# Patient Record
Sex: Female | Born: 1945
Health system: Southern US, Community
[De-identification: ages and names within clinical notes are randomized; demographics above are authoritative.]

## PROBLEM LIST (undated history)

## (undated) DIAGNOSIS — F32A Depression, unspecified: Secondary | ICD-10-CM

## (undated) DIAGNOSIS — R29898 Other symptoms and signs involving the musculoskeletal system: Secondary | ICD-10-CM

## (undated) DIAGNOSIS — J45909 Unspecified asthma, uncomplicated: Secondary | ICD-10-CM

## (undated) DIAGNOSIS — M1990 Unspecified osteoarthritis, unspecified site: Secondary | ICD-10-CM

## (undated) DIAGNOSIS — I82409 Acute embolism and thrombosis of unspecified deep veins of unspecified lower extremity: Secondary | ICD-10-CM

## (undated) DIAGNOSIS — K219 Gastro-esophageal reflux disease without esophagitis: Secondary | ICD-10-CM

## (undated) DIAGNOSIS — C801 Malignant (primary) neoplasm, unspecified: Secondary | ICD-10-CM

## (undated) DIAGNOSIS — I10 Essential (primary) hypertension: Secondary | ICD-10-CM

## (undated) DIAGNOSIS — F329 Major depressive disorder, single episode, unspecified: Secondary | ICD-10-CM

## (undated) HISTORY — PX: FOOT SURGERY: SHX648

## (undated) HISTORY — DX: Essential (primary) hypertension: I10

## (undated) HISTORY — PX: PELVIC FRACTURE SURGERY: SHX119

## (undated) HISTORY — PX: CHOLECYSTECTOMY: SHX55

## (undated) HISTORY — DX: Major depressive disorder, single episode, unspecified: F32.9

## (undated) HISTORY — DX: Depression, unspecified: F32.A

## (undated) HISTORY — DX: Unspecified asthma, uncomplicated: J45.909

## (undated) HISTORY — DX: Malignant (primary) neoplasm, unspecified: C80.1

---

## 2008-08-13 ENCOUNTER — Ambulatory Visit: Payer: Self-pay | Admitting: Internal Medicine

## 2010-09-13 ENCOUNTER — Other Ambulatory Visit: Payer: Self-pay | Admitting: *Deleted

## 2013-01-02 DIAGNOSIS — M189 Osteoarthritis of first carpometacarpal joint, unspecified: Secondary | ICD-10-CM | POA: Insufficient documentation

## 2013-03-30 DIAGNOSIS — R0602 Shortness of breath: Secondary | ICD-10-CM | POA: Insufficient documentation

## 2013-03-30 DIAGNOSIS — C541 Malignant neoplasm of endometrium: Secondary | ICD-10-CM | POA: Insufficient documentation

## 2013-04-13 DIAGNOSIS — I10 Essential (primary) hypertension: Secondary | ICD-10-CM | POA: Insufficient documentation

## 2013-04-30 DIAGNOSIS — I82409 Acute embolism and thrombosis of unspecified deep veins of unspecified lower extremity: Secondary | ICD-10-CM

## 2013-04-30 HISTORY — DX: Acute embolism and thrombosis of unspecified deep veins of unspecified lower extremity: I82.409

## 2013-06-01 DIAGNOSIS — Z0001 Encounter for general adult medical examination with abnormal findings: Secondary | ICD-10-CM | POA: Insufficient documentation

## 2013-11-02 DIAGNOSIS — T451X5A Adverse effect of antineoplastic and immunosuppressive drugs, initial encounter: Secondary | ICD-10-CM

## 2013-11-02 DIAGNOSIS — G620 Drug-induced polyneuropathy: Secondary | ICD-10-CM | POA: Insufficient documentation

## 2013-12-28 DIAGNOSIS — G8929 Other chronic pain: Secondary | ICD-10-CM | POA: Insufficient documentation

## 2013-12-28 DIAGNOSIS — M5127 Other intervertebral disc displacement, lumbosacral region: Secondary | ICD-10-CM | POA: Insufficient documentation

## 2013-12-28 DIAGNOSIS — M5441 Lumbago with sciatica, right side: Secondary | ICD-10-CM

## 2013-12-28 DIAGNOSIS — M5442 Lumbago with sciatica, left side: Secondary | ICD-10-CM

## 2014-03-29 DIAGNOSIS — I82412 Acute embolism and thrombosis of left femoral vein: Secondary | ICD-10-CM | POA: Insufficient documentation

## 2014-04-22 ENCOUNTER — Ambulatory Visit: Payer: Self-pay | Admitting: Internal Medicine

## 2014-04-30 HISTORY — PX: ABCESS DRAINAGE: SHX399

## 2014-05-06 DIAGNOSIS — Z79899 Other long term (current) drug therapy: Secondary | ICD-10-CM | POA: Diagnosis not present

## 2014-05-06 DIAGNOSIS — I82402 Acute embolism and thrombosis of unspecified deep veins of left lower extremity: Secondary | ICD-10-CM | POA: Diagnosis not present

## 2014-05-06 DIAGNOSIS — Z8542 Personal history of malignant neoplasm of other parts of uterus: Secondary | ICD-10-CM | POA: Diagnosis not present

## 2014-05-06 DIAGNOSIS — R509 Fever, unspecified: Secondary | ICD-10-CM | POA: Diagnosis not present

## 2014-05-06 DIAGNOSIS — Z6841 Body Mass Index (BMI) 40.0 and over, adult: Secondary | ICD-10-CM | POA: Diagnosis not present

## 2014-05-06 DIAGNOSIS — F329 Major depressive disorder, single episode, unspecified: Secondary | ICD-10-CM | POA: Diagnosis not present

## 2014-05-06 DIAGNOSIS — R112 Nausea with vomiting, unspecified: Secondary | ICD-10-CM | POA: Diagnosis not present

## 2014-05-06 DIAGNOSIS — R109 Unspecified abdominal pain: Secondary | ICD-10-CM | POA: Diagnosis not present

## 2014-05-06 DIAGNOSIS — I10 Essential (primary) hypertension: Secondary | ICD-10-CM | POA: Diagnosis not present

## 2014-05-06 DIAGNOSIS — K5720 Diverticulitis of large intestine with perforation and abscess without bleeding: Secondary | ICD-10-CM | POA: Diagnosis not present

## 2014-05-06 DIAGNOSIS — Z4682 Encounter for fitting and adjustment of non-vascular catheter: Secondary | ICD-10-CM | POA: Diagnosis not present

## 2014-05-06 DIAGNOSIS — N739 Female pelvic inflammatory disease, unspecified: Secondary | ICD-10-CM | POA: Diagnosis not present

## 2014-05-06 DIAGNOSIS — K5780 Diverticulitis of intestine, part unspecified, with perforation and abscess without bleeding: Secondary | ICD-10-CM | POA: Diagnosis not present

## 2014-05-07 DIAGNOSIS — N739 Female pelvic inflammatory disease, unspecified: Secondary | ICD-10-CM | POA: Insufficient documentation

## 2014-05-13 DIAGNOSIS — I82402 Acute embolism and thrombosis of unspecified deep veins of left lower extremity: Secondary | ICD-10-CM | POA: Diagnosis not present

## 2014-05-13 DIAGNOSIS — N739 Female pelvic inflammatory disease, unspecified: Secondary | ICD-10-CM | POA: Diagnosis not present

## 2014-05-13 DIAGNOSIS — I10 Essential (primary) hypertension: Secondary | ICD-10-CM | POA: Diagnosis not present

## 2014-05-13 DIAGNOSIS — Z8542 Personal history of malignant neoplasm of other parts of uterus: Secondary | ICD-10-CM | POA: Diagnosis not present

## 2014-05-27 DIAGNOSIS — Z6841 Body Mass Index (BMI) 40.0 and over, adult: Secondary | ICD-10-CM | POA: Diagnosis not present

## 2014-05-27 DIAGNOSIS — K5720 Diverticulitis of large intestine with perforation and abscess without bleeding: Secondary | ICD-10-CM | POA: Diagnosis not present

## 2014-05-27 DIAGNOSIS — I10 Essential (primary) hypertension: Secondary | ICD-10-CM | POA: Diagnosis not present

## 2014-05-27 DIAGNOSIS — F329 Major depressive disorder, single episode, unspecified: Secondary | ICD-10-CM | POA: Diagnosis not present

## 2014-05-27 DIAGNOSIS — R9431 Abnormal electrocardiogram [ECG] [EKG]: Secondary | ICD-10-CM | POA: Diagnosis not present

## 2014-05-27 DIAGNOSIS — K5780 Diverticulitis of intestine, part unspecified, with perforation and abscess without bleeding: Secondary | ICD-10-CM | POA: Diagnosis not present

## 2014-05-27 DIAGNOSIS — Z4682 Encounter for fitting and adjustment of non-vascular catheter: Secondary | ICD-10-CM | POA: Diagnosis not present

## 2014-06-02 ENCOUNTER — Ambulatory Visit: Payer: Self-pay

## 2014-06-02 DIAGNOSIS — I10 Essential (primary) hypertension: Secondary | ICD-10-CM | POA: Diagnosis not present

## 2014-06-02 DIAGNOSIS — M79605 Pain in left leg: Secondary | ICD-10-CM | POA: Diagnosis not present

## 2014-06-02 DIAGNOSIS — I82402 Acute embolism and thrombosis of unspecified deep veins of left lower extremity: Secondary | ICD-10-CM | POA: Diagnosis not present

## 2014-06-02 DIAGNOSIS — I739 Peripheral vascular disease, unspecified: Secondary | ICD-10-CM | POA: Diagnosis not present

## 2014-06-02 DIAGNOSIS — M7989 Other specified soft tissue disorders: Secondary | ICD-10-CM | POA: Diagnosis not present

## 2014-06-02 DIAGNOSIS — M79662 Pain in left lower leg: Secondary | ICD-10-CM | POA: Diagnosis not present

## 2014-06-02 DIAGNOSIS — I80292 Phlebitis and thrombophlebitis of other deep vessels of left lower extremity: Secondary | ICD-10-CM | POA: Diagnosis not present

## 2014-06-02 DIAGNOSIS — C55 Malignant neoplasm of uterus, part unspecified: Secondary | ICD-10-CM | POA: Diagnosis not present

## 2014-06-10 DIAGNOSIS — R600 Localized edema: Secondary | ICD-10-CM | POA: Diagnosis not present

## 2014-06-10 DIAGNOSIS — M79605 Pain in left leg: Secondary | ICD-10-CM | POA: Diagnosis not present

## 2014-06-24 DIAGNOSIS — C55 Malignant neoplasm of uterus, part unspecified: Secondary | ICD-10-CM | POA: Diagnosis not present

## 2014-06-24 DIAGNOSIS — R600 Localized edema: Secondary | ICD-10-CM | POA: Diagnosis not present

## 2014-06-24 DIAGNOSIS — M79605 Pain in left leg: Secondary | ICD-10-CM | POA: Diagnosis not present

## 2014-06-24 DIAGNOSIS — M792 Neuralgia and neuritis, unspecified: Secondary | ICD-10-CM | POA: Diagnosis not present

## 2014-06-24 DIAGNOSIS — I10 Essential (primary) hypertension: Secondary | ICD-10-CM | POA: Diagnosis not present

## 2014-06-24 DIAGNOSIS — I82402 Acute embolism and thrombosis of unspecified deep veins of left lower extremity: Secondary | ICD-10-CM | POA: Diagnosis not present

## 2014-07-09 DIAGNOSIS — Z8542 Personal history of malignant neoplasm of other parts of uterus: Secondary | ICD-10-CM | POA: Diagnosis not present

## 2014-07-09 DIAGNOSIS — C541 Malignant neoplasm of endometrium: Secondary | ICD-10-CM | POA: Diagnosis not present

## 2014-07-09 DIAGNOSIS — Z483 Aftercare following surgery for neoplasm: Secondary | ICD-10-CM | POA: Diagnosis not present

## 2014-08-23 DIAGNOSIS — I80292 Phlebitis and thrombophlebitis of other deep vessels of left lower extremity: Secondary | ICD-10-CM | POA: Diagnosis not present

## 2014-08-23 DIAGNOSIS — R109 Unspecified abdominal pain: Secondary | ICD-10-CM | POA: Diagnosis not present

## 2014-08-23 DIAGNOSIS — K5732 Diverticulitis of large intestine without perforation or abscess without bleeding: Secondary | ICD-10-CM | POA: Diagnosis not present

## 2014-08-23 DIAGNOSIS — I10 Essential (primary) hypertension: Secondary | ICD-10-CM | POA: Diagnosis not present

## 2014-08-23 DIAGNOSIS — C55 Malignant neoplasm of uterus, part unspecified: Secondary | ICD-10-CM | POA: Diagnosis not present

## 2014-08-31 DIAGNOSIS — D125 Benign neoplasm of sigmoid colon: Secondary | ICD-10-CM | POA: Diagnosis not present

## 2014-08-31 DIAGNOSIS — F329 Major depressive disorder, single episode, unspecified: Secondary | ICD-10-CM | POA: Diagnosis not present

## 2014-08-31 DIAGNOSIS — Z1211 Encounter for screening for malignant neoplasm of colon: Secondary | ICD-10-CM | POA: Diagnosis not present

## 2014-08-31 DIAGNOSIS — Z6841 Body Mass Index (BMI) 40.0 and over, adult: Secondary | ICD-10-CM | POA: Diagnosis not present

## 2014-08-31 DIAGNOSIS — K219 Gastro-esophageal reflux disease without esophagitis: Secondary | ICD-10-CM | POA: Diagnosis not present

## 2014-08-31 DIAGNOSIS — K5730 Diverticulosis of large intestine without perforation or abscess without bleeding: Secondary | ICD-10-CM | POA: Diagnosis not present

## 2014-08-31 DIAGNOSIS — I10 Essential (primary) hypertension: Secondary | ICD-10-CM | POA: Diagnosis not present

## 2014-08-31 DIAGNOSIS — R0602 Shortness of breath: Secondary | ICD-10-CM | POA: Diagnosis not present

## 2014-08-31 DIAGNOSIS — D369 Benign neoplasm, unspecified site: Secondary | ICD-10-CM | POA: Diagnosis not present

## 2014-09-01 DIAGNOSIS — M40205 Unspecified kyphosis, thoracolumbar region: Secondary | ICD-10-CM | POA: Diagnosis not present

## 2014-09-01 DIAGNOSIS — M8580 Other specified disorders of bone density and structure, unspecified site: Secondary | ICD-10-CM | POA: Diagnosis not present

## 2014-09-01 DIAGNOSIS — R05 Cough: Secondary | ICD-10-CM | POA: Diagnosis not present

## 2014-09-03 DIAGNOSIS — K5732 Diverticulitis of large intestine without perforation or abscess without bleeding: Secondary | ICD-10-CM | POA: Diagnosis not present

## 2014-09-03 DIAGNOSIS — I10 Essential (primary) hypertension: Secondary | ICD-10-CM | POA: Diagnosis not present

## 2014-09-03 DIAGNOSIS — R0602 Shortness of breath: Secondary | ICD-10-CM | POA: Diagnosis not present

## 2014-09-03 DIAGNOSIS — J189 Pneumonia, unspecified organism: Secondary | ICD-10-CM | POA: Diagnosis not present

## 2014-09-03 DIAGNOSIS — R05 Cough: Secondary | ICD-10-CM | POA: Diagnosis not present

## 2014-10-08 DIAGNOSIS — Z452 Encounter for adjustment and management of vascular access device: Secondary | ICD-10-CM | POA: Diagnosis not present

## 2014-10-08 DIAGNOSIS — C541 Malignant neoplasm of endometrium: Secondary | ICD-10-CM | POA: Diagnosis not present

## 2014-10-11 DIAGNOSIS — R10814 Left lower quadrant abdominal tenderness: Secondary | ICD-10-CM | POA: Diagnosis not present

## 2014-10-11 DIAGNOSIS — K5732 Diverticulitis of large intestine without perforation or abscess without bleeding: Secondary | ICD-10-CM | POA: Diagnosis not present

## 2014-10-11 DIAGNOSIS — I82402 Acute embolism and thrombosis of unspecified deep veins of left lower extremity: Secondary | ICD-10-CM | POA: Diagnosis not present

## 2014-10-11 DIAGNOSIS — R7301 Impaired fasting glucose: Secondary | ICD-10-CM | POA: Diagnosis not present

## 2014-10-11 DIAGNOSIS — F411 Generalized anxiety disorder: Secondary | ICD-10-CM | POA: Diagnosis not present

## 2014-10-11 DIAGNOSIS — I10 Essential (primary) hypertension: Secondary | ICD-10-CM | POA: Diagnosis not present

## 2014-11-15 DIAGNOSIS — C541 Malignant neoplasm of endometrium: Secondary | ICD-10-CM | POA: Diagnosis not present

## 2014-11-15 DIAGNOSIS — T451X5A Adverse effect of antineoplastic and immunosuppressive drugs, initial encounter: Secondary | ICD-10-CM | POA: Diagnosis not present

## 2014-11-15 DIAGNOSIS — G620 Drug-induced polyneuropathy: Secondary | ICD-10-CM | POA: Diagnosis not present

## 2014-11-15 DIAGNOSIS — N739 Female pelvic inflammatory disease, unspecified: Secondary | ICD-10-CM | POA: Diagnosis not present

## 2014-11-15 DIAGNOSIS — Z79899 Other long term (current) drug therapy: Secondary | ICD-10-CM | POA: Diagnosis not present

## 2014-11-19 DIAGNOSIS — Z9049 Acquired absence of other specified parts of digestive tract: Secondary | ICD-10-CM | POA: Diagnosis not present

## 2014-11-19 DIAGNOSIS — R1032 Left lower quadrant pain: Secondary | ICD-10-CM | POA: Diagnosis not present

## 2014-11-19 DIAGNOSIS — Z90710 Acquired absence of both cervix and uterus: Secondary | ICD-10-CM | POA: Diagnosis not present

## 2014-11-19 DIAGNOSIS — M479 Spondylosis, unspecified: Secondary | ICD-10-CM | POA: Diagnosis not present

## 2014-11-19 DIAGNOSIS — K429 Umbilical hernia without obstruction or gangrene: Secondary | ICD-10-CM | POA: Diagnosis not present

## 2014-11-19 DIAGNOSIS — C541 Malignant neoplasm of endometrium: Secondary | ICD-10-CM | POA: Diagnosis not present

## 2014-11-19 DIAGNOSIS — N200 Calculus of kidney: Secondary | ICD-10-CM | POA: Diagnosis not present

## 2014-11-19 DIAGNOSIS — N281 Cyst of kidney, acquired: Secondary | ICD-10-CM | POA: Diagnosis not present

## 2015-01-13 ENCOUNTER — Other Ambulatory Visit: Payer: Self-pay | Admitting: Nurse Practitioner

## 2015-01-13 ENCOUNTER — Ambulatory Visit
Admission: RE | Admit: 2015-01-13 | Discharge: 2015-01-13 | Disposition: A | Discharge status: Home / Self Care | Payer: Commercial Managed Care - HMO | Source: Ambulatory Visit | Attending: Nurse Practitioner | Admitting: Nurse Practitioner

## 2015-01-13 DIAGNOSIS — C55 Malignant neoplasm of uterus, part unspecified: Secondary | ICD-10-CM | POA: Diagnosis not present

## 2015-01-13 DIAGNOSIS — R0602 Shortness of breath: Secondary | ICD-10-CM | POA: Diagnosis not present

## 2015-01-13 DIAGNOSIS — R0781 Pleurodynia: Secondary | ICD-10-CM

## 2015-01-13 DIAGNOSIS — M25511 Pain in right shoulder: Secondary | ICD-10-CM

## 2015-01-13 DIAGNOSIS — S2341XA Sprain of ribs, initial encounter: Secondary | ICD-10-CM | POA: Diagnosis not present

## 2015-01-13 DIAGNOSIS — M791 Myalgia: Secondary | ICD-10-CM | POA: Diagnosis not present

## 2015-01-13 DIAGNOSIS — Z043 Encounter for examination and observation following other accident: Secondary | ICD-10-CM | POA: Diagnosis not present

## 2015-01-13 DIAGNOSIS — I10 Essential (primary) hypertension: Secondary | ICD-10-CM | POA: Diagnosis not present

## 2015-01-13 DIAGNOSIS — R079 Chest pain, unspecified: Secondary | ICD-10-CM | POA: Diagnosis not present

## 2015-01-13 DIAGNOSIS — R0789 Other chest pain: Secondary | ICD-10-CM | POA: Insufficient documentation

## 2015-01-21 DIAGNOSIS — C541 Malignant neoplasm of endometrium: Secondary | ICD-10-CM | POA: Diagnosis not present

## 2015-01-21 DIAGNOSIS — Z8542 Personal history of malignant neoplasm of other parts of uterus: Secondary | ICD-10-CM | POA: Diagnosis not present

## 2015-01-21 DIAGNOSIS — Z08 Encounter for follow-up examination after completed treatment for malignant neoplasm: Secondary | ICD-10-CM | POA: Diagnosis not present

## 2015-01-21 DIAGNOSIS — Z9221 Personal history of antineoplastic chemotherapy: Secondary | ICD-10-CM | POA: Diagnosis not present

## 2015-01-21 DIAGNOSIS — Z923 Personal history of irradiation: Secondary | ICD-10-CM | POA: Diagnosis not present

## 2015-01-21 DIAGNOSIS — K5792 Diverticulitis of intestine, part unspecified, without perforation or abscess without bleeding: Secondary | ICD-10-CM | POA: Diagnosis not present

## 2015-03-07 ENCOUNTER — Other Ambulatory Visit: Payer: Self-pay | Admitting: Nurse Practitioner

## 2015-03-07 DIAGNOSIS — I80292 Phlebitis and thrombophlebitis of other deep vessels of left lower extremity: Secondary | ICD-10-CM | POA: Diagnosis not present

## 2015-03-07 DIAGNOSIS — F411 Generalized anxiety disorder: Secondary | ICD-10-CM | POA: Diagnosis not present

## 2015-03-07 DIAGNOSIS — Z0001 Encounter for general adult medical examination with abnormal findings: Secondary | ICD-10-CM | POA: Diagnosis not present

## 2015-03-07 DIAGNOSIS — F329 Major depressive disorder, single episode, unspecified: Secondary | ICD-10-CM | POA: Diagnosis not present

## 2015-03-07 DIAGNOSIS — M545 Low back pain: Secondary | ICD-10-CM | POA: Diagnosis not present

## 2015-03-07 DIAGNOSIS — M7989 Other specified soft tissue disorders: Secondary | ICD-10-CM

## 2015-03-07 DIAGNOSIS — M79605 Pain in left leg: Secondary | ICD-10-CM

## 2015-03-07 DIAGNOSIS — Z0000 Encounter for general adult medical examination without abnormal findings: Secondary | ICD-10-CM | POA: Diagnosis not present

## 2015-03-09 ENCOUNTER — Other Ambulatory Visit: Payer: Self-pay | Admitting: Nurse Practitioner

## 2015-03-09 ENCOUNTER — Ambulatory Visit
Admission: RE | Admit: 2015-03-09 | Discharge: 2015-03-09 | Disposition: A | Discharge status: Home / Self Care | Payer: Commercial Managed Care - HMO | Source: Ambulatory Visit | Attending: Nurse Practitioner | Admitting: Nurse Practitioner

## 2015-03-09 ENCOUNTER — Ambulatory Visit
Admission: RE | Admit: 2015-03-09 | Payer: Commercial Managed Care - HMO | Source: Ambulatory Visit | Admitting: Nurse Practitioner

## 2015-03-09 DIAGNOSIS — M25552 Pain in left hip: Secondary | ICD-10-CM | POA: Diagnosis not present

## 2015-03-09 DIAGNOSIS — M8580 Other specified disorders of bone density and structure, unspecified site: Secondary | ICD-10-CM | POA: Diagnosis not present

## 2015-03-09 DIAGNOSIS — M545 Low back pain: Secondary | ICD-10-CM

## 2015-03-09 DIAGNOSIS — M47816 Spondylosis without myelopathy or radiculopathy, lumbar region: Secondary | ICD-10-CM | POA: Insufficient documentation

## 2015-03-09 DIAGNOSIS — M5136 Other intervertebral disc degeneration, lumbar region: Secondary | ICD-10-CM | POA: Diagnosis not present

## 2015-03-10 ENCOUNTER — Ambulatory Visit
Admission: RE | Admit: 2015-03-10 | Discharge: 2015-03-10 | Disposition: A | Discharge status: Home / Self Care | Payer: Commercial Managed Care - HMO | Source: Ambulatory Visit | Attending: Nurse Practitioner | Admitting: Nurse Practitioner

## 2015-03-10 DIAGNOSIS — M7989 Other specified soft tissue disorders: Secondary | ICD-10-CM | POA: Diagnosis not present

## 2015-03-10 DIAGNOSIS — M79605 Pain in left leg: Secondary | ICD-10-CM | POA: Insufficient documentation

## 2015-03-16 ENCOUNTER — Other Ambulatory Visit: Payer: Self-pay | Admitting: Internal Medicine

## 2015-03-16 DIAGNOSIS — M25552 Pain in left hip: Secondary | ICD-10-CM

## 2015-04-06 ENCOUNTER — Ambulatory Visit
Admission: RE | Admit: 2015-04-06 | Discharge: 2015-04-06 | Disposition: A | Discharge status: Home / Self Care | Payer: Commercial Managed Care - HMO | Source: Ambulatory Visit | Attending: Internal Medicine | Admitting: Internal Medicine

## 2015-04-06 DIAGNOSIS — M25552 Pain in left hip: Secondary | ICD-10-CM | POA: Diagnosis not present

## 2015-04-06 DIAGNOSIS — M899 Disorder of bone, unspecified: Secondary | ICD-10-CM | POA: Diagnosis not present

## 2015-04-06 DIAGNOSIS — M461 Sacroiliitis, not elsewhere classified: Secondary | ICD-10-CM | POA: Insufficient documentation

## 2015-04-21 DIAGNOSIS — M25552 Pain in left hip: Secondary | ICD-10-CM | POA: Diagnosis not present

## 2015-04-21 DIAGNOSIS — M461 Sacroiliitis, not elsewhere classified: Secondary | ICD-10-CM | POA: Diagnosis not present

## 2015-04-21 DIAGNOSIS — M7062 Trochanteric bursitis, left hip: Secondary | ICD-10-CM | POA: Diagnosis not present

## 2015-04-29 DIAGNOSIS — M899 Disorder of bone, unspecified: Secondary | ICD-10-CM | POA: Diagnosis not present

## 2015-04-29 DIAGNOSIS — M461 Sacroiliitis, not elsewhere classified: Secondary | ICD-10-CM | POA: Diagnosis not present

## 2015-05-12 DIAGNOSIS — N390 Urinary tract infection, site not specified: Secondary | ICD-10-CM | POA: Diagnosis not present

## 2015-05-12 DIAGNOSIS — I10 Essential (primary) hypertension: Secondary | ICD-10-CM | POA: Diagnosis not present

## 2015-05-12 DIAGNOSIS — F329 Major depressive disorder, single episode, unspecified: Secondary | ICD-10-CM | POA: Diagnosis not present

## 2015-05-12 DIAGNOSIS — M545 Low back pain: Secondary | ICD-10-CM | POA: Diagnosis not present

## 2015-05-13 ENCOUNTER — Other Ambulatory Visit: Payer: Self-pay | Admitting: *Deleted
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
## 2015-05-17 DIAGNOSIS — M545 Low back pain: Secondary | ICD-10-CM | POA: Diagnosis not present

## 2015-05-17 DIAGNOSIS — M79605 Pain in left leg: Secondary | ICD-10-CM | POA: Diagnosis not present

## 2015-05-17 DIAGNOSIS — N393 Stress incontinence (female) (male): Secondary | ICD-10-CM | POA: Diagnosis not present

## 2015-05-17 DIAGNOSIS — M461 Sacroiliitis, not elsewhere classified: Secondary | ICD-10-CM | POA: Diagnosis not present

## 2015-05-17 DIAGNOSIS — G8929 Other chronic pain: Secondary | ICD-10-CM | POA: Diagnosis not present

## 2015-05-18 ENCOUNTER — Other Ambulatory Visit: Payer: Self-pay | Admitting: *Deleted

## 2015-05-18 NOTE — Patient Outreach (Signed)
[place] [facility]) Care Management  05/18/2015  Teree Frankenberg 08-22-1945 [id number]  Subjective: Telephone to patient's home number, spoke with patient, and HIPAA verified.   States she is currently at the pharmacy picking up some medication and requested call back tomorrow.  Objective: Per Epic case review: Patient has not had any inpatient admits or ED visits in 2016.   Assessment: Referral received on 05/12/15. Referral source: [name] at [facility]. States patient has chronic pain and depression. Patient with recent grief and loss due to husband's death in March 10, 2015. States the patient reports that she is confused about her options for care, risks and benefits. Patient wants to manage pain and participate in PT. Patient has barriers and does not know what to do. Services requested: [place]. Patient in agreement to continue to receive [place] Management services.   Plan: RNCM will call patient back within 3 business days to initiate case management assessment (review pain management, follow up on pain management MD appointment and review medications).    Arline Ketter H. Annia Friendly, BSN, [place] Management [facility] Telephonic CM Phone: [phone] Fax: [phone]

## 2015-05-19 ENCOUNTER — Ambulatory Visit: Payer: Self-pay | Admitting: *Deleted

## 2015-05-20 ENCOUNTER — Other Ambulatory Visit: Payer: Self-pay | Admitting: *Deleted

## 2015-05-20 NOTE — Patient Outreach (Signed)
[place] [facility]) Care Management  05/20/2015  Diana Bernard November 09, 1945 [id number]   Subjective: Telephone call to patient's home phone number, spoke with patient, and HIPAA verified.   Patient states she is doing ok today and having a better day than yesterday.   Patient states her visit with the pain management MD went ok and she is willing to try the MD's suggestions.  States her lower back still hurts and can reach an 8 pain rating at times.   Patient states pain is relieved with pain medications, limiting her body movement, and bending at her knees.  States pain management MD has also prescribed some pain cream and patches (TENF).  Patient states she could only afford to fill half of the pain cream prescription for now.  States she does not want to take any stronger pain medicines because she does not want get addicted.   RNCM advised patient of pain management strategies, treating pain early, and maintaining pain schedule based on pain rating.   Patient voiced understanding and states the pain medication does not last long.  Patient states she is comfortable with her pain treatment regimen and will continue to evaluate her pain management. Patient states she is only able to walk short distances. States she has an appointment in March 2017 with a pelvic therapist and her gynecologist.  Patient states she is getting disgusted with her MDs because they kept telling her something different.   RNCM encouraged patient to continue to seek understanding, clarification and how this impacts health outcomes.   Patient states she would like to receive information on Advanced Directives.  Patient in agreement to continue to receive [place] Management services.    Objective: Per Epic case review: Patient has not had any inpatient admits or ED visits in 2016.   Assessment: Referral received on 05/12/15. Referral source: [name] at [facility]. States patient has  chronic pain and depression. Patient with recent grief and loss due to husband's death in 2015/03/13. States the patient reports that she is confused about her options for care, risks and benefits. Patient wants to manage pain and participate in PT. Patient has barriers and does not know what to do. Services requested: [place]. Patient in agreement to continue to receive [place] Management services.   Plan: RNCM will call patient back within 2 weeks to continue case management assessment (review  medications, medical history, tobacco/ smoking, falls/ depression and functional status). RNCM will send patient information on Advanced Directives within 2 weeks and follow up on receipt.    Zion Ta H. Annia Friendly, BSN, [place] Management [facility] Telephonic CM Phone: [phone] Fax: [phone]

## 2015-05-31 ENCOUNTER — Other Ambulatory Visit: Payer: Self-pay | Admitting: *Deleted

## 2015-06-01 ENCOUNTER — Encounter: Payer: Self-pay | Admitting: *Deleted

## 2015-06-01 NOTE — Patient Outreach (Signed)
Diana Bernard) Care Management  [place]  05/31/15  Diana Bernard 05-26-1945 [id number]  Subjective:  Telephone call to patient's home number, spoke with patient and HIPAA verified.   Patient states she is doing ok.  States her back and stomach are still hurting.   Patient states the pain never completely goes away.  States she is managing the pain with alternating pain medications between Tramadol and Ibuprofen.   States does not want to try any stronger medications at this time, is ok with current pain management regimen,  and is getting approximately 4 hours of relief with the current regimen.   RNCM encouraged patient to obtain sooner pain management MD follow up appointment if pain increased or does not decrease.   States the pain is worse when sitting or driving for long periods of time.  RNCM encouraged patient to increase mobility and change positions often.   Patient voiced understanding and is in agreement with RNCM's suggestions.  Patient states her next appointment with pain management MD (Dr. Mike Gip [facility])  is  06/21/15 at [facility] .   Patient feels her pain in stomach may be due to a left side stomach abscess drain that was placed and removed in July of 2016.   Patient states she is planning to schedule a sooner appointment with her oncologist Dr. Benard Halsted at [facility] for follow up on back and stomach pain.  Patient states she wants to verify with Dr. Benard Halsted that her cancer is still in remission.  Patient states she is also feeling very overwhelmed and disorganized due to recent life events (husband died recently).   States she missed her MRI appointment and has rescheduled it for 06/08/15 at 1:15pm because she forgot about it.   Patient in agreement to receive [facility] patient calendar to assist with organization of medical appointments.   Patient states she has had some bladder leakage in the past and is planning to discuss with MD on next office  visit.   Patient states she is currently managing this issue.  RNCM educated patient on possible benefits of kegel exercises with this issue.  Patient is in agreement to continue to receive [place] Management services.    Objective: Per Epic case review: Patient has not had any inpatient admits or ED visits in 2016.   Current Medications:  No current outpatient prescriptions on file.   No current facility-administered medications for this visit.    Functional Status:  In your present state of health, do you have any difficulty performing the following activities: 06/01/2015 05/31/2015  Hearing? - N  Vision? Y Y  Difficulty concentrating or making decisions? - Y  Walking or climbing stairs? - Y  Dressing or bathing? - -  Doing errands, shopping? - Facilities manager and eating ? - N  Using the Toilet? - N  In the past six months, have you accidently leaked urine? Y Y  Do you have problems with loss of bowel control? - N  Managing your Medications? - -  Managing your Finances? - -  Housekeeping or managing your Housekeeping? - -    Fall/Depression Screening: PHQ 2/9 Scores 05/31/2015  PHQ - 2 Score 1   Fall Risk  05/31/2015  Falls in the past year? Yes  Number falls in past yr: 1  Injury with Fall? No  Risk for fall due to : Impaired mobility  Follow up Education provided    [facility] CM Care Plan Problem  One        Most Recent Value   Care Plan Problem One  Patient is experiencing chronic back and stomach pain.    Role Documenting the Problem One  Care Management Telephonic [place] for Problem One  Active   THN Long Term Goal (31-90 days)  Patient will not have any inpatient admissions for pain management within  the next 31 days.    THN Long Term Goal Start Date  05/31/15   Interventions for Problem One Long Term Goal  RNCM educated patient on pain management strategies.     THN CM Short Term Goal #1 (0-30 days)  Patient will attend pain management follow up MD  appointment within 30 days.    THN CM Short Term Goal #1 Start Date  05/31/15   Interventions for Short Term Goal #1  RNCM educated patient to schedule sooner follow up appointment with pain management MD if pain increasing and / or does not decrease within  2 weeks.    THN CM Short Term Goal #2 (0-30 days)  Patient will schedule follow up appointment with oncologist  within the next 30 days.    THN CM Short Term Goal #2 Start Date  05/31/15   Interventions for Short Term Goal #2  RNCM encouraged patient to follow up with oncologist regarding increased back and stomach pain.      THN CM Care Plan Problem Two        Most Recent Value   Care Plan Problem Two  Patient is feeling disorganized, overwhelmed and missed recent medical follow up appointment.    Role Documenting the Problem Two  Care Management Telephonic Coordinator   Care Plan for Problem Two  Active   THN CM Short Term Goal #1 (0-30 days)  Patient will verbalize that is she is feeling less overwhelmed and more organized with following up on MD appointments within the next 30 days.   THN CM Short Term Goal #1 Start Date  05/31/15   Interventions for Short Term Goal #2   RNCM educated patient on [place] Management resources and will send [facility] patient calendar.        Assessment: Referral received on 05/12/15. Referral source: [name] at [facility]. States patient has chronic pain and depression. Patient with recent grief and loss due to husband's death in March 23, 2015. States the patient reports that she is confused about her options for care, risks and benefits. Patient wants to manage pain and participate in PT. Patient has barriers and does not know what to do. Services requested: [place]. Patient in agreement to continue to receive [place] Management services.  Patient will be followed by telephonic  [facility] RNCM for pain management care coordination, education and community resources.    Plan: RNCM will send MD barrier letter, patient's care plan and initial assessment to patient's primary MD.  [facility] will send patient information on Advanced Directives, Welcome Packet, within 1 week and follow up on receipt.  RNCM will call patient back within 1 week to continue case management assessment (review medications, medical history, tobacco/ smoking, and nutrition).  RNCM will follow up with patient to assess need for pain medication (cream) financial resource within 1 week.   RNCM will follow up with patient regarding upcoming pain management appointment and MRI results within 3 weeks. RNCM will send patient information on Advanced Directives within 2 weeks and follow up on receipt.    Jakyra Kenealy  Morton Amy RN, BSN, [place] Telephonic CM Phone: [phone] Fax: [phone]

## 2015-06-03 ENCOUNTER — Encounter: Payer: Self-pay | Admitting: *Deleted

## 2015-06-03 NOTE — Progress Notes (Signed)
This encounter was created in error - please disregard.

## 2015-06-07 ENCOUNTER — Encounter: Payer: Self-pay | Admitting: *Deleted

## 2015-06-07 ENCOUNTER — Other Ambulatory Visit: Payer: Self-pay | Admitting: *Deleted

## 2015-06-07 VITALS — Wt 240.0 lb

## 2015-06-07 DIAGNOSIS — M47819 Spondylosis without myelopathy or radiculopathy, site unspecified: Secondary | ICD-10-CM | POA: Diagnosis not present

## 2015-06-07 DIAGNOSIS — M47816 Spondylosis without myelopathy or radiculopathy, lumbar region: Secondary | ICD-10-CM | POA: Diagnosis not present

## 2015-06-07 DIAGNOSIS — M4856XA Collapsed vertebra, not elsewhere classified, lumbar region, initial encounter for fracture: Secondary | ICD-10-CM | POA: Diagnosis not present

## 2015-06-07 DIAGNOSIS — R1012 Left upper quadrant pain: Secondary | ICD-10-CM

## 2015-06-07 NOTE — Patient Outreach (Addendum)
[place] [facility]) Care Management  [place]  06/07/2015   Diana Bernard July 19, 1945 [id number]  Subjective:  Telephone call to patient, spoke with patient, and HIPAA verified.   States she is doing pretty good today and much better than yesterday.   States she had an emotional day yesterday dealing with her late husband's estate.  States she had stop taking her Prozac daily recently because is was making her too drowsy.   States she resumed taking it today as prescribed.   RNCM educated patient on medication adherence, importance of antidepressant tapering, and to report any side effects to the MD.   Patient in agreement and states she will follow up with the MD in the future instead of making self medication adjustments.   Patient states her back pain is about the same, 5 pain rating, and is learning to identify pain triggers.  States lifting things like a case of water, causes increase pain.   States she has modified her lifting to buying only  gallon containers of water and having others to lift the case of water for her if needed.   RNCM educated patient on strategies to use once pain triggers identified, modification to lifting and monitor her activity endurance.  Patient voices understanding and is in agreement to try strategies.   Patient states she is working through the pain, dealing with it the best that she can, is ok with current pain management regimen, and does not want schedule a sooner pain management MD appointment at this time.  States she will continue monitor the pain and contact MD if the pain get worse.   Patient had questions regarding the meaning of degeneration of spine and pelvis.  RNCM educated patient on the general meaning of degeneration and advised patient to follow up with MD regarding her specific diagnosis.  Patient voiced understanding and is in agreement.   Patient states she has her MRI today @ 1:15pm and will update RNCM on outcome at next  telephone outreach.    Patient states she has received [facility] Welcome packet and calendar.  States she feels the calendar is going to be very helpful and help her to get more organized.   States she will also verify to see if she received the [facility] Consent form, complete  Patient states she has been having more leg cramps and recently started back on magnesium.   Patient is taking approximately 12 medications and is in agreement to [place] referral for medication review and education.    States she is currently using [facility] order pharmacy for maintenance medications.   States the lidocaine gel is very expensive and she purchased the smallest quantity.   States she may need financial assistance with the medication in the future if she is on it longterm.   Patient will advise RNCM if community resources or financial assistance needed for this medication in the future.    Patient in agreement to continue to receive [place] Management services and EMMI educational materials.  Objective:   Current Medications:  Current Outpatient Prescriptions  Medication Sig Dispense Refill  . ALPRAZolam (XANAX) 0.25 MG tablet Take 0.25 mg by mouth.    [name] amLODipine (NORVASC) 10 MG tablet 10 mg.    . aspirin EC 325 MG tablet Take 325 mg by mouth.    . docusate sodium (STOOL SOFTENER) 100 MG capsule Take 100 mg by mouth.    [name] FLUoxetine (PROZAC) 40 MG capsule Take 40 mg  by mouth.    . furosemide (LASIX) 20 MG tablet     . gabapentin (NEURONTIN) 300 MG capsule 2 tablets at night and 1 tablet in the morning and at midday    . ibuprofen (ADVIL,MOTRIN) 800 MG tablet Take 800 mg by mouth.    . Lidocaine HCl 3 % GEL Apply a small amount to affected areas up to three times per day    . Magnesium 250 MG TABS Take by mouth.    . Potassium 99 MG TABS Take by mouth.    . traMADol (ULTRAM) 50 MG tablet Take 50 mg by mouth.    . magnesium oxide (MAG-OX) 400 MG tablet Take 400 mg by mouth. Reported on 06/07/2015     No  current facility-administered medications for this visit.    Functional Status:  In your present state of health, do you have any difficulty performing the following activities: 06/01/2015 05/31/2015  Hearing? - N  Vision? Y Y  Difficulty concentrating or making decisions? - Y  Walking or climbing stairs? - Y  Dressing or bathing? - -  Doing errands, shopping? - Facilities manager and eating ? - N  Using the Toilet? - N  In the past six months, have you accidently leaked urine? Y Y  Do you have problems with loss of bowel control? - N  Managing your Medications? - -  Managing your Finances? - -  Housekeeping or managing your Housekeeping? - -    Fall/Depression Screening: PHQ 2/9 Scores 05/31/2015  PHQ - 2 Score 1   Fall Risk  05/31/2015  Falls in the past year? Yes  Number falls in past yr: 1  Injury with Fall? No  Risk for fall due to : Impaired mobility  Follow up Education provided   [facility] CM Care Plan Problem One        Most Recent Value   Care Plan Problem One  Patient is experiencing chronic back and stomach pain.    Role Documenting the Problem One  Care Management Telephonic [place] for Problem One  Active   THN Long Term Goal (31-90 days)  Patient will not have any inpatient admissions for pain management within  the next 31 days.    THN Long Term Goal Start Date  05/31/15   Interventions for Problem One Long Term Goal  RNCM educated patient on documenting pain triggers.     THN CM Short Term Goal #1 (0-30 days)  Patient will attend pain management follow up MD appointment within 30 days.    THN CM Short Term Goal #1 Start Date  05/31/15   Interventions for Short Term Goal #1  RNCM educated patient to schedule sooner follow up appointment with pain management MD if pain increasing and / or does not decrease within  2 weeks.  [Patient's next MD appt. is 06/21/15.]   THN CM Short Term Goal #2 (0-30 days)  Patient will schedule follow up appointment with  oncologist  within the next 30 days.    THN CM Short Term Goal #2 Start Date  05/31/15   Interventions for Short Term Goal #2  RNCM encouraged patient to follow up with oncologist regarding increased back and stomach pain.      THN CM Care Plan Problem Two        Most Recent Value   Care Plan Problem Two  Patient is feeling disorganized, overwhelmed and missed recent medical follow up appointment.  Role Documenting the Problem Two  Care Management Telephonic Coordinator   Care Plan for Problem Two  Active   THN CM Short Term Goal #1 (0-30 days)  Patient will verbalize that is she is feeling less overwhelmed and more organized with following up on MD appointments within the next 30 days.   THN CM Short Term Goal #1 Start Date  05/31/15   [facility] CM Short Term Goal #1 Met Date   06/07/15 [Patient states she received the calendar and it works.]   Interventions for Short Term Goal #2   RNCM educated patient on [place] Management resources and will send [facility] patient calendar.       Assessment: Referral received on 05/12/15. Referral source: [name] at [facility]. States patient has chronic pain and depression. Patient with recent grief and loss due to husband's death in 03-11-15. States the patient reports that she is confused about her options for care, risks and benefits. Patient wants to manage pain and participate in PT. Patient has barriers and does not know what to do. Services requested: [place]. Patient in agreement to continue to receive [place] Management services. Patient will be continued to be followed by telephonic [facility] RNCM for pain management care coordination, education and community resources.   Plan: RNCM will refer patient to [place] for medication review and education. RNCM will send patient the following EMMI educational materials: Nutrition and Healthy Eating video, Chronic Low Back pain video, Chronic Low Back pain (The  Basics) handout within 1 week.     RNCM will call patient within 1 week for telephone outreach reach, follow up on 06/07/15 MRI results and written consent form.  RNCM will follow up with patient regarding pain management within 1 week. RNCM will follow up with patient within 3 weeks to verify receipt of EMMI educational materials and see if any additional questions.    Vrinda Heckstall H. Annia Friendly, BSN, [place] Management [facility] Telephonic CM Phone: [phone] Fax: [phone]

## 2015-06-13 ENCOUNTER — Other Ambulatory Visit: Payer: Self-pay | Admitting: *Deleted

## 2015-06-13 NOTE — Patient Outreach (Signed)
[facility]) Care Management  06/13/2015  Diana Bernard [birth year] [id number]  Subjective:  Telephone call to patient's home number, no answer, left HIPAA compliant voicemail message, and requested call back.  Objective: Per Epic case review: Patient has not had any inpatient admits or ED visits in 2016.  Assessment: Referral received on 05/12/15. Referral source: [name] at [facility]. States patient has chronic pain and depression. Patient with recent grief and loss due to husband's death in 2015-03-16. States the patient reports that she is confused about her options for care, risks and benefits. Patient wants to manage pain and participate in PT. Patient has barriers and does not know what to do. Services requested: [place]. Patient in agreement to continue to receive [place] Management services. Patient will be continued to be followed by telephonic [facility] RNCM for pain management care coordination, education and community resources.   Plan: RNCM will call patient within 1 week for telephone outreach reach, follow up on 06/07/15 MRI results, medication review referral and written consent form, if no return call from patient. RNCM will follow up with patient regarding pain management within 1 week. RNCM will follow up with patient within 3 weeks to verify receipt of EMMI educational materials [ (Nutrition and Healthy Eating video, Chronic Low Back pain video, Chronic Low Back pain (The Basics) ]  and see if any additional questions.    Kiev [place]rosse H. Annia Friendly, BSN, [place] Management [facility] Telephonic CM Phone: [phone] Fax: [phone]

## 2015-06-15 DIAGNOSIS — M159 Polyosteoarthritis, unspecified: Secondary | ICD-10-CM | POA: Diagnosis not present

## 2015-06-15 DIAGNOSIS — T148 Other injury of unspecified body region: Secondary | ICD-10-CM | POA: Diagnosis not present

## 2015-06-15 DIAGNOSIS — M47817 Spondylosis without myelopathy or radiculopathy, lumbosacral region: Secondary | ICD-10-CM | POA: Diagnosis not present

## 2015-06-15 DIAGNOSIS — M5126 Other intervertebral disc displacement, lumbar region: Secondary | ICD-10-CM | POA: Diagnosis not present

## 2015-06-15 DIAGNOSIS — M4856XS Collapsed vertebra, not elsewhere classified, lumbar region, sequela of fracture: Secondary | ICD-10-CM | POA: Diagnosis not present

## 2015-06-15 DIAGNOSIS — M439 Deforming dorsopathy, unspecified: Secondary | ICD-10-CM | POA: Diagnosis not present

## 2015-06-15 DIAGNOSIS — M5136 Other intervertebral disc degeneration, lumbar region: Secondary | ICD-10-CM | POA: Diagnosis not present

## 2015-06-15 DIAGNOSIS — R29890 Loss of height: Secondary | ICD-10-CM | POA: Diagnosis not present

## 2015-06-15 DIAGNOSIS — M4856XA Collapsed vertebra, not elsewhere classified, lumbar region, initial encounter for fracture: Secondary | ICD-10-CM | POA: Diagnosis not present

## 2015-06-16 ENCOUNTER — Other Ambulatory Visit: Payer: Self-pay | Admitting: *Deleted

## 2015-06-16 NOTE — Patient Outreach (Signed)
[facility]) Care Management  [facility] Care Manager  06/16/2015   Diana Bernard 05/10/1945 [id number]  Subjective: Telephone call to patient's home number, no answer, left HIPAA compliant voicemail message requesting call back.   Telephone call from patient and HIPAA verified.   Patient states she is doing well and now has some hope.   States her back pain is at a 5 rating and just recently took some pain medication.   States she saw surgeon Dr. Frederik Pear at [facility] on 06/15/15 and the appointment went well.  States per MD the MRI results showed that she has a fracture of L2 and the fracture is related to the fall of the riding lawn mower that she had in early 2016/12/04prior to husband's death.  States she is scheduled to have surgery on her back on 06/23/15 to repair the fracture at [facility].   RNCM discussed strategies for dealing with estate matters after a loss of a loved one by having designated dates and times to deal with the matters and having designated respite times.   Patient voiced understanding and is in agreement to try strategies to decrease stress and increase her organization.   Patient states she has also purchased a tote to keep all of her important papers in one location.   Patient in agreement to continue to receive [facility] Care Management services.    Objective: Per Epic case review: Patient has not had any inpatient admits or ED visits in 2016.   Current Medications:  Current Outpatient Prescriptions  Medication Sig Dispense Refill  . amLODipine (NORVASC) 10 MG tablet 10 mg.    . aspirin EC 325 MG tablet Take 325 mg by mouth.    . cholecalciferol (VITAMIN D) 1000 units tablet Take 1,000 Units by mouth daily.    [name] docusate sodium (STOOL SOFTENER) 100 MG capsule Take 100 mg by mouth.    [name] FLUoxetine (PROZAC) 40 MG capsule Take 40 mg by mouth.    . furosemide (LASIX) 20 MG tablet     . gabapentin (NEURONTIN) 300 MG  capsule 2 tablets at night and 1 tablet in the morning and at midday    . hydrochlorothiazide (HYDRODIURIL) 25 MG tablet Take 25 mg by mouth daily.    [name] ibuprofen (ADVIL,MOTRIN) 800 MG tablet Take 800 mg by mouth.    . Lidocaine HCl 3 % GEL Apply a small amount to affected areas up to three times per day    . Magnesium 250 MG TABS Take by mouth.    . Potassium 99 MG TABS Take by mouth.    . traMADol (ULTRAM) 50 MG tablet Take 50 mg by mouth.    . ALPRAZolam (XANAX) 0.25 MG tablet Take 0.25 mg by mouth. Reported on 06/16/2015    . magnesium oxide (MAG-OX) 400 MG tablet Take 400 mg by mouth. Reported on 06/16/2015     No current facility-administered medications for this visit.    Functional Status:  In your present state of health, do you have any difficulty performing the following activities: 06/01/2015 05/31/2015  Hearing? - N  Vision? Y Y  Difficulty concentrating or making decisions? - Y  Walking or climbing stairs? - Y  Dressing or bathing? - -  Doing errands, shopping? - Chief Operating Officer and eating ? - N  Using the Toilet? - N  In the past six months, have you accidently leaked urine? Malvin Johns  Do you have  problems with loss of bowel control? - N  Managing your Medications? - -  Managing your Finances? - -  Housekeeping or managing your Housekeeping? - -    Fall/Depression Screening: PHQ 2/9 Scores 05/31/2015  PHQ - 2 Score 1   Fall Risk  05/31/2015  Falls in the past year? Yes  Number falls in past yr: 1  Injury with Fall? No  Risk for fall due to : Impaired mobility  Follow up Education provided   [facility] CM Care Plan Problem One        Most Recent Value   Care Plan Problem One  Patient is experiencing chronic back and stomach pain.    Role Documenting the Problem One  Care Management Telephonic [place] for Problem One  Active   THN Long Term Goal (31-90 days)  Patient will not have any inpatient admissions for pain management within  the next 31 days.    THN  Long Term Goal Start Date  05/31/15   Interventions for Problem One Long Term Goal  RNCM educated patient on knowing possible surgery complications.   [Patient scheduled to have back surgery on 06/23/15.]   THN CM Short Term Goal #1 (0-30 days)  Patient will attend pain management follow up MD appointment within 30 days.    THN CM Short Term Goal #1 Start Date  05/31/15   Interventions for Short Term Goal #1  RNCM educated patient to schedule sooner follow up appointment with pain management MD if pain increasing and / or does not decrease within  2 weeks.  [Patient's next MD appt. is 06/21/15.]   THN CM Short Term Goal #2 (0-30 days)  Patient will schedule follow up appointment with oncologist  within the next 30 days.    THN CM Short Term Goal #2 Start Date  05/31/15   Interventions for Short Term Goal #2  RNCM encouraged patient to follow up with oncologist regarding increased back and stomach pain.      THN CM Care Plan Problem Two        Most Recent Value   Care Plan Problem Two  Patient is feeling disorganized, overwhelmed and missed recent medical follow up appointment.    Role Documenting the Problem Two  Care Management Telephonic Coordinator   Care Plan for Problem Two  Active   THN CM Short Term Goal #1 (0-30 days)  Patient will verbalize that is she is feeling less overwhelmed and more organized with following up on MD appointments within the next 30 days.   THN CM Short Term Goal #1 Start Date  05/31/15   [facility] CM Short Term Goal #1 Met Date   06/07/15 [Patient states she received the calendar and it works.]   Interventions for Short Term Goal #2   RNCM educated patient on [place] Management resources and will send [facility] patient calendar.       Assessment: Referral received on 05/12/15. Referral source: [name] at [facility]. States patient has chronic pain and depression. Patient with recent grief and loss due to husband's death in 2015-03-10. States the  patient reports that she is confused about her options for care, risks and benefits. Patient wants to manage pain and participate in PT. Patient has barriers and does not know what to do. Services requested: [place]. Patient in agreement to continue to receive [place] Management services. Patient continues to be followed by telephonic [facility] RNCM for pain management  care coordination, education and community resources.   Plan: RNCM will call patient with in 2 weeks to follow up on surgery, educational materials, [facility] Consent, Advanced Directive forms, pain management, and physical therapy.    Zyere Jiminez H. Annia Friendly, BSN, [place] Management [facility] Telephonic CM Phone: [phone] Fax: [phone]

## 2015-06-20 DIAGNOSIS — Z01818 Encounter for other preprocedural examination: Secondary | ICD-10-CM | POA: Diagnosis not present

## 2015-06-20 DIAGNOSIS — M545 Low back pain: Secondary | ICD-10-CM | POA: Diagnosis not present

## 2015-06-20 DIAGNOSIS — F411 Generalized anxiety disorder: Secondary | ICD-10-CM | POA: Diagnosis not present

## 2015-06-20 DIAGNOSIS — M791 Myalgia: Secondary | ICD-10-CM | POA: Diagnosis not present

## 2015-06-20 DIAGNOSIS — T148 Other injury of unspecified body region: Secondary | ICD-10-CM | POA: Diagnosis not present

## 2015-06-20 DIAGNOSIS — M40205 Unspecified kyphosis, thoracolumbar region: Secondary | ICD-10-CM | POA: Diagnosis not present

## 2015-06-20 DIAGNOSIS — M549 Dorsalgia, unspecified: Secondary | ICD-10-CM | POA: Diagnosis not present

## 2015-06-20 DIAGNOSIS — I10 Essential (primary) hypertension: Secondary | ICD-10-CM | POA: Diagnosis not present

## 2015-06-20 DIAGNOSIS — F329 Major depressive disorder, single episode, unspecified: Secondary | ICD-10-CM | POA: Diagnosis not present

## 2015-06-21 ENCOUNTER — Other Ambulatory Visit: Payer: Self-pay | Admitting: Pharmacist

## 2015-06-21 NOTE — Patient Outreach (Signed)
[facility]) Care Management  06/21/2015  Sheral Spoelstra [birth year] [id number]   [name] is a 70yo who was referred to [place] for medication review/education and medication assistance.  I made outreach call to patient to review her medications and assess for medication assistance needs.  Patient reports she was not currently at home right now and requested a call back at a later time.  Scheduled phone call for 06/22/15 at 9:00 AM.     Elisabeth Most, Pharm.D. Pharmacy Resident [place] [phone]

## 2015-06-22 ENCOUNTER — Other Ambulatory Visit: Payer: Self-pay | Admitting: Pharmacist

## 2015-06-22 NOTE — Patient Outreach (Signed)
Diana Bernard [facility]) Care Management  [place]   06/22/2015  Diana Bernard 1945-08-20 [id number]  Subjective: Diana Bernard is a 70yo who was referred to [place] for medication review/education and medication assistance.  I made outreach call to patient to review her medications and assess for medication assistance needs.    I reviewed all of patient's medications with her and discussed purpose and proper use.  Patient reports she has a good understanding of what her medications are for and reports compliance with her medications.    Patient reports she has [employer] and currently uses [facility] order to fill all prescriptions.  She reports Bernard of her medication are free through [facility] mail order.  Patient reports she will use [facility] to [facility] prescriptions such as antibiotics.  Patient reports the only medication she has difficulty affording is her lidocaine 3% gel and she reports the copay was several hundred dollars.  Patient reports she had a partial prescription filled as she could not afford the full prescription.    Objective:   Current Medications: Current Outpatient Prescriptions  Medication Sig Dispense Refill  . amLODipine (NORVASC) 10 MG tablet 10 mg.    . aspirin EC 325 MG tablet Take 325 mg by mouth.    . cholecalciferol (VITAMIN D) 1000 units tablet Take 1,000 Units by mouth daily.    [name] docusate sodium (STOOL SOFTENER) 100 MG capsule Take 200 mg by mouth daily.     [name] esomeprazole (NEXIUM) 20 MG capsule Take 20 mg by mouth daily at 12 noon.    [name] FLUoxetine (PROZAC) 40 MG capsule Take 40 mg by mouth.    . furosemide (LASIX) 20 MG tablet     . gabapentin (NEURONTIN) 300 MG capsule 2 tablets at night and 1 tablet in the morning and at midday    . hydrochlorothiazide (HYDRODIURIL) 25 MG tablet Take 25 mg by mouth daily.    . Magnesium 250 MG TABS Take by mouth.    . Potassium 99 MG TABS Take by mouth.    [name] ibuprofen  (ADVIL,MOTRIN) 800 MG tablet Take 800 mg by mouth. Reported on 06/22/2015    . Lidocaine HCl 3 % GEL Reported on 06/22/2015    . traMADol (ULTRAM) 50 MG tablet Take 50 mg by mouth. Reported on 06/22/2015     No current facility-administered medications for this visit.   Functional Status: In your present state of health, do you have any difficulty performing the following activities: 06/01/2015 05/31/2015  Hearing? - N  Vision? Y Y  Difficulty concentrating or making decisions? - Y  Walking or climbing stairs? - Y  Dressing or bathing? - -  Doing errands, shopping? - Facilities manager and eating ? - N  Using the Toilet? - N  In the past six months, have you accidently leaked urine? Y Y  Do you have problems with loss of bowel control? - N  Managing your Medications? - -  Managing your Finances? - -  Housekeeping or managing your Housekeeping? - -   Fall/Depression Screening: PHQ 2/9 Scores 05/31/2015  PHQ - 2 Score 1    Assessment: 1.  Medication review:    Drugs sorted by system:  Neurologic/Psychologic: fluoxetine  Cardiovascular: amlodipine, aspirin, furosemide, hydrochlorothiazide   Pulmonary/Allergy: none  Gastrointestinal: docusate, esomeprazole  Endocrine: none  Renal: none  Topical: lidocaine 3% gel   Pain: gabapentin, ibuprofen, tramadol  Vitamins/Minerals: cholecalciferol, magnesium, potassium  Infectious Diseases: none  Miscellaneous: none   Duplications in therapy: none noted  Gaps in therapy: none noted Medications to avoid in the elderly: esomeprazole (risk of Clostridium difficile infection and bone loss and fractures) Drug interactions: aspirin and ibuprofen - regular use of ibuprofen may decrease the antiplatelet effects of aspirin resulting in reduced antiplatelet efficacy - patient reports only using ibuprofen as needed for back pain and she hopes she will not have to use ibuprofen after back surgery on 06/23/15, I counseled patient to administer  ibuprofen 30 minutes after aspirin Other issues noted: none noted  2.  Medication assistance:  Reviewed [facility] formulary and identified that lidocaine 3% gel is not on the formulary, but lidocaine 2% gel is covered by patient's insurance.  I discussed this with patient and informed her that the lidocaine 2% gel would be $17 copay at [facility] or zero dollar copay through [facility] mail order.  Patient voices understanding and reports she will ask her provider about changing her prescription to lidocaine 2% gel.     Plan: 1.  Medication review:  Patient is currently on esomeprazole.  Per beers list, it is recommended to avoid scheduled use of proton pump inhibitors for greater than 8 weeks.  Please consider trial of a H2 antagonist in place of proton pump inhibitor.  Will send this information to patient's PCP.  No other issues noted.    2.  Medication assistance:  Patient reports she will ask her provider about changing her lidocaine prescription to the 2% gel which is covered by her insurance.    3.  Will close pharmacy program as patient denies any further pharmacy needs.  Provided patient with my contact information and encouraged her to contact me if she has any further questions or concerns regarding her medications.  Will update [facility] CMRN Sonda Rumble.    Diana Bernard, Pharm.D. Pharmacy Resident [place] [phone]

## 2015-06-22 NOTE — Patient Outreach (Signed)
[facility] [facility]) Care Management  06/22/2015  [name] 11-19-1945 [id number]   Diana Bernard is a 70yo who was referred to [place] for medication review/education and medication assistance.  I made outreach call to patient at 9:00 AM as scheduled to review her medications and assess for medication assistance needs.  Patient answered the phone but reports she is not able to talk at this time.  Patient reports she forgot about the scheduled call and has to take one of her animals to the veterinarian's office this morning.  Patient requested a call back this afternoon.  Will plan an outreach call to patient today at 3:30 PM.  Patient agreed to scheduled time.     Elisabeth Most, Pharm.D. Pharmacy Resident [place] [phone]

## 2015-06-23 ENCOUNTER — Encounter: Payer: Self-pay | Admitting: Pharmacist

## 2015-06-23 DIAGNOSIS — I10 Essential (primary) hypertension: Secondary | ICD-10-CM | POA: Diagnosis not present

## 2015-06-23 DIAGNOSIS — M1990 Unspecified osteoarthritis, unspecified site: Secondary | ICD-10-CM | POA: Diagnosis not present

## 2015-06-23 DIAGNOSIS — D7581 Myelofibrosis: Secondary | ICD-10-CM | POA: Diagnosis not present

## 2015-06-23 DIAGNOSIS — M79605 Pain in left leg: Secondary | ICD-10-CM | POA: Diagnosis not present

## 2015-06-23 DIAGNOSIS — S32020A Wedge compression fracture of second lumbar vertebra, initial encounter for closed fracture: Secondary | ICD-10-CM | POA: Diagnosis not present

## 2015-06-23 DIAGNOSIS — Z7982 Long term (current) use of aspirin: Secondary | ICD-10-CM | POA: Diagnosis not present

## 2015-06-23 DIAGNOSIS — S32029A Unspecified fracture of second lumbar vertebra, initial encounter for closed fracture: Secondary | ICD-10-CM | POA: Diagnosis not present

## 2015-06-23 DIAGNOSIS — M79604 Pain in right leg: Secondary | ICD-10-CM | POA: Diagnosis not present

## 2015-06-23 DIAGNOSIS — T148 Other injury of unspecified body region: Secondary | ICD-10-CM | POA: Diagnosis not present

## 2015-06-29 ENCOUNTER — Other Ambulatory Visit: Payer: Self-pay | Admitting: *Deleted

## 2015-06-29 NOTE — Patient Outreach (Signed)
[facility]) Care Management  [place]  06/29/2015   Diana Bernard 1945/05/30 [id number]  Subjective: Telephone call to patient's home number, spoke with patient, and HIPAA verified.   Patient states she will call RNCM back in a few minutes on land line.   Telephone call from patient and HIPAA verified.  Patient states she is doing well and feeling much better about things.   States her back surgery went well,  her pain rating is 3, and goal remains 2 rating.   States she is so glad that she went ahead with the surgery and it has made a big difference in her pain management.  States she has follow up appointment on 07/08/15  at 10:15am with the back surgeon Dr. Idamae Schuller in [place].  States he got a prescription for Oxycodone filled after surgery because she was not sure how her pain would be, but has not taken it and is able to manage with pre surgery pain medications.   States she is going to talk with pain specialist regarding changing her lidocaine gel to 2% strength on her next follow up visit.    Patient states she has no activity restrictions and managing her activities as tolerated.   States surgeon has prescribed outpatient pelvic rehab / therapy at [facility] [place] in [place] once a week times 5 weeks starting on 06/30/15.   RNCM educated patient on the rationale for pelvic exercise with back surgery and back injuries.    Patient voiced understanding and will keep RNCM updated on her progress with therapy.   Patient states her legs feel like jelly at times and voices using safety precautions as needed.  Patient states she has a follow up appointment with the oncologist Dr. Benard Halsted on 07/04/15 at 10:00am in [place].  States she is proactively monitoring her diet to prevent diverticulitis flare up and future stomach issues due to her past medical history.   Patient states she now has a power of attorney and living will in place.   States she will send RNCM a copy to put on file once she receives the final copy from her attorney.   Patient states she is feeling much better about things and knows she will never be the same.  States she plan to continue to make the best out of life.  States she recognizes some of her life challenges (having cancer, death of her husband) and realizes that she can make it through almost anything.  States she realizes that things could always be worse and she is very blessed.  RNCM praised patient for her positive attitude and progress that patient has made during [place] Management program.   Patient states she is very appreciative of RNCM's assistance and the [facility] program.   Patient states she received the educational material from [facility] and does not have any questions. Patient in agreement to continue to receive [place] Management services.    Objective: Per Epic case review: Patient has not had any inpatient admits or ED visits in 2016.    Current Medications:  Current Outpatient Prescriptions  Medication Sig Dispense Refill  . amLODipine (NORVASC) 10 MG tablet 10 mg.    . aspirin EC 325 MG tablet Take 325 mg by mouth.    . cholecalciferol (VITAMIN D) 1000 units tablet Take 1,000 Units by mouth daily.    [name] docusate sodium (STOOL SOFTENER) 100 MG capsule Take 200 mg by  mouth daily.     [name] esomeprazole (NEXIUM) 20 MG capsule Take 20 mg by mouth daily at 12 noon.    [name] FLUoxetine (PROZAC) 40 MG capsule Take 40 mg by mouth.    . furosemide (LASIX) 20 MG tablet     . gabapentin (NEURONTIN) 300 MG capsule 2 tablets at night and 1 tablet in the morning and at midday    . hydrochlorothiazide (HYDRODIURIL) 25 MG tablet Take 25 mg by mouth daily.    [name] ibuprofen (ADVIL,MOTRIN) 800 MG tablet Take 800 mg by mouth. Reported on 06/22/2015    . Lidocaine HCl 3 % GEL Reported on 06/22/2015    . Magnesium 250 MG TABS Take by mouth.    . Potassium 99 MG TABS Take by mouth.    . traMADol (ULTRAM) 50 MG tablet  Take 50 mg by mouth. Reported on 06/22/2015     No current facility-administered medications for this visit.    Functional Status:  In your present state of health, do you have any difficulty performing the following activities: 06/01/2015 05/31/2015  Hearing? - N  Vision? Y Y  Difficulty concentrating or making decisions? - Y  Walking or climbing stairs? - Y  Dressing or bathing? - -  Doing errands, shopping? - Facilities manager and eating ? - N  Using the Toilet? - N  In the past six months, have you accidently leaked urine? Y Y  Do you have problems with loss of bowel control? - N  Managing your Medications? - -  Managing your Finances? - -  Housekeeping or managing your Housekeeping? - -    Fall/Depression Screening: PHQ 2/9 Scores 05/31/2015  PHQ - 2 Score 1   Fall Risk  05/31/2015  Falls in the past year? Yes  Number falls in past yr: 1  Injury with Fall? No  Risk for fall due to : Impaired mobility  Follow up Education provided   [facility] CM Care Plan Problem One        Most Recent Value   Care Plan Problem One  Patient is experiencing chronic back and stomach pain.    Role Documenting the Problem One  Care Management Telephonic [place] for Problem One  Active   THN Long Term Goal (31-90 days)  Patient will not have any inpatient admissions for pain management within  the next 31 days.    THN Long Term Goal Start Date  05/31/15   Interventions for Problem One Long Term Goal  RNCM educated patient pelvic therapy.   [Patient had back surgery on 06/23/15 and doing well.]   THN CM Short Term Goal #1 (0-30 days)  Patient will attend pain management follow up MD appointment within 30 days.    THN CM Short Term Goal #1 Start Date  05/31/15   [facility] CM Short Term Goal #1 Met Date  06/29/15 [Patient attending MD appointments as scheduled.]   Interventions for Short Term Goal #1  RNCM discussed patient's pain goal and patient's goal remain for 2 rating, will discuss with MD  as needed.   THN CM Short Term Goal #2 (0-30 days)  Patient will schedule follow up appointment with oncologist  within the next 30 days.    THN CM Short Term Goal #2 Start Date  05/31/15   [facility] CM Short Term Goal #2 Met Date  06/29/15 [Patient has appointment with oncologist on 07/04/15. ]   Interventions for Short Term Goal #2  RNCM  encouraged patient to follow up with oncologist regarding increased back and stomach pain.      THN CM Care Plan Problem Two        Most Recent Value   Care Plan Problem Two  Patient is feeling disorganized, overwhelmed and missed recent medical follow up appointment.    Role Documenting the Problem Two  Care Management Telephonic Coordinator   Care Plan for Problem Two  Not Active   THN CM Short Term Goal #1 (0-30 days)  Patient will verbalize that is she is feeling less overwhelmed and more organized with following up on MD appointments within the next 30 days.   THN CM Short Term Goal #1 Start Date  05/31/15   [facility] CM Short Term Goal #1 Met Date   06/07/15 [Patient states she is on point with all of her appointments.]   Interventions for Short Term Goal #2   RNCM educated patient on [place] Management resources and will send [facility] patient calendar.        Assessment: Referral received on 05/12/15. Referral source: [name] at [facility]. States patient has chronic pain and depression. Patient with recent grief and loss due to husband's death in 03-17-2015. States the patient reports that she is confused about her options for care, risks and benefits. Patient wants to manage pain and participate in PT. Patient has barriers and does not know what to do. Services requested: [place]. Patient in agreement to continue to receive [place] Management services. Patient continues to be followed by telephonic [facility] RNCM for pain management care coordination, education and community resources.   Plan: RNCM will call patient with  in 2 weeks to follow up on outpatient pelvic exercise/ physical therapy, outcome of MD appointments, [facility] Consent, copy of Advanced Directives, and pain management.  RNCM will proceed with case closure within 2 weeks if patient has no further care management needs.   Juliza Machnik H. Annia Friendly, BSN, [place] Management [facility] Telephonic CM Phone: [phone] Fax: [phone]

## 2015-06-30 DIAGNOSIS — R531 Weakness: Secondary | ICD-10-CM | POA: Diagnosis not present

## 2015-06-30 DIAGNOSIS — N3941 Urge incontinence: Secondary | ICD-10-CM | POA: Diagnosis not present

## 2015-06-30 DIAGNOSIS — G8929 Other chronic pain: Secondary | ICD-10-CM | POA: Diagnosis not present

## 2015-06-30 DIAGNOSIS — Z923 Personal history of irradiation: Secondary | ICD-10-CM | POA: Diagnosis not present

## 2015-06-30 DIAGNOSIS — M461 Sacroiliitis, not elsewhere classified: Secondary | ICD-10-CM | POA: Diagnosis not present

## 2015-06-30 DIAGNOSIS — N393 Stress incontinence (female) (male): Secondary | ICD-10-CM | POA: Diagnosis not present

## 2015-06-30 DIAGNOSIS — M545 Low back pain: Secondary | ICD-10-CM | POA: Diagnosis not present

## 2015-06-30 DIAGNOSIS — Z8542 Personal history of malignant neoplasm of other parts of uterus: Secondary | ICD-10-CM | POA: Diagnosis not present

## 2015-06-30 DIAGNOSIS — Z9221 Personal history of antineoplastic chemotherapy: Secondary | ICD-10-CM | POA: Diagnosis not present

## 2015-07-04 DIAGNOSIS — R509 Fever, unspecified: Secondary | ICD-10-CM | POA: Diagnosis not present

## 2015-07-04 DIAGNOSIS — R1032 Left lower quadrant pain: Secondary | ICD-10-CM | POA: Diagnosis not present

## 2015-07-04 DIAGNOSIS — C541 Malignant neoplasm of endometrium: Secondary | ICD-10-CM | POA: Diagnosis not present

## 2015-07-04 DIAGNOSIS — Z452 Encounter for adjustment and management of vascular access device: Secondary | ICD-10-CM | POA: Diagnosis not present

## 2015-07-04 DIAGNOSIS — Z8542 Personal history of malignant neoplasm of other parts of uterus: Secondary | ICD-10-CM | POA: Diagnosis not present

## 2015-07-06 DIAGNOSIS — N3941 Urge incontinence: Secondary | ICD-10-CM | POA: Diagnosis not present

## 2015-07-06 DIAGNOSIS — Z923 Personal history of irradiation: Secondary | ICD-10-CM | POA: Diagnosis not present

## 2015-07-06 DIAGNOSIS — Z9221 Personal history of antineoplastic chemotherapy: Secondary | ICD-10-CM | POA: Diagnosis not present

## 2015-07-06 DIAGNOSIS — N393 Stress incontinence (female) (male): Secondary | ICD-10-CM | POA: Diagnosis not present

## 2015-07-06 DIAGNOSIS — R531 Weakness: Secondary | ICD-10-CM | POA: Diagnosis not present

## 2015-07-06 DIAGNOSIS — Z8542 Personal history of malignant neoplasm of other parts of uterus: Secondary | ICD-10-CM | POA: Diagnosis not present

## 2015-07-06 DIAGNOSIS — M461 Sacroiliitis, not elsewhere classified: Secondary | ICD-10-CM | POA: Diagnosis not present

## 2015-07-06 DIAGNOSIS — M545 Low back pain: Secondary | ICD-10-CM | POA: Diagnosis not present

## 2015-07-06 DIAGNOSIS — G8929 Other chronic pain: Secondary | ICD-10-CM | POA: Diagnosis not present

## 2015-07-08 DIAGNOSIS — Z7982 Long term (current) use of aspirin: Secondary | ICD-10-CM | POA: Diagnosis not present

## 2015-07-08 DIAGNOSIS — G5792 Unspecified mononeuropathy of left lower limb: Secondary | ICD-10-CM | POA: Diagnosis not present

## 2015-07-08 DIAGNOSIS — M5135 Other intervertebral disc degeneration, thoracolumbar region: Secondary | ICD-10-CM | POA: Diagnosis not present

## 2015-07-08 DIAGNOSIS — M40209 Unspecified kyphosis, site unspecified: Secondary | ICD-10-CM | POA: Diagnosis not present

## 2015-07-08 DIAGNOSIS — M47815 Spondylosis without myelopathy or radiculopathy, thoracolumbar region: Secondary | ICD-10-CM | POA: Diagnosis not present

## 2015-07-08 DIAGNOSIS — Z4889 Encounter for other specified surgical aftercare: Secondary | ICD-10-CM | POA: Diagnosis not present

## 2015-07-08 DIAGNOSIS — I10 Essential (primary) hypertension: Secondary | ICD-10-CM | POA: Diagnosis not present

## 2015-07-08 DIAGNOSIS — Z6841 Body Mass Index (BMI) 40.0 and over, adult: Secondary | ICD-10-CM | POA: Diagnosis not present

## 2015-07-08 DIAGNOSIS — E669 Obesity, unspecified: Secondary | ICD-10-CM | POA: Diagnosis not present

## 2015-07-08 DIAGNOSIS — M5136 Other intervertebral disc degeneration, lumbar region: Secondary | ICD-10-CM | POA: Diagnosis not present

## 2015-07-13 ENCOUNTER — Encounter: Payer: Self-pay | Admitting: *Deleted

## 2015-07-13 ENCOUNTER — Other Ambulatory Visit: Payer: Self-pay | Admitting: *Deleted

## 2015-07-13 NOTE — Patient Outreach (Addendum)
[facility]) Care Management  [place]  07/13/2015   Diana Bernard 1946-04-10 [id number]  Subjective:  Telephone call to patient's home number times 2, spoke with patient, and HIPAA verified.   States she is doing well and has accepted that she will not be pain free.   States her pain goal remains the same.   RNCM encouraged patient to continue being informed regarding her healthcare and seeking understanding from her healthcare providers on her health status.    Patient voices understanding and is in agreement.    States she is planning to talk with physical therapist and surgeon regarding nerve damage status.  States she is wondering if the nerve damage on left side of her body, is causing her recovery status to be slower than she would like.    States she has noticed an improvement in her endurance with activities of daily living.  States she is currently tolerating 20 minutes of activity prior to resting versus 10 minutes.  Patient states her goal is to walk normally without a limp but is willing to accept whatever the MD says is a realistic goal.   States her back, hip, and leg is still very weak.   RNCM discussed patient advocacy and how to discuss her concerns with providers.   Patient voiced understanding and is in agreement.   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  States she is not ready to return to work and will return in the future.  Patient states she has had 2 sessions with an outpatient physical therapist and the pelvic therapy is going well.  States she had therapy scheduled today that was cancelled due to schedule conflict and will resume therapy next week.  RNCM answered patient's questions regarding [facility] consent form.  Patient states RNCM answered her consent questions.   Patient states she will mail consent and a copy of advanced directives to [facility] as soon as possible.    States she has not received the final copy of  her Advanced directives from her attorney and is expecting to receive soon.   Patient states she does not have any further [facility] needs at this time and is in agreement with case closure.   Patient states she has enjoyed working with [facility],  that the services have really helped and she is very Patent attorney.   Objective: Per Epic case review: Patient has not had any inpatient admits or ED visits in 2016.  Current Medications:  Current Outpatient Prescriptions  Medication Sig Dispense Refill  . amLODipine (NORVASC) 10 MG tablet 10 mg.    . aspirin EC 325 MG tablet Take 325 mg by mouth.    . cholecalciferol (VITAMIN D) 1000 units tablet Take 1,000 Units by mouth daily.    [name] docusate sodium (STOOL SOFTENER) 100 MG capsule Take 200 mg by mouth daily.     [name] esomeprazole (NEXIUM) 20 MG capsule Take 20 mg by mouth daily at 12 noon.    [name] FLUoxetine (PROZAC) 40 MG capsule Take 40 mg by mouth.    . furosemide (LASIX) 20 MG tablet     . gabapentin (NEURONTIN) 300 MG capsule 2 tablets at night and 1 tablet in the morning and at midday    . hydrochlorothiazide (HYDRODIURIL) 25 MG tablet Take 25 mg by mouth daily.    [name] ibuprofen (ADVIL,MOTRIN) 800 MG tablet Take 800 mg by mouth. Reported on 06/22/2015    .  Lidocaine HCl 3 % GEL Reported on 06/22/2015    . Magnesium 250 MG TABS Take by mouth.    . Potassium 99 MG TABS Take by mouth.    . traMADol (ULTRAM) 50 MG tablet Take 50 mg by mouth. Reported on 06/22/2015     No current facility-administered medications for this visit.    Functional Status:  In your present state of health, do you have any difficulty performing the following activities: 06/01/2015 05/31/2015  Hearing? - N  Vision? Y Y  Difficulty concentrating or making decisions? - Y  Walking or climbing stairs? - Y  Dressing or bathing? - -  Doing errands, shopping? - Facilities manager and eating ? - N  Using the Toilet? - N  In the past six months, have you  accidently leaked urine? Y Y  Do you have problems with loss of bowel control? - N  Managing your Medications? - -  Managing your Finances? - -  Housekeeping or managing your Housekeeping? - -    Fall/Depression Screening: PHQ 2/9 Scores 05/31/2015  PHQ - 2 Score 1   Fall Risk  05/31/2015  Falls in the past year? Yes  Number falls in past yr: 1  Injury with Fall? No  Risk for fall due to : Impaired mobility  Follow up Education provided   [facility] CM Care Plan Problem One        Most Recent Value   Care Plan Problem One  Patient is experiencing chronic back and stomach pain.    Role Documenting the Problem One  Care Management Telephonic Coordinator   Care Plan for Problem One  Not Active   [facility] Long Term Goal (31-90 days)  Patient will not have any inpatient admissions for pain management within  the next 31 days.    THN Long Term Goal Start Date  05/31/15   [facility] Long Term Goal Met Date  07/13/15 [Patient has outpatient surgery on 06/23/15, no admit.]   Interventions for Problem One Long Term Goal  RNCM educated patient pelvic therapy.   [Patient had back surgery on 06/23/15 and doing well.]   [facility]N CM Short Term Goal #1 (0-30 days)  Patient will attend pain management follow up MD appointment within 30 days.    [facility]N CM Short Term Goal #1 Start Date  05/31/15   [facility] CM Short Term Goal #1 Met Date  06/29/15 [Patient saw pain MD on 06/21/15.]   Interventions for Short Term Goal #1  RNCM discussed patient's pain goal and patient's goal remain for 2 rating, will discuss with MD as needed.   [facility] CM Short Term Goal #2 (0-30 days)  Patient will schedule follow up appointment with oncologist  within the next 30 days.    [facility]N CM Short Term Goal #2 Start Date  05/31/15   [facility] CM Short Term Goal #2 Met Date  06/29/15 [Patient has appointment with oncologist on 07/04/15. ]   Interventions for Short Term Goal #2  RNCM encouraged patient to follow up with oncologist regarding increased back and stomach pain.       [facility]N CM Care Plan Problem Two        Most Recent Value   Care Plan Problem Two  Patient is feeling disorganized, overwhelmed and missed recent medical follow up appointment.    Role Documenting the Problem Two  Care Management Telephonic Coordinator   Care Plan for Problem Two  Not Active   [facility] CM Short Term Goal #1 (  0-30 days)  Patient will verbalize that is she is feeling less overwhelmed and more organized with following up on MD appointments within the next 30 days.   [facility]N CM Short Term Goal #1 Start Date  05/31/15   [facility] CM Short Term Goal #1 Met Date   06/07/15 [Patient states she is on point with all of her appointments.]   Interventions for Short Term Goal #2   RNCM educated patient on [facility] resources and will send [facility] patient calendar.       Assessment: Referral received on 05/12/15. Referral source: [name] at [facility]. States patient has chronic pain and depression. Patient with recent grief and loss due to husband's death in 01-Apr-2015. States the patient reports that she is confused about her options for care, risks and benefits. Patient wants to manage pain and participate in PT. Patient has barriers and does not know what to do. Services requested: [facility]. Patient goals met and will proceed with case closure.  Plan: RNCM will send patient's primary MD case closure letter due to goals met.  RNCM will send case closure due to patient goals met request to [place] at [place] [facility].    Barrie Sigmund H. Annia Friendly, BSN, [place] [facility] Telephonic CM Phone: [phone] Fax: [phone]

## 2015-07-20 DIAGNOSIS — N393 Stress incontinence (female) (male): Secondary | ICD-10-CM | POA: Diagnosis not present

## 2015-07-20 DIAGNOSIS — M545 Low back pain: Secondary | ICD-10-CM | POA: Diagnosis not present

## 2015-07-20 DIAGNOSIS — G8929 Other chronic pain: Secondary | ICD-10-CM | POA: Diagnosis not present

## 2015-07-20 DIAGNOSIS — M461 Sacroiliitis, not elsewhere classified: Secondary | ICD-10-CM | POA: Diagnosis not present

## 2015-07-20 DIAGNOSIS — N8184 Pelvic muscle wasting: Secondary | ICD-10-CM | POA: Diagnosis not present

## 2015-07-27 DIAGNOSIS — N8184 Pelvic muscle wasting: Secondary | ICD-10-CM | POA: Diagnosis not present

## 2015-07-27 DIAGNOSIS — R531 Weakness: Secondary | ICD-10-CM | POA: Diagnosis not present

## 2015-07-27 DIAGNOSIS — N393 Stress incontinence (female) (male): Secondary | ICD-10-CM | POA: Diagnosis not present

## 2015-07-27 DIAGNOSIS — M461 Sacroiliitis, not elsewhere classified: Secondary | ICD-10-CM | POA: Diagnosis not present

## 2015-07-27 DIAGNOSIS — M545 Low back pain: Secondary | ICD-10-CM | POA: Diagnosis not present

## 2015-07-27 DIAGNOSIS — G8929 Other chronic pain: Secondary | ICD-10-CM | POA: Diagnosis not present

## 2015-07-27 DIAGNOSIS — M533 Sacrococcygeal disorders, not elsewhere classified: Secondary | ICD-10-CM | POA: Diagnosis not present

## 2015-08-08 DIAGNOSIS — I10 Essential (primary) hypertension: Secondary | ICD-10-CM | POA: Diagnosis not present

## 2015-08-08 DIAGNOSIS — M792 Neuralgia and neuritis, unspecified: Secondary | ICD-10-CM | POA: Diagnosis not present

## 2015-08-08 DIAGNOSIS — M545 Low back pain: Secondary | ICD-10-CM | POA: Diagnosis not present

## 2015-08-26 DIAGNOSIS — J209 Acute bronchitis, unspecified: Secondary | ICD-10-CM | POA: Diagnosis not present

## 2015-08-26 DIAGNOSIS — R062 Wheezing: Secondary | ICD-10-CM | POA: Diagnosis not present

## 2015-09-12 DIAGNOSIS — N390 Urinary tract infection, site not specified: Secondary | ICD-10-CM | POA: Diagnosis not present

## 2015-09-12 DIAGNOSIS — I10 Essential (primary) hypertension: Secondary | ICD-10-CM | POA: Diagnosis not present

## 2015-09-12 DIAGNOSIS — F329 Major depressive disorder, single episode, unspecified: Secondary | ICD-10-CM | POA: Diagnosis not present

## 2015-09-12 DIAGNOSIS — M545 Low back pain: Secondary | ICD-10-CM | POA: Diagnosis not present

## 2015-10-13 DIAGNOSIS — H2513 Age-related nuclear cataract, bilateral: Secondary | ICD-10-CM | POA: Diagnosis not present

## 2015-10-20 DIAGNOSIS — H2513 Age-related nuclear cataract, bilateral: Secondary | ICD-10-CM | POA: Diagnosis not present

## 2015-10-24 NOTE — Discharge Instructions (Signed)

## 2015-10-26 ENCOUNTER — Encounter
Admission: RE | Disposition: A | Discharge status: Home / Self Care | Payer: Self-pay | Source: Ambulatory Visit | Attending: Ophthalmology

## 2015-10-26 ENCOUNTER — Ambulatory Visit: Payer: Commercial Managed Care - HMO | Admitting: Anesthesiology

## 2015-10-26 ENCOUNTER — Ambulatory Visit
Admission: RE | Admit: 2015-10-26 | Discharge: 2015-10-26 | Disposition: A | Discharge status: Home / Self Care | Payer: Commercial Managed Care - HMO | Source: Ambulatory Visit | Attending: Ophthalmology | Admitting: Ophthalmology

## 2015-10-26 DIAGNOSIS — Z8542 Personal history of malignant neoplasm of other parts of uterus: Secondary | ICD-10-CM | POA: Insufficient documentation

## 2015-10-26 DIAGNOSIS — I10 Essential (primary) hypertension: Secondary | ICD-10-CM | POA: Insufficient documentation

## 2015-10-26 DIAGNOSIS — Z90710 Acquired absence of both cervix and uterus: Secondary | ICD-10-CM | POA: Insufficient documentation

## 2015-10-26 DIAGNOSIS — K219 Gastro-esophageal reflux disease without esophagitis: Secondary | ICD-10-CM | POA: Insufficient documentation

## 2015-10-26 DIAGNOSIS — F419 Anxiety disorder, unspecified: Secondary | ICD-10-CM | POA: Insufficient documentation

## 2015-10-26 DIAGNOSIS — Z6841 Body Mass Index (BMI) 40.0 and over, adult: Secondary | ICD-10-CM | POA: Insufficient documentation

## 2015-10-26 DIAGNOSIS — F329 Major depressive disorder, single episode, unspecified: Secondary | ICD-10-CM | POA: Insufficient documentation

## 2015-10-26 DIAGNOSIS — Z86718 Personal history of other venous thrombosis and embolism: Secondary | ICD-10-CM | POA: Insufficient documentation

## 2015-10-26 DIAGNOSIS — G629 Polyneuropathy, unspecified: Secondary | ICD-10-CM | POA: Insufficient documentation

## 2015-10-26 DIAGNOSIS — H2512 Age-related nuclear cataract, left eye: Secondary | ICD-10-CM | POA: Diagnosis not present

## 2015-10-26 DIAGNOSIS — H2513 Age-related nuclear cataract, bilateral: Secondary | ICD-10-CM | POA: Diagnosis not present

## 2015-10-26 HISTORY — DX: Acute embolism and thrombosis of unspecified deep veins of unspecified lower extremity: I82.409

## 2015-10-26 HISTORY — DX: Unspecified osteoarthritis, unspecified site: M19.90

## 2015-10-26 HISTORY — DX: Gastro-esophageal reflux disease without esophagitis: K21.9

## 2015-10-26 HISTORY — DX: Other symptoms and signs involving the musculoskeletal system: R29.898

## 2015-10-26 HISTORY — PX: CATARACT EXTRACTION W/PHACO: SHX586

## 2015-10-26 SURGERY — PHACOEMULSIFICATION, CATARACT, WITH IOL INSERTION
Anesthesia: Monitor Anesthesia Care | Site: Eye | Laterality: Left | Wound class: Clean

## 2015-10-26 MED ORDER — MIDAZOLAM HCL 2 MG/2ML IJ SOLN
INTRAMUSCULAR | Status: DC | PRN
  Administered 2015-10-26: 2 mg via INTRAVENOUS

## 2015-10-26 MED ORDER — POVIDONE-IODINE 5 % OP SOLN
1.0000 | OPHTHALMIC | Status: DC | PRN
Start: 2015-10-26 — End: 2015-10-26
  Administered 2015-10-26: 1 via OPHTHALMIC

## 2015-10-26 MED ORDER — NA HYALUR & NA CHOND-NA HYALUR 0.4-0.35 ML IO KIT
PACK | INTRAOCULAR | Status: DC | PRN
  Administered 2015-10-26: 1 mL via INTRAOCULAR

## 2015-10-26 MED ORDER — TETRACAINE HCL 0.5 % OP SOLN
1.0000 [drp] | OPHTHALMIC | Status: DC | PRN
  Administered 2015-10-26: 1 [drp] via OPHTHALMIC

## 2015-10-26 MED ORDER — LIDOCAINE HCL (PF) 4 % IJ SOLN
INTRAOCULAR | Status: DC | PRN
  Administered 2015-10-26: 1 mL via OPHTHALMIC

## 2015-10-26 MED ORDER — BRIMONIDINE TARTRATE 0.2 % OP SOLN
OPHTHALMIC | Status: DC | PRN
  Administered 2015-10-26: 1 [drp] via OPHTHALMIC

## 2015-10-26 MED ORDER — FENTANYL CITRATE (PF) 100 MCG/2ML IJ SOLN
INTRAMUSCULAR | Status: DC | PRN
  Administered 2015-10-26: 50 ug via INTRAVENOUS

## 2015-10-26 MED ORDER — ACETAMINOPHEN 160 MG/5ML PO SOLN: 325.0000 mg | ORAL | Status: DC | PRN

## 2015-10-26 MED ORDER — TIMOLOL MALEATE 0.5 % OP SOLN
OPHTHALMIC | Status: DC | PRN
  Administered 2015-10-26: 1 [drp] via OPHTHALMIC

## 2015-10-26 MED ORDER — ACETAMINOPHEN 325 MG PO TABS: 325.0000 mg | ORAL_TABLET | ORAL | Status: DC | PRN

## 2015-10-26 MED ORDER — ARMC OPHTHALMIC DILATING GEL
1.0000 "application " | OPHTHALMIC | Status: DC | PRN
  Administered 2015-10-26 (×2): 1 via OPHTHALMIC

## 2015-10-26 MED ORDER — CEFUROXIME OPHTHALMIC INJECTION 1 MG/0.1 ML
INJECTION | OPHTHALMIC | Status: DC | PRN
  Administered 2015-10-26: 0.1 mL via INTRACAMERAL

## 2015-10-26 MED ORDER — LACTATED RINGERS IV SOLN: INTRAVENOUS | Status: DC

## 2015-10-26 MED ORDER — EPINEPHRINE HCL 1 MG/ML IJ SOLN
INTRAOCULAR | Status: DC | PRN
  Administered 2015-10-26: 43 mL via OPHTHALMIC

## 2015-10-26 SURGICAL SUPPLY — 27 items
CANNULA ANT/CHMB 27G (MISCELLANEOUS) ×1 IMPLANT
CANNULA ANT/CHMB 27GA (MISCELLANEOUS) ×3 IMPLANT
CARTRIDGE ABBOTT (MISCELLANEOUS) IMPLANT
GLOVE SURG LX 7.5 STRW (GLOVE) ×2
GLOVE SURG LX STRL 7.5 STRW (GLOVE) ×1 IMPLANT
GLOVE SURG TRIUMPH 8.0 PF LTX (GLOVE) ×3 IMPLANT
GOWN STRL REUS W/ TWL LRG LVL3 (GOWN DISPOSABLE) ×2 IMPLANT
GOWN STRL REUS W/TWL LRG LVL3 (GOWN DISPOSABLE) ×6
LENS IOL TECNIS ITEC 18.0 (Intraocular Lens) ×2 IMPLANT
MARKER SKIN DUAL TIP RULER LAB (MISCELLANEOUS) ×3 IMPLANT
NDL FILTER BLUNT 18X1 1/2 (NEEDLE) ×1 IMPLANT
NDL RETROBULBAR .5 NSTRL (NEEDLE) IMPLANT
NEEDLE FILTER BLUNT 18X 1/2SAF (NEEDLE) ×2
NEEDLE FILTER BLUNT 18X1 1/2 (NEEDLE) ×1 IMPLANT
PACK CATARACT BRASINGTON (MISCELLANEOUS) ×3 IMPLANT
PACK EYE AFTER SURG (MISCELLANEOUS) ×3 IMPLANT
PACK OPTHALMIC (MISCELLANEOUS) ×3 IMPLANT
RING MALYGIN 7.0 (MISCELLANEOUS) IMPLANT
SUT ETHILON 10-0 CS-B-6CS-B-6 (SUTURE)
SUT VICRYL  9 0 (SUTURE)
SUT VICRYL 9 0 (SUTURE) IMPLANT
SUTURE EHLN 10-0 CS-B-6CS-B-6 (SUTURE) IMPLANT
SYR 3ML LL SCALE MARK (SYRINGE) ×3 IMPLANT
SYR 5ML LL (SYRINGE) ×3 IMPLANT
SYR TB 1ML LUER SLIP (SYRINGE) ×3 IMPLANT
WATER STERILE IRR 250ML POUR (IV SOLUTION) ×3 IMPLANT
WIPE NON LINTING 3.25X3.25 (MISCELLANEOUS) ×3 IMPLANT

## 2015-10-26 NOTE — Anesthesia Preprocedure Evaluation (Signed)
Anesthesia Evaluation  Patient identified by MRN, date of birth, ID band  Reviewed: Allergy & Precautions, H&P , NPO status , Patient's Chart, lab work & pertinent test results  Airway Mallampati: III  TM Distance: >3 FB Neck ROM: full    Dental no notable dental hx. (+) Edentulous Lower, Upper Dentures   Pulmonary shortness of breath,    Pulmonary exam normal        Cardiovascular hypertension,  Rhythm:regular Rate:Normal     Neuro/Psych    GI/Hepatic   Endo/Other  Morbid obesity  Renal/GU      Musculoskeletal   Abdominal   Peds  Hematology   Anesthesia Other Findings   Reproductive/Obstetrics                             Anesthesia Physical Anesthesia Plan  ASA: II  Anesthesia Plan: MAC   Post-op Pain Management:    Induction:   Airway Management Planned:   Additional Equipment:   Intra-op Plan:   Post-operative Plan:   Informed Consent: I have reviewed the patients History and Physical, chart, labs and discussed the procedure including the risks, benefits and alternatives for the proposed anesthesia with the patient or authorized representative who has indicated his/her understanding and acceptance.     Plan Discussed with: CRNA  Anesthesia Plan Comments:         Anesthesia Quick Evaluation

## 2015-10-26 NOTE — Anesthesia Postprocedure Evaluation (Signed)
Anesthesia Post Note  Patient: Diana Bernard  Procedure(s) Performed: Procedure(s) (LRB): CATARACT EXTRACTION PHACO AND INTRAOCULAR LENS PLACEMENT (IOC) LEFT EYE (Left)  Patient location during evaluation: PACU Anesthesia Type: MAC Level of consciousness: awake and alert and oriented Pain management: satisfactory to patient Vital Signs Assessment: post-procedure vital signs reviewed and stable Respiratory status: spontaneous breathing, nonlabored ventilation and respiratory function stable Cardiovascular status: blood pressure returned to baseline and stable Postop Assessment: Adequate PO intake and No signs of nausea or vomiting Anesthetic complications: no    Raliegh Ip

## 2015-10-26 NOTE — Op Note (Signed)
OPERATIVE NOTE  Diana Bernard [id number] 10/26/2015   PREOPERATIVE DIAGNOSIS:  Nuclear sclerotic cataract left eye. H25.12   POSTOPERATIVE DIAGNOSIS:    Nuclear sclerotic cataract left eye.     PROCEDURE:  Phacoemusification with posterior chamber intraocular lens placement of the left eye   LENS:   Implant Name Type Inv. Item Serial No. Manufacturer Lot No. LRB No. Used  LENS IOL DIOP 18.0 - [id number] Intraocular Lens LENS IOL DIOP 18.0 [id number]   Left 1        ULTRASOUND TIME: 13.5  % of 0 minutes 44 seconds, CDE 5.6  SURGEON:  Wyonia Hough, MD   ANESTHESIA:  Topical with tetracaine drops and 2% Xylocaine jelly, augmented with 1% preservative-free intracameral lidocaine.    COMPLICATIONS:  None.   DESCRIPTION OF PROCEDURE:  The patient was identified in the holding room and transported to the operating room and placed in the supine position under the operating microscope.  The left eye was identified as the operative eye and it was prepped and draped in the usual sterile ophthalmic fashion.   A 1 millimeter clear-corneal paracentesis was made at the 1:30 position.  0.5 ml of preservative-free 1% lidocaine was injected into the anterior chamber.  The anterior chamber was filled with Viscoat viscoelastic.  A 2.4 millimeter keratome was used to make a near-clear corneal incision at the 10:30 position.  .  A curvilinear capsulorrhexis was made with a cystotome and capsulorrhexis forceps.  Balanced salt solution was used to hydrodissect and hydrodelineate the nucleus.   Phacoemulsification was then used in stop and chop fashion to remove the lens nucleus and epinucleus.  The remaining cortex was then removed using the irrigation and aspiration handpiece. Provisc was then placed into the capsular bag to distend it for lens placement.  A lens was then injected into the capsular bag.  The remaining viscoelastic was aspirated.   Wounds were hydrated with balanced salt  solution.  The anterior chamber was inflated to a physiologic pressure with balanced salt solution.  No wound leaks were noted. Cefuroxime 0.1 ml of a 10mg /ml solution was injected into the anterior chamber for a dose of 1 mg of intracameral antibiotic at the completion of the case.   Timolol and Brimonidine drops were applied to the eye.  The patient was taken to the recovery room in stable condition without complications of anesthesia or surgery.  [name] 10/26/2015, 11:38 AM

## 2015-10-26 NOTE — H&P (Signed)
  The History and Physical notes are on paper, have been signed, and are to be scanned. The patient remains stable and unchanged from the H&P.   Previous H&P reviewed, patient examined, and there are no changes.  Diana Bernard 10/26/2015 10:40 AM

## 2015-10-26 NOTE — Transfer of Care (Signed)
Immediate Anesthesia Transfer of Care Note  Patient: Diana Bernard  Procedure(s) Performed: Procedure(s): CATARACT EXTRACTION PHACO AND INTRAOCULAR LENS PLACEMENT (IOC) LEFT EYE (Left)  Patient Location: PACU  Anesthesia Type: MAC  Level of Consciousness: awake, alert  and patient cooperative  Airway and Oxygen Therapy: Patient Spontanous Breathing and Patient connected to supplemental oxygen  Post-op Assessment: Post-op Vital signs reviewed, Patient's Cardiovascular Status Stable, Respiratory Function Stable, Patent Airway and No signs of Nausea or vomiting  Post-op Vital Signs: Reviewed and stable  Complications: No apparent anesthesia complications

## 2015-10-26 NOTE — Anesthesia Procedure Notes (Signed)
Procedure Name: MAC Performed by: Trendon Zaring Pre-anesthesia Checklist: Patient identified, Emergency Drugs available, Suction available, Timeout performed and Patient being monitored Patient Re-evaluated:Patient Re-evaluated prior to inductionOxygen Delivery Method: Nasal cannula Placement Confirmation: positive ETCO2     

## 2015-10-27 ENCOUNTER — Encounter: Payer: Self-pay | Admitting: Ophthalmology

## 2015-11-18 DIAGNOSIS — K5792 Diverticulitis of intestine, part unspecified, without perforation or abscess without bleeding: Secondary | ICD-10-CM | POA: Diagnosis not present

## 2015-11-30 DIAGNOSIS — H2511 Age-related nuclear cataract, right eye: Secondary | ICD-10-CM | POA: Diagnosis not present

## 2015-12-02 NOTE — Discharge Instructions (Signed)

## 2015-12-07 ENCOUNTER — Ambulatory Visit
Admission: RE | Admit: 2015-12-07 | Discharge: 2015-12-07 | Disposition: A | Discharge status: Home / Self Care | Payer: Commercial Managed Care - HMO | Source: Ambulatory Visit | Attending: Ophthalmology | Admitting: Ophthalmology

## 2015-12-07 ENCOUNTER — Ambulatory Visit: Payer: Commercial Managed Care - HMO | Admitting: Anesthesiology

## 2015-12-07 ENCOUNTER — Encounter
Admission: RE | Disposition: A | Discharge status: Home / Self Care | Payer: Self-pay | Source: Ambulatory Visit | Attending: Ophthalmology

## 2015-12-07 DIAGNOSIS — Z8719 Personal history of other diseases of the digestive system: Secondary | ICD-10-CM | POA: Insufficient documentation

## 2015-12-07 DIAGNOSIS — H2511 Age-related nuclear cataract, right eye: Secondary | ICD-10-CM | POA: Insufficient documentation

## 2015-12-07 DIAGNOSIS — M1990 Unspecified osteoarthritis, unspecified site: Secondary | ICD-10-CM | POA: Insufficient documentation

## 2015-12-07 DIAGNOSIS — F329 Major depressive disorder, single episode, unspecified: Secondary | ICD-10-CM | POA: Diagnosis not present

## 2015-12-07 DIAGNOSIS — G709 Myoneural disorder, unspecified: Secondary | ICD-10-CM | POA: Diagnosis not present

## 2015-12-07 DIAGNOSIS — F419 Anxiety disorder, unspecified: Secondary | ICD-10-CM | POA: Insufficient documentation

## 2015-12-07 DIAGNOSIS — Z8542 Personal history of malignant neoplasm of other parts of uterus: Secondary | ICD-10-CM | POA: Insufficient documentation

## 2015-12-07 DIAGNOSIS — K219 Gastro-esophageal reflux disease without esophagitis: Secondary | ICD-10-CM | POA: Diagnosis not present

## 2015-12-07 DIAGNOSIS — J45909 Unspecified asthma, uncomplicated: Secondary | ICD-10-CM | POA: Insufficient documentation

## 2015-12-07 DIAGNOSIS — I10 Essential (primary) hypertension: Secondary | ICD-10-CM | POA: Diagnosis not present

## 2015-12-07 DIAGNOSIS — Z86718 Personal history of other venous thrombosis and embolism: Secondary | ICD-10-CM | POA: Insufficient documentation

## 2015-12-07 DIAGNOSIS — R0602 Shortness of breath: Secondary | ICD-10-CM | POA: Diagnosis not present

## 2015-12-07 HISTORY — PX: CATARACT EXTRACTION W/PHACO: SHX586

## 2015-12-07 SURGERY — PHACOEMULSIFICATION, CATARACT, WITH IOL INSERTION
Anesthesia: Monitor Anesthesia Care | Laterality: Right | Wound class: Clean

## 2015-12-07 MED ORDER — BRIMONIDINE TARTRATE 0.2 % OP SOLN
OPHTHALMIC | Status: DC | PRN
  Administered 2015-12-07: 1 [drp] via OPHTHALMIC

## 2015-12-07 MED ORDER — ARMC OPHTHALMIC DILATING GEL
1.0000 "application " | OPHTHALMIC | Status: DC | PRN
  Administered 2015-12-07 (×2): 1 via OPHTHALMIC

## 2015-12-07 MED ORDER — FENTANYL CITRATE (PF) 100 MCG/2ML IJ SOLN
INTRAMUSCULAR | Status: DC | PRN
  Administered 2015-12-07: 50 ug via INTRAVENOUS

## 2015-12-07 MED ORDER — MIDAZOLAM HCL 2 MG/2ML IJ SOLN
INTRAMUSCULAR | Status: DC | PRN
  Administered 2015-12-07 (×2): 1 mg via INTRAVENOUS

## 2015-12-07 MED ORDER — EPINEPHRINE HCL 1 MG/ML IJ SOLN
INTRAOCULAR | Status: DC | PRN
  Administered 2015-12-07: 70 mL via OPHTHALMIC

## 2015-12-07 MED ORDER — CEFUROXIME OPHTHALMIC INJECTION 1 MG/0.1 ML
INJECTION | OPHTHALMIC | Status: DC | PRN
  Administered 2015-12-07: 0.1 mL via INTRACAMERAL

## 2015-12-07 MED ORDER — BALANCED SALT IO SOLN
INTRAOCULAR | Status: DC | PRN
  Administered 2015-12-07: 1 mL via OPHTHALMIC

## 2015-12-07 MED ORDER — TETRACAINE HCL 0.5 % OP SOLN
1.0000 [drp] | OPHTHALMIC | Status: DC | PRN
  Administered 2015-12-07: 1 [drp] via OPHTHALMIC

## 2015-12-07 MED ORDER — NA HYALUR & NA CHOND-NA HYALUR 0.4-0.35 ML IO KIT
PACK | INTRAOCULAR | Status: DC | PRN
  Administered 2015-12-07: 1 mL via INTRAOCULAR

## 2015-12-07 MED ORDER — ACETAMINOPHEN 325 MG PO TABS
650.0000 mg | ORAL_TABLET | Freq: Once | ORAL | Status: AC
  Administered 2015-12-07: 650 mg via ORAL

## 2015-12-07 MED ORDER — TIMOLOL MALEATE 0.5 % OP SOLN
OPHTHALMIC | Status: DC | PRN
  Administered 2015-12-07: 1 [drp] via OPHTHALMIC

## 2015-12-07 MED ORDER — POVIDONE-IODINE 5 % OP SOLN
1.0000 "application " | OPHTHALMIC | Status: DC | PRN
  Administered 2015-12-07: 1 via OPHTHALMIC

## 2015-12-07 SURGICAL SUPPLY — 27 items
CANNULA ANT/CHMB 27G (MISCELLANEOUS) ×1 IMPLANT
CANNULA ANT/CHMB 27GA (MISCELLANEOUS) ×2 IMPLANT
CARTRIDGE ABBOTT (MISCELLANEOUS) IMPLANT
GLOVE SURG LX 7.5 STRW (GLOVE) ×1
GLOVE SURG LX STRL 7.5 STRW (GLOVE) ×1 IMPLANT
GLOVE SURG TRIUMPH 8.0 PF LTX (GLOVE) ×2 IMPLANT
GOWN STRL REUS W/ TWL LRG LVL3 (GOWN DISPOSABLE) ×2 IMPLANT
GOWN STRL REUS W/TWL LRG LVL3 (GOWN DISPOSABLE) ×4
LENS IOL TECNIS ITEC 17.5 (Intraocular Lens) ×1 IMPLANT
MARKER SKIN DUAL TIP RULER LAB (MISCELLANEOUS) ×2 IMPLANT
NDL FILTER BLUNT 18X1 1/2 (NEEDLE) ×1 IMPLANT
NDL RETROBULBAR .5 NSTRL (NEEDLE) IMPLANT
NEEDLE FILTER BLUNT 18X 1/2SAF (NEEDLE) ×1
NEEDLE FILTER BLUNT 18X1 1/2 (NEEDLE) ×1 IMPLANT
PACK CATARACT BRASINGTON (MISCELLANEOUS) ×2 IMPLANT
PACK EYE AFTER SURG (MISCELLANEOUS) ×2 IMPLANT
PACK OPTHALMIC (MISCELLANEOUS) ×2 IMPLANT
RING MALYGIN 7.0 (MISCELLANEOUS) IMPLANT
SUT ETHILON 10-0 CS-B-6CS-B-6 (SUTURE)
SUT VICRYL  9 0 (SUTURE)
SUT VICRYL 9 0 (SUTURE) IMPLANT
SUTURE EHLN 10-0 CS-B-6CS-B-6 (SUTURE) IMPLANT
SYR 3ML LL SCALE MARK (SYRINGE) ×2 IMPLANT
SYR 5ML LL (SYRINGE) ×2 IMPLANT
SYR TB 1ML LUER SLIP (SYRINGE) ×2 IMPLANT
WATER STERILE IRR 250ML POUR (IV SOLUTION) ×2 IMPLANT
WIPE NON LINTING 3.25X3.25 (MISCELLANEOUS) ×2 IMPLANT

## 2015-12-07 NOTE — Anesthesia Procedure Notes (Signed)
Procedure Name: MAC Date/Time: 12/07/2015 8:53 AM Performed by: Janna Arch Pre-anesthesia Checklist: Patient identified, Emergency Drugs available, Suction available, Patient being monitored and Timeout performed Patient Re-evaluated:Patient Re-evaluated prior to inductionOxygen Delivery Method: Nasal cannula

## 2015-12-07 NOTE — H&P (Signed)
  The History and Physical notes are on paper, have been signed, and are to be scanned. The patient remains stable and unchanged from the H&P.   Previous H&P reviewed, patient examined, and there are no changes.  Diana Bernard 12/07/2015 8:19 AM

## 2015-12-07 NOTE — Anesthesia Preprocedure Evaluation (Addendum)
Anesthesia Evaluation  Patient identified by MRN, date of birth, ID band Patient awake    Reviewed: Allergy & Precautions, NPO status , Patient's Chart, lab work & pertinent test results  Airway Mallampati: II  TM Distance: >3 FB Neck ROM: Full    Dental  (+) Upper Dentures   Pulmonary shortness of breath,    Pulmonary exam normal        Cardiovascular hypertension, Pt. on medications Normal cardiovascular exam     Neuro/Psych PSYCHIATRIC DISORDERS Depression  Neuromuscular disease    GI/Hepatic Neg liver ROS, GERD  Medicated and Controlled,  Endo/Other  negative endocrine ROS  Renal/GU negative Renal ROS     Musculoskeletal  (+) Arthritis , Osteoarthritis,    Abdominal   Peds  Hematology   Anesthesia Other Findings   Reproductive/Obstetrics                            Anesthesia Physical Anesthesia Plan  ASA: II  Anesthesia Plan: MAC   Post-op Pain Management:    Induction: Intravenous  Airway Management Planned:   Additional Equipment:   Intra-op Plan:   Post-operative Plan:   Informed Consent: I have reviewed the patients History and Physical, chart, labs and discussed the procedure including the risks, benefits and alternatives for the proposed anesthesia with the patient or authorized representative who has indicated his/her understanding and acceptance.     Plan Discussed with: CRNA  Anesthesia Plan Comments:         Anesthesia Quick Evaluation

## 2015-12-07 NOTE — Transfer of Care (Signed)
Immediate Anesthesia Transfer of Care Note  Patient: Diana Bernard  Procedure(s) Performed: Procedure(s): CATARACT EXTRACTION PHACO AND INTRAOCULAR LENS PLACEMENT (IOC) (Right)  Patient Location: PACU  Anesthesia Type: MAC  Level of Consciousness: awake, alert  and patient cooperative  Airway and Oxygen Therapy: Patient Spontanous Breathing and Patient connected to supplemental oxygen  Post-op Assessment: Post-op Vital signs reviewed, Patient's Cardiovascular Status Stable, Respiratory Function Stable, Patent Airway and No signs of Nausea or vomiting  Post-op Vital Signs: Reviewed and stable  Complications: No apparent anesthesia complications

## 2015-12-07 NOTE — Op Note (Signed)
LOCATION:  [place]   PREOPERATIVE DIAGNOSIS:    Nuclear sclerotic cataract right eye. H25.11   POSTOPERATIVE DIAGNOSIS:  Nuclear sclerotic cataract right eye.     PROCEDURE:  Phacoemusification with posterior chamber intraocular lens placement of the right eye   LENS:   Implant Name Type Inv. Item Serial No. Manufacturer Lot No. LRB No. Used  LENS IOL DIOP 17.5 - [id number] Intraocular Lens LENS IOL DIOP 17.5 [id number]   Right 1        ULTRASOUND TIME: 11 % of 1 minutes, 4 seconds.  CDE 7.3   SURGEON:  Wyonia Hough, MD   ANESTHESIA:  Topical with tetracaine drops and 2% Xylocaine jelly, augmented with 1% preservative-free intracameral lidocaine.    COMPLICATIONS:  None.   DESCRIPTION OF PROCEDURE:  The patient was identified in the holding room and transported to the operating room and placed in the supine position under the operating microscope.  The right eye was identified as the operative eye and it was prepped and draped in the usual sterile ophthalmic fashion.   A 1 millimeter clear-corneal paracentesis was made at the 12:00 position.  0.5 ml of preservative-free 1% lidocaine was injected into the anterior chamber. The anterior chamber was filled with Viscoat viscoelastic.  A 2.4 millimeter keratome was used to make a near-clear corneal incision at the 9:00 position.  A curvilinear capsulorrhexis was made with a cystotome and capsulorrhexis forceps.  Balanced salt solution was used to hydrodissect and hydrodelineate the nucleus.   Phacoemulsification was then used in stop and chop fashion to remove the lens nucleus and epinucleus.  The remaining cortex was then removed using the irrigation and aspiration handpiece. Provisc was then placed into the capsular bag to distend it for lens placement.  A lens was then injected into the capsular bag.  The remaining viscoelastic was aspirated.   Wounds were hydrated with balanced salt solution.  The anterior  chamber was inflated to a physiologic pressure with balanced salt solution.  No wound leaks were noted. Cefuroxime 0.1 ml of a 10mg /ml solution was injected into the anterior chamber for a dose of 1 mg of intracameral antibiotic at the completion of the case.   Timolol and Brimonidine drops were applied to the eye.  The patient was taken to the recovery room in stable condition without complications of anesthesia or surgery.   Diana Bernard 12/07/2015, 9:13 AM

## 2015-12-07 NOTE — Anesthesia Postprocedure Evaluation (Signed)
Anesthesia Post Note  Patient: Diana Bernard  Procedure(s) Performed: Procedure(s) (LRB): CATARACT EXTRACTION PHACO AND INTRAOCULAR LENS PLACEMENT (IOC) (Right)  Patient location during evaluation: PACU Anesthesia Type: MAC Level of consciousness: awake and alert and oriented Pain management: pain level controlled Vital Signs Assessment: post-procedure vital signs reviewed and stable Respiratory status: spontaneous breathing and nonlabored ventilation Cardiovascular status: stable Postop Assessment: no signs of nausea or vomiting and adequate PO intake Anesthetic complications: no    Estill Batten

## 2015-12-08 ENCOUNTER — Encounter: Payer: Self-pay | Admitting: Ophthalmology

## 2015-12-12 DIAGNOSIS — N390 Urinary tract infection, site not specified: Secondary | ICD-10-CM | POA: Diagnosis not present

## 2015-12-12 DIAGNOSIS — I10 Essential (primary) hypertension: Secondary | ICD-10-CM | POA: Diagnosis not present

## 2015-12-12 DIAGNOSIS — M5117 Intervertebral disc disorders with radiculopathy, lumbosacral region: Secondary | ICD-10-CM | POA: Diagnosis not present

## 2015-12-12 DIAGNOSIS — R300 Dysuria: Secondary | ICD-10-CM | POA: Diagnosis not present

## 2015-12-12 DIAGNOSIS — R609 Edema, unspecified: Secondary | ICD-10-CM | POA: Diagnosis not present

## 2015-12-16 DIAGNOSIS — R2681 Unsteadiness on feet: Secondary | ICD-10-CM | POA: Diagnosis not present

## 2015-12-16 DIAGNOSIS — S32020D Wedge compression fracture of second lumbar vertebra, subsequent encounter for fracture with routine healing: Secondary | ICD-10-CM | POA: Diagnosis not present

## 2015-12-16 DIAGNOSIS — Z9889 Other specified postprocedural states: Secondary | ICD-10-CM | POA: Diagnosis not present

## 2015-12-16 DIAGNOSIS — M5136 Other intervertebral disc degeneration, lumbar region: Secondary | ICD-10-CM | POA: Diagnosis not present

## 2015-12-24 DIAGNOSIS — M4602 Spinal enthesopathy, cervical region: Secondary | ICD-10-CM | POA: Diagnosis not present

## 2015-12-24 DIAGNOSIS — M50222 Other cervical disc displacement at C5-C6 level: Secondary | ICD-10-CM | POA: Diagnosis not present

## 2015-12-24 DIAGNOSIS — M5125 Other intervertebral disc displacement, thoracolumbar region: Secondary | ICD-10-CM | POA: Diagnosis not present

## 2015-12-24 DIAGNOSIS — M5134 Other intervertebral disc degeneration, thoracic region: Secondary | ICD-10-CM | POA: Diagnosis not present

## 2015-12-24 DIAGNOSIS — M5032 Other cervical disc degeneration, mid-cervical region, unspecified level: Secondary | ICD-10-CM | POA: Diagnosis not present

## 2015-12-24 DIAGNOSIS — M40294 Other kyphosis, thoracic region: Secondary | ICD-10-CM | POA: Diagnosis not present

## 2015-12-24 DIAGNOSIS — M9971 Connective tissue and disc stenosis of intervertebral foramina of cervical region: Secondary | ICD-10-CM | POA: Diagnosis not present

## 2015-12-24 DIAGNOSIS — M50223 Other cervical disc displacement at C6-C7 level: Secondary | ICD-10-CM | POA: Diagnosis not present

## 2015-12-24 DIAGNOSIS — M4805 Spinal stenosis, thoracolumbar region: Secondary | ICD-10-CM | POA: Diagnosis not present

## 2015-12-24 DIAGNOSIS — M5145 Schmorl's nodes, thoracolumbar region: Secondary | ICD-10-CM | POA: Diagnosis not present

## 2015-12-24 DIAGNOSIS — M47812 Spondylosis without myelopathy or radiculopathy, cervical region: Secondary | ICD-10-CM | POA: Diagnosis not present

## 2015-12-24 DIAGNOSIS — R2681 Unsteadiness on feet: Secondary | ICD-10-CM | POA: Diagnosis not present

## 2015-12-24 DIAGNOSIS — M4802 Spinal stenosis, cervical region: Secondary | ICD-10-CM | POA: Diagnosis not present

## 2015-12-26 DIAGNOSIS — N390 Urinary tract infection, site not specified: Secondary | ICD-10-CM | POA: Diagnosis not present

## 2015-12-26 DIAGNOSIS — F411 Generalized anxiety disorder: Secondary | ICD-10-CM | POA: Diagnosis not present

## 2015-12-26 DIAGNOSIS — M792 Neuralgia and neuritis, unspecified: Secondary | ICD-10-CM | POA: Diagnosis not present

## 2015-12-26 DIAGNOSIS — F329 Major depressive disorder, single episode, unspecified: Secondary | ICD-10-CM | POA: Diagnosis not present

## 2015-12-26 DIAGNOSIS — M5117 Intervertebral disc disorders with radiculopathy, lumbosacral region: Secondary | ICD-10-CM | POA: Diagnosis not present

## 2015-12-28 DIAGNOSIS — M4310 Spondylolisthesis, site unspecified: Secondary | ICD-10-CM | POA: Diagnosis not present

## 2015-12-28 DIAGNOSIS — S32000D Wedge compression fracture of unspecified lumbar vertebra, subsequent encounter for fracture with routine healing: Secondary | ICD-10-CM | POA: Diagnosis not present

## 2015-12-28 DIAGNOSIS — M50322 Other cervical disc degeneration at C5-C6 level: Secondary | ICD-10-CM | POA: Diagnosis not present

## 2015-12-28 DIAGNOSIS — M8588 Other specified disorders of bone density and structure, other site: Secondary | ICD-10-CM | POA: Diagnosis not present

## 2015-12-28 DIAGNOSIS — M545 Low back pain: Secondary | ICD-10-CM | POA: Diagnosis not present

## 2015-12-28 DIAGNOSIS — M50323 Other cervical disc degeneration at C6-C7 level: Secondary | ICD-10-CM | POA: Diagnosis not present

## 2015-12-28 DIAGNOSIS — M47812 Spondylosis without myelopathy or radiculopathy, cervical region: Secondary | ICD-10-CM | POA: Diagnosis not present

## 2015-12-28 DIAGNOSIS — S22020D Wedge compression fracture of second thoracic vertebra, subsequent encounter for fracture with routine healing: Secondary | ICD-10-CM | POA: Diagnosis not present

## 2015-12-28 DIAGNOSIS — M40205 Unspecified kyphosis, thoracolumbar region: Secondary | ICD-10-CM | POA: Diagnosis not present

## 2015-12-28 DIAGNOSIS — M8580 Other specified disorders of bone density and structure, unspecified site: Secondary | ICD-10-CM | POA: Diagnosis not present

## 2016-01-09 DIAGNOSIS — C541 Malignant neoplasm of endometrium: Secondary | ICD-10-CM | POA: Diagnosis not present

## 2016-01-09 DIAGNOSIS — Z79899 Other long term (current) drug therapy: Secondary | ICD-10-CM | POA: Diagnosis not present

## 2016-01-09 DIAGNOSIS — R590 Localized enlarged lymph nodes: Secondary | ICD-10-CM | POA: Diagnosis not present

## 2016-01-09 DIAGNOSIS — R599 Enlarged lymph nodes, unspecified: Secondary | ICD-10-CM | POA: Diagnosis not present

## 2016-01-09 DIAGNOSIS — K5730 Diverticulosis of large intestine without perforation or abscess without bleeding: Secondary | ICD-10-CM | POA: Diagnosis not present

## 2016-01-17 DIAGNOSIS — Z9221 Personal history of antineoplastic chemotherapy: Secondary | ICD-10-CM | POA: Diagnosis not present

## 2016-01-17 DIAGNOSIS — Z452 Encounter for adjustment and management of vascular access device: Secondary | ICD-10-CM | POA: Diagnosis not present

## 2016-01-17 DIAGNOSIS — C541 Malignant neoplasm of endometrium: Secondary | ICD-10-CM | POA: Diagnosis not present

## 2016-01-31 DIAGNOSIS — Z961 Presence of intraocular lens: Secondary | ICD-10-CM | POA: Diagnosis not present

## 2016-03-07 DIAGNOSIS — F329 Major depressive disorder, single episode, unspecified: Secondary | ICD-10-CM | POA: Diagnosis not present

## 2016-03-07 DIAGNOSIS — C55 Malignant neoplasm of uterus, part unspecified: Secondary | ICD-10-CM | POA: Diagnosis not present

## 2016-03-07 DIAGNOSIS — F411 Generalized anxiety disorder: Secondary | ICD-10-CM | POA: Diagnosis not present

## 2016-03-07 DIAGNOSIS — K5792 Diverticulitis of intestine, part unspecified, without perforation or abscess without bleeding: Secondary | ICD-10-CM | POA: Diagnosis not present

## 2016-03-07 DIAGNOSIS — I10 Essential (primary) hypertension: Secondary | ICD-10-CM | POA: Diagnosis not present

## 2016-03-07 DIAGNOSIS — Z0001 Encounter for general adult medical examination with abnormal findings: Secondary | ICD-10-CM | POA: Diagnosis not present

## 2016-03-07 DIAGNOSIS — Z23 Encounter for immunization: Secondary | ICD-10-CM | POA: Diagnosis not present

## 2016-03-07 DIAGNOSIS — R5383 Other fatigue: Secondary | ICD-10-CM | POA: Diagnosis not present

## 2016-03-07 DIAGNOSIS — M5117 Intervertebral disc disorders with radiculopathy, lumbosacral region: Secondary | ICD-10-CM | POA: Diagnosis not present

## 2016-03-12 ENCOUNTER — Other Ambulatory Visit
Admission: RE | Admit: 2016-03-12 | Discharge: 2016-03-12 | Disposition: A | Discharge status: Home / Self Care | Payer: Commercial Managed Care - HMO | Source: Ambulatory Visit | Attending: Nurse Practitioner | Admitting: Nurse Practitioner

## 2016-03-12 DIAGNOSIS — Z0001 Encounter for general adult medical examination with abnormal findings: Secondary | ICD-10-CM | POA: Insufficient documentation

## 2016-03-12 DIAGNOSIS — E559 Vitamin D deficiency, unspecified: Secondary | ICD-10-CM | POA: Insufficient documentation

## 2016-03-12 DIAGNOSIS — E079 Disorder of thyroid, unspecified: Secondary | ICD-10-CM | POA: Insufficient documentation

## 2016-03-12 DIAGNOSIS — R5383 Other fatigue: Secondary | ICD-10-CM | POA: Diagnosis not present

## 2016-03-12 LAB — TSH: TSH: 2.577 u[IU]/mL (ref 0.350–4.500)

## 2016-03-12 LAB — VITAMIN B12: VITAMIN B 12: 207 pg/mL (ref 180–914)

## 2016-03-12 LAB — IRON AND TIBC
IRON: 88 ug/dL (ref 28–170)
Saturation Ratios: 28 % (ref 10.4–31.8)
TIBC: 311 ug/dL (ref 250–450)
UIBC: 223 ug/dL

## 2016-03-12 LAB — CBC
HCT: 35.2 % (ref 35.0–47.0)
HEMOGLOBIN: 12.1 g/dL (ref 12.0–16.0)
MCH: 30.6 pg (ref 26.0–34.0)
MCHC: 34.3 g/dL (ref 32.0–36.0)
MCV: 89.4 fL (ref 80.0–100.0)
Platelets: 165 10*3/uL (ref 150–440)
RBC: 3.93 MIL/uL (ref 3.80–5.20)
RDW: 14.3 % (ref 11.5–14.5)
WBC: 4.2 10*3/uL (ref 3.6–11.0)

## 2016-03-12 LAB — FOLATE: Folate: 34 ng/mL (ref >5.9)

## 2016-03-12 LAB — T4, FREE: Free T4: 0.73 ng/dL (ref 0.61–1.12)

## 2016-03-13 LAB — VITAMIN D 25 HYDROXY (VIT D DEFICIENCY, FRACTURES): VIT D 25 HYDROXY: 38.2 ng/mL (ref 30.0–100.0)

## 2016-03-13 LAB — HEMOGLOBIN A1C
Hgb A1c MFr Bld: 5.5 % (ref 4.8–5.6)
MEAN PLASMA GLUCOSE: 111 mg/dL

## 2016-03-21 DIAGNOSIS — D519 Vitamin B12 deficiency anemia, unspecified: Secondary | ICD-10-CM | POA: Diagnosis not present

## 2016-03-28 DIAGNOSIS — D519 Vitamin B12 deficiency anemia, unspecified: Secondary | ICD-10-CM | POA: Diagnosis not present

## 2016-04-04 DIAGNOSIS — D519 Vitamin B12 deficiency anemia, unspecified: Secondary | ICD-10-CM | POA: Diagnosis not present

## 2016-04-11 DIAGNOSIS — D519 Vitamin B12 deficiency anemia, unspecified: Secondary | ICD-10-CM | POA: Diagnosis not present

## 2016-05-08 DIAGNOSIS — D519 Vitamin B12 deficiency anemia, unspecified: Secondary | ICD-10-CM | POA: Diagnosis not present

## 2016-06-13 DIAGNOSIS — I10 Essential (primary) hypertension: Secondary | ICD-10-CM | POA: Diagnosis not present

## 2016-06-13 DIAGNOSIS — C55 Malignant neoplasm of uterus, part unspecified: Secondary | ICD-10-CM | POA: Diagnosis not present

## 2016-06-13 DIAGNOSIS — R5383 Other fatigue: Secondary | ICD-10-CM | POA: Diagnosis not present

## 2016-06-13 DIAGNOSIS — R609 Edema, unspecified: Secondary | ICD-10-CM | POA: Diagnosis not present

## 2016-06-13 DIAGNOSIS — M5117 Intervertebral disc disorders with radiculopathy, lumbosacral region: Secondary | ICD-10-CM | POA: Diagnosis not present

## 2016-06-13 DIAGNOSIS — F329 Major depressive disorder, single episode, unspecified: Secondary | ICD-10-CM | POA: Diagnosis not present

## 2016-06-13 DIAGNOSIS — D519 Vitamin B12 deficiency anemia, unspecified: Secondary | ICD-10-CM | POA: Diagnosis not present

## 2016-06-27 DIAGNOSIS — D519 Vitamin B12 deficiency anemia, unspecified: Secondary | ICD-10-CM | POA: Diagnosis not present

## 2016-07-09 DIAGNOSIS — R5383 Other fatigue: Secondary | ICD-10-CM | POA: Diagnosis not present

## 2016-07-09 DIAGNOSIS — Z8542 Personal history of malignant neoplasm of other parts of uterus: Secondary | ICD-10-CM | POA: Diagnosis not present

## 2016-07-09 DIAGNOSIS — C541 Malignant neoplasm of endometrium: Secondary | ICD-10-CM | POA: Diagnosis not present

## 2016-07-09 DIAGNOSIS — Z08 Encounter for follow-up examination after completed treatment for malignant neoplasm: Secondary | ICD-10-CM | POA: Diagnosis not present

## 2016-07-09 DIAGNOSIS — Z86718 Personal history of other venous thrombosis and embolism: Secondary | ICD-10-CM | POA: Diagnosis not present

## 2016-07-11 DIAGNOSIS — D519 Vitamin B12 deficiency anemia, unspecified: Secondary | ICD-10-CM | POA: Diagnosis not present

## 2016-07-25 DIAGNOSIS — I10 Essential (primary) hypertension: Secondary | ICD-10-CM | POA: Diagnosis not present

## 2016-07-25 DIAGNOSIS — R05 Cough: Secondary | ICD-10-CM | POA: Diagnosis not present

## 2016-07-25 DIAGNOSIS — J209 Acute bronchitis, unspecified: Secondary | ICD-10-CM | POA: Diagnosis not present

## 2016-07-25 DIAGNOSIS — D519 Vitamin B12 deficiency anemia, unspecified: Secondary | ICD-10-CM | POA: Diagnosis not present

## 2016-08-08 DIAGNOSIS — D519 Vitamin B12 deficiency anemia, unspecified: Secondary | ICD-10-CM | POA: Diagnosis not present

## 2016-08-13 DIAGNOSIS — H43813 Vitreous degeneration, bilateral: Secondary | ICD-10-CM | POA: Diagnosis not present

## 2016-08-22 DIAGNOSIS — D519 Vitamin B12 deficiency anemia, unspecified: Secondary | ICD-10-CM | POA: Diagnosis not present

## 2016-08-22 DIAGNOSIS — N390 Urinary tract infection, site not specified: Secondary | ICD-10-CM | POA: Diagnosis not present

## 2016-09-10 DIAGNOSIS — D519 Vitamin B12 deficiency anemia, unspecified: Secondary | ICD-10-CM | POA: Diagnosis not present

## 2016-09-28 DIAGNOSIS — D519 Vitamin B12 deficiency anemia, unspecified: Secondary | ICD-10-CM | POA: Diagnosis not present

## 2016-10-05 IMAGING — CT CT ANGIO CHEST
2 of 6 series · 19 of 46 positions shown · IV contrast (APPLIED)
Comparison: None.

CLINICAL DATA: 68-year-old with history of uterine cancer and left
lower extremity DVT. Shortness of breath for 1 month.

EXAM:
CT ANGIOGRAPHY CHEST WITH CONTRAST
TECHNIQUE: Multidetector CT imaging of the chest was performed using the
standard protocol during bolus administration of intravenous
contrast. Multiplanar CT image reconstructions and MIPs were
obtained to evaluate the vascular anatomy.
CONTRAST:  100 mL Isovue 370

[Series 5: pe thins 1.5 · axial · 0.68mm/px · z∈[-627,-382]mm · 16 of 229 slices shown]
[im 13/229  lung]
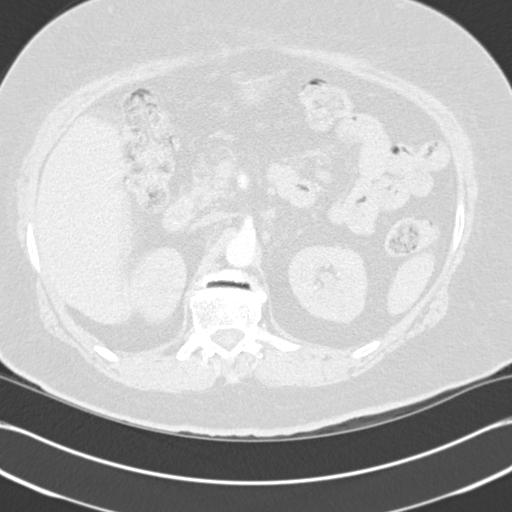
[im 25/229  soft-tissue]
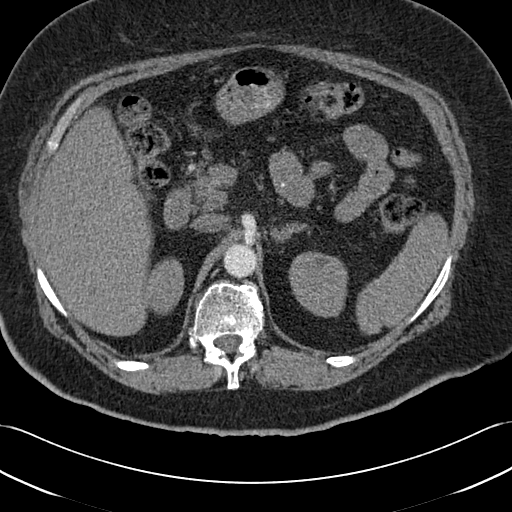
[im 37/229  lung]
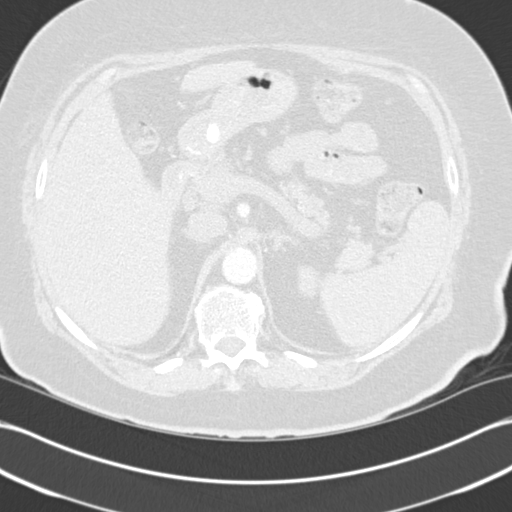
[im 49/229  soft-tissue]
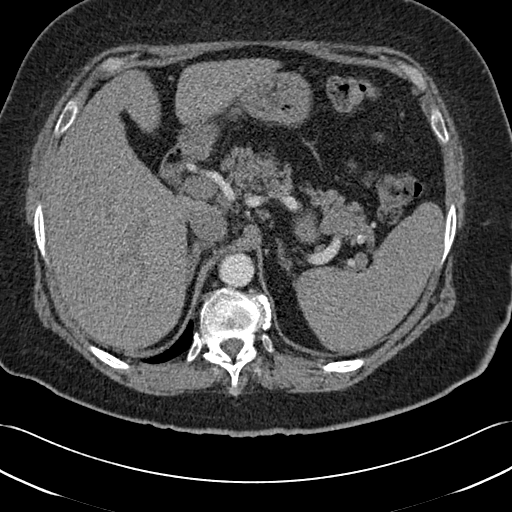
[im 73/229  lung]
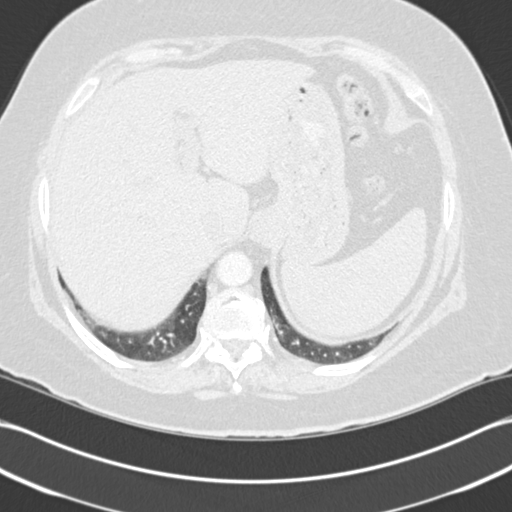
[im 85/229  soft-tissue]
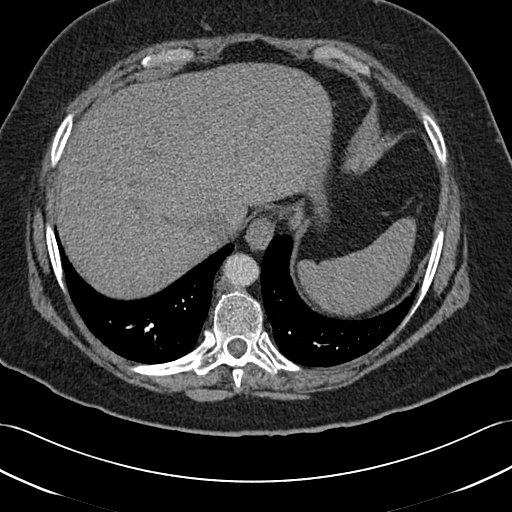
[im 97/229  lung]
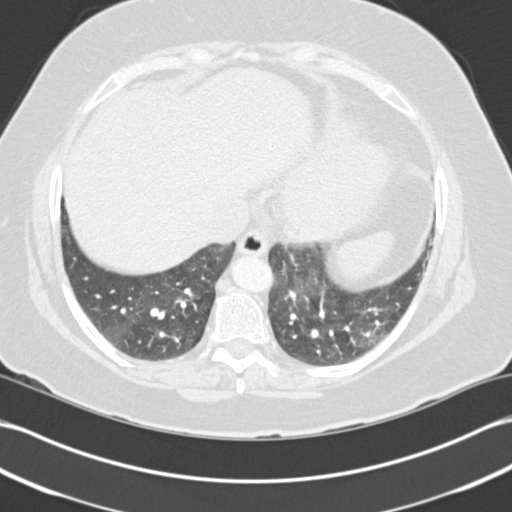
[im 109/229  soft-tissue]
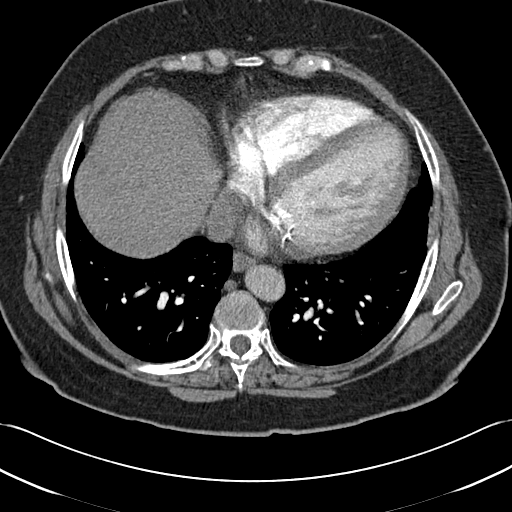
[im 121/229  lung]
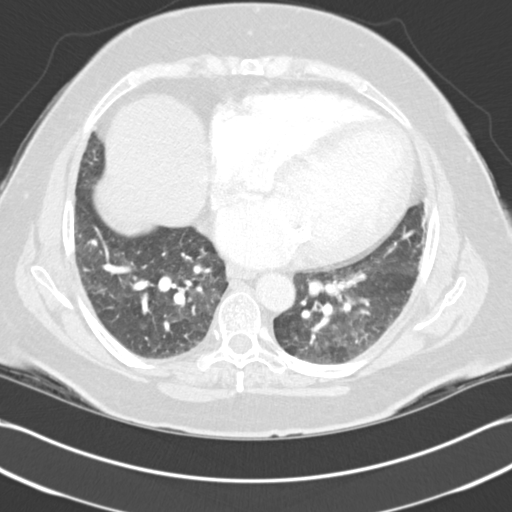
[im 133/229  soft-tissue]
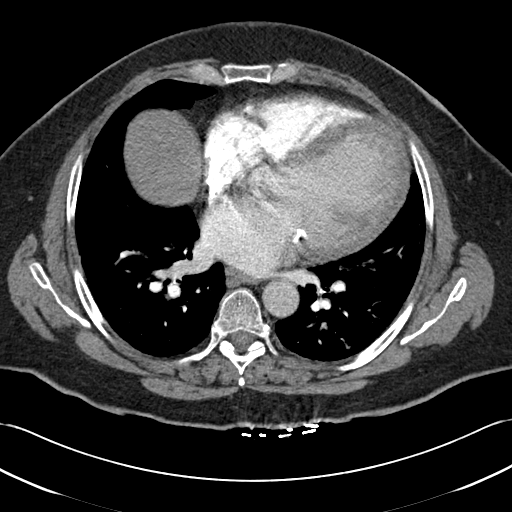
[im 145/229  lung]
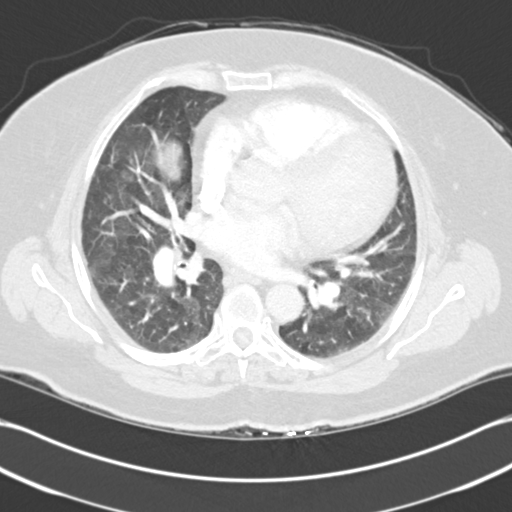
[im 157/229  soft-tissue]
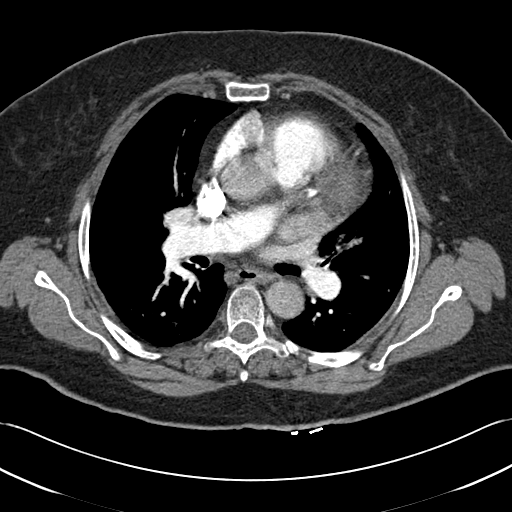
[im 181/229  lung]
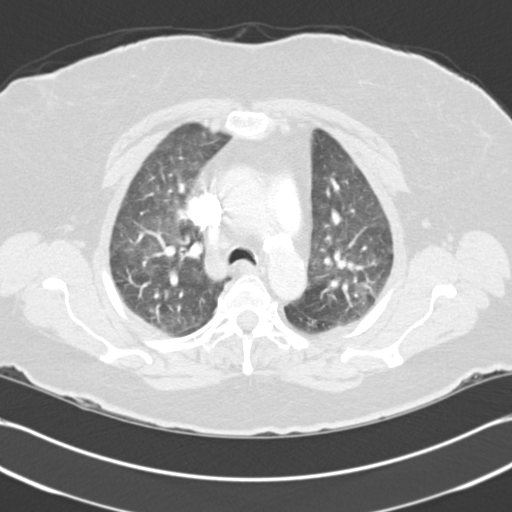
[im 193/229  soft-tissue]
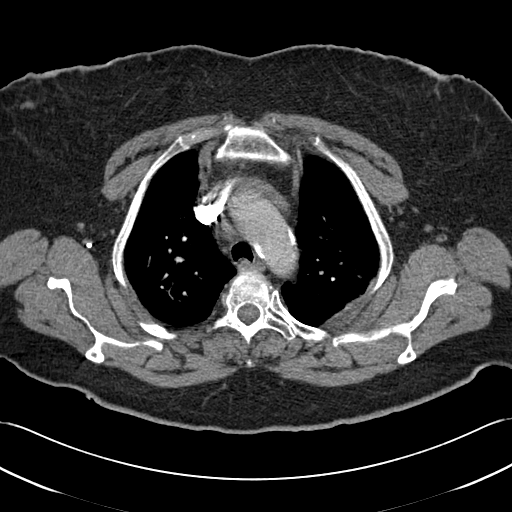
[im 205/229  lung]
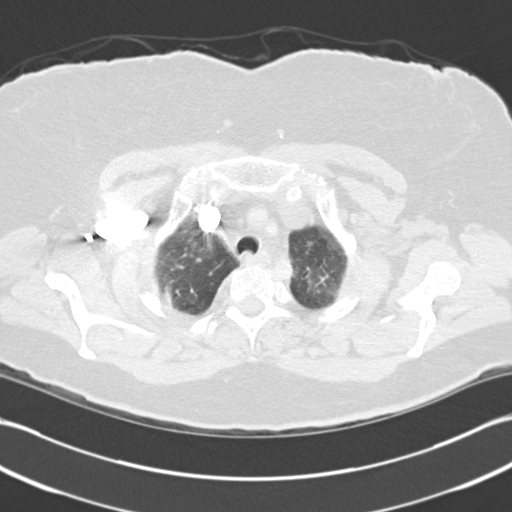
[im 217/229  soft-tissue]
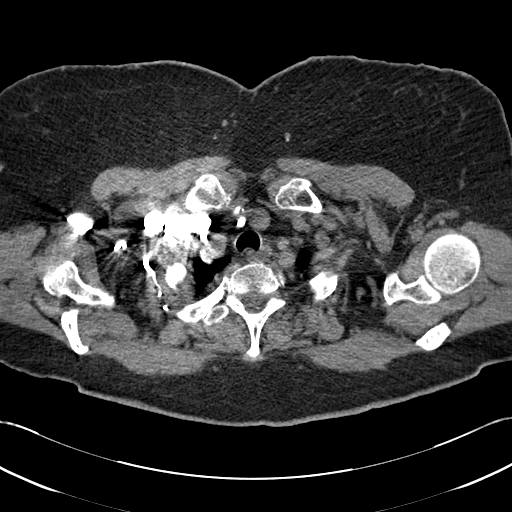

[Series 7: cor mpr 2.0 · coronal · 0.69mm/px · 3 of 132 slices shown]
[im 33/132  soft-tissue]
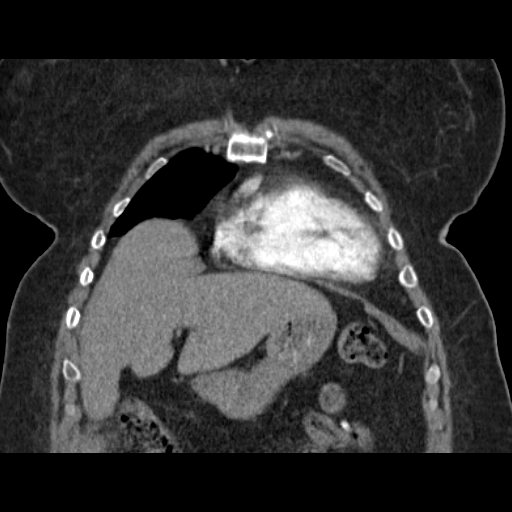
[im 66/132  soft-tissue]
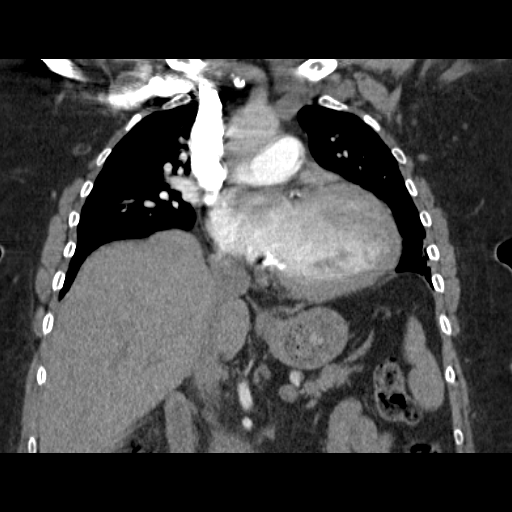
[im 99/132  soft-tissue]
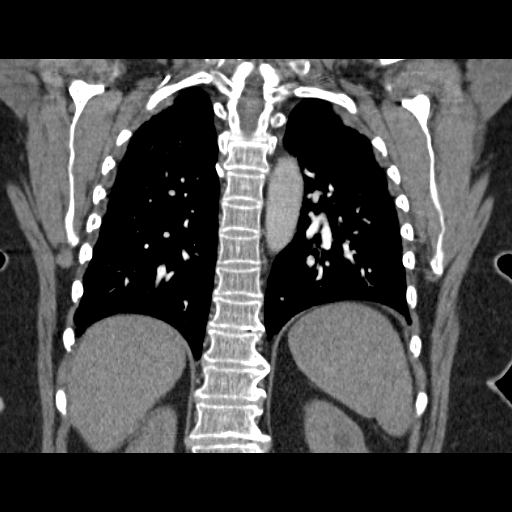

[19 of 46 positions shown; findings below may reference images not displayed]

FINDINGS: The quality of this CTA exam is fair due to motion and poor
opacification of the upper pulmonary artery. There is no evidence
for large or central pulmonary emboli. No evidence for chest
lymphadenopathy. There are mitral annular calcifications. Heart size
is mildly enlarged. No significant pericardial or pleural fluid.
cm hypodense structure in the left kidney which likely represents a
cortical cyst. Otherwise, the images of the upper abdomen are
unremarkable.

The trachea and mainstem bronchi are patent. There are patchy
ground-glass densities in both lungs. No large areas of
consolidation. Small focal nodular densities in the right upper lung
on sequence 6, image 33 and image 19. Nodular densities roughly
measure up to 5 mm in greatest diameter.

No acute bone abnormality. Evidence for disc disease in lower
thoracic spine.

Review of the MIP images confirms the above findings.
IMPRESSION: No evidence for a large or central pulmonary embolism as described.

Patchy ground-glass densities in both lungs are nonspecific.
Findings could be related to mild pulmonary edema and/or
atelectasis. Few small nonspecific nodular densities in the upper
lungs, particularly in the right lung as described. An atypical
infectious etiology cannot be completely excluded. Recommend a 3
month follow-up CT exam for these small irregular nodular densities,
particularly since the patient has history of uterine cancer.

## 2016-10-15 DIAGNOSIS — D519 Vitamin B12 deficiency anemia, unspecified: Secondary | ICD-10-CM | POA: Diagnosis not present

## 2016-10-23 DIAGNOSIS — I10 Essential (primary) hypertension: Secondary | ICD-10-CM | POA: Diagnosis not present

## 2016-10-23 DIAGNOSIS — I498 Other specified cardiac arrhythmias: Secondary | ICD-10-CM | POA: Diagnosis not present

## 2016-10-23 DIAGNOSIS — D519 Vitamin B12 deficiency anemia, unspecified: Secondary | ICD-10-CM | POA: Diagnosis not present

## 2016-10-23 DIAGNOSIS — R079 Chest pain, unspecified: Secondary | ICD-10-CM | POA: Diagnosis not present

## 2016-10-24 DIAGNOSIS — D509 Iron deficiency anemia, unspecified: Secondary | ICD-10-CM | POA: Diagnosis not present

## 2016-10-24 DIAGNOSIS — I499 Cardiac arrhythmia, unspecified: Secondary | ICD-10-CM | POA: Diagnosis not present

## 2016-10-29 DIAGNOSIS — R079 Chest pain, unspecified: Secondary | ICD-10-CM | POA: Diagnosis not present

## 2016-10-29 DIAGNOSIS — R0602 Shortness of breath: Secondary | ICD-10-CM | POA: Diagnosis not present

## 2016-11-08 ENCOUNTER — Other Ambulatory Visit: Payer: Self-pay | Admitting: Nurse Practitioner

## 2016-11-08 DIAGNOSIS — R079 Chest pain, unspecified: Secondary | ICD-10-CM

## 2016-11-15 DIAGNOSIS — D519 Vitamin B12 deficiency anemia, unspecified: Secondary | ICD-10-CM | POA: Diagnosis not present

## 2016-11-16 ENCOUNTER — Ambulatory Visit: Payer: Medicare HMO

## 2016-11-29 DIAGNOSIS — D519 Vitamin B12 deficiency anemia, unspecified: Secondary | ICD-10-CM | POA: Diagnosis not present

## 2016-12-13 DIAGNOSIS — D519 Vitamin B12 deficiency anemia, unspecified: Secondary | ICD-10-CM | POA: Diagnosis not present

## 2016-12-19 DIAGNOSIS — I25119 Atherosclerotic heart disease of native coronary artery with unspecified angina pectoris: Secondary | ICD-10-CM | POA: Diagnosis not present

## 2016-12-19 DIAGNOSIS — R079 Chest pain, unspecified: Secondary | ICD-10-CM | POA: Diagnosis not present

## 2016-12-19 DIAGNOSIS — I2510 Atherosclerotic heart disease of native coronary artery without angina pectoris: Secondary | ICD-10-CM | POA: Diagnosis not present

## 2016-12-19 DIAGNOSIS — R9439 Abnormal result of other cardiovascular function study: Secondary | ICD-10-CM | POA: Diagnosis not present

## 2016-12-19 DIAGNOSIS — I10 Essential (primary) hypertension: Secondary | ICD-10-CM | POA: Diagnosis not present

## 2017-01-03 DIAGNOSIS — N393 Stress incontinence (female) (male): Secondary | ICD-10-CM | POA: Diagnosis not present

## 2017-01-03 DIAGNOSIS — I498 Other specified cardiac arrhythmias: Secondary | ICD-10-CM | POA: Diagnosis not present

## 2017-01-03 DIAGNOSIS — I2583 Coronary atherosclerosis due to lipid rich plaque: Secondary | ICD-10-CM | POA: Diagnosis not present

## 2017-01-03 DIAGNOSIS — I428 Other cardiomyopathies: Secondary | ICD-10-CM | POA: Diagnosis not present

## 2017-01-03 DIAGNOSIS — N390 Urinary tract infection, site not specified: Secondary | ICD-10-CM | POA: Diagnosis not present

## 2017-01-04 DIAGNOSIS — D519 Vitamin B12 deficiency anemia, unspecified: Secondary | ICD-10-CM | POA: Diagnosis not present

## 2017-01-09 DIAGNOSIS — C779 Secondary and unspecified malignant neoplasm of lymph node, unspecified: Secondary | ICD-10-CM | POA: Diagnosis not present

## 2017-01-09 DIAGNOSIS — C541 Malignant neoplasm of endometrium: Secondary | ICD-10-CM | POA: Diagnosis not present

## 2017-01-09 DIAGNOSIS — I517 Cardiomegaly: Secondary | ICD-10-CM | POA: Diagnosis not present

## 2017-01-17 DIAGNOSIS — D689 Coagulation defect, unspecified: Secondary | ICD-10-CM | POA: Diagnosis not present

## 2017-01-17 DIAGNOSIS — I340 Nonrheumatic mitral (valve) insufficiency: Secondary | ICD-10-CM | POA: Diagnosis not present

## 2017-01-17 DIAGNOSIS — R0602 Shortness of breath: Secondary | ICD-10-CM | POA: Diagnosis not present

## 2017-01-17 DIAGNOSIS — I110 Hypertensive heart disease with heart failure: Secondary | ICD-10-CM | POA: Diagnosis not present

## 2017-01-17 DIAGNOSIS — I5022 Chronic systolic (congestive) heart failure: Secondary | ICD-10-CM | POA: Diagnosis not present

## 2017-01-17 DIAGNOSIS — C541 Malignant neoplasm of endometrium: Secondary | ICD-10-CM | POA: Diagnosis not present

## 2017-01-17 DIAGNOSIS — R9439 Abnormal result of other cardiovascular function study: Secondary | ICD-10-CM | POA: Diagnosis not present

## 2017-01-17 DIAGNOSIS — I348 Other nonrheumatic mitral valve disorders: Secondary | ICD-10-CM | POA: Diagnosis not present

## 2017-01-17 DIAGNOSIS — C55 Malignant neoplasm of uterus, part unspecified: Secondary | ICD-10-CM | POA: Diagnosis not present

## 2017-01-17 DIAGNOSIS — R5383 Other fatigue: Secondary | ICD-10-CM | POA: Diagnosis not present

## 2017-01-17 DIAGNOSIS — I2510 Atherosclerotic heart disease of native coronary artery without angina pectoris: Secondary | ICD-10-CM | POA: Diagnosis not present

## 2017-01-18 DIAGNOSIS — C541 Malignant neoplasm of endometrium: Secondary | ICD-10-CM | POA: Diagnosis not present

## 2017-01-18 DIAGNOSIS — T451X5A Adverse effect of antineoplastic and immunosuppressive drugs, initial encounter: Secondary | ICD-10-CM | POA: Diagnosis not present

## 2017-01-18 DIAGNOSIS — G620 Drug-induced polyneuropathy: Secondary | ICD-10-CM | POA: Diagnosis not present

## 2017-01-21 DIAGNOSIS — I519 Heart disease, unspecified: Secondary | ICD-10-CM | POA: Diagnosis not present

## 2017-01-21 DIAGNOSIS — I058 Other rheumatic mitral valve diseases: Secondary | ICD-10-CM | POA: Diagnosis not present

## 2017-01-21 DIAGNOSIS — I517 Cardiomegaly: Secondary | ICD-10-CM | POA: Diagnosis not present

## 2017-01-21 DIAGNOSIS — R0602 Shortness of breath: Secondary | ICD-10-CM | POA: Diagnosis not present

## 2017-01-24 DIAGNOSIS — D519 Vitamin B12 deficiency anemia, unspecified: Secondary | ICD-10-CM | POA: Diagnosis not present

## 2017-02-15 DIAGNOSIS — I498 Other specified cardiac arrhythmias: Secondary | ICD-10-CM | POA: Diagnosis not present

## 2017-02-15 DIAGNOSIS — Z23 Encounter for immunization: Secondary | ICD-10-CM | POA: Diagnosis not present

## 2017-02-15 DIAGNOSIS — D519 Vitamin B12 deficiency anemia, unspecified: Secondary | ICD-10-CM | POA: Diagnosis not present

## 2017-02-15 DIAGNOSIS — G4710 Hypersomnia, unspecified: Secondary | ICD-10-CM | POA: Diagnosis not present

## 2017-02-15 DIAGNOSIS — I10 Essential (primary) hypertension: Secondary | ICD-10-CM | POA: Diagnosis not present

## 2017-02-15 DIAGNOSIS — I2583 Coronary atherosclerosis due to lipid rich plaque: Secondary | ICD-10-CM | POA: Diagnosis not present

## 2017-02-15 DIAGNOSIS — I428 Other cardiomyopathies: Secondary | ICD-10-CM | POA: Diagnosis not present

## 2017-02-28 DIAGNOSIS — Z9221 Personal history of antineoplastic chemotherapy: Secondary | ICD-10-CM | POA: Diagnosis not present

## 2017-02-28 DIAGNOSIS — F329 Major depressive disorder, single episode, unspecified: Secondary | ICD-10-CM | POA: Diagnosis not present

## 2017-02-28 DIAGNOSIS — I110 Hypertensive heart disease with heart failure: Secondary | ICD-10-CM | POA: Diagnosis not present

## 2017-02-28 DIAGNOSIS — I5022 Chronic systolic (congestive) heart failure: Secondary | ICD-10-CM | POA: Diagnosis not present

## 2017-02-28 DIAGNOSIS — Z79899 Other long term (current) drug therapy: Secondary | ICD-10-CM | POA: Diagnosis not present

## 2017-02-28 DIAGNOSIS — F419 Anxiety disorder, unspecified: Secondary | ICD-10-CM | POA: Diagnosis not present

## 2017-02-28 DIAGNOSIS — D689 Coagulation defect, unspecified: Secondary | ICD-10-CM | POA: Diagnosis not present

## 2017-02-28 DIAGNOSIS — C55 Malignant neoplasm of uterus, part unspecified: Secondary | ICD-10-CM | POA: Diagnosis not present

## 2017-02-28 DIAGNOSIS — I2510 Atherosclerotic heart disease of native coronary artery without angina pectoris: Secondary | ICD-10-CM | POA: Diagnosis not present

## 2017-03-26 DIAGNOSIS — I5022 Chronic systolic (congestive) heart failure: Secondary | ICD-10-CM | POA: Diagnosis not present

## 2017-03-26 DIAGNOSIS — C541 Malignant neoplasm of endometrium: Secondary | ICD-10-CM | POA: Diagnosis not present

## 2017-04-13 ENCOUNTER — Ambulatory Visit: Payer: Self-pay

## 2017-04-13 ENCOUNTER — Ambulatory Visit: Payer: Self-pay | Admitting: Internal Medicine

## 2017-04-16 NOTE — Progress Notes (Unsigned)
[facility] [address] [place], [place] [address]  Patient Name: Diana Bernard DOB: 1945/12/19   SLEEP STUDY INTERPRETATION  DATE OF SERVICE:  April 13, 2017   SLEEP STUDY HISTORY: This   patient  is referred to the sleep lab for a baseline Polysomnography. Pertinent history includes a history of diagnosis of excessive daytime somnolence and snoring. Compounding factors include  Elevated BMI excessive fatigue during the daytime  PROCEDURE: This overnight polysomnogram was performed using the Alice 5 acquisition system using the standard diagnostic protocol as outlined by the AASM. This includes 6 channels of EEG, 2 channelscannels of EOG, chin EMG, bilateral anterior tibialis EMG, nasal/oral thermister, PTAF, chest and abdominal wall movements, ECG and pulse oximetry. Apneas and Hypopneas were scored per AASM definition.  SLEEP ARCHITECHTURE: This is a baseline polysomnograph  study. The total recording time was  420 minutes and the patients total sleep time is noted to be  375.5 minutes. Sleep onset latency was  6.0 minutes and is reduced.  Stage R sleep onset latency was  252.5 minutes. Sleep maintenance efficiency was  89.4% and is  normal.  Sleep staging expressed as a percentage of total sleep time demonstrated  12.0% N1,  37.3% N2 and  37.8% N3  sleep. Stage R represents  12.9% of total sleep time. This is  reduced.  There were a total of  6 arousals  for an overall arousal index of  1.1 per hour of sleep. PLMS arousal  not noted. Arousals without respiratory events are  noted. This can contribute to sleep architechture disruption.  RESPIRATORY MONITORING:   Patient exhibits  Significant evidence of sleep disorderd breathing characterized by  no central apneas,  no obstructive apneas and  29 mixed apneas. There were  67 obstructive hypopneas and  10 RERAs. Most of the apneas/hypopneas were of  mixed variety. The total apnea hypopnea index (apneas and hypopneas  per hour of sleep) is  28.5 respiratory events per hour and is  Moderately severe.  Respiratory monitoring demonstrated  severe  snoring through the night. There are a total of  574 snoring episodes representing  33.8% of sleep.   Baseline oxygen saturation during wakefulness was  93% and during NREM sleep averaged  91% through the night. Arterial saturation during REM sleep was  92% through the night. There  is significant  oxygen desaturation with the respiratory events. Arterial oxygen desaturation occurred of at least 4%  is noted with a low saturation of  80%. The study was performed  off oxygen.  CARDIAC MONITORING:   Average heart rate is  64 during sleep with a high of  90 beats per minute. Malignant arrhythmias are not noted.    IMPRESSIONS:  --This overnight polysomnogram demonstrates  Moderate obstructive sleep apnea with an overall AHI  28.5 per hour. --The overall AHI was  no worse  during Stage R. --There were associated  significant arterial oxygen desaturations noted  Down to 80% --There  no significant PLMS noted in this study. --There is  severe snoring noted throughout the study.    RECOMMENDATIONS:  --CPAP titration study is  Indicated in this case. --There is   significant oxygen desaturation. --Nasal decongestants and antihistamines may be of help for increased upper airways resistance when present. --Weight loss through dietary and lifestyle modification is recommended in the presence of obesity. --A search for and treatment of any underlying cardiopulmonary disease is recommended in the presence of oxygen desaturations. --Alternative treatment options  if the patient is not willing to use CPAP include oral   appliances as well as surgical intervention which may help in the appropriate patient. --Clinical correlation is recommended. Please feel free to call the office for any further questions or assistance in the care of this patient.     Allyne Gee, MD  [facility] Pulmonary Critical Care Medicine Sleep medicine

## 2017-04-17 ENCOUNTER — Other Ambulatory Visit: Payer: Self-pay | Admitting: Nurse Practitioner

## 2017-04-17 MED ORDER — LISINOPRIL 10 MG PO TABS: 10.0000 mg | ORAL_TABLET | Freq: Every day | ORAL | 1 refills | Status: DC

## 2017-04-17 MED ORDER — OXYBUTYNIN CHLORIDE ER 5 MG PO TB24: 5.0000 mg | ORAL_TABLET | Freq: Two times a day (BID) | ORAL | 6 refills | Status: DC

## 2017-04-18 ENCOUNTER — Other Ambulatory Visit: Payer: Self-pay | Admitting: Nurse Practitioner

## 2017-04-18 DIAGNOSIS — N390 Urinary tract infection, site not specified: Secondary | ICD-10-CM

## 2017-04-18 MED ORDER — CIPROFLOXACIN HCL 500 MG PO TABS: 500.0000 mg | ORAL_TABLET | Freq: Two times a day (BID) | ORAL | 0 refills | Status: DC

## 2017-04-18 NOTE — Progress Notes (Signed)
Sent in rx for cipro 500mg  bid for 7 days to [address]

## 2017-05-02 DIAGNOSIS — F329 Major depressive disorder, single episode, unspecified: Secondary | ICD-10-CM | POA: Diagnosis not present

## 2017-05-02 DIAGNOSIS — I5022 Chronic systolic (congestive) heart failure: Secondary | ICD-10-CM | POA: Diagnosis not present

## 2017-05-02 DIAGNOSIS — Z86718 Personal history of other venous thrombosis and embolism: Secondary | ICD-10-CM | POA: Diagnosis not present

## 2017-05-02 DIAGNOSIS — I5020 Unspecified systolic (congestive) heart failure: Secondary | ICD-10-CM | POA: Diagnosis not present

## 2017-05-02 DIAGNOSIS — Z9221 Personal history of antineoplastic chemotherapy: Secondary | ICD-10-CM | POA: Diagnosis not present

## 2017-05-02 DIAGNOSIS — Z90710 Acquired absence of both cervix and uterus: Secondary | ICD-10-CM | POA: Diagnosis not present

## 2017-05-02 DIAGNOSIS — Z8542 Personal history of malignant neoplasm of other parts of uterus: Secondary | ICD-10-CM | POA: Diagnosis not present

## 2017-05-02 DIAGNOSIS — I2510 Atherosclerotic heart disease of native coronary artery without angina pectoris: Secondary | ICD-10-CM | POA: Diagnosis not present

## 2017-05-02 DIAGNOSIS — Z923 Personal history of irradiation: Secondary | ICD-10-CM | POA: Diagnosis not present

## 2017-05-02 DIAGNOSIS — I110 Hypertensive heart disease with heart failure: Secondary | ICD-10-CM | POA: Diagnosis not present

## 2017-05-06 ENCOUNTER — Encounter: Payer: Self-pay | Admitting: Internal Medicine

## 2017-05-06 ENCOUNTER — Ambulatory Visit: Payer: Medicare HMO | Admitting: Internal Medicine

## 2017-05-06 VITALS — BP 153/74 | HR 61 | Ht 60.0 in | Wt 234.4 lb

## 2017-05-06 DIAGNOSIS — G4733 Obstructive sleep apnea (adult) (pediatric): Secondary | ICD-10-CM

## 2017-05-06 DIAGNOSIS — R0602 Shortness of breath: Secondary | ICD-10-CM

## 2017-05-06 DIAGNOSIS — C55 Malignant neoplasm of uterus, part unspecified: Secondary | ICD-10-CM | POA: Diagnosis not present

## 2017-05-06 NOTE — Patient Instructions (Signed)

## 2017-05-06 NOTE — Progress Notes (Signed)
[facility] [place], [place] [address]  Pulmonary Sleep Medicine  Office Visit Note  Patient Name: Diana Bernard DOB: 15-Jul-1945 MRN [id number]  Date of Service: 05/06/2017     Complaints/HPI:  Patient is referred for consultation for obstructive sleep apnea-hypopnea syndrome.  Patient states she has been having extreme fatigue and tiredness.  Patient does feel sleepy and tired.  She does snore.  She has had weight gain.  Patient also has history of hypertension which is not well controlled.  Denies having any headaches.  She has not had any syncopal episodes.  She has not fallen asleep at the wheel.  She just has no energy to do most things.  She also admits to having some shortness of breath.  Denies having any chest pain no palpitations are noted.  No atrial fibrillation noted.  She does have a recent history of uterine cancer and she is status post hysterectomy.  Current Medication: Outpatient Encounter Medications as of 05/06/2017  Medication Sig Note  . amLODipine (NORVASC) 10 MG tablet 10 mg. 06/16/2015: Per patient take 1 tablet per day , in am.   . cholecalciferol (VITAMIN D) 1000 units tablet Take 1,000 Units by mouth daily.   [name] docusate sodium (STOOL SOFTENER) 100 MG capsule Take 200 mg by mouth daily.  06/07/2015: Received from: [facility]  . esomeprazole (NEXIUM) 20 MG capsule Take 20 mg by mouth daily at 12 noon.   [name] FLUoxetine (PROZAC) 40 MG capsule Take 40 mg by mouth. 06/22/2015: Reports taking fluoxetine 40 mg daily at night  . furosemide (LASIX) 20 MG tablet Take by mouth as needed.  06/16/2015: Takes 1 tablet in am.  . gabapentin (NEURONTIN) 300 MG capsule 2 tablets at in the morning and 2 tablets in the evening . 06/01/2015: Received from: [facility]  . hydrochlorothiazide (HYDRODIURIL) 25 MG tablet Take 25 mg by mouth daily.   [name] ibuprofen (ADVIL,MOTRIN) 800 MG tablet Take 800 mg by mouth. Reported on 06/22/2015 06/16/2015: Can take up  to 3 tablets per day as needed.   [name] lisinopril (PRINIVIL,ZESTRIL) 10 MG tablet Take 1 tablet (10 mg total) by mouth daily.   . Magnesium 250 MG TABS Take by mouth. 06/22/2015: Reports taking once a day  . oxybutynin (DITROPAN-XL) 5 MG 24 hr tablet Take 1 tablet (5 mg total) by mouth 2 (two) times daily.   . traMADol (ULTRAM) 50 MG tablet Take 50 mg by mouth. Reported on 10/24/2015 06/16/2015: Takes twice a day as needed.   [name] aspirin EC 325 MG tablet Take 325 mg by mouth. 06/16/2015: Takes 1 tablet everyday.   . ciprofloxacin (CIPRO) 500 MG tablet Take 1 tablet (500 mg total) by mouth 2 (two) times daily. (Patient not taking: Reported on 05/06/2017)   . Lidocaine HCl 3 % GEL Reported on 06/22/2015 06/07/2015: Received from: [facility]  . Potassium 99 MG TABS Take by mouth. 06/16/2015: Takes 2 tablets everyday.   No facility-administered encounter medications on file as of 05/06/2017.     Surgical History: Past Surgical History:  Procedure Laterality Date  . ABCESS DRAINAGE  2016   Abdominal abcess due to diverticulitis  . CATARACT EXTRACTION W/PHACO Left 10/26/2015   Procedure: CATARACT EXTRACTION PHACO AND INTRAOCULAR LENS PLACEMENT (IOC) LEFT EYE;  Surgeon: Leandrew Koyanagi, MD;  Location: [facility];  Service: Ophthalmology;  Laterality: Left;  . CATARACT EXTRACTION W/PHACO Right 12/07/2015   Procedure: CATARACT EXTRACTION PHACO AND INTRAOCULAR LENS PLACEMENT (IOC);  Surgeon: Leandrew Koyanagi, MD;  Location: [facility];  Service: Ophthalmology;  Laterality: Right;  . CHOLECYSTECTOMY    . FOOT SURGERY     Per patient, bilateral foot surgery.  [name] PELVIC FRACTURE SURGERY     Per patient for cancer.    Medical History: Past Medical History:  Diagnosis Date  . Arthritis   . Cancer ([place])   . Deep vein thrombosis (DVT) ([place]) 2015  . Depression   . GERD (gastroesophageal reflux disease)   . Hypertension   . Weakness of both legs     Family History: Family History   Problem Relation Age of Onset  . Depression Sister   . Heart disease Sister   . Early death Sister   . Depression Sister   . Depression Sister   . Depression Sister   . Depression Sister     Social History: Social History   Socioeconomic History  . Marital status: Married    Spouse name: Not on file  . Number of children: Not on file  . Years of education: Not on file  . Highest education level: Not on file  Social Needs  . Financial resource strain: Not on file  . Food insecurity - worry: Not on file  . Food insecurity - inability: Not on file  . Transportation needs - medical: Not on file  . Transportation needs - non-medical: Not on file  Occupational History  . Not on file  Tobacco Use  . Smoking status: Never Smoker  . Smokeless tobacco: Never Used  Substance and Sexual Activity  . Alcohol use: No  . Drug use: No  . Sexual activity: Not Currently  Other Topics Concern  . Not on file  Social History Narrative  . Not on file     ROS  General: (-) fever, (-) chills, - night sweats, (+) weakness, +fatigue Skin: (-) rashes, (-) itching,. Eyes: (-) visual changes, (-) redness, (-) itching, (-) double or blurred vision. Nose and Sinuses: (-) nasal stuffiness or itchiness, (-) postnasal drip. Mouth and Throat: (-) sore throat, (-) hoarseness. Neck: (-) swollen glands, (-) enlarged thyroid, (-) neck pain. Respiratory: + cough, (-) bloody sputum, + shortness of breath, (-) wheezing. Cardiovascular: + ankle swelling, (-) chest pain. Lymphatic: (-) lymph node enlargement, (-) lymph node tenderness. Neurologic: (-) numbness, (-) tingling,(-) dizziness. Psychiatric: (-) anxiety,  Vital Signs: Blood pressure (!) 153/74, pulse 61, height 5' (1.524 m), weight 234 lb 6.4 oz (106.3 kg), SpO2 97 %.  Examination: General Appearance: The patient is well-developed, well-nourished, and in no distress. Skin: Gross inspection of skin demonstrates no evidence of  abnormality. Head: Patient's head is normocephalic, no gross deformities. Eyes: no gross deformities noted. ENT: ears appear grossly normal. Nasopharynx appears to be normal. Neck: Supple. No thyromegaly. No LAD. Respiratory: Lungs are clear to auscultation with no adventitious sounds. Cardiovascular: Normal S1 and S2 without murmur or rub. Extremities: No cyanosis. pulses are equal. +edema Neurologic: Alert and oriented. No involuntary movements.  LABS: No results found for this or any previous visit (from the past 2160 hour(s)).  Radiology: No results found.  No results found.  No results found.    Assessment and Plan: Patient Active Problem List   Diagnosis Date Noted  . Abscess of female pelvis 05/07/2014  . Lumbago-sciatica due to displacement of lumbar intervertebral disc 12/28/2013  . Morbid obesity ([place]) 04/20/2013  . BP (high blood pressure) 04/13/2013  . Malignant neoplasm of endometrium ([place]) 03/30/2013  . Breathlessness  on exertion 03/30/2013    1. OSA   She had a sleep study done which shows moderate obstructive sleep apnea-hypopnea syndrome with an apnea-hypopnea index of 28  Based on findings and comorbid conditions she will be scheduled for CPAP titration in labs study. She was also encouraged to work on weight loss  2. Shortness of breath  she has a history of secondhand smoke exposure.  She also does have significant shortness of breath noted. We will schedule her for pulmonary function test  3. Obesity  as noted discussed weight loss and dietary and exercise recommendations  4. Uterine cancer  she will follow with her primary oncologist  General Counseling: I have discussed the findings of the evaluation and examination with Isaly.  I have also discussed any further diagnostic evaluation thatmay be needed or ordered today. Lamae verbalizes understanding of the findings of todays visit. We also reviewed her medications today and discussed drug  interactions and side effects including but not limited excessive drowsiness and altered mental states. We also discussed that there is always a risk not just to her but also people around her. she has been encouraged to call the office with any questions or concerns that should arise related to todays visit.    Time spent:  60 min  I have personally obtained a history, examined the patient, evaluated laboratory and imaging results, formulated the assessment and plan and placed orders.    Allyne Gee, MD [facility] Pulmonary and Critical Care Sleep medicine

## 2017-05-17 ENCOUNTER — Ambulatory Visit: Payer: Medicare HMO | Admitting: Nurse Practitioner

## 2017-05-17 ENCOUNTER — Encounter: Payer: Self-pay | Admitting: Nurse Practitioner

## 2017-05-17 ENCOUNTER — Other Ambulatory Visit: Payer: Self-pay

## 2017-05-17 VITALS — BP 135/57 | HR 56 | Temp 97.0°F | Resp 16 | Ht 60.0 in | Wt 234.0 lb

## 2017-05-17 DIAGNOSIS — R1032 Left lower quadrant pain: Secondary | ICD-10-CM | POA: Diagnosis not present

## 2017-05-17 DIAGNOSIS — M5127 Other intervertebral disc displacement, lumbosacral region: Secondary | ICD-10-CM

## 2017-05-17 DIAGNOSIS — N3000 Acute cystitis without hematuria: Secondary | ICD-10-CM

## 2017-05-17 DIAGNOSIS — I10 Essential (primary) hypertension: Secondary | ICD-10-CM | POA: Diagnosis not present

## 2017-05-17 MED ORDER — FUROSEMIDE 20 MG PO TABS: 20.0000 mg | ORAL_TABLET | ORAL | 0 refills | Status: DC | PRN

## 2017-05-17 MED ORDER — HYDROCODONE-ACETAMINOPHEN 5-325 MG PO TABS: 1.0000 | ORAL_TABLET | Freq: Four times a day (QID) | ORAL | 0 refills | Status: DC | PRN

## 2017-05-17 MED ORDER — SULFAMETHOXAZOLE-TRIMETHOPRIM 800-160 MG PO TABS: 1.0000 | ORAL_TABLET | Freq: Two times a day (BID) | ORAL | 0 refills | Status: DC

## 2017-05-17 NOTE — Progress Notes (Signed)
[facility] [place], [place] [address]  Internal MEDICINE  Office Visit Note  Patient Name: Diana Bernard  [id number]  [id number]  Date of Service: 05/17/2017  Chief Complaint  Patient presents with  . Abdominal Pain    lower abdomen and back    The patient is here for sick visit. She has left lower quadrant abdominal pain and left lowe back. Started about 2 weeks ago. Was put on Cipro and took for 10 days. Symptoms improved, but then came right back. New symptoms started again about 6 days.  She was started on 4 new medications per cardiology. She was told that these medications may affect her kidney functions, but she wants to treat bladder/kidney infection before getting blood work done .   Other  This is a recurrent problem. The current episode started in the past 7 days. The problem occurs constantly. The problem has been gradually improving. Associated symptoms include abdominal pain, arthralgias, a change in bowel habit, fatigue, nausea and urinary symptoms. Pertinent negatives include no congestion, headaches, swollen glands or weakness. Exacerbated by: lying down and then getting up out of bed.  She has tried relaxation and rest (oral antibiotics ) for the symptoms. The treatment provided no relief.    Pt is here for routine follow up.    Current Medication: Outpatient Encounter Medications as of 05/17/2017  Medication Sig Note  . amLODipine (NORVASC) 10 MG tablet 10 mg. 06/16/2015: Per patient take 1 tablet per day , in am.   . aspirin EC 325 MG tablet Take 325 mg by mouth. 06/16/2015: Takes 1 tablet everyday.   . cholecalciferol (VITAMIN D) 1000 units tablet Take 1,000 Units by mouth daily.   . ciprofloxacin (CIPRO) 500 MG tablet Take 1 tablet (500 mg total) by mouth 2 (two) times daily. (Patient not taking: Reported on 05/06/2017)   . docusate sodium (STOOL SOFTENER) 100 MG capsule Take 200 mg by mouth daily.  06/07/2015: Received from: [facility]  . esomeprazole (NEXIUM) 20 MG capsule Take 20 mg by mouth daily at 12 noon.   [name] FLUoxetine (PROZAC) 40 MG capsule Take 40 mg by mouth. 06/22/2015: Reports taking fluoxetine 40 mg daily at night  . furosemide (LASIX) 20 MG tablet Take by mouth as needed.  06/16/2015: Takes 1 tablet in am.  . gabapentin (NEURONTIN) 300 MG capsule 2 tablets at in the morning and 2 tablets in the evening . 06/01/2015: Received from: [facility]  . hydrochlorothiazide (HYDRODIURIL) 25 MG tablet Take 25 mg by mouth daily.   [name] ibuprofen (ADVIL,MOTRIN) 800 MG tablet Take 800 mg by mouth. Reported on 06/22/2015 06/16/2015: Can take up to 3 tablets per day as needed.   . Lidocaine HCl 3 % GEL Reported on 06/22/2015 06/07/2015: Received from: [facility]  . lisinopril (PRINIVIL,ZESTRIL) 10 MG tablet Take 1 tablet (10 mg total) by mouth daily.   . Magnesium 250 MG TABS Take by mouth. 06/22/2015: Reports taking once a day  . oxybutynin (DITROPAN-XL) 5 MG 24 hr tablet Take 1 tablet (5 mg total) by mouth 2 (two) times daily.   . Potassium 99 MG TABS Take by mouth. 06/16/2015: Takes 2 tablets everyday.  . traMADol (ULTRAM) 50 MG tablet Take 50 mg by mouth. Reported on 10/24/2015 06/16/2015: Takes twice a day as needed.    No facility-administered encounter medications on file as of 05/17/2017.     Surgical History: Past Surgical History:  Procedure Laterality Date  .  ABCESS DRAINAGE  2016   Abdominal abcess due to diverticulitis  . CATARACT EXTRACTION W/PHACO Left 10/26/2015   Procedure: CATARACT EXTRACTION PHACO AND INTRAOCULAR LENS PLACEMENT (IOC) LEFT EYE;  Surgeon: Leandrew Koyanagi, MD;  Location: [facility];  Service: Ophthalmology;  Laterality: Left;  . CATARACT EXTRACTION W/PHACO Right 12/07/2015   Procedure: CATARACT EXTRACTION PHACO AND INTRAOCULAR LENS PLACEMENT (IOC);  Surgeon: Leandrew Koyanagi, MD;  Location: [facility];  Service: Ophthalmology;  Laterality: Right;  . CHOLECYSTECTOMY    .  FOOT SURGERY     Per patient, bilateral foot surgery.  [name] PELVIC FRACTURE SURGERY     Per patient for cancer.    Medical History: Past Medical History:  Diagnosis Date  . Arthritis   . Cancer ([place])   . Deep vein thrombosis (DVT) ([place]) 2015  . Depression   . GERD (gastroesophageal reflux disease)   . Hypertension   . Weakness of both legs     Family History: Family History  Problem Relation Age of Onset  . Depression Sister   . Heart disease Sister   . Early death Sister   . Depression Sister   . Depression Sister   . Depression Sister   . Depression Sister     Social History   Socioeconomic History  . Marital status: Married    Spouse name: Not on file  . Number of children: Not on file  . Years of education: Not on file  . Highest education level: Not on file  Social Needs  . Financial resource strain: Not on file  . Food insecurity - worry: Not on file  . Food insecurity - inability: Not on file  . Transportation needs - medical: Not on file  . Transportation needs - non-medical: Not on file  Occupational History  . Not on file  Tobacco Use  . Smoking status: Never Smoker  . Smokeless tobacco: Never Used  Substance and Sexual Activity  . Alcohol use: No  . Drug use: No  . Sexual activity: Not Currently  Other Topics Concern  . Not on file  Social History Narrative  . Not on file      Review of Systems  Constitutional: Positive for fatigue. Negative for activity change and appetite change.  HENT: Negative for congestion, postnasal drip and rhinorrhea.   Eyes: Negative.   Respiratory:       Shortness of breath and fatigue with exertion.   Cardiovascular: Positive for leg swelling.  Gastrointestinal: Positive for abdominal pain, change in bowel habit, diarrhea and nausea.       Single episode of diarrhea yesterday.   Genitourinary: Positive for dysuria, flank pain, frequency and urgency.  Musculoskeletal: Positive for arthralgias and back pain.    Allergic/Immunologic: Negative.   Neurological: Negative for weakness and headaches.  Hematological: Negative.   Psychiatric/Behavioral: The patient is nervous/anxious.     Today's Vitals   05/17/17 0945  BP: (!) 135/57  Pulse: (!) 56  Resp: 16  Temp: (!) 97 F (36.1 C)  SpO2: 96%  Weight: 234 lb (106.1 kg)  Height: 5' (1.524 m)    Physical Exam  Constitutional: She is oriented to person, place, and time. She appears well-developed and well-nourished. No distress.  HENT:  Head: Normocephalic and atraumatic.  Mouth/Throat: Oropharynx is clear and moist. No oropharyngeal exudate.  Eyes: EOM are normal. Pupils are equal, round, and reactive to light.  Neck: Normal range of motion. Neck supple. No JVD present. No tracheal deviation present. No  thyromegaly present.  Cardiovascular: Normal rate, regular rhythm and normal heart sounds. Exam reveals no gallop and no friction rub.  No murmur heard. Pulmonary/Chest: Effort normal. No respiratory distress. She has no wheezes. She has no rales.  Abdominal: Soft. Bowel sounds are normal. There is tenderness in the left lower quadrant. There is CVA tenderness.    Genitourinary:  Genitourinary Comments: Urine sample positive for moderate WBC  Musculoskeletal: Normal range of motion.       Arms: Lymphadenopathy:    She has no cervical adenopathy.  Neurological: She is alert and oriented to person, place, and time. No cranial nerve deficit.  Skin: Skin is warm and dry. She is not diaphoretic.  Psychiatric: She has a normal mood and affect. Her behavior is normal. Judgment and thought content normal.     Assessment/Plan:  1. Acute cystitis without hematuria U/a positive for moderate WBC.  Send for culture and sensitivity and adjust antibiotics as indicate.d  - sulfamethoxazole-trimethoprim (BACTRIM DS,SEPTRA DS) 800-160 MG tablet; Take 1 tablet by mouth 2 (two) times daily.  Dispense: 20 tablet; Refill: 0  2. Non-surgical left lower  quadrant abdominal pain - HYDROcodone-acetaminophen (NORCO/VICODIN) 5-325 MG tablet; Take 1 tablet by mouth every 6 (six) hours as needed for moderate pain.  Dispense: 30 tablet; Refill: 0  3. Essential hypertension Improved. Continue bp medication as prescribed   4. Lumbago-sciatica due to displacement of lumbar intervertebral disc Use prescribed pain medications as needed and as prescribed   General Counseling: Bernece verbalizes understanding of the findings of todays visit and agrees with plan of treatment. I have discussed any further diagnostic evaluation that may be needed or ordered today. We also reviewed her medications today. she has been encouraged to call the office with any questions or concerns that should arise related to todays visit.  Reviewed risks and possible side effects associated with taking opiates and benzodiazepines. Combination of these could cause dizziness and drowsiness. Advised him not to drive or operate machinery when taking these medications, as he could put his life and the lives of others at risk. He voiced understanding.    This patient was seen by Leretha Pol, FNP- C in Collaboration with Dr Lavera Guise as a part of collaborative care agreement   Time spent: 23 Minutes   Dr Lavera Guise Internal medicine

## 2017-05-17 NOTE — Addendum Note (Signed)
Addended by: Edd Arbour on: 05/17/2017 04:38 PM   Modules accepted: Orders

## 2017-05-17 NOTE — Addendum Note (Signed)
Addended by: Edd Arbour on: 05/17/2017 04:43 PM   Modules accepted: Orders

## 2017-05-20 ENCOUNTER — Other Ambulatory Visit: Payer: Self-pay

## 2017-05-20 MED ORDER — FUROSEMIDE 20 MG PO TABS: 20.0000 mg | ORAL_TABLET | ORAL | 0 refills | Status: DC | PRN

## 2017-05-22 LAB — CULTURE, URINE COMPREHENSIVE

## 2017-05-24 ENCOUNTER — Other Ambulatory Visit: Payer: Self-pay

## 2017-05-24 LAB — POCT URINALYSIS DIPSTICK
Bilirubin, UA: NEGATIVE
Blood, UA: NEGATIVE
GLUCOSE UA: NEGATIVE
KETONES UA: NEGATIVE
Nitrite, UA: NEGATIVE
Protein, UA: NEGATIVE
Spec Grav, UA: 1.02 (ref 1.010–1.025)
Urobilinogen, UA: 0.2 E.U./dL
pH, UA: 6 (ref 5.0–8.0)

## 2017-05-24 MED ORDER — FUROSEMIDE 20 MG PO TABS
20.0000 mg | ORAL_TABLET | Freq: Every day | ORAL | 1 refills | Status: DC
Start: 2017-05-24 — End: 2021-10-02

## 2017-05-27 DIAGNOSIS — I5022 Chronic systolic (congestive) heart failure: Secondary | ICD-10-CM | POA: Diagnosis not present

## 2017-05-30 ENCOUNTER — Other Ambulatory Visit: Payer: Self-pay

## 2017-05-30 MED ORDER — GABAPENTIN 300 MG PO CAPS: ORAL_CAPSULE | ORAL | 1 refills | Status: DC

## 2017-05-31 ENCOUNTER — Other Ambulatory Visit: Payer: Self-pay

## 2017-05-31 DIAGNOSIS — I5022 Chronic systolic (congestive) heart failure: Secondary | ICD-10-CM | POA: Diagnosis not present

## 2017-05-31 MED ORDER — FLUOXETINE HCL 40 MG PO CAPS: 40.0000 mg | ORAL_CAPSULE | Freq: Every day | ORAL | 1 refills | Status: DC

## 2017-06-04 ENCOUNTER — Other Ambulatory Visit: Payer: Self-pay

## 2017-06-05 ENCOUNTER — Ambulatory Visit
Admission: RE | Admit: 2017-06-05 | Discharge: 2017-06-05 | Disposition: A | Discharge status: Home / Self Care | Payer: Medicare HMO | Source: Ambulatory Visit | Attending: Internal Medicine | Admitting: Internal Medicine

## 2017-06-05 ENCOUNTER — Ambulatory Visit (INDEPENDENT_AMBULATORY_CARE_PROVIDER_SITE_OTHER): Payer: Medicare HMO | Admitting: Internal Medicine

## 2017-06-05 ENCOUNTER — Encounter: Payer: Self-pay | Admitting: Internal Medicine

## 2017-06-05 VITALS — BP 131/61 | HR 63 | Temp 97.9°F | Resp 16 | Ht 60.0 in | Wt 234.6 lb

## 2017-06-05 DIAGNOSIS — J45991 Cough variant asthma: Secondary | ICD-10-CM

## 2017-06-05 DIAGNOSIS — R6889 Other general symptoms and signs: Secondary | ICD-10-CM

## 2017-06-05 DIAGNOSIS — R05 Cough: Secondary | ICD-10-CM | POA: Diagnosis not present

## 2017-06-05 LAB — POC INFLUENZA A&B (BINAX/QUICKVUE)
INFLUENZA A, POC: NEGATIVE
INFLUENZA B, POC: NEGATIVE

## 2017-06-05 MED ORDER — IPRATROPIUM-ALBUTEROL 0.5-2.5 (3) MG/3ML IN SOLN: 3.0000 mL | Freq: Once | RESPIRATORY_TRACT | Status: DC

## 2017-06-05 NOTE — Progress Notes (Signed)
[facility] [place], [place] [address]  Internal MEDICINE  Office Visit Note  Patient Name: Diana Bernard  [id number]  [id number]  Date of Service: 06/05/2017  Chief Complaint  Patient presents with  . Bronchitis    possible, had headaches, sore throat, earache, no fever, body pain, thinks she had flu.  started sunday     HPI Pt is here for a sick visit.She has been on bactrim for UTI since last week. She started having cough and wheezing, does have remote h/o asthma. She has been taking mucinex otc Recent sleep study was done , will see Pulmonary in future. Has low EF   Current Medication:  Outpatient Encounter Medications as of 06/05/2017  Medication Sig Note  . amLODipine (NORVASC) 10 MG tablet 10 mg. 06/16/2015: Per patient take 1 tablet per day , in am.   . aspirin EC 325 MG tablet Take 325 mg by mouth. 06/16/2015: Takes 1 tablet everyday.   . cholecalciferol (VITAMIN D) 1000 units tablet Take 1,000 Units by mouth daily.   [name] docusate sodium (STOOL SOFTENER) 100 MG capsule Take 200 mg by mouth daily.  06/07/2015: Received from: [facility]  . esomeprazole (NEXIUM) 20 MG capsule Take 20 mg by mouth daily at 12 noon.   [name] FLUoxetine (PROZAC) 40 MG capsule Take 1 capsule (40 mg total) by mouth daily.   . furosemide (LASIX) 20 MG tablet Take 1 tablet (20 mg total) by mouth daily.   [name] gabapentin (NEURONTIN) 300 MG capsule Take 2 cap twice a daily for neuropathy   . hydrochlorothiazide (HYDRODIURIL) 25 MG tablet Take 25 mg by mouth daily.   [name] HYDROcodone-acetaminophen (NORCO/VICODIN) 5-325 MG tablet Take 1 tablet by mouth every 6 (six) hours as needed for moderate pain.   [name] ibuprofen (ADVIL,MOTRIN) 800 MG tablet Take 800 mg by mouth. Reported on 06/22/2015 06/16/2015: Can take up to 3 tablets per day as needed.   . Lidocaine HCl 3 % GEL Reported on 06/22/2015 06/07/2015: Received from: [facility]  . lisinopril (PRINIVIL,ZESTRIL) 10 MG tablet Take 1  tablet (10 mg total) by mouth daily.   . Magnesium 250 MG TABS Take by mouth. 06/22/2015: Reports taking once a day  . oxybutynin (DITROPAN-XL) 5 MG 24 hr tablet Take 1 tablet (5 mg total) by mouth 2 (two) times daily.   . Potassium 99 MG TABS Take by mouth. 06/16/2015: Takes 2 tablets everyday.  . traMADol (ULTRAM) 50 MG tablet Take 50 mg by mouth. Reported on 10/24/2015 06/16/2015: Takes twice a day as needed.   . [DISCONTINUED] ciprofloxacin (CIPRO) 500 MG tablet Take 1 tablet (500 mg total) by mouth 2 (two) times daily. (Patient not taking: Reported on 05/06/2017)   . [DISCONTINUED] sulfamethoxazole-trimethoprim (BACTRIM DS,SEPTRA DS) 800-160 MG tablet Take 1 tablet by mouth 2 (two) times daily.    No facility-administered encounter medications on file as of 06/05/2017.       Medical History: Past Medical History:  Diagnosis Date  . Arthritis   . Cancer ([place])   . Deep vein thrombosis (DVT) ([place]) 2015  . Depression   . GERD (gastroesophageal reflux disease)   . Hypertension   . Weakness of both legs      Vital Signs: BP 131/61 (BP Location: Left Arm, Patient Position: Sitting)   Pulse 63   Temp 97.9 F (36.6 C) (Oral)   Resp 16   Ht 5' (1.524 m)   Wt 234 lb 9.6 oz (106.4 kg)  SpO2 96%   BMI 45.82 kg/m    Review of Systems  Constitutional: Negative for chills, diaphoresis and fatigue.  HENT: Negative for ear pain, postnasal drip and sinus pressure.   Eyes: Negative for photophobia, discharge, redness, itching and visual disturbance.  Respiratory: Positive for cough, shortness of breath and wheezing.   Cardiovascular: Negative for chest pain, palpitations and leg swelling.  Gastrointestinal: Negative for abdominal pain, constipation, diarrhea, nausea and vomiting.  Genitourinary: Negative for dysuria and flank pain.  Musculoskeletal: Positive for arthralgias and back pain. Negative for gait problem and neck pain.  Skin: Negative for color change.  Allergic/Immunologic:  Negative for environmental allergies and food allergies.  Neurological: Positive for headaches. Negative for dizziness.  Hematological: Does not bruise/bleed easily.  Psychiatric/Behavioral: Negative for agitation, behavioral problems (depression) and hallucinations.    Physical Exam  Constitutional: She is oriented to person, place, and time. She appears well-developed and well-nourished. No distress.  HENT:  Head: Normocephalic and atraumatic.  Mouth/Throat: Oropharynx is clear and moist. No oropharyngeal exudate.  Eyes: EOM are normal. Pupils are equal, round, and reactive to light.  Neck: Normal range of motion. No JVD present. No tracheal deviation present. No thyromegaly present.  Cardiovascular: Normal rate, regular rhythm and normal heart sounds. Exam reveals no gallop and no friction rub.  No murmur heard. Pulmonary/Chest: Effort normal. No respiratory distress. She has wheezes. She has no rales. She exhibits tenderness.  Abdominal: Soft. Bowel sounds are normal.  Lymphadenopathy:    She has no cervical adenopathy.  Neurological: She is alert and oriented to person, place, and time. No cranial nerve deficit.  Skin: Skin is warm and dry. She is not diaphoretic.  Psychiatric: She has a normal mood and affect. Her behavior is normal.   Assessment/Plan: 1. Flu-like symptoms - POC Influenza A&B (Binax test) Negative   2. Cough variant asthma - Pt has remote h/o asthma. Will need proper diagnostics, samples of MDI (Symbicort 160//  2   puffs bid rinse mouth afterwards) is given in the office  - Today add ipratropium-albuterol (DUONEB) 0.5-2.5 (3) MG/3ML nebulizer solution 3 mL x1 - DG Chest 2 View; Future - Pulmonary Function Test; Future  General Counseling: Diana Bernard verbalizes understanding of the findings of todays visit and agrees with plan of treatment. I have discussed any further diagnostic evaluation that may be needed or ordered today. We also reviewed her medications  today. she has been encouraged to call the office with any questions or concerns that should arise related to todays visit   Orders Placed This Encounter  Procedures  . POC Influenza A&B (Binax test)    Time spent:30 Minutes

## 2017-06-10 ENCOUNTER — Encounter: Payer: Self-pay | Admitting: Internal Medicine

## 2017-06-10 ENCOUNTER — Other Ambulatory Visit: Payer: Self-pay

## 2017-06-10 ENCOUNTER — Encounter: Payer: Medicare HMO | Admitting: Internal Medicine

## 2017-06-10 VITALS — BP 126/56 | HR 61 | Ht 60.0 in | Wt 234.6 lb

## 2017-06-10 DIAGNOSIS — J440 Chronic obstructive pulmonary disease with acute lower respiratory infection: Secondary | ICD-10-CM | POA: Diagnosis not present

## 2017-06-10 DIAGNOSIS — J209 Acute bronchitis, unspecified: Secondary | ICD-10-CM

## 2017-06-10 DIAGNOSIS — G4733 Obstructive sleep apnea (adult) (pediatric): Secondary | ICD-10-CM | POA: Diagnosis not present

## 2017-06-10 MED ORDER — SULFAMETHOXAZOLE-TRIMETHOPRIM 800-160 MG PO TABS: 1.0000 | ORAL_TABLET | Freq: Two times a day (BID) | ORAL | 0 refills | Status: DC

## 2017-06-10 NOTE — Addendum Note (Signed)
Addended by: Devona Konig on: 06/10/2017 11:11 AM   Modules accepted: Orders

## 2017-06-10 NOTE — Procedures (Signed)
error 

## 2017-06-10 NOTE — Patient Instructions (Signed)

## 2017-06-10 NOTE — Progress Notes (Signed)
Diana Bernard, [facility], [place] [address]  Pulmonary Sleep Medicine  Office Visit Note  Patient Name: Diana Bernard DOB: 05-Jun-1945 MRN [id number]  Date of Service: 06/10/2017  Complaints/HPI: She has moderate to severe OSA noted. She was supposed to have a sleep study done and this has not yet been done. Patient will need this setup. Her breathing is not doing well. She has had some URI. She was given abx and states that she is not quite back to baseline. She was not given anything on the last visit  ROS  General: (-) fever, (-) chills, (-) night sweats, (-) weakness Skin: (-) rashes, (-) itching,. Eyes: (-) visual changes, (-) redness, (-) itching. Nose and Sinuses: (-) nasal stuffiness or itchiness, (-) postnasal drip, (-) nosebleeds, (-) sinus trouble. Mouth and Throat: (-) sore throat, (-) hoarseness. Neck: (-) swollen glands, (-) enlarged thyroid, (-) neck pain. Respiratory: + cough, (-) bloody sputum, + shortness of breath, - wheezing. Cardiovascular: - ankle swelling, (-) chest pain. Lymphatic: (-) lymph node enlargement. Neurologic: (-) numbness, (-) tingling. Psychiatric: (-) anxiety, (-) depression   Current Medication: Outpatient Encounter Medications as of 06/10/2017  Medication Sig Note  . amLODipine (NORVASC) 10 MG tablet 10 mg. 06/16/2015: Per patient take 1 tablet per day , in am.   . cholecalciferol (VITAMIN D) 1000 units tablet Take 1,000 Units by mouth daily.   [name] docusate sodium (STOOL SOFTENER) 100 MG capsule Take 200 mg by mouth daily.  06/07/2015: Received from: [facility]  . esomeprazole (NEXIUM) 20 MG capsule Take 20 mg by mouth daily at 12 noon.   [name] FLUoxetine (PROZAC) 40 MG capsule Take 1 capsule (40 mg total) by mouth daily.   [name] gabapentin (NEURONTIN) 300 MG capsule Take 2 cap twice a daily for neuropathy   . hydrochlorothiazide (HYDRODIURIL) 25 MG tablet Take 25 mg by mouth daily.   [name] HYDROcodone-acetaminophen  (NORCO/VICODIN) 5-325 MG tablet Take 1 tablet by mouth every 6 (six) hours as needed for moderate pain.   [name] ibuprofen (ADVIL,MOTRIN) 800 MG tablet Take 800 mg by mouth. Reported on 06/22/2015 06/16/2015: Can take up to 3 tablets per day as needed.   [name] lisinopril (PRINIVIL,ZESTRIL) 10 MG tablet Take 1 tablet (10 mg total) by mouth daily.   . Magnesium 250 MG TABS Take by mouth. 06/22/2015: Reports taking once a day  . metoprolol succinate (TOPROL-XL) 25 MG 24 hr tablet Take 12.5 mg by mouth.   . oxybutynin (DITROPAN-XL) 5 MG 24 hr tablet Take 1 tablet (5 mg total) by mouth 2 (two) times daily.   [name] spironolactone (ALDACTONE) 25 MG tablet    . traMADol (ULTRAM) 50 MG tablet Take 50 mg by mouth as needed. Reported on 10/24/2015 06/16/2015: Takes twice a day as needed.   [name] aspirin EC 325 MG tablet Take 325 mg by mouth. 06/16/2015: Takes 1 tablet everyday.   . CYANOCOBALAMIN IJ Inject as directed.   [name] ESOMEPRAZOLE MAGNESIUM PO Take 40 mg by mouth.   . furosemide (LASIX) 20 MG tablet Take 1 tablet (20 mg total) by mouth daily. (Patient not taking: Reported on 06/10/2017)   . Lidocaine HCl 3 % GEL Reported on 06/22/2015 06/07/2015: Received from: [facility]  . MAGNESIUM PO Take by mouth.   . metoprolol succinate (TOPROL-XL) 25 MG 24 hr tablet    . Potassium 99 MG TABS Take by mouth. 06/16/2015: Takes 2 tablets everyday.  . SPS 15 GM/60ML suspension  Facility-Administered Encounter Medications as of 06/10/2017  Medication  . ipratropium-albuterol (DUONEB) 0.5-2.5 (3) MG/3ML nebulizer solution 3 mL    Surgical History: Past Surgical History:  Procedure Laterality Date  . ABCESS DRAINAGE  2016   Abdominal abcess due to diverticulitis  . CATARACT EXTRACTION W/PHACO Left 10/26/2015   Procedure: CATARACT EXTRACTION PHACO AND INTRAOCULAR LENS PLACEMENT (IOC) LEFT EYE;  Surgeon: Leandrew Koyanagi, MD;  Location: [place];  Service: Ophthalmology;  Laterality: Left;  . CATARACT EXTRACTION  W/PHACO Right 12/07/2015   Procedure: CATARACT EXTRACTION PHACO AND INTRAOCULAR LENS PLACEMENT (IOC);  Surgeon: Leandrew Koyanagi, MD;  Location: [place];  Service: Ophthalmology;  Laterality: Right;  . CHOLECYSTECTOMY    . FOOT SURGERY     Per patient, bilateral foot surgery.  [name] PELVIC FRACTURE SURGERY     Per patient for cancer.    Medical History: Past Medical History:  Diagnosis Date  . Arthritis   . Cancer ([place])   . Deep vein thrombosis (DVT) ([place]) 2015  . Depression   . GERD (gastroesophageal reflux disease)   . Hypertension   . Weakness of both legs     Family History: Family History  Problem Relation Age of Onset  . Depression Sister   . Heart disease Sister   . Early death Sister   . Depression Sister   . Depression Sister   . Depression Sister   . Depression Sister     Social History: Social History   Socioeconomic History  . Marital status: Married    Spouse name: Not on file  . Number of children: Not on file  . Years of education: Not on file  . Highest education level: Not on file  Social Needs  . Financial resource strain: Not on file  . Food insecurity - worry: Not on file  . Food insecurity - inability: Not on file  . Transportation needs - medical: Not on file  . Transportation needs - non-medical: Not on file  Occupational History  . Not on file  Tobacco Use  . Smoking status: Never Smoker  . Smokeless tobacco: Never Used  Substance and Sexual Activity  . Alcohol use: No  . Drug use: No  . Sexual activity: Not Currently  Other Topics Concern  . Not on file  Social History Narrative  . Not on file    Vital Signs: Blood pressure (!) 126/56, pulse 61, height 5' (1.524 m), weight 234 lb 9.6 oz (106.4 kg), SpO2 96 %.  Examination: General Appearance: The patient is well-developed, well-nourished, and in no distress. Skin: Gross inspection of skin unremarkable. Head: normocephalic, no gross deformities. Eyes: no gross  deformities noted. ENT: ears appear grossly normal no exudates. Neck: Supple. No thyromegaly. No LAD. Respiratory: few rhonchi are notd. Cardiovascular: Normal S1 and S2 without murmur or rub. Extremities: No cyanosis. pulses are equal. Neurologic: Alert and oriented. No involuntary movements.  LABS: Recent Results (from the past 2160 hour(s))  CULTURE, URINE COMPREHENSIVE     Status: None   Collection Time: 05/17/17 11:43 AM  Result Value Ref Range   Urine Culture, Comprehensive Final report    Organism ID, Bacteria Comment     Comment: Mixed urogenital flora 6,000  Colonies/mL   POCT Urinalysis Dipstick     Status: Abnormal   Collection Time: 05/24/17 12:25 PM  Result Value Ref Range   Color, UA     Clarity, UA     Glucose, UA neg    Bilirubin, UA neg  Ketones, UA neg    Spec Grav, UA 1.020 1.010 - 1.025   Blood, UA neg    pH, UA 6.0 5.0 - 8.0   Protein, UA neg    Urobilinogen, UA 0.2 0.2 or 1.0 E.U./dL   Nitrite, UA neg    Leukocytes, UA Moderate (2+) (A) Negative   Appearance     Odor    POC Influenza A&B (Binax test)     Status: Normal   Collection Time: 06/05/17  9:18 AM  Result Value Ref Range   Influenza A, POC Negative Negative   Influenza B, POC Negative Negative    Radiology: Dg Chest 2 View  Result Date: 06/05/2017 CLINICAL DATA:  Cough and wheezing for several days EXAM: CHEST  2 VIEW COMPARISON:  01/13/2015 FINDINGS: Cardiac shadow is mildly enlarged but stable. Previously seen right chest wall port has been removed in the interval. The lungs are well aerated bilaterally. No focal infiltrate or sizable effusion is seen. Changes of prior vertebral augmentation are noted. IMPRESSION: No acute abnormality noted. Electronically Signed   By: Inez Catalina M.D.   On: 06/05/2017 10:18    No results found.  Dg Chest 2 View  Result Date: 06/05/2017 CLINICAL DATA:  Cough and wheezing for several days EXAM: CHEST  2 VIEW COMPARISON:  01/13/2015 FINDINGS: Cardiac  shadow is mildly enlarged but stable. Previously seen right chest wall port has been removed in the interval. The lungs are well aerated bilaterally. No focal infiltrate or sizable effusion is seen. Changes of prior vertebral augmentation are noted. IMPRESSION: No acute abnormality noted. Electronically Signed   By: Inez Catalina M.D.   On: 06/05/2017 10:18      Assessment and Plan: Patient Active Problem List   Diagnosis Date Noted  . Abscess of female pelvis 05/07/2014  . Acute deep vein thrombosis (DVT) of femoral vein of left lower extremity ([place]) 03/29/2014  . Lumbago-sciatica due to displacement of lumbar intervertebral disc 12/28/2013  . Low back pain due to displacement of intervertebral disc 12/28/2013  . Neuropathy due to chemotherapeutic drug ([place]) 11/02/2013  . Encounter for antineoplastic chemotherapy 06/01/2013  . Morbid obesity ([place]) 04/20/2013  . Hypertension 04/13/2013  . Malignant neoplasm of endometrium ([place]) 03/30/2013  . Breathlessness on exertion 03/30/2013  . CMC arthritis, thumb, degenerative 01/02/2013    1. COPD with exacerbation she will be given a script bactrim now due to nonresolution of her symtpoms 2. OSA she needs to have the sleep study 3. Obesity weight loss needed again discussed with her  General Counseling: I have discussed the findings of the evaluation and examination with Jay[place].  I have also discussed any further diagnostic evaluation thatmay be needed or ordered today. Sydnee verbalizes understanding of the findings of todays visit. We also reviewed her medications today and discussed drug interactions and side effects including but not limited excessive drowsiness and altered mental states. We also discussed that there is always a risk not just to her but also people around her. she has been encouraged to call the office with any questions or concerns that should arise related to todays visit.    Time spent: 44min  I have personally obtained a  history, examined the patient, evaluated laboratory and imaging results, formulated the assessment and plan and placed orders.    Allyne Gee, MD [facility] Pulmonary and Critical Care Sleep medicine

## 2017-06-19 ENCOUNTER — Ambulatory Visit: Payer: Medicare HMO | Admitting: Internal Medicine

## 2017-06-19 DIAGNOSIS — R0602 Shortness of breath: Secondary | ICD-10-CM

## 2017-06-26 ENCOUNTER — Encounter (INDEPENDENT_AMBULATORY_CARE_PROVIDER_SITE_OTHER): Payer: Medicare HMO | Admitting: Internal Medicine

## 2017-06-26 ENCOUNTER — Ambulatory Visit: Payer: Self-pay | Admitting: Internal Medicine

## 2017-06-26 DIAGNOSIS — G4733 Obstructive sleep apnea (adult) (pediatric): Secondary | ICD-10-CM

## 2017-06-28 IMAGING — CR DG SHOULDER 2+V*R*
1 series · 3 of 3 positions shown · non-contrast
Comparison: None.

CLINICAL DATA: Right shoulder pain. Fell off lawnmower. History of
prior fracture 3 years ago.

EXAM:
RIGHT SHOULDER - 2+ VIEW

[Series 1: dg shoulder right · 0.14mm/px · 3 of 3 slices shown]
[im 1/3]
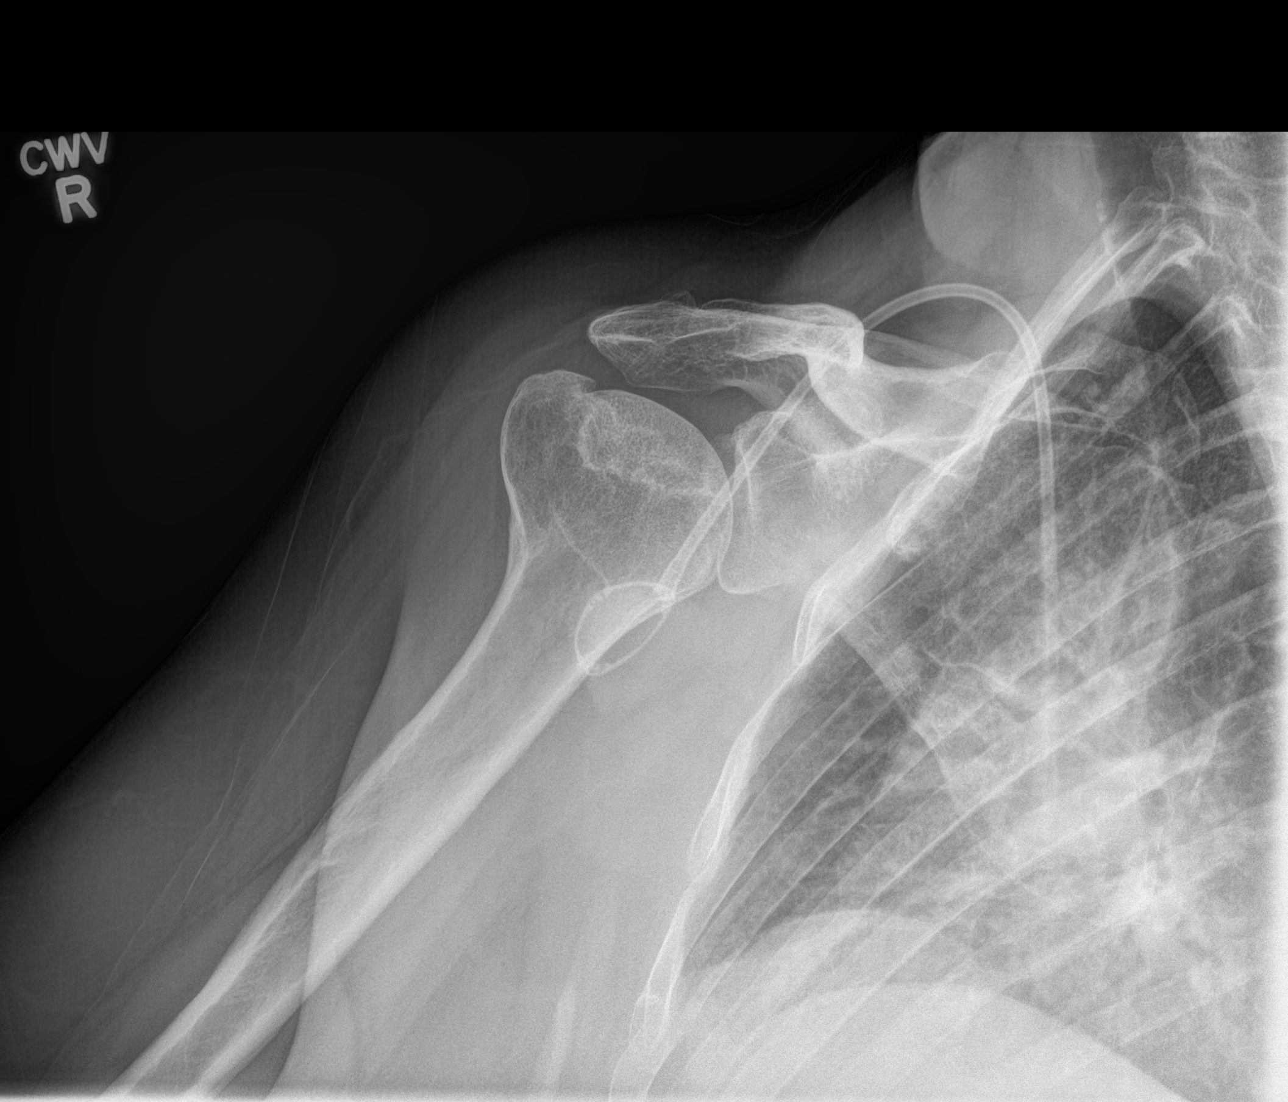
[im 2/3]
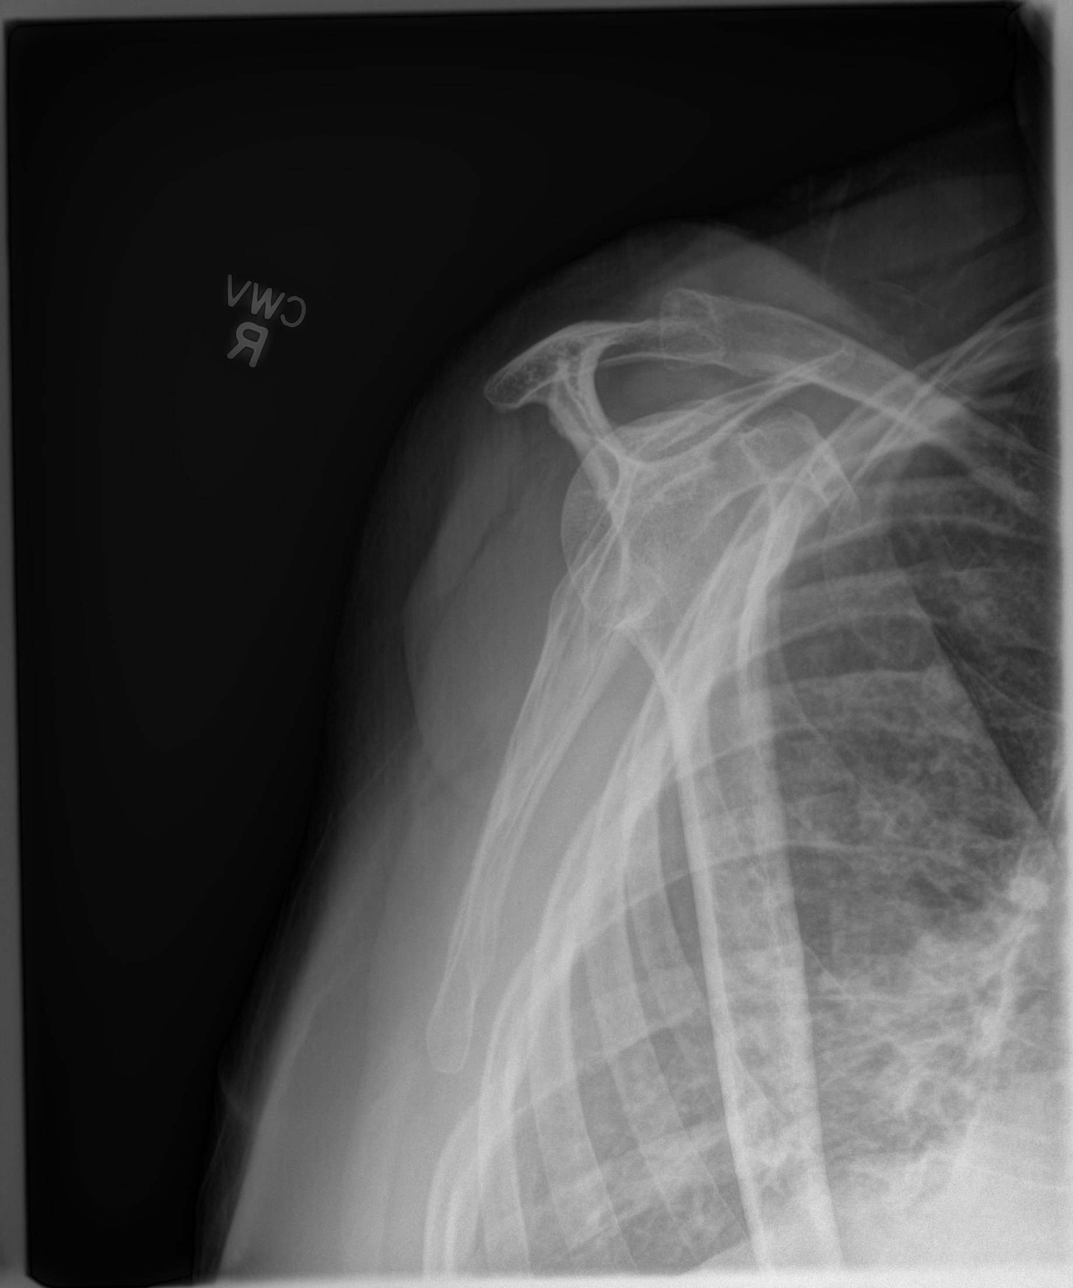
[im 3/3]
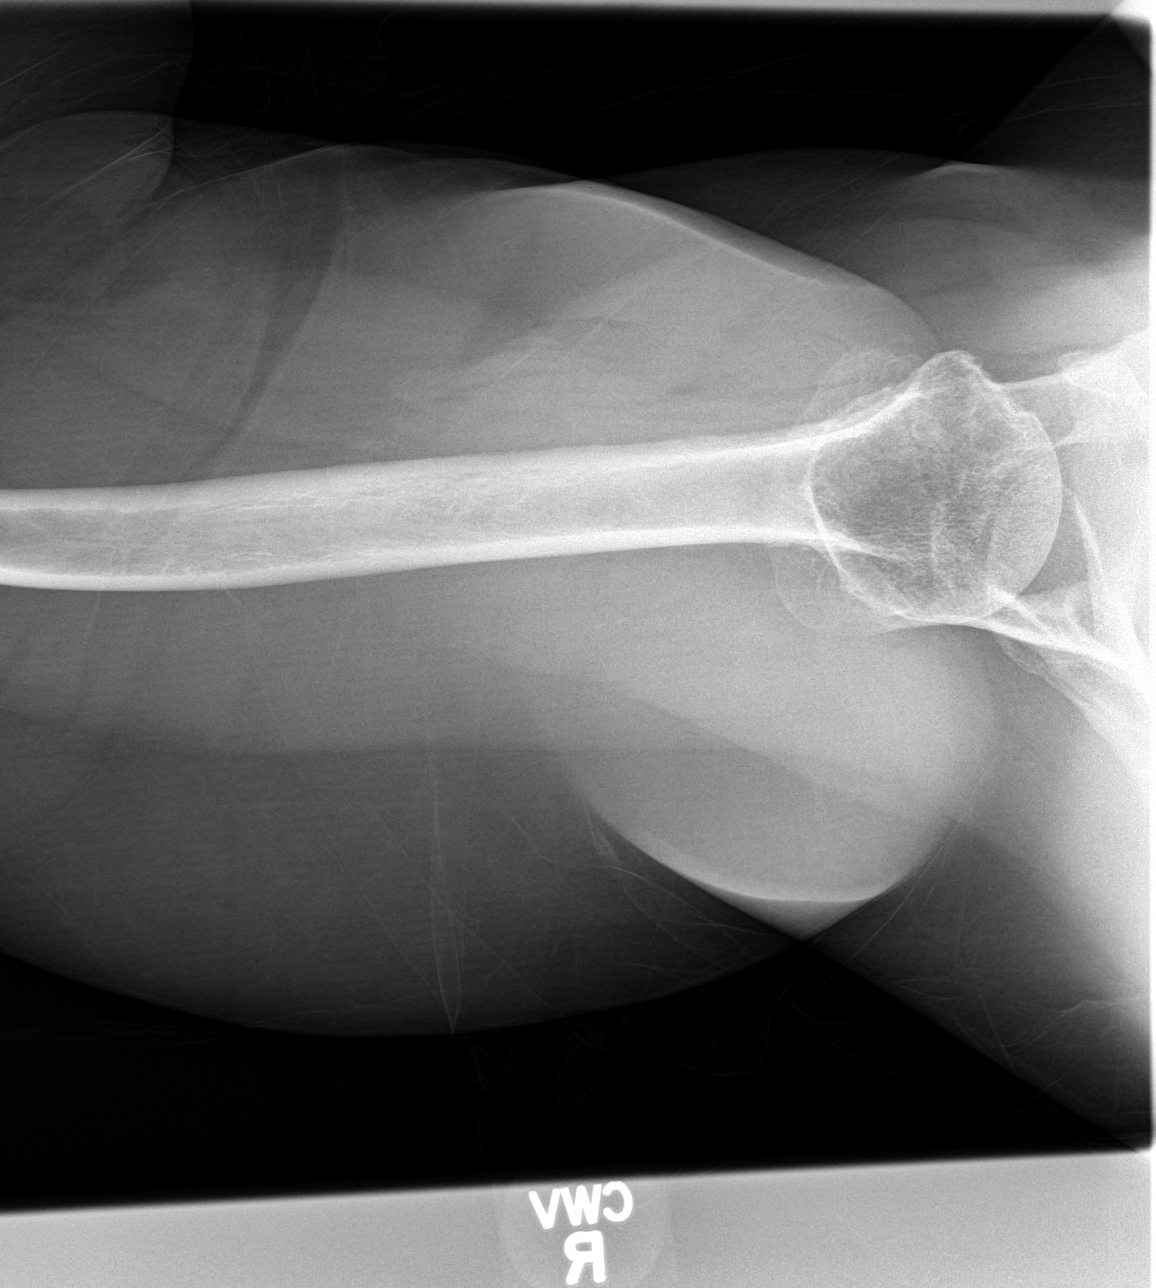

[3 of 3 positions shown; findings below may reference images not displayed]

FINDINGS: There is deformity of the right humeral head consistent with an old
fracture. There is subacromial narrowing. No acute fracture or
subluxation. PowerPort overlies shoulder.
IMPRESSION: 1. Deformity from previous injury.  Subacromial narrowing.
2.  No evidence for acute  abnormality.

## 2017-07-04 ENCOUNTER — Encounter: Payer: Self-pay | Admitting: Internal Medicine

## 2017-07-04 NOTE — Progress Notes (Signed)
[place], [address]  DATE OF SERVICE:  June 19, 2017  Complete Pulmonary Function Testing Interpretation:  FINDINGS:   forced vital capacity is borderline mildly decreased the FEV1 is 1.54 L which is 81% of predicted and within normal limits.  FEV1 FVC ratio is within normal limits.  Post bronchodilator there is significant improvement in the FEV1.  Total lung capacity is within normal limits residual volume is normal residual volume total lung capacity ratio is increased FRC is normal the DLCO is severely reduced  IMPRESSION:   S pulmonary function is within normal limits.   There is clinical improvement in the FEV1 post bronchodilator DLCO severely reduced clinical correlation is recommended  Allyne Gee, MD [facility] Pulmonary Critical Care Medicine Sleep Medicine

## 2017-07-04 NOTE — Patient Instructions (Signed)

## 2017-07-11 ENCOUNTER — Ambulatory Visit: Payer: Medicare HMO | Admitting: Internal Medicine

## 2017-07-11 ENCOUNTER — Encounter: Payer: Self-pay | Admitting: Internal Medicine

## 2017-07-11 ENCOUNTER — Telehealth: Payer: Self-pay | Admitting: Internal Medicine

## 2017-07-11 VITALS — BP 130/64 | HR 59 | Resp 16 | Ht 60.0 in | Wt 242.8 lb

## 2017-07-11 DIAGNOSIS — G4733 Obstructive sleep apnea (adult) (pediatric): Secondary | ICD-10-CM

## 2017-07-11 DIAGNOSIS — J449 Chronic obstructive pulmonary disease, unspecified: Secondary | ICD-10-CM | POA: Diagnosis not present

## 2017-07-11 NOTE — Progress Notes (Signed)
[facility] [place], [place] [address]  Pulmonary Sleep Medicine  Office Visit Note  Patient Name: Diana Bernard DOB: 06-Sep-1945 MRN [id number]  Date of Service: 07/11/2017  Complaints/HPI:  Patient has CPAP titration done.  Shows that she needs a pressure about 10.  Currently she does not have a CPAP machine.  We discussed the setting a CPAP machine.  She will be referred to the DME company and will have this done.  ROS  General: (-) fever, (-) chills, (-) night sweats, (-) weakness Skin: (-) rashes, (-) itching,. Eyes: (-) visual changes, (-) redness, (-) itching. Nose and Sinuses: (-) nasal stuffiness or itchiness, (-) postnasal drip, (-) nosebleeds, (-) sinus trouble. Mouth and Throat: (-) sore throat, (-) hoarseness. Neck: (-) swollen glands, (-) enlarged thyroid, (-) neck pain. Respiratory: - cough, (-) bloody sputum, - shortness of breath, - wheezing. Cardiovascular: - ankle swelling, (-) chest pain. Lymphatic: (-) lymph node enlargement. Neurologic: (-) numbness, (-) tingling. Psychiatric: (-) anxiety, (-) depression   Current Medication: Outpatient Encounter Medications as of 07/11/2017  Medication Sig Note  . amLODipine (NORVASC) 10 MG tablet 10 mg. 06/16/2015: Per patient take 1 tablet per day , in am.   . aspirin EC 325 MG tablet Take 325 mg by mouth. 06/16/2015: Takes 1 tablet everyday.   . cholecalciferol (VITAMIN D) 1000 units tablet Take 1,000 Units by mouth daily.   . CYANOCOBALAMIN IJ Inject as directed.   . docusate sodium (STOOL SOFTENER) 100 MG capsule Take 200 mg by mouth daily.  06/07/2015: Received from: [facility]  . esomeprazole (NEXIUM) 20 MG capsule Take 20 mg by mouth daily at 12 noon.   [name] ESOMEPRAZOLE MAGNESIUM PO Take 40 mg by mouth.   [name] FLUoxetine (PROZAC) 40 MG capsule Take 1 capsule (40 mg total) by mouth daily.   . furosemide (LASIX) 20 MG tablet Take 1 tablet (20 mg total) by mouth daily. (Patient not taking:  Reported on 06/10/2017)   . gabapentin (NEURONTIN) 300 MG capsule Take 2 cap twice a daily for neuropathy   . hydrochlorothiazide (HYDRODIURIL) 25 MG tablet Take 25 mg by mouth daily.   [name] HYDROcodone-acetaminophen (NORCO/VICODIN) 5-325 MG tablet Take 1 tablet by mouth every 6 (six) hours as needed for moderate pain.   [name] ibuprofen (ADVIL,MOTRIN) 800 MG tablet Take 800 mg by mouth. Reported on 06/22/2015 06/16/2015: Can take up to 3 tablets per day as needed.   . Lidocaine HCl 3 % GEL Reported on 06/22/2015 06/07/2015: Received from: [facility]  . lisinopril (PRINIVIL,ZESTRIL) 10 MG tablet Take 1 tablet (10 mg total) by mouth daily.   . Magnesium 250 MG TABS Take by mouth. 06/22/2015: Reports taking once a day  . MAGNESIUM PO Take by mouth.   . metoprolol succinate (TOPROL-XL) 25 MG 24 hr tablet Take 12.5 mg by mouth.   . metoprolol succinate (TOPROL-XL) 25 MG 24 hr tablet    . oxybutynin (DITROPAN-XL) 5 MG 24 hr tablet Take 1 tablet (5 mg total) by mouth 2 (two) times daily.   . Potassium 99 MG TABS Take by mouth. 06/16/2015: Takes 2 tablets everyday.  [name] spironolactone (ALDACTONE) 25 MG tablet    . SPS 15 GM/60ML suspension    . sulfamethoxazole-trimethoprim (BACTRIM DS,SEPTRA DS) 800-160 MG tablet Take 1 tablet by mouth 2 (two) times daily.   . traMADol (ULTRAM) 50 MG tablet Take 50 mg by mouth as needed. Reported on 10/24/2015 06/16/2015: Takes twice a day as  needed.    Facility-Administered Encounter Medications as of 07/11/2017  Medication  . ipratropium-albuterol (DUONEB) 0.5-2.5 (3) MG/3ML nebulizer solution 3 mL    Surgical History: Past Surgical History:  Procedure Laterality Date  . ABCESS DRAINAGE  2016   Abdominal abcess due to diverticulitis  . CATARACT EXTRACTION W/PHACO Left 10/26/2015   Procedure: CATARACT EXTRACTION PHACO AND INTRAOCULAR LENS PLACEMENT (IOC) LEFT EYE;  Surgeon: Leandrew Koyanagi, MD;  Location: [facility];  Service: Ophthalmology;  Laterality: Left;   . CATARACT EXTRACTION W/PHACO Right 12/07/2015   Procedure: CATARACT EXTRACTION PHACO AND INTRAOCULAR LENS PLACEMENT (IOC);  Surgeon: Leandrew Koyanagi, MD;  Location: [facility];  Service: Ophthalmology;  Laterality: Right;  . CHOLECYSTECTOMY    . FOOT SURGERY     Per patient, bilateral foot surgery.  [name] PELVIC FRACTURE SURGERY     Per patient for cancer.    Medical History: Past Medical History:  Diagnosis Date  . Arthritis   . Cancer ([place])   . Deep vein thrombosis (DVT) ([place]) 2015  . Depression   . GERD (gastroesophageal reflux disease)   . Hypertension   . Weakness of both legs     Family History: Family History  Problem Relation Age of Onset  . Depression Sister   . Heart disease Sister   . Early death Sister   . Depression Sister   . Depression Sister   . Depression Sister   . Depression Sister     Social History: Social History   Socioeconomic History  . Marital status: Married    Spouse name: Not on file  . Number of children: Not on file  . Years of education: Not on file  . Highest education level: Not on file  Social Needs  . Financial resource strain: Not on file  . Food insecurity - worry: Not on file  . Food insecurity - inability: Not on file  . Transportation needs - medical: Not on file  . Transportation needs - non-medical: Not on file  Occupational History  . Not on file  Tobacco Use  . Smoking status: Never Smoker  . Smokeless tobacco: Never Used  Substance and Sexual Activity  . Alcohol use: No  . Drug use: No  . Sexual activity: Not Currently  Other Topics Concern  . Not on file  Social History Narrative  . Not on file    Vital Signs: Blood pressure 130/64, pulse (!) 59, resp. rate 16, height 5' (1.524 m), weight 242 lb 12.8 oz (110.1 kg), SpO2 95 %.  Examination: General Appearance: The patient is well-developed, well-nourished, and in no distress. Skin: Gross inspection of skin unremarkable. Head: normocephalic, no  gross deformities. Eyes: no gross deformities noted. ENT: ears appear grossly normal no exudates. Neck: Supple. No thyromegaly. No LAD. Respiratory: clear. Cardiovascular: Normal S1 and S2 without murmur or rub. Extremities: No cyanosis. pulses are equal. Neurologic: Alert and oriented. No involuntary movements.  LABS: Recent Results (from the past 2160 hour(s))  CULTURE, URINE COMPREHENSIVE     Status: None   Collection Time: 05/17/17 11:43 AM  Result Value Ref Range   Urine Culture, Comprehensive Final report    Organism ID, Bacteria Comment     Comment: Mixed urogenital flora 6,000  Colonies/mL   POCT Urinalysis Dipstick     Status: Abnormal   Collection Time: 05/24/17 12:25 PM  Result Value Ref Range   Color, UA     Clarity, UA     Glucose, UA neg  Bilirubin, UA neg    Ketones, UA neg    Spec Grav, UA 1.020 1.010 - 1.025   Blood, UA neg    pH, UA 6.0 5.0 - 8.0   Protein, UA neg    Urobilinogen, UA 0.2 0.2 or 1.0 E.U./dL   Nitrite, UA neg    Leukocytes, UA Moderate (2+) (A) Negative   Appearance     Odor    POC Influenza A&B (Binax test)     Status: Normal   Collection Time: 06/05/17  9:18 AM  Result Value Ref Range   Influenza A, POC Negative Negative   Influenza B, POC Negative Negative    Radiology: Dg Chest 2 View  Result Date: 06/05/2017 CLINICAL DATA:  Cough and wheezing for several days EXAM: CHEST  2 VIEW COMPARISON:  01/13/2015 FINDINGS: Cardiac shadow is mildly enlarged but stable. Previously seen right chest wall port has been removed in the interval. The lungs are well aerated bilaterally. No focal infiltrate or sizable effusion is seen. Changes of prior vertebral augmentation are noted. IMPRESSION: No acute abnormality noted. Electronically Signed   By: Inez Catalina M.D.   On: 06/05/2017 10:18    No results found.  No results found.    Assessment and Plan: Patient Active Problem List   Diagnosis Date Noted  . Abscess of female pelvis  05/07/2014  . Acute deep vein thrombosis (DVT) of femoral vein of left lower extremity ([place]) 03/29/2014  . Lumbago-sciatica due to displacement of lumbar intervertebral disc 12/28/2013  . Low back pain due to displacement of intervertebral disc 12/28/2013  . Neuropathy due to chemotherapeutic drug ([place]) 11/02/2013  . Encounter for antineoplastic chemotherapy 06/01/2013  . Morbid obesity ([place]) 04/20/2013  . Hypertension 04/13/2013  . Malignant neoplasm of endometrium ([place]) 03/30/2013  . Breathlessness on exertion 03/30/2013  . CMC arthritis, thumb, degenerative 01/02/2013    1.   Obstructive sleep apnea-hypopnea syndrome  We will get the CPAP setup done.  She will need to have her mask evaluated and get the pressures set up she needs a maximal pressure of 10 2. COPD she is stable at this time we will continue with present management  General Counseling: I have discussed the findings of the evaluation and examination with Madolyn.  I have also discussed any further diagnostic evaluation thatmay be needed or ordered today. Jamaris verbalizes understanding of the findings of todays visit. We also reviewed her medications today and discussed drug interactions and side effects including but not limited excessive drowsiness and altered mental states. We also discussed that there is always a risk not just to her but also people around her. she has been encouraged to call the office with any questions or concerns that should arise related to todays visit.    Time spent: 78min  I have personally obtained a history, examined the patient, evaluated laboratory and imaging results, formulated the assessment and plan and placed orders.    Allyne Gee, MD [facility] Pulmonary and Critical Care Sleep medicine

## 2017-07-11 NOTE — Patient Instructions (Signed)

## 2017-07-11 NOTE — Telephone Encounter (Signed)
ERROR/CLC

## 2017-07-22 ENCOUNTER — Telehealth: Payer: Self-pay | Admitting: Internal Medicine

## 2017-07-22 NOTE — Telephone Encounter (Signed)
FAXED PAPERWORK FOR CPAP TO [email or web address]

## 2017-07-30 ENCOUNTER — Encounter: Payer: Self-pay | Admitting: Internal Medicine

## 2017-07-30 NOTE — Procedures (Signed)
[facility] [address] [place], [place] [address]  Patient Name: Diana Bernard DOB: 08-Apr-1946   SLEEP STUDY INTERPRETATION  DATE OF SERVICE:   June 26, 2017   SLEEP STUDY HISTORY: This patient is referred to the sleep lab for a baseline Polysomnography. Pertinent history includes a history of diagnosis of excessive daytime somnolence and snoring.  PROCEDURE: This overnight polysomnogram was performed using the Alice 5 acquisition system using the standard diagnostic protocol as outlined by the AASM. This includes 6 channels of EEG, 2 channelscannels of EOG, chin EMG, bilateral anterior tibialis EMG, nasal/oral thermister, PTAF, chest and abdominal wall movements, ECG and pulse oximetry. Apneas and Hypopneas were scored per AASM definition.  SLEEP ARCHITECHTURE: This is a baseline polysomnograph  study. The total recording time was  419 minutes and the patients total sleep time is noted to be  318.5 minutes. Sleep onset latency was  41 minutes and is Prolonged.  Stage R sleep onset latency was  347.5 minutes. Sleep maintenance efficiency was  76% and is  reduce.  Sleep staging expressed as a percentage of total sleep time demonstrated  6.8% N1,  66.2% N2 and  17.4% N3  sleep. Stage R represents  9.6% of total sleep time. This is  reduced.  There were a total of 10 arousals  for an overall arousal index of  1.9 per hour of sleep. PLMS arousal  not noted. Arousals without respiratory events are  noted. This can contribute to sleep architechture disruption.  RESPIRATORY MONITORING:   Patient exhibits  No evidence of sleep disorderd breathing characterized by  0 central apneas,  0 obstructive apneas and  0 mixed apneas. There were  7 obstructive hypopneas and  10 RERAs. Most of the apneas/hypopneas were of  obstructive variety. The total apnea hypopnea index (apneas and hypopneas per hour of sleep) is  1.3 respiratory events per hour and is  Within normal  limits.  Respiratory monitoring demonstrated  moderate snoring through the night. There are a total of  404 snoring episodes representing  24.5% of sleep.   Baseline oxygen saturation during wakefulness was  95% and during NREM sleep averaged  93% through the night. Arterial saturation during REM sleep was  95% through the night. There  is significant  oxygen desaturation with the respiratory events. Arterial oxygen desaturation occurred of at least 4%  is noted with a low saturation of  83%. The study was performed  off oxygen.  CARDIAC MONITORING:   Average heart rate is  63 during sleep with a high of  77 beats per minute. Malignant arrhythmias  Are not noted.  CPAP/BIPAP TITRATION:  patient was started a CPAP pressure of 6 and titrated up to a pressure of 10 as per the lab protocol.  Patient had adequate control on a pressure CPAP 7 cm water pressure with no apneas no hypopneas and only 2 over is with an apnea-hypopnea index of 0.  The lowest saturation noted at this pressure was 92%.  The patient spent a total of 42 min with 100% sleep noted.  On a pressure of 7 there was no REM sleep noted.  When the patient was increased to a pressure of 10 for 46.4 min patient exhibited 100% sleep and at this pressure patient had REM sleep representing 65.7% of the night.  The lowest saturation was 89%.  The apnea-hypopnea index was 0 with no RERAs noted.   IMPRESSIONS:   this CPAP titration study demonstrates adequate control  with an optimal pressure of 10 cm water pressure.   The patient did have slight desaturations noted on this pressure down to 89% so therefore it may be prudent to check an overnight oximetry while on CPAP of 10  Oxygen was not used during this study.  Patient demonstrated adequate sleep during the optimal pressure evaluation   RECOMMENDATIONS:  --CPAP titration study is  Adequate to control the patient's obstructive sleep apnea-hypopnea syndrome adequate pressure appears to be 10  cm water pressure as noted above and overnight oximetry while on the CPAP may be of value to determine the need for nocturnal supplemental oxygen. --Nasal decongestants and antihistamines may be of help for increased upper airways resistance when present. --Weight loss through dietary and lifestyle modification is recommended in the presence of obesity. --A search for and treatment of any underlying cardiopulmonary disease is      recommended in the presence of oxygen desaturations. --Alternative treatment options if the patient is not willing to use CPAP include oral   appliances as well as surgical intervention which may help in the appropriate patient. --Clinical correlation is recommended. Please feel free to call the office for any further  questions or assistance in the care of this patient.     Allyne Gee, MD [facility] Pulmonary Critical Care Medicine Sleep medicine

## 2017-07-30 NOTE — Patient Instructions (Signed)

## 2017-07-31 ENCOUNTER — Ambulatory Visit (INDEPENDENT_AMBULATORY_CARE_PROVIDER_SITE_OTHER): Payer: Medicare HMO

## 2017-07-31 DIAGNOSIS — G4733 Obstructive sleep apnea (adult) (pediatric): Secondary | ICD-10-CM | POA: Diagnosis not present

## 2017-07-31 NOTE — Progress Notes (Signed)
95 percentile pressure 10 cwp   New cpap setup. Patient was setup on a resmed cpap S-10 at 10 cwp with a resmed airfit N-20 nasal mask small. She had a good understanding of using and cleaning cpap. We also discussed cpap compliance of 5 hours a night. She will schedule follow up for cpap clinic and a follow up with Dr. Humphrey Rolls

## 2017-08-08 DIAGNOSIS — F419 Anxiety disorder, unspecified: Secondary | ICD-10-CM | POA: Diagnosis not present

## 2017-08-08 DIAGNOSIS — I447 Left bundle-branch block, unspecified: Secondary | ICD-10-CM | POA: Diagnosis not present

## 2017-08-08 DIAGNOSIS — I517 Cardiomegaly: Secondary | ICD-10-CM | POA: Diagnosis not present

## 2017-08-08 DIAGNOSIS — N390 Urinary tract infection, site not specified: Secondary | ICD-10-CM | POA: Diagnosis not present

## 2017-08-08 DIAGNOSIS — I349 Nonrheumatic mitral valve disorder, unspecified: Secondary | ICD-10-CM | POA: Diagnosis not present

## 2017-08-08 DIAGNOSIS — I110 Hypertensive heart disease with heart failure: Secondary | ICD-10-CM | POA: Diagnosis not present

## 2017-08-08 DIAGNOSIS — I5020 Unspecified systolic (congestive) heart failure: Secondary | ICD-10-CM | POA: Diagnosis not present

## 2017-08-08 DIAGNOSIS — I2510 Atherosclerotic heart disease of native coronary artery without angina pectoris: Secondary | ICD-10-CM | POA: Diagnosis not present

## 2017-08-08 DIAGNOSIS — F329 Major depressive disorder, single episode, unspecified: Secondary | ICD-10-CM | POA: Diagnosis not present

## 2017-08-08 DIAGNOSIS — R002 Palpitations: Secondary | ICD-10-CM | POA: Diagnosis not present

## 2017-08-08 DIAGNOSIS — I5022 Chronic systolic (congestive) heart failure: Secondary | ICD-10-CM | POA: Diagnosis not present

## 2017-08-08 DIAGNOSIS — R0602 Shortness of breath: Secondary | ICD-10-CM | POA: Diagnosis not present

## 2017-08-28 ENCOUNTER — Ambulatory Visit: Payer: Medicare HMO

## 2017-08-29 IMAGING — US US EXTREM LOW VENOUS*L*
1 series · 13 of 24 positions shown · non-contrast
Comparison: None.

CLINICAL DATA: Left lower extremity pain and swelling chronically
for 2 years.



[Series 1: us extrem low venous*left* · 0.08mm/px · 13 of 33 slices shown]
[im 1/33]
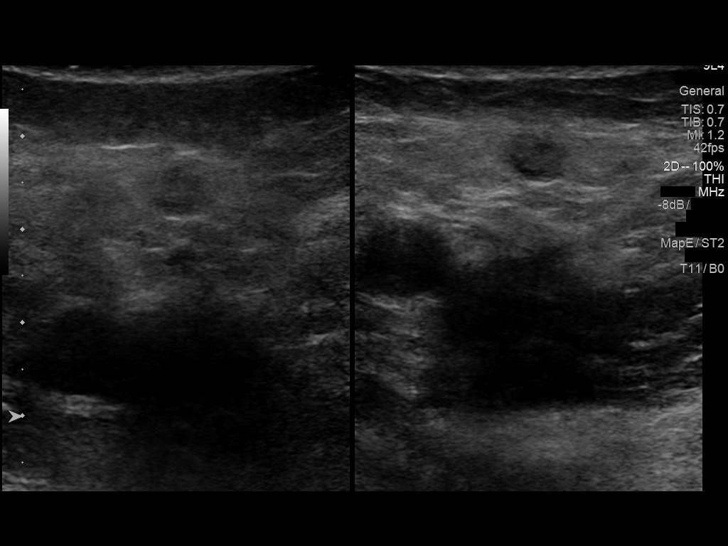
[im 3/33]
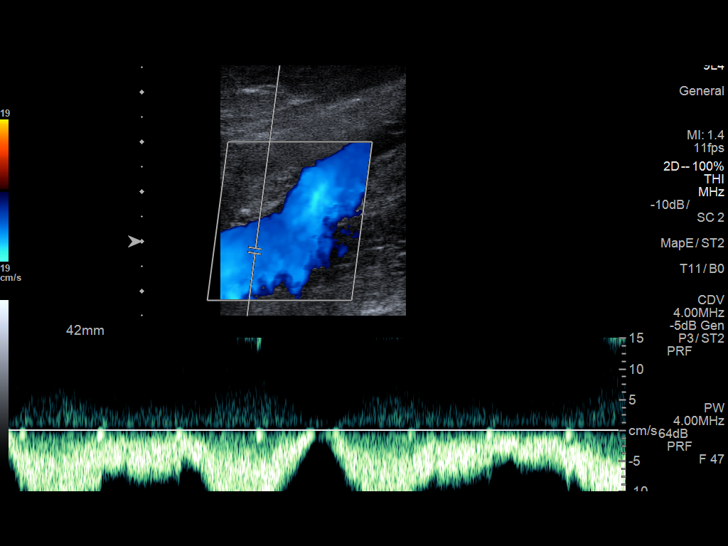
[im 6/33]
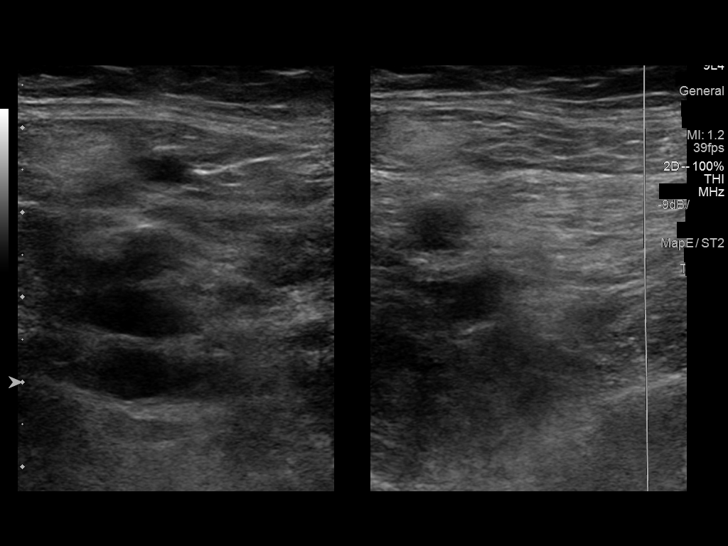
[im 9/33]
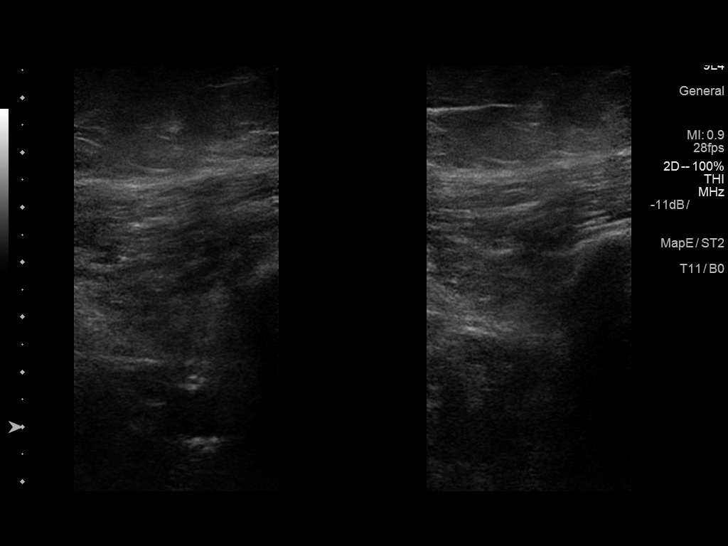
[im 12/33]
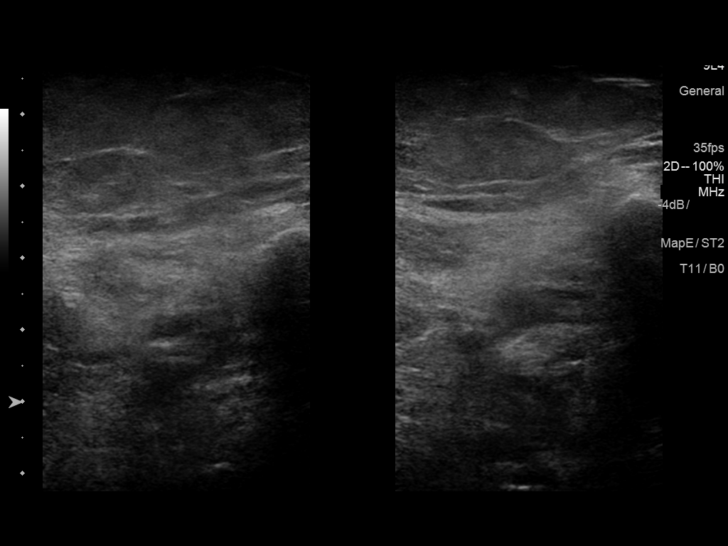
[im 14/33]
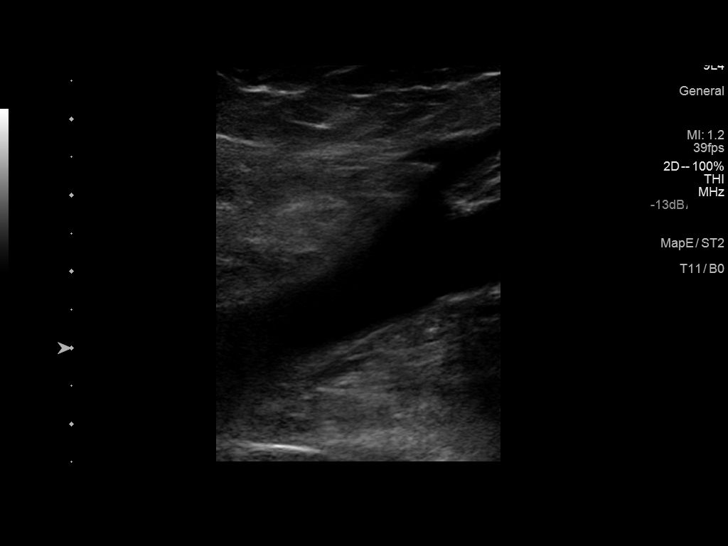
[im 17/33]
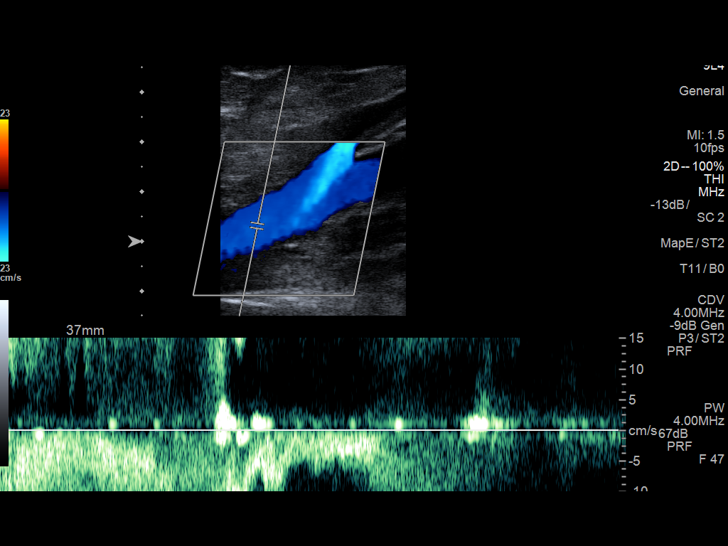
[im 19/33]
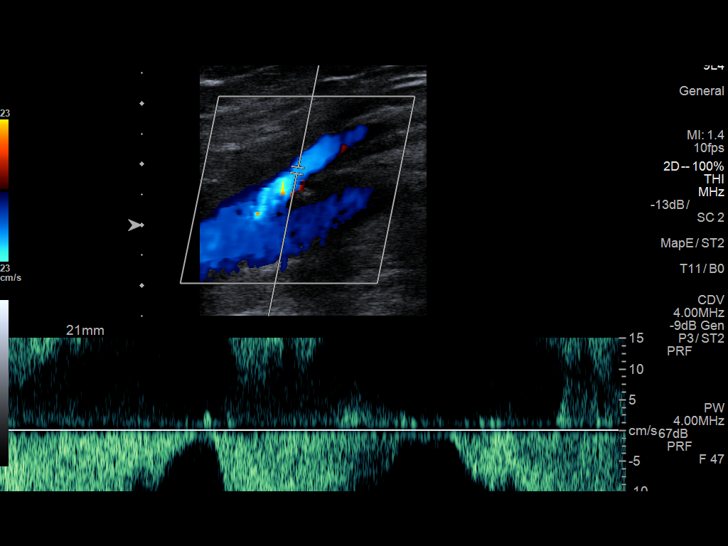
[im 21/33]
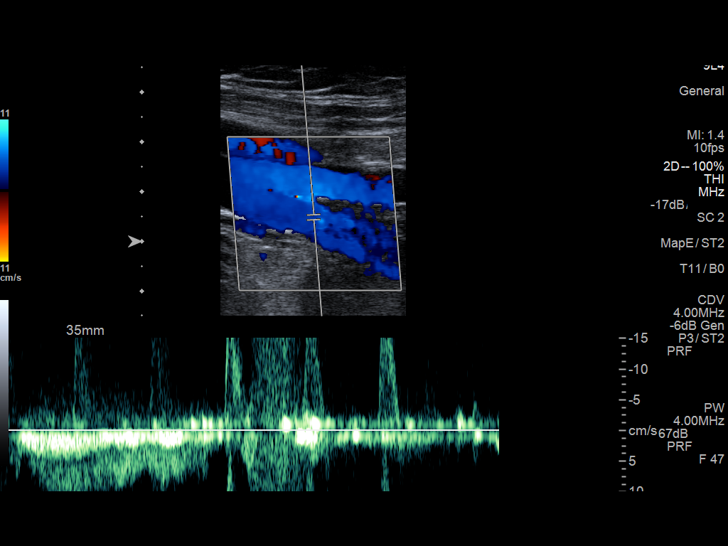
[im 24/33]
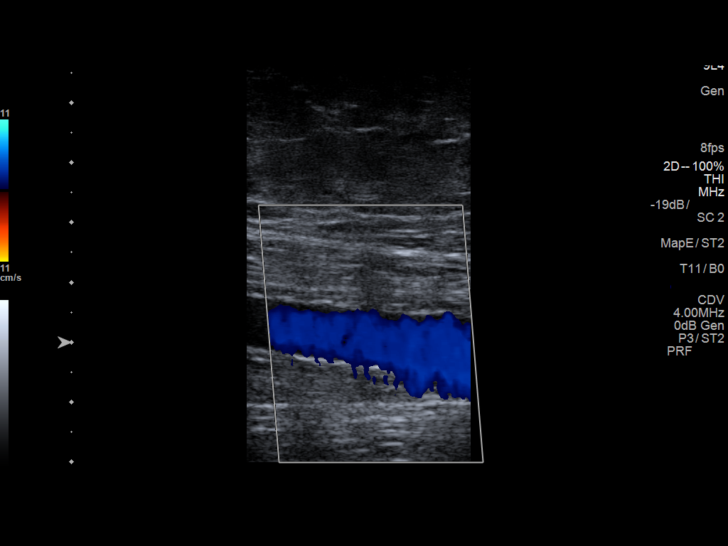
[im 27/33]
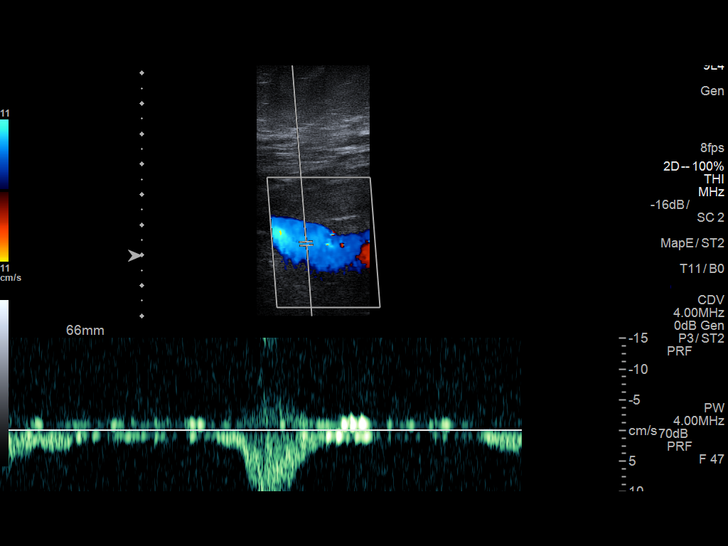
[im 30/33]
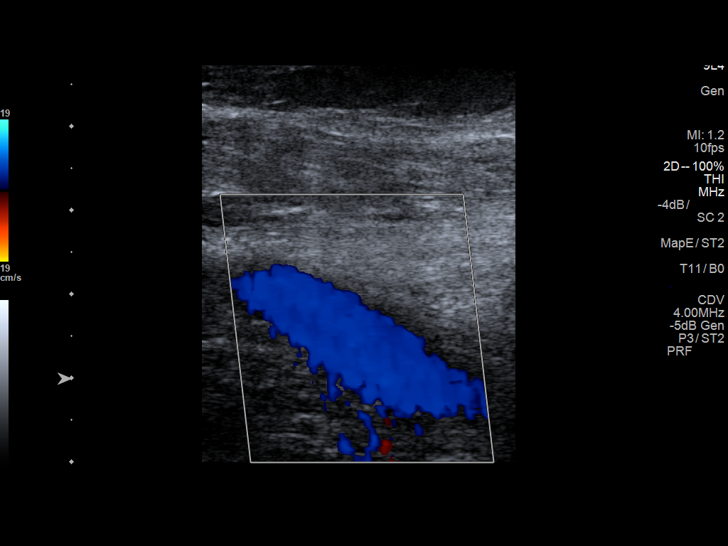
[im 33/33]
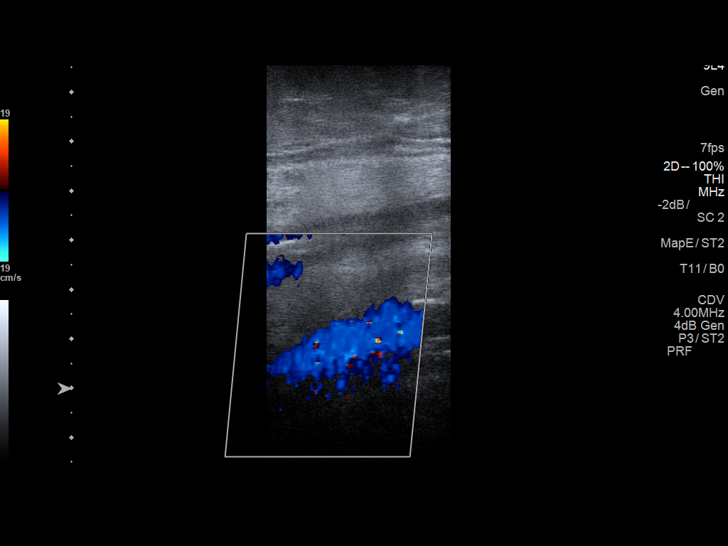

[13 of 24 positions shown; findings below may reference images not displayed]

FINDINGS: Contralateral Common Femoral Vein: Respiratory phasicity is normal
and symmetric with the symptomatic side. No evidence of thrombus.
Normal compressibility.

Common Femoral Vein: No evidence of thrombus. Normal
compressibility, respiratory phasicity and response to augmentation.

Saphenofemoral Junction: No evidence of thrombus. Normal
compressibility and flow on color Doppler imaging.

Profunda Femoral Vein: No evidence of thrombus. Normal
compressibility and flow on color Doppler imaging.

Femoral Vein: No evidence of thrombus. Normal compressibility,
respiratory phasicity and response to augmentation.

Popliteal Vein: No evidence of thrombus. Normal compressibility,
respiratory phasicity and response to augmentation.

Calf Veins: No evidence of thrombus. Normal compressibility and flow
on color Doppler imaging.

Superficial Great Saphenous Vein: No evidence of thrombus. Normal
compressibility and flow on color Doppler imaging.

Venous Reflux:  None.

Other Findings:  None.
IMPRESSION: No evidence of deep venous thrombosis.

## 2017-08-30 DIAGNOSIS — G4733 Obstructive sleep apnea (adult) (pediatric): Secondary | ICD-10-CM | POA: Diagnosis not present

## 2017-09-04 ENCOUNTER — Telehealth: Payer: Self-pay

## 2017-09-04 DIAGNOSIS — I447 Left bundle-branch block, unspecified: Secondary | ICD-10-CM | POA: Insufficient documentation

## 2017-09-04 DIAGNOSIS — Z6841 Body Mass Index (BMI) 40.0 and over, adult: Secondary | ICD-10-CM | POA: Diagnosis not present

## 2017-09-04 DIAGNOSIS — Z9049 Acquired absence of other specified parts of digestive tract: Secondary | ICD-10-CM | POA: Diagnosis not present

## 2017-09-04 DIAGNOSIS — I420 Dilated cardiomyopathy: Secondary | ICD-10-CM | POA: Insufficient documentation

## 2017-09-04 DIAGNOSIS — Z86718 Personal history of other venous thrombosis and embolism: Secondary | ICD-10-CM | POA: Diagnosis not present

## 2017-09-04 DIAGNOSIS — I110 Hypertensive heart disease with heart failure: Secondary | ICD-10-CM | POA: Diagnosis not present

## 2017-09-04 DIAGNOSIS — I5022 Chronic systolic (congestive) heart failure: Secondary | ICD-10-CM | POA: Diagnosis not present

## 2017-09-09 ENCOUNTER — Other Ambulatory Visit: Payer: Self-pay | Admitting: Internal Medicine

## 2017-09-09 MED ORDER — TRAMADOL HCL 50 MG PO TABS: 50.0000 mg | ORAL_TABLET | Freq: Two times a day (BID) | ORAL | 3 refills | Status: DC

## 2017-09-09 NOTE — Telephone Encounter (Signed)
SEND PRES FOR TRAMADOL BY DR Humphrey Rolls

## 2017-09-09 NOTE — Telephone Encounter (Signed)
Pt was notified that tramadol was sent.

## 2017-09-12 ENCOUNTER — Ambulatory Visit: Payer: Medicare HMO | Admitting: Internal Medicine

## 2017-09-12 ENCOUNTER — Encounter: Payer: Self-pay | Admitting: Internal Medicine

## 2017-09-12 VITALS — BP 120/82 | HR 65 | Resp 16 | Ht 60.0 in | Wt 245.0 lb

## 2017-09-12 DIAGNOSIS — G620 Drug-induced polyneuropathy: Secondary | ICD-10-CM

## 2017-09-12 DIAGNOSIS — I5022 Chronic systolic (congestive) heart failure: Secondary | ICD-10-CM | POA: Diagnosis not present

## 2017-09-12 DIAGNOSIS — G4733 Obstructive sleep apnea (adult) (pediatric): Secondary | ICD-10-CM | POA: Diagnosis not present

## 2017-09-12 DIAGNOSIS — T451X5A Adverse effect of antineoplastic and immunosuppressive drugs, initial encounter: Secondary | ICD-10-CM | POA: Diagnosis not present

## 2017-09-12 NOTE — Progress Notes (Signed)
[facility] [place], [place] [address]  Pulmonary Sleep Medicine   Office Visit Note  Patient Name: Diana Bernard DOB: July 21, 1945 MRN [id number]  Date of Service: 09/12/2017  Complaints/HPI:  She is doing well started using her CPAP device.  Patient states that she is gaining benefit from the CPAP as 100% compliance thus far.  She states she feels more energy she is not as tired and sleepy as she was prior.  She did go see her cardiologist in [facility] and was told that she has to have a defibrillator placed I reviewed the notes and I agree with the plan.  Patient was reassured that it appears to be the right thing to do at this time.  I she is trying to work also on some exercise and try to work on losing weight.  She had some issues with her neuropathy but is taking gabapentin for that.  ROS  General: (-) fever, (-) chills, (-) night sweats, (-) weakness Skin: (-) rashes, (-) itching,. Eyes: (-) visual changes, (-) redness, (-) itching. Nose and Sinuses: (-) nasal stuffiness or itchiness, (-) postnasal drip, (-) nosebleeds, (-) sinus trouble. Mouth and Throat: (-) sore throat, (-) hoarseness. Neck: (-) swollen glands, (-) enlarged thyroid, (-) neck pain. Respiratory: - cough, (-) bloody sputum, - shortness of breath, - wheezing. Cardiovascular: - ankle swelling, (-) chest pain. Lymphatic: (-) lymph node enlargement. Neurologic: (-) numbness, (-) tingling. Psychiatric: (-) anxiety, (-) depression   Current Medication: Outpatient Encounter Medications as of 09/12/2017  Medication Sig Note  . amLODipine (NORVASC) 10 MG tablet 10 mg. 06/16/2015: Per patient take 1 tablet per day , in am.   . aspirin EC 325 MG tablet Take 325 mg by mouth. 06/16/2015: Takes 1 tablet everyday.   . cholecalciferol (VITAMIN D) 1000 units tablet Take 1,000 Units by mouth daily.   . CYANOCOBALAMIN IJ Inject as directed.   . docusate sodium (STOOL SOFTENER) 100 MG capsule  Take 200 mg by mouth daily.  06/07/2015: Received from: [facility]  . esomeprazole (NEXIUM) 20 MG capsule Take 20 mg by mouth daily at 12 noon.   [name] ESOMEPRAZOLE MAGNESIUM PO Take 40 mg by mouth.   [name] FLUoxetine (PROZAC) 40 MG capsule Take 1 capsule (40 mg total) by mouth daily.   . furosemide (LASIX) 20 MG tablet Take 1 tablet (20 mg total) by mouth daily. (Patient not taking: Reported on 06/10/2017)   . gabapentin (NEURONTIN) 300 MG capsule Take 2 cap twice a daily for neuropathy   . hydrochlorothiazide (HYDRODIURIL) 25 MG tablet Take 25 mg by mouth daily.   [name] HYDROcodone-acetaminophen (NORCO/VICODIN) 5-325 MG tablet Take 1 tablet by mouth every 6 (six) hours as needed for moderate pain.   [name] ibuprofen (ADVIL,MOTRIN) 800 MG tablet Take 800 mg by mouth. Reported on 06/22/2015 06/16/2015: Can take up to 3 tablets per day as needed.   . Lidocaine HCl 3 % GEL Reported on 06/22/2015 06/07/2015: Received from: [facility]  . lisinopril (PRINIVIL,ZESTRIL) 10 MG tablet Take 1 tablet (10 mg total) by mouth daily.   . Magnesium 250 MG TABS Take by mouth. 06/22/2015: Reports taking once a day  . MAGNESIUM PO Take by mouth.   . metoprolol succinate (TOPROL-XL) 25 MG 24 hr tablet Take 12.5 mg by mouth.   . metoprolol succinate (TOPROL-XL) 25 MG 24 hr tablet    . oxybutynin (DITROPAN-XL) 5 MG 24 hr tablet Take 1 tablet (5 mg total) by  mouth 2 (two) times daily.   . Potassium 99 MG TABS Take by mouth. 06/16/2015: Takes 2 tablets everyday.  [name] spironolactone (ALDACTONE) 25 MG tablet    . SPS 15 GM/60ML suspension    . sulfamethoxazole-trimethoprim (BACTRIM DS,SEPTRA DS) 800-160 MG tablet Take 1 tablet by mouth 2 (two) times daily.   . traMADol (ULTRAM) 50 MG tablet Take 1 tablet (50 mg total) by mouth 2 (two) times daily. One tab po bid prn for pain    Facility-Administered Encounter Medications as of 09/12/2017  Medication  . ipratropium-albuterol (DUONEB) 0.5-2.5 (3) MG/3ML nebulizer solution 3 mL     Surgical History: Past Surgical History:  Procedure Laterality Date  . ABCESS DRAINAGE  2016   Abdominal abcess due to diverticulitis  . CATARACT EXTRACTION W/PHACO Left 10/26/2015   Procedure: CATARACT EXTRACTION PHACO AND INTRAOCULAR LENS PLACEMENT (IOC) LEFT EYE;  Surgeon: Leandrew Koyanagi, MD;  Location: [facility];  Service: Ophthalmology;  Laterality: Left;  . CATARACT EXTRACTION W/PHACO Right 12/07/2015   Procedure: CATARACT EXTRACTION PHACO AND INTRAOCULAR LENS PLACEMENT (IOC);  Surgeon: Leandrew Koyanagi, MD;  Location: [facility];  Service: Ophthalmology;  Laterality: Right;  . CHOLECYSTECTOMY    . FOOT SURGERY     Per patient, bilateral foot surgery.  [name] PELVIC FRACTURE SURGERY     Per patient for cancer.    Medical History: Past Medical History:  Diagnosis Date  . Arthritis   . Cancer ([place])   . Deep vein thrombosis (DVT) ([place]) 2015  . Depression   . GERD (gastroesophageal reflux disease)   . Hypertension   . Weakness of both legs     Family History: Family History  Problem Relation Age of Onset  . Depression Sister   . Heart disease Sister   . Early death Sister   . Depression Sister   . Depression Sister   . Depression Sister   . Depression Sister     Social History: Social History   Socioeconomic History  . Marital status: Married    Spouse name: Not on file  . Number of children: Not on file  . Years of education: Not on file  . Highest education level: Not on file  Occupational History  . Not on file  Social Needs  . Financial resource strain: Not on file  . Food insecurity:    Worry: Not on file    Inability: Not on file  . Transportation needs:    Medical: Not on file    Non-medical: Not on file  Tobacco Use  . Smoking status: Never Smoker  . Smokeless tobacco: Never Used  Substance and Sexual Activity  . Alcohol use: No  . Drug use: No  . Sexual activity: Not Currently  Lifestyle  . Physical activity:     Days per week: Not on file    Minutes per session: Not on file  . Stress: Not on file  Relationships  . Social connections:    Talks on phone: Not on file    Gets together: Not on file    Attends religious service: Not on file    Active member of club or organization: Not on file    Attends meetings of clubs or organizations: Not on file    Relationship status: Not on file  . Intimate partner violence:    Fear of current or ex partner: Not on file    Emotionally abused: Not on file    Physically abused: Not on file  Forced sexual activity: Not on file  Other Topics Concern  . Not on file  Social History Narrative  . Not on file    Vital Signs: Blood pressure 120/82, pulse 65, resp. rate 16, height 5' (1.524 m), weight 245 lb (111.1 kg), SpO2 97 %.  Examination: General Appearance: The patient is well-developed, well-nourished, and in no distress. Skin: Gross inspection of skin unremarkable. Head: normocephalic, no gross deformities. Eyes: no gross deformities noted. ENT: ears appear grossly normal no exudates. Neck: Supple. No thyromegaly. No LAD. Respiratory: no rhonchi noted. Cardiovascular: Normal S1 and S2 without murmur or rub. Extremities: No cyanosis. pulses are equal. Neurologic: Alert and oriented. No involuntary movements.  LABS: No results found for this or any previous visit (from the past 2160 hour(s)).  Radiology: Dg Chest 2 View  Result Date: 06/05/2017 CLINICAL DATA:  Cough and wheezing for several days EXAM: CHEST  2 VIEW COMPARISON:  01/13/2015 FINDINGS: Cardiac shadow is mildly enlarged but stable. Previously seen right chest wall port has been removed in the interval. The lungs are well aerated bilaterally. No focal infiltrate or sizable effusion is seen. Changes of prior vertebral augmentation are noted. IMPRESSION: No acute abnormality noted. Electronically Signed   By: Inez Catalina M.D.   On: 06/05/2017 10:18    No results found.  No results  found.    Assessment and Plan: Patient Active Problem List   Diagnosis Date Noted  . Obstructive sleep apnea (adult) (pediatric) 07/31/2017  . Abscess of female pelvis 05/07/2014  . Acute deep vein thrombosis (DVT) of femoral vein of left lower extremity ([place]) 03/29/2014  . Lumbago-sciatica due to displacement of lumbar intervertebral disc 12/28/2013  . Low back pain due to displacement of intervertebral disc 12/28/2013  . Neuropathy due to chemotherapeutic drug ([place]) 11/02/2013  . Encounter for antineoplastic chemotherapy 06/01/2013  . Morbid obesity ([place]) 04/20/2013  . Hypertension 04/13/2013  . Malignant neoplasm of endometrium ([place]) 03/30/2013  . Breathlessness on exertion 03/30/2013  . CMC arthritis, thumb, degenerative 01/02/2013    1. OSA  We will continue with CPAP at the current pressures.  We will get a formal download done to assess her compliance so far clinically she is doing much better than she was prior to being on CPAP so therefore we will continue with pressure 2. Morbid Obesity she will work on trying to lose we continue with supportive care 3. Chronic systolic heart failure at this time is stable she will get follow-up with her primary cardiologist on she will have a defibrillator device placed as per plan 4. Neuropathy stable now patient is on gabapentin she will continue  General Counseling: I have discussed the findings of the evaluation and examination with Mariachristina.  I have also discussed any further diagnostic evaluation thatmay be needed or ordered today. Nuriya verbalizes understanding of the findings of todays visit. We also reviewed her medications today and discussed drug interactions and side effects including but not limited excessive drowsiness and altered mental states. We also discussed that there is always a risk not just to her but also people around her. she has been encouraged to call the office with any questions or concerns that should arise related  to todays visit.    Time spent: 73min  I have personally obtained a history, examined the patient, evaluated laboratory and imaging results, formulated the assessment and plan and placed orders.    Allyne Gee, MD [facility] Pulmonary and Critical Care Sleep medicine

## 2017-09-12 NOTE — Patient Instructions (Signed)

## 2017-09-26 DIAGNOSIS — Z86718 Personal history of other venous thrombosis and embolism: Secondary | ICD-10-CM | POA: Diagnosis not present

## 2017-09-26 DIAGNOSIS — Z8542 Personal history of malignant neoplasm of other parts of uterus: Secondary | ICD-10-CM | POA: Diagnosis not present

## 2017-09-26 DIAGNOSIS — Z79891 Long term (current) use of opiate analgesic: Secondary | ICD-10-CM | POA: Diagnosis not present

## 2017-09-26 DIAGNOSIS — I447 Left bundle-branch block, unspecified: Secondary | ICD-10-CM | POA: Diagnosis not present

## 2017-09-26 DIAGNOSIS — I420 Dilated cardiomyopathy: Secondary | ICD-10-CM | POA: Diagnosis not present

## 2017-09-26 DIAGNOSIS — I517 Cardiomegaly: Secondary | ICD-10-CM | POA: Diagnosis not present

## 2017-09-26 DIAGNOSIS — R0989 Other specified symptoms and signs involving the circulatory and respiratory systems: Secondary | ICD-10-CM | POA: Diagnosis not present

## 2017-09-26 DIAGNOSIS — Z95810 Presence of automatic (implantable) cardiac defibrillator: Secondary | ICD-10-CM | POA: Diagnosis not present

## 2017-09-26 DIAGNOSIS — I110 Hypertensive heart disease with heart failure: Secondary | ICD-10-CM | POA: Diagnosis not present

## 2017-09-26 DIAGNOSIS — I5022 Chronic systolic (congestive) heart failure: Secondary | ICD-10-CM | POA: Diagnosis not present

## 2017-09-26 DIAGNOSIS — Z79899 Other long term (current) drug therapy: Secondary | ICD-10-CM | POA: Diagnosis not present

## 2017-09-30 DIAGNOSIS — G4733 Obstructive sleep apnea (adult) (pediatric): Secondary | ICD-10-CM | POA: Diagnosis not present

## 2017-10-29 DIAGNOSIS — Z4502 Encounter for adjustment and management of automatic implantable cardiac defibrillator: Secondary | ICD-10-CM | POA: Diagnosis not present

## 2017-10-30 DIAGNOSIS — G4733 Obstructive sleep apnea (adult) (pediatric): Secondary | ICD-10-CM | POA: Diagnosis not present

## 2017-11-14 ENCOUNTER — Ambulatory Visit: Payer: Self-pay | Admitting: Nurse Practitioner

## 2017-11-14 DIAGNOSIS — F419 Anxiety disorder, unspecified: Secondary | ICD-10-CM | POA: Diagnosis not present

## 2017-11-14 DIAGNOSIS — Z95810 Presence of automatic (implantable) cardiac defibrillator: Secondary | ICD-10-CM | POA: Insufficient documentation

## 2017-11-14 DIAGNOSIS — I5022 Chronic systolic (congestive) heart failure: Secondary | ICD-10-CM | POA: Diagnosis not present

## 2017-11-14 DIAGNOSIS — I5020 Unspecified systolic (congestive) heart failure: Secondary | ICD-10-CM | POA: Diagnosis not present

## 2017-11-14 DIAGNOSIS — I10 Essential (primary) hypertension: Secondary | ICD-10-CM | POA: Diagnosis not present

## 2017-11-14 DIAGNOSIS — I110 Hypertensive heart disease with heart failure: Secondary | ICD-10-CM | POA: Diagnosis not present

## 2017-11-14 DIAGNOSIS — F329 Major depressive disorder, single episode, unspecified: Secondary | ICD-10-CM | POA: Diagnosis not present

## 2017-11-14 DIAGNOSIS — I429 Cardiomyopathy, unspecified: Secondary | ICD-10-CM | POA: Diagnosis not present

## 2017-11-14 DIAGNOSIS — I447 Left bundle-branch block, unspecified: Secondary | ICD-10-CM | POA: Diagnosis not present

## 2017-11-14 DIAGNOSIS — Z950 Presence of cardiac pacemaker: Secondary | ICD-10-CM | POA: Diagnosis not present

## 2017-11-14 DIAGNOSIS — Z86718 Personal history of other venous thrombosis and embolism: Secondary | ICD-10-CM | POA: Diagnosis not present

## 2017-11-14 DIAGNOSIS — R0602 Shortness of breath: Secondary | ICD-10-CM | POA: Diagnosis not present

## 2017-11-14 DIAGNOSIS — Z9221 Personal history of antineoplastic chemotherapy: Secondary | ICD-10-CM | POA: Diagnosis not present

## 2017-11-14 HISTORY — PX: OTHER SURGICAL HISTORY: SHX169

## 2017-11-15 ENCOUNTER — Encounter: Payer: Self-pay | Admitting: Nurse Practitioner

## 2017-11-15 ENCOUNTER — Ambulatory Visit (INDEPENDENT_AMBULATORY_CARE_PROVIDER_SITE_OTHER): Payer: Medicare HMO | Admitting: Nurse Practitioner

## 2017-11-15 VITALS — BP 134/68 | HR 65 | Resp 16 | Ht 60.0 in | Wt 250.0 lb

## 2017-11-15 DIAGNOSIS — J45991 Cough variant asthma: Secondary | ICD-10-CM | POA: Diagnosis not present

## 2017-11-15 DIAGNOSIS — G4733 Obstructive sleep apnea (adult) (pediatric): Secondary | ICD-10-CM

## 2017-11-15 DIAGNOSIS — I10 Essential (primary) hypertension: Secondary | ICD-10-CM

## 2017-11-15 DIAGNOSIS — Z23 Encounter for immunization: Secondary | ICD-10-CM

## 2017-11-15 MED ORDER — IPRATROPIUM-ALBUTEROL 0.5-2.5 (3) MG/3ML IN SOLN: 3.0000 mL | RESPIRATORY_TRACT | 1 refills | Status: DC | PRN

## 2017-11-15 NOTE — Progress Notes (Signed)
[facility] [place], [place] [address]  Internal MEDICINE  Office Visit Note  Patient Name: Diana Bernard  [id number]  [id number]  Date of Service: 12/07/2017  Chief Complaint  Patient presents with  . Fatigue    The patient continues to have a great deal of fatigue. Did have defibrillator placed about 6 weeks ago. Had follow up with cardiologist yesterday. Two blood pressure medications were discontinued. Cardiologist believes that bps were too low and contributing to her fatigue. Today is her first day without these medications. Still fatigued, however, blood pressure is bouncing back.       Current Medication: Outpatient Encounter Medications as of 11/15/2017  Medication Sig Note  . docusate sodium (STOOL SOFTENER) 100 MG capsule Take 200 mg by mouth daily.  06/07/2015: Received from: [facility]  . ESOMEPRAZOLE MAGNESIUM PO Take 40 mg by mouth.   [name] FLUoxetine (PROZAC) 40 MG capsule Take 1 capsule (40 mg total) by mouth daily.   . furosemide (LASIX) 20 MG tablet Take 1 tablet (20 mg total) by mouth daily.   [name] gabapentin (NEURONTIN) 300 MG capsule Take 2 cap twice a daily for neuropathy   . lisinopril (PRINIVIL,ZESTRIL) 10 MG tablet Take 1 tablet (10 mg total) by mouth daily. (Patient taking differently: Take 20 mg by mouth daily. )   . Magnesium 250 MG TABS Take by mouth. 06/22/2015: Reports taking once a day  . metoprolol succinate (TOPROL-XL) 25 MG 24 hr tablet Take 12.5 mg by mouth.   . [DISCONTINUED] MAGNESIUM PO Take by mouth.   [name] amLODipine (NORVASC) 10 MG tablet 10 mg. 06/16/2015: Per patient take 1 tablet per day , in am.   . aspirin EC 325 MG tablet Take 325 mg by mouth. 06/16/2015: Takes 1 tablet everyday.   . cholecalciferol (VITAMIN D) 1000 units tablet Take 1,000 Units by mouth daily.   . CYANOCOBALAMIN IJ Inject as directed.   [name] esomeprazole (NEXIUM) 20 MG capsule Take 20 mg by mouth daily at 12 noon.   . hydrochlorothiazide  (HYDRODIURIL) 25 MG tablet Take 25 mg by mouth daily.   [name] HYDROcodone-acetaminophen (NORCO/VICODIN) 5-325 MG tablet Take 1 tablet by mouth every 6 (six) hours as needed for moderate pain. (Patient not taking: Reported on 11/15/2017)   . ibuprofen (ADVIL,MOTRIN) 800 MG tablet Take 800 mg by mouth. Reported on 06/22/2015 06/16/2015: Can take up to 3 tablets per day as needed.   [name] ipratropium-albuterol (DUONEB) 0.5-2.5 (3) MG/3ML SOLN Take 3 mLs by nebulization every 4 (four) hours as needed.   . Lidocaine HCl 3 % GEL Reported on 06/22/2015 06/07/2015: Received from: [facility]  . oxybutynin (DITROPAN-XL) 5 MG 24 hr tablet Take 1 tablet (5 mg total) by mouth 2 (two) times daily. (Patient not taking: Reported on 11/15/2017)   . Potassium 99 MG TABS Take by mouth. 06/16/2015: Takes 2 tablets everyday.  [name] spironolactone (ALDACTONE) 25 MG tablet    . SPS 15 GM/60ML suspension    . sulfamethoxazole-trimethoprim (BACTRIM DS,SEPTRA DS) 800-160 MG tablet Take 1 tablet by mouth 2 (two) times daily. (Patient not taking: Reported on 11/15/2017)   . traMADol (ULTRAM) 50 MG tablet Take 1 tablet (50 mg total) by mouth 2 (two) times daily. One tab po bid prn for pain (Patient not taking: Reported on 11/15/2017)   . [DISCONTINUED] metoprolol succinate (TOPROL-XL) 25 MG 24 hr tablet    . [DISCONTINUED] ipratropium-albuterol (DUONEB) 0.5-2.5 (3) MG/3ML nebulizer solution 3 mL  No facility-administered encounter medications on file as of 11/15/2017.     Surgical History: Past Surgical History:  Procedure Laterality Date  . ABCESS DRAINAGE  2016   Abdominal abcess due to diverticulitis  . caridoverter defibrillator  11/14/2017   ICD  . CATARACT EXTRACTION W/PHACO Left 10/26/2015   Procedure: CATARACT EXTRACTION PHACO AND INTRAOCULAR LENS PLACEMENT (IOC) LEFT EYE;  Surgeon: Leandrew Koyanagi, MD;  Location: [facility];  Service: Ophthalmology;  Laterality: Left;  . CATARACT EXTRACTION W/PHACO Right 12/07/2015    Procedure: CATARACT EXTRACTION PHACO AND INTRAOCULAR LENS PLACEMENT (IOC);  Surgeon: Leandrew Koyanagi, MD;  Location: [facility];  Service: Ophthalmology;  Laterality: Right;  . CHOLECYSTECTOMY    . FOOT SURGERY     Per patient, bilateral foot surgery.  [name] PELVIC FRACTURE SURGERY     Per patient for cancer.    Medical History: Past Medical History:  Diagnosis Date  . Arthritis   . Cancer ([place])   . Deep vein thrombosis (DVT) ([place]) 2015  . Depression   . GERD (gastroesophageal reflux disease)   . Hypertension   . Weakness of both legs     Family History: Family History  Problem Relation Age of Onset  . Depression Sister   . Heart disease Sister   . Early death Sister   . Depression Sister   . Depression Sister   . Depression Sister   . Depression Sister     Social History   Socioeconomic History  . Marital status: Married    Spouse name: Not on file  . Number of children: Not on file  . Years of education: Not on file  . Highest education level: Not on file  Occupational History  . Not on file  Social Needs  . Financial resource strain: Not on file  . Food insecurity:    Worry: Not on file    Inability: Not on file  . Transportation needs:    Medical: Not on file    Non-medical: Not on file  Tobacco Use  . Smoking status: Never Smoker  . Smokeless tobacco: Never Used  Substance and Sexual Activity  . Alcohol use: No  . Drug use: No  . Sexual activity: Not Currently  Lifestyle  . Physical activity:    Days per week: Not on file    Minutes per session: Not on file  . Stress: Not on file  Relationships  . Social connections:    Talks on phone: Not on file    Gets together: Not on file    Attends religious service: Not on file    Active member of club or organization: Not on file    Attends meetings of clubs or organizations: Not on file    Relationship status: Not on file  . Intimate partner violence:    Fear of current or ex partner:  Not on file    Emotionally abused: Not on file    Physically abused: Not on file    Forced sexual activity: Not on file  Other Topics Concern  . Not on file  Social History Narrative  . Not on file      Review of Systems  Constitutional: Positive for fatigue. Negative for activity change and appetite change.  HENT: Negative for congestion, postnasal drip and rhinorrhea.   Eyes: Negative.   Respiratory: Negative for cough and chest tightness.        Shortness of breath and fatigue with exertion. Slightly improved since her last visit.  Cardiovascular: Negative for chest pain, palpitations and leg swelling.  Gastrointestinal: Negative for abdominal pain, diarrhea and nausea.       Single episode of diarrhea yesterday.   Genitourinary: Positive for urgency. Negative for dysuria, flank pain and frequency.  Musculoskeletal: Positive for arthralgias and back pain.  Skin: Negative for rash.  Allergic/Immunologic: Negative.   Neurological: Negative for dizziness, weakness and headaches.  Hematological: Negative for adenopathy.  Psychiatric/Behavioral: Negative for decreased concentration and dysphoric mood. The patient is nervous/anxious.     Today's Vitals   11/15/17 0955  BP: 134/68  Pulse: 65  Resp: 16  SpO2: 97%  Weight: 250 lb (113.4 kg)  Height: 5' (1.524 m)    Physical Exam  Constitutional: She is oriented to person, place, and time. She appears well-developed and well-nourished. No distress.  HENT:  Head: Normocephalic and atraumatic.  Mouth/Throat: Oropharynx is clear and moist. No oropharyngeal exudate.  Eyes: Pupils are equal, round, and reactive to light. EOM are normal.  Neck: Normal range of motion. No JVD present. No tracheal deviation present. No thyromegaly present.  Cardiovascular: Normal rate, regular rhythm and normal heart sounds. Exam reveals no gallop and no friction rub.  No murmur heard. Pulmonary/Chest: Effort normal. No respiratory distress. She  has wheezes. She has no rales. She exhibits tenderness.  Abdominal: Soft. Bowel sounds are normal.  Lymphadenopathy:    She has no cervical adenopathy.  Neurological: She is alert and oriented to person, place, and time. No cranial nerve deficit.  Skin: Skin is warm and dry. She is not diaphoretic.  Psychiatric: She has a normal mood and affect. Her behavior is normal.  Nursing note and vitals reviewed.  Assessment/Plan:  1. Essential hypertension Well managed. Continue bp medication as prescribed.   2. Cough variant asthma - ipratropium-albuterol (DUONEB) 0.5-2.5 (3) MG/3ML SOLN; Take 3 mLs by nebulization every 4 (four) hours as needed.  Dispense: 360 mL; Refill: 1  3. Obstructive sleep apnea (adult) (pediatric) Continue regular visits with sleep medicine for CPAP management  4. Need for vaccination against Streptococcus pneumoniae using pneumococcal conjugate vaccine 13 - Pneumococcal conjugate vaccine 13-valent IM   General Counseling: Malee verbalizes understanding of the findings of todays visit and agrees with plan of treatment. I have discussed any further diagnostic evaluation that may be needed or ordered today. We also reviewed her medications today. she has been encouraged to call the office with any questions or concerns that should arise related to todays visit.    Orders Placed This Encounter  Procedures  . Pneumococcal conjugate vaccine 13-valent IM    Meds ordered this encounter  Medications  . ipratropium-albuterol (DUONEB) 0.5-2.5 (3) MG/3ML SOLN    Sig: Take 3 mLs by nebulization every 4 (four) hours as needed.    Dispense:  360 mL    Refill:  1    Order Specific Question:   Supervising Provider    Answer:   Lavera Guise [[id number]]    Time spent: 63 Minutes      Dr Lavera Guise Internal medicine

## 2017-11-20 DIAGNOSIS — I110 Hypertensive heart disease with heart failure: Secondary | ICD-10-CM | POA: Diagnosis not present

## 2017-11-20 DIAGNOSIS — I420 Dilated cardiomyopathy: Secondary | ICD-10-CM | POA: Diagnosis not present

## 2017-11-20 DIAGNOSIS — Z4502 Encounter for adjustment and management of automatic implantable cardiac defibrillator: Secondary | ICD-10-CM | POA: Diagnosis not present

## 2017-11-20 DIAGNOSIS — I5022 Chronic systolic (congestive) heart failure: Secondary | ICD-10-CM | POA: Diagnosis not present

## 2017-11-20 DIAGNOSIS — T82120A Displacement of cardiac electrode, initial encounter: Secondary | ICD-10-CM | POA: Insufficient documentation

## 2017-11-20 DIAGNOSIS — Z9049 Acquired absence of other specified parts of digestive tract: Secondary | ICD-10-CM | POA: Diagnosis not present

## 2017-11-20 DIAGNOSIS — Z86718 Personal history of other venous thrombosis and embolism: Secondary | ICD-10-CM | POA: Diagnosis not present

## 2017-11-20 DIAGNOSIS — Z95810 Presence of automatic (implantable) cardiac defibrillator: Secondary | ICD-10-CM | POA: Diagnosis not present

## 2017-11-20 DIAGNOSIS — I447 Left bundle-branch block, unspecified: Secondary | ICD-10-CM | POA: Diagnosis not present

## 2017-12-06 DIAGNOSIS — Z4502 Encounter for adjustment and management of automatic implantable cardiac defibrillator: Secondary | ICD-10-CM | POA: Diagnosis not present

## 2017-12-06 DIAGNOSIS — I429 Cardiomyopathy, unspecified: Secondary | ICD-10-CM | POA: Diagnosis not present

## 2017-12-07 DIAGNOSIS — Z23 Encounter for immunization: Secondary | ICD-10-CM | POA: Insufficient documentation

## 2017-12-07 DIAGNOSIS — J45991 Cough variant asthma: Secondary | ICD-10-CM | POA: Insufficient documentation

## 2017-12-10 ENCOUNTER — Other Ambulatory Visit: Payer: Self-pay

## 2017-12-10 MED ORDER — GABAPENTIN 300 MG PO CAPS: ORAL_CAPSULE | ORAL | 1 refills | Status: DC

## 2017-12-12 DIAGNOSIS — I5022 Chronic systolic (congestive) heart failure: Secondary | ICD-10-CM | POA: Diagnosis not present

## 2017-12-12 DIAGNOSIS — T82120A Displacement of cardiac electrode, initial encounter: Secondary | ICD-10-CM | POA: Diagnosis not present

## 2017-12-12 DIAGNOSIS — F419 Anxiety disorder, unspecified: Secondary | ICD-10-CM | POA: Diagnosis not present

## 2017-12-12 DIAGNOSIS — R0989 Other specified symptoms and signs involving the circulatory and respiratory systems: Secondary | ICD-10-CM | POA: Diagnosis not present

## 2017-12-12 DIAGNOSIS — Z4502 Encounter for adjustment and management of automatic implantable cardiac defibrillator: Secondary | ICD-10-CM | POA: Diagnosis not present

## 2017-12-12 DIAGNOSIS — I447 Left bundle-branch block, unspecified: Secondary | ICD-10-CM | POA: Diagnosis not present

## 2017-12-12 DIAGNOSIS — I420 Dilated cardiomyopathy: Secondary | ICD-10-CM | POA: Diagnosis not present

## 2017-12-12 DIAGNOSIS — F329 Major depressive disorder, single episode, unspecified: Secondary | ICD-10-CM | POA: Diagnosis not present

## 2017-12-12 DIAGNOSIS — M1990 Unspecified osteoarthritis, unspecified site: Secondary | ICD-10-CM | POA: Diagnosis not present

## 2017-12-12 DIAGNOSIS — I499 Cardiac arrhythmia, unspecified: Secondary | ICD-10-CM | POA: Diagnosis not present

## 2017-12-12 DIAGNOSIS — I110 Hypertensive heart disease with heart failure: Secondary | ICD-10-CM | POA: Diagnosis not present

## 2017-12-12 DIAGNOSIS — T82120S Displacement of cardiac electrode, sequela: Secondary | ICD-10-CM | POA: Diagnosis not present

## 2017-12-19 DIAGNOSIS — I447 Left bundle-branch block, unspecified: Secondary | ICD-10-CM | POA: Diagnosis not present

## 2017-12-19 DIAGNOSIS — I420 Dilated cardiomyopathy: Secondary | ICD-10-CM | POA: Diagnosis not present

## 2017-12-19 DIAGNOSIS — I509 Heart failure, unspecified: Secondary | ICD-10-CM | POA: Diagnosis not present

## 2017-12-19 DIAGNOSIS — Z4502 Encounter for adjustment and management of automatic implantable cardiac defibrillator: Secondary | ICD-10-CM | POA: Diagnosis not present

## 2017-12-31 DIAGNOSIS — G4733 Obstructive sleep apnea (adult) (pediatric): Secondary | ICD-10-CM | POA: Diagnosis not present

## 2018-01-13 ENCOUNTER — Ambulatory Visit: Payer: Self-pay | Admitting: Internal Medicine

## 2018-01-20 ENCOUNTER — Encounter: Payer: Self-pay | Admitting: Nurse Practitioner

## 2018-01-20 ENCOUNTER — Ambulatory Visit (INDEPENDENT_AMBULATORY_CARE_PROVIDER_SITE_OTHER): Payer: Medicare HMO | Admitting: Nurse Practitioner

## 2018-01-20 VITALS — BP 119/86 | HR 71 | Resp 16 | Ht 61.0 in | Wt 239.6 lb

## 2018-01-20 DIAGNOSIS — R110 Nausea: Secondary | ICD-10-CM

## 2018-01-20 DIAGNOSIS — K529 Noninfective gastroenteritis and colitis, unspecified: Secondary | ICD-10-CM

## 2018-01-20 DIAGNOSIS — R197 Diarrhea, unspecified: Secondary | ICD-10-CM

## 2018-01-20 DIAGNOSIS — I5022 Chronic systolic (congestive) heart failure: Secondary | ICD-10-CM | POA: Diagnosis not present

## 2018-01-20 MED ORDER — DIPHENOXYLATE-ATROPINE 2.5-0.025 MG PO TABS: 1.0000 | ORAL_TABLET | Freq: Four times a day (QID) | ORAL | 0 refills | Status: DC | PRN

## 2018-01-20 MED ORDER — CIPROFLOXACIN HCL 500 MG PO TABS: 500.0000 mg | ORAL_TABLET | Freq: Two times a day (BID) | ORAL | 0 refills | Status: DC

## 2018-01-20 MED ORDER — ONDANSETRON HCL 8 MG PO TABS: 8.0000 mg | ORAL_TABLET | Freq: Three times a day (TID) | ORAL | 0 refills | Status: DC | PRN

## 2018-01-20 NOTE — Progress Notes (Signed)
[facility] [place], [place] [address]  Internal MEDICINE  Office Visit Note  Patient Name: Diana Bernard  [id number]  [id number]  Date of Service: 01/26/2018   Pt is here for a sick visit.   Chief Complaint  Patient presents with  . Vomiting    took imodium and pepto bismol nothing is working, still Art therapist  . Diarrhea    been going on since friday, very weak, no fever, no chills  . Fatigue     Patient states that she has been having gneralized abdominal pain and cramping. She has also had nausea, vomiting, and diarrhea. These symptoms have been going on since Friday. Had to take 4 imodium to stop having such frequent bowel movements to come to the office. She is unable to tolerate much by mouth and is feeling very weak. Concern because she does have significant history o diverticulitis in left lower quadrant of the abdomen.       Current Medication:  Outpatient Encounter Medications as of 01/20/2018  Medication Sig Note  . amLODipine (NORVASC) 10 MG tablet 10 mg. 06/16/2015: Per patient take 1 tablet per day , in am.   . aspirin EC 325 MG tablet Take 325 mg by mouth. 06/16/2015: Takes 1 tablet everyday.   . cholecalciferol (VITAMIN D) 1000 units tablet Take 1,000 Units by mouth daily.   . CYANOCOBALAMIN IJ Inject as directed.   . docusate sodium (STOOL SOFTENER) 100 MG capsule Take 200 mg by mouth daily.  06/07/2015: Received from: [facility]  . esomeprazole (NEXIUM) 20 MG capsule Take 20 mg by mouth daily at 12 noon.   [name] ESOMEPRAZOLE MAGNESIUM PO Take 40 mg by mouth.   [name] FLUoxetine (PROZAC) 40 MG capsule Take 1 capsule (40 mg total) by mouth daily.   . furosemide (LASIX) 20 MG tablet Take 1 tablet (20 mg total) by mouth daily.   [name] gabapentin (NEURONTIN) 300 MG capsule Take 2 cap twice a daily for neuropathy   . hydrochlorothiazide (HYDRODIURIL) 25 MG tablet Take 25 mg by mouth daily.   [name] ibuprofen (ADVIL,MOTRIN) 800 MG tablet  Take 800 mg by mouth. Reported on 06/22/2015 06/16/2015: Can take up to 3 tablets per day as needed.   [name] ipratropium-albuterol (DUONEB) 0.5-2.5 (3) MG/3ML SOLN Take 3 mLs by nebulization every 4 (four) hours as needed.   . Lidocaine HCl 3 % GEL Reported on 06/22/2015 06/07/2015: Received from: [facility]  . lisinopril (PRINIVIL,ZESTRIL) 10 MG tablet Take 1 tablet (10 mg total) by mouth daily. (Patient taking differently: Take 20 mg by mouth daily. )   . Magnesium 250 MG TABS Take by mouth. 06/22/2015: Reports taking once a day  . metoprolol succinate (TOPROL-XL) 25 MG 24 hr tablet Take 12.5 mg by mouth.   . Potassium 99 MG TABS Take by mouth. 06/16/2015: Takes 2 tablets everyday.  [name] spironolactone (ALDACTONE) 25 MG tablet    . SPS 15 GM/60ML suspension    . ciprofloxacin (CIPRO) 500 MG tablet Take 1 tablet (500 mg total) by mouth 2 (two) times daily.   . diphenoxylate-atropine (LOMOTIL) 2.5-0.025 MG tablet Take 1 tablet by mouth 4 (four) times daily as needed for diarrhea or loose stools.   [name] HYDROcodone-acetaminophen (NORCO/VICODIN) 5-325 MG tablet Take 1 tablet by mouth every 6 (six) hours as needed for moderate pain. (Patient not taking: Reported on 11/15/2017)   . ondansetron (ZOFRAN) 8 MG tablet Take 1 tablet (8 mg total) by mouth every  8 (eight) hours as needed for nausea or vomiting.   [name] oxybutynin (DITROPAN-XL) 5 MG 24 hr tablet Take 1 tablet (5 mg total) by mouth 2 (two) times daily. (Patient not taking: Reported on 11/15/2017)   . sulfamethoxazole-trimethoprim (BACTRIM DS,SEPTRA DS) 800-160 MG tablet Take 1 tablet by mouth 2 (two) times daily. (Patient not taking: Reported on 11/15/2017)   . traMADol (ULTRAM) 50 MG tablet Take 1 tablet (50 mg total) by mouth 2 (two) times daily. One tab po bid prn for pain (Patient not taking: Reported on 11/15/2017)    No facility-administered encounter medications on file as of 01/20/2018.       Medical History: Past Medical History:  Diagnosis Date  .  Arthritis   . Cancer ([place])   . Deep vein thrombosis (DVT) ([place]) 2015  . Depression   . GERD (gastroesophageal reflux disease)   . Hypertension   . Weakness of both legs    Today's Vitals   01/20/18 1130  BP: 119/86  Pulse: 71  Resp: 16  SpO2: 98%  Weight: 239 lb 9.6 oz (108.7 kg)  Height: 5\' 1"  (1.549 m)       Review of Systems  Constitutional: Positive for fatigue. Negative for activity change and appetite change.  HENT: Negative for congestion, postnasal drip and rhinorrhea.   Eyes: Negative.   Respiratory: Negative for cough and chest tightness.        Shortness of breath and fatigue with exertion. Slightly improved since her last visit.   Cardiovascular: Negative for chest pain, palpitations and leg swelling.  Gastrointestinal: Negative for abdominal pain, diarrhea and nausea.       Single episode of diarrhea yesterday.   Genitourinary: Positive for urgency. Negative for dysuria, flank pain and frequency.  Musculoskeletal: Positive for arthralgias and back pain.  Skin: Negative for rash.  Allergic/Immunologic: Negative.   Neurological: Negative for dizziness, weakness and headaches.  Hematological: Negative for adenopathy.  Psychiatric/Behavioral: Negative for decreased concentration and dysphoric mood. The patient is nervous/anxious.     Physical Exam  Constitutional: She is oriented to person, place, and time. She appears well-developed and well-nourished. No distress.  HENT:  Head: Normocephalic and atraumatic.  Nose: Nose normal.  Mouth/Throat: Oropharynx is clear and moist. No oropharyngeal exudate.  Eyes: Pupils are equal, round, and reactive to light. Conjunctivae and EOM are normal.  Neck: Normal range of motion. No JVD present. No tracheal deviation present. No thyromegaly present.  Cardiovascular: Normal rate, regular rhythm and normal heart sounds. Exam reveals no gallop and no friction rub.  No murmur heard. Pulmonary/Chest: Effort normal and breath  sounds normal. No respiratory distress. She has no wheezes. She has no rales. She exhibits no tenderness.  Abdominal: Soft. Bowel sounds are increased. There is generalized tenderness.  Musculoskeletal:  Generalized joint pain without point tenderness present.   Lymphadenopathy:    She has no cervical adenopathy.  Neurological: She is alert and oriented to person, place, and time. No cranial nerve deficit.  Skin: Skin is warm and dry. She is not diaphoretic.  Psychiatric: She has a normal mood and affect. Her behavior is normal. Judgment and thought content normal.  Nursing note and vitals reviewed.  Assessment/Plan: 1. Gastroenteritis, acute Potentially bacterial in nature. Add cipro 500mg  twice daily for 10 days. Rest and increase fluids. Recommend BRAT diet. Will consider CT scan of abdomen and pelvis if symptoms persist as she does have significant history of diverticulitis.  - ciprofloxacin (CIPRO) 500 MG tablet; Take 1  tablet (500 mg total) by mouth 2 (two) times daily.  Dispense: 20 tablet; Refill: 0  2. Diarrhea, unspecified type Add lomotil which may be taken three to four times daily as needed for acute diarrhea.  - diphenoxylate-atropine (LOMOTIL) 2.5-0.025 MG tablet; Take 1 tablet by mouth 4 (four) times daily as needed for diarrhea or loose stools.  Dispense: 30 tablet; Refill: 0  3. Nausea zofran 8mg  may be taken three times daily as needed for nausea. BRAT diet recommended. - ondansetron (ZOFRAN) 8 MG tablet; Take 1 tablet (8 mg total) by mouth every 8 (eight) hours as needed for nausea or vomiting.  Dispense: 30 tablet; Refill: 0  4. Chronic systolic heart failure ([place]) Recently had defibrillator placed. Continue regular visits with cardiology.   General Counseling: Diana Bernard verbalizes understanding of the findings of todays visit and agrees with plan of treatment. I have discussed any further diagnostic evaluation that may be needed or ordered today. We also reviewed her  medications today. she has been encouraged to call the office with any questions or concerns that should arise related to todays visit.    Counseling:  Rest and increase fluids. Continue using OTC medication to control symptoms.   The 'BRAT' diet is suggested, then progress to diet as tolerated as symptoms abate. Call if bloody stools, persistent diarrhea, vomiting, fever or abdominal pain.  This patient was seen by [place] with Dr Lavera Guise as a part of collaborative care agreement    Meds ordered this encounter  Medications  . ciprofloxacin (CIPRO) 500 MG tablet    Sig: Take 1 tablet (500 mg total) by mouth 2 (two) times daily.    Dispense:  20 tablet    Refill:  0    Order Specific Question:   Supervising Provider    Answer:   Lavera Guise [[id number]]  . ondansetron (ZOFRAN) 8 MG tablet    Sig: Take 1 tablet (8 mg total) by mouth every 8 (eight) hours as needed for nausea or vomiting.    Dispense:  30 tablet    Refill:  0    Order Specific Question:   Supervising Provider    Answer:   Lavera Guise [[id number]]  . diphenoxylate-atropine (LOMOTIL) 2.5-0.025 MG tablet    Sig: Take 1 tablet by mouth 4 (four) times daily as needed for diarrhea or loose stools.    Dispense:  30 tablet    Refill:  0    Order Specific Question:   Supervising Provider    Answer:   Lavera Guise [[id number]]    Time spent: 15 Minutes

## 2018-01-21 DIAGNOSIS — C541 Malignant neoplasm of endometrium: Secondary | ICD-10-CM | POA: Diagnosis not present

## 2018-01-26 DIAGNOSIS — K529 Noninfective gastroenteritis and colitis, unspecified: Secondary | ICD-10-CM | POA: Insufficient documentation

## 2018-01-26 DIAGNOSIS — R110 Nausea: Secondary | ICD-10-CM | POA: Insufficient documentation

## 2018-01-26 DIAGNOSIS — R197 Diarrhea, unspecified: Secondary | ICD-10-CM | POA: Insufficient documentation

## 2018-01-26 DIAGNOSIS — I5022 Chronic systolic (congestive) heart failure: Secondary | ICD-10-CM | POA: Insufficient documentation

## 2018-01-30 DIAGNOSIS — G4733 Obstructive sleep apnea (adult) (pediatric): Secondary | ICD-10-CM | POA: Diagnosis not present

## 2018-02-03 DIAGNOSIS — R936 Abnormal findings on diagnostic imaging of limbs: Secondary | ICD-10-CM | POA: Diagnosis not present

## 2018-02-03 DIAGNOSIS — S93601A Unspecified sprain of right foot, initial encounter: Secondary | ICD-10-CM | POA: Diagnosis not present

## 2018-02-03 DIAGNOSIS — M19071 Primary osteoarthritis, right ankle and foot: Secondary | ICD-10-CM | POA: Diagnosis not present

## 2018-02-03 DIAGNOSIS — M79671 Pain in right foot: Secondary | ICD-10-CM | POA: Diagnosis not present

## 2018-02-05 ENCOUNTER — Other Ambulatory Visit: Payer: Self-pay

## 2018-02-05 MED ORDER — AMLODIPINE BESYLATE 10 MG PO TABS: ORAL_TABLET | ORAL | 1 refills | Status: DC

## 2018-02-07 ENCOUNTER — Other Ambulatory Visit: Payer: Self-pay

## 2018-02-07 DIAGNOSIS — Z4502 Encounter for adjustment and management of automatic implantable cardiac defibrillator: Secondary | ICD-10-CM | POA: Diagnosis not present

## 2018-02-07 DIAGNOSIS — I429 Cardiomyopathy, unspecified: Secondary | ICD-10-CM | POA: Diagnosis not present

## 2018-02-07 MED ORDER — FLUOXETINE HCL 40 MG PO CAPS
40.0000 mg | ORAL_CAPSULE | Freq: Every day | ORAL | 1 refills | Status: DC
Start: 2018-02-07 — End: 2018-07-17

## 2018-03-02 DIAGNOSIS — G4733 Obstructive sleep apnea (adult) (pediatric): Secondary | ICD-10-CM | POA: Diagnosis not present

## 2018-03-05 ENCOUNTER — Ambulatory Visit (INDEPENDENT_AMBULATORY_CARE_PROVIDER_SITE_OTHER): Payer: Medicare HMO

## 2018-03-05 DIAGNOSIS — G4733 Obstructive sleep apnea (adult) (pediatric): Secondary | ICD-10-CM | POA: Diagnosis not present

## 2018-03-05 NOTE — Progress Notes (Signed)
95 percentile pressure 10   95th percentile leak 28.3   apnea index 1.0 /hr  apnea-hypopnea index  1.5 /hr   total days used  >4 hr 84 days  total days used <4 hr 6 days  Total compliance 93 percent  No problems or questions at this time

## 2018-03-11 ENCOUNTER — Encounter: Payer: Self-pay | Admitting: Internal Medicine

## 2018-03-18 ENCOUNTER — Other Ambulatory Visit: Payer: Self-pay | Admitting: Nurse Practitioner

## 2018-03-18 ENCOUNTER — Encounter: Payer: Self-pay | Admitting: Nurse Practitioner

## 2018-03-18 ENCOUNTER — Ambulatory Visit (INDEPENDENT_AMBULATORY_CARE_PROVIDER_SITE_OTHER): Payer: Medicare HMO | Admitting: Nurse Practitioner

## 2018-03-18 VITALS — BP 169/68 | HR 61 | Resp 16 | Ht 61.0 in | Wt 248.2 lb

## 2018-03-18 DIAGNOSIS — G8929 Other chronic pain: Secondary | ICD-10-CM

## 2018-03-18 DIAGNOSIS — R0602 Shortness of breath: Secondary | ICD-10-CM

## 2018-03-18 DIAGNOSIS — Z0001 Encounter for general adult medical examination with abnormal findings: Secondary | ICD-10-CM

## 2018-03-18 DIAGNOSIS — G4733 Obstructive sleep apnea (adult) (pediatric): Secondary | ICD-10-CM

## 2018-03-18 DIAGNOSIS — M5441 Lumbago with sciatica, right side: Secondary | ICD-10-CM | POA: Diagnosis not present

## 2018-03-18 DIAGNOSIS — I10 Essential (primary) hypertension: Secondary | ICD-10-CM | POA: Diagnosis not present

## 2018-03-18 DIAGNOSIS — R5383 Other fatigue: Secondary | ICD-10-CM | POA: Diagnosis not present

## 2018-03-18 DIAGNOSIS — R300 Dysuria: Secondary | ICD-10-CM

## 2018-03-18 DIAGNOSIS — M5442 Lumbago with sciatica, left side: Secondary | ICD-10-CM

## 2018-03-18 DIAGNOSIS — E559 Vitamin D deficiency, unspecified: Secondary | ICD-10-CM | POA: Diagnosis not present

## 2018-03-18 DIAGNOSIS — D519 Vitamin B12 deficiency anemia, unspecified: Secondary | ICD-10-CM | POA: Diagnosis not present

## 2018-03-18 MED ORDER — HYDROCODONE-ACETAMINOPHEN 5-325 MG PO TABS: 1.0000 | ORAL_TABLET | Freq: Four times a day (QID) | ORAL | 0 refills | Status: DC | PRN

## 2018-03-18 NOTE — Progress Notes (Signed)
[facility] [place], [place] [address]  Internal MEDICINE  Office Visit Note  Patient Name: Diana Bernard  [id number]  [id number]  Date of Service: 03/20/2018   Pt is here for routine health maintenance examination   Chief Complaint  Patient presents with  . Medicare Wellness    well visit,   . Hypertension  . Gastroesophageal Reflux  . Pain    pt aching from waist down, its getting hard for her to walk, stated that her feet feels like a block of ice     The patient continues to have a great deal of fatigue and increased shortness of breath, especially with exertion. She sees cardiology on regular basis. Has a pacemaker. Is using her CPAP every day.    Current Medication: Outpatient Encounter Medications as of 03/18/2018  Medication Sig Note  . amLODipine (NORVASC) 10 MG tablet Take 1 tab po daily   . aspirin EC 325 MG tablet Take 325 mg by mouth. 06/16/2015: Takes 1 tablet everyday.   . cholecalciferol (VITAMIN D) 1000 units tablet Take 1,000 Units by mouth daily.   . CYANOCOBALAMIN IJ Inject as directed.   . diphenoxylate-atropine (LOMOTIL) 2.5-0.025 MG tablet Take 1 tablet by mouth 4 (four) times daily as needed for diarrhea or loose stools.   . docusate sodium (STOOL SOFTENER) 100 MG capsule Take 200 mg by mouth daily.  06/07/2015: Received from: [facility]  . esomeprazole (NEXIUM) 20 MG capsule Take 20 mg by mouth daily at 12 noon.   [name] ESOMEPRAZOLE MAGNESIUM PO Take 40 mg by mouth.   [name] FLUoxetine (PROZAC) 40 MG capsule Take 1 capsule (40 mg total) by mouth daily.   . furosemide (LASIX) 20 MG tablet Take 1 tablet (20 mg total) by mouth daily.   [name] gabapentin (NEURONTIN) 300 MG capsule Take 2 cap twice a daily for neuropathy   . hydrochlorothiazide (HYDRODIURIL) 25 MG tablet Take 25 mg by mouth daily.   [name] ibuprofen (ADVIL,MOTRIN) 800 MG tablet Take 800 mg by mouth. Reported on 06/22/2015 06/16/2015: Can take up to 3 tablets per day as  needed.   [name] ipratropium-albuterol (DUONEB) 0.5-2.5 (3) MG/3ML SOLN Take 3 mLs by nebulization every 4 (four) hours as needed.   . Lidocaine HCl 3 % GEL Reported on 06/22/2015 06/07/2015: Received from: [facility]  . lisinopril (PRINIVIL,ZESTRIL) 10 MG tablet Take 1 tablet (10 mg total) by mouth daily. (Patient taking differently: Take 20 mg by mouth daily. )   . lisinopril (PRINIVIL,ZESTRIL) 20 MG tablet Take 1 tablet by mouth.   . Magnesium 250 MG TABS Take by mouth. 06/22/2015: Reports taking once a day  . metoprolol succinate (TOPROL-XL) 25 MG 24 hr tablet Take 12.5 mg by mouth.   . ondansetron (ZOFRAN) 8 MG tablet Take 1 tablet (8 mg total) by mouth every 8 (eight) hours as needed for nausea or vomiting.   . Potassium 99 MG TABS Take by mouth. 06/16/2015: Takes 2 tablets everyday.  [name] spironolactone (ALDACTONE) 25 MG tablet    . SPS 15 GM/60ML suspension    . [DISCONTINUED] ciprofloxacin (CIPRO) 500 MG tablet Take 1 tablet (500 mg total) by mouth 2 (two) times daily.   [name] HYDROcodone-acetaminophen (NORCO/VICODIN) 5-325 MG tablet Take 1 tablet by mouth every 6 (six) hours as needed for moderate pain.   [name] oxybutynin (DITROPAN-XL) 5 MG 24 hr tablet Take 1 tablet (5 mg total) by mouth 2 (two) times daily. (Patient not taking: Reported on  11/15/2017)   . sulfamethoxazole-trimethoprim (BACTRIM DS,SEPTRA DS) 800-160 MG tablet Take 1 tablet by mouth 2 (two) times daily. (Patient not taking: Reported on 11/15/2017)   . traMADol (ULTRAM) 50 MG tablet Take 1 tablet (50 mg total) by mouth 2 (two) times daily. One tab po bid prn for pain (Patient not taking: Reported on 11/15/2017)   . [DISCONTINUED] HYDROcodone-acetaminophen (NORCO/VICODIN) 5-325 MG tablet Take 1 tablet by mouth every 6 (six) hours as needed for moderate pain. (Patient not taking: Reported on 11/15/2017)    No facility-administered encounter medications on file as of 03/18/2018.     Surgical History: Past Surgical History:  Procedure  Laterality Date  . ABCESS DRAINAGE  2016   Abdominal abcess due to diverticulitis  . caridoverter defibrillator  11/14/2017   ICD  . CATARACT EXTRACTION W/PHACO Left 10/26/2015   Procedure: CATARACT EXTRACTION PHACO AND INTRAOCULAR LENS PLACEMENT (IOC) LEFT EYE;  Surgeon: Leandrew Koyanagi, MD;  Location: [place];  Service: Ophthalmology;  Laterality: Left;  . CATARACT EXTRACTION W/PHACO Right 12/07/2015   Procedure: CATARACT EXTRACTION PHACO AND INTRAOCULAR LENS PLACEMENT (IOC);  Surgeon: Leandrew Koyanagi, MD;  Location: [place];  Service: Ophthalmology;  Laterality: Right;  . CHOLECYSTECTOMY    . FOOT SURGERY     Per patient, bilateral foot surgery.  [name] PELVIC FRACTURE SURGERY     Per patient for cancer.    Medical History: Past Medical History:  Diagnosis Date  . Arthritis   . Cancer ([place])   . Deep vein thrombosis (DVT) ([place]) 2015  . Depression   . GERD (gastroesophageal reflux disease)   . Hypertension   . Weakness of both legs     Family History: Family History  Problem Relation Age of Onset  . Depression Sister   . Heart disease Sister   . Early death Sister   . Depression Sister   . Depression Sister   . Depression Sister   . Depression Sister       Review of Systems  Constitutional: Positive for activity change and fatigue. Negative for chills and unexpected weight change.  HENT: Negative for congestion, postnasal drip, rhinorrhea, sneezing and sore throat.   Respiratory: Positive for shortness of breath. Negative for cough, chest tightness and wheezing.   Cardiovascular: Negative for chest pain and palpitations.       Elevated blood pressure   Gastrointestinal: Negative for abdominal pain, constipation, diarrhea, nausea and vomiting.  Endocrine: Negative for cold intolerance, heat intolerance, polydipsia and polyuria.  Genitourinary: Negative for dysuria, flank pain and frequency.  Musculoskeletal: Positive for arthralgias and back  pain. Negative for joint swelling and neck pain.  Skin: Negative for rash.  Allergic/Immunologic: Negative for environmental allergies.  Neurological: Positive for weakness. Negative for dizziness, tremors, numbness and headaches.  Hematological: Negative for adenopathy. Does not bruise/bleed easily.  Psychiatric/Behavioral: Negative.  Negative for behavioral problems (Depression), sleep disturbance and suicidal ideas. The patient is not nervous/anxious.     Today's Vitals   03/18/18 1227  BP: (!) 169/68  Pulse: 61  Resp: 16  SpO2: 97%  Weight: 248 lb 3.2 oz (112.6 kg)  Height: 5\' 1"  (1.549 m)    Physical Exam  Constitutional: She is oriented to person, place, and time. She appears well-developed and well-nourished. No distress.  HENT:  Head: Normocephalic and atraumatic.  Nose: Nose normal.  Mouth/Throat: No oropharyngeal exudate.  Eyes: Pupils are equal, round, and reactive to light. Conjunctivae and EOM are normal.  Neck: Normal range of motion.  Neck supple. No JVD present. Carotid bruit is not present. No tracheal deviation present. No thyromegaly present.  Cardiovascular: Normal rate, regular rhythm, normal heart sounds and intact distal pulses. Exam reveals no gallop and no friction rub.  No murmur heard. Pulmonary/Chest: Effort normal and breath sounds normal. No respiratory distress. She has no wheezes. She has no rales. She exhibits no tenderness.  Abdominal: Soft. Bowel sounds are normal. There is no tenderness.  Musculoskeletal: Normal range of motion.  Moderate and intermittent lower back pain which is worse with exertion. Has taken tylenol for this which does not work at all .  Lymphadenopathy:    She has no cervical adenopathy.  Neurological: She is alert and oriented to person, place, and time. No cranial nerve deficit.  Skin: Skin is warm and dry. She is not diaphoretic.  Psychiatric: Her speech is normal and behavior is normal. Judgment and thought content normal.  Cognition and memory are normal. She exhibits a depressed mood.  Nursing note and vitals reviewed.  Depression screen [facility] 2/9 03/18/2018 01/20/2018 11/15/2017 09/12/2017 07/11/2017  Decreased Interest 0 0 0 0 0  Down, Depressed, Hopeless 0 0 0 0 0  PHQ - 2 Score 0 0 0 0 0    Functional Status Survey: Is the patient deaf or have difficulty hearing?: No Does the patient have difficulty seeing, even when wearing glasses/contacts?: No Does the patient have difficulty concentrating, remembering, or making decisions?: No Does the patient have difficulty walking or climbing stairs?: Yes(walking is getting worse) Does the patient have difficulty dressing or bathing?: No Does the patient have difficulty doing errands alone such as visiting a doctor's office or shopping?: No  MMSE - Mini Mental State Exam 03/18/2018  Orientation to time 5  Orientation to Place 5  Registration 3  Attention/ Calculation 5  Recall 3  Language- name 2 objects 2  Language- repeat 1  Language- follow 3 step command 3  Language- read & follow direction 1  Write a sentence 1  Copy design 1  Total score 30    Fall Risk  03/18/2018 01/20/2018 11/15/2017 09/12/2017 07/11/2017  Falls in the past year? 0 Yes Yes Yes Yes  Number falls in past yr: - 1 2 or more 1 1  Injury with Fall? - No No - No  Risk for fall due to : - - - - -  Follow up - - - - -     LABS: Recent Results (from the past 2160 hour(s))  UA/M w/rflx Culture, Routine     Status: Abnormal   Collection Time: 03/18/18 12:29 PM  Result Value Ref Range   Specific Gravity, UA 1.023 1.005 - 1.030   pH, UA 5.0 5.0 - 7.5   Color, UA Yellow Yellow   Appearance Ur Clear Clear   Leukocytes, UA Trace (A) Negative   Protein, UA Negative Negative/Trace   Glucose, UA Negative Negative   Ketones, UA Negative Negative   RBC, UA Negative Negative   Bilirubin, UA Negative Negative   Urobilinogen, Ur 0.2 0.2 - 1.0 mg/dL   Nitrite, UA Negative Negative    Microscopic Examination See below:     Comment: Microscopic was indicated and was performed.   Urinalysis Reflex Comment     Comment: This specimen has reflexed to a Urine Culture.  Microscopic Examination     Status: Abnormal   Collection Time: 03/18/18 12:29 PM  Result Value Ref Range   WBC, UA 11-30 (A) 0 - 5 /hpf  RBC, UA 0-2 0 - 2 /hpf   Epithelial Cells (non renal) None seen 0 - 10 /hpf   Casts None seen None seen /lpf   Mucus, UA Present Not Estab.   Bacteria, UA None seen None seen/Few  Urine Culture, Reflex     Status: None   Collection Time: 03/18/18 12:29 PM  Result Value Ref Range   Urine Culture, Routine Final report    Organism ID, Bacteria Comment     Comment: Mixed urogenital flora Less than 10,000 colonies/mL    Assessment/Plan:  1. Encounter for general adult medical examination with abnormal findings Annual health maintenance exam today  2. Essential hypertension Generally stable. Continue bp medications as prescribed.   3. Other fatigue Will check labs, including full thyroid and anemia panels and treat as indicated.  4. Chronic bilateral low back pain with bilateral sciatica Short term prescription for hydrocodone/APAP 5/325mg  tablets given. Advised patient to use only as needed and only if not driving. Prescription for #30 tablets sent to her pharmacy.  - HYDROcodone-acetaminophen (NORCO/VICODIN) 5-325 MG tablet; Take 1 tablet by mouth every 6 (six) hours as needed for moderate pain.  Dispense: 30 tablet; Refill: 0  5. Shortness of breath Likely due to chronic heart failure. Advised her to discuss this with her cardiologist next week at appointment with him.  6. OSA (obstructive sleep apnea) Continue to use CPAP every night. Regular visits with Dr. Devona Konig for CPAP maintenance.   7. Dysuria - UA/M w/rflx Culture, Routine   General Counseling: Diana Bernard verbalizes understanding of the findings of todays visit and agrees with plan of treatment. I  have discussed any further diagnostic evaluation that may be needed or ordered today. We also reviewed her medications today. she has been encouraged to call the office with any questions or concerns that should arise related to todays visit.    Counseling:  Cardiac risk factor modification:  1. Control blood pressure. 2. Exercise as prescribed. 3. Follow low sodium, low fat diet. and low fat and low cholestrol diet. 4. Take ASA 81mg  once a day. 5. Restricted calories diet to lose weight.  This patient was seen by Leretha Pol FNP Collaboration with Dr Lavera Guise as a part of collaborative care agreement  Orders Placed This Encounter  Procedures  . Microscopic Examination  . Urine Culture, Reflex  . UA/M w/rflx Culture, Routine    Meds ordered this encounter  Medications  . HYDROcodone-acetaminophen (NORCO/VICODIN) 5-325 MG tablet    Sig: Take 1 tablet by mouth every 6 (six) hours as needed for moderate pain.    Dispense:  30 tablet    Refill:  0    Order Specific Question:   Supervising Provider    Answer:   Lavera Guise [[id number]]    Time spent: [place], MD  Internal Medicine

## 2018-03-19 LAB — T4, FREE: Free T4: 0.83 ng/dL (ref 0.82–1.77)

## 2018-03-19 LAB — IRON AND TIBC
Iron Saturation: 23 % (ref 15–55)
Iron: 64 ug/dL (ref 27–139)
TIBC: 283 ug/dL (ref 250–450)
UIBC: 219 ug/dL (ref 118–369)

## 2018-03-19 LAB — COMPREHENSIVE METABOLIC PANEL
A/G RATIO: 1.6 (ref 1.2–2.2)
ALT: 15 IU/L (ref 0–32)
AST: 19 IU/L (ref 0–40)
Albumin: 4.2 g/dL (ref 3.5–4.8)
Alkaline Phosphatase: 73 IU/L (ref 39–117)
BILIRUBIN TOTAL: 0.2 mg/dL (ref 0.0–1.2)
BUN/Creatinine Ratio: 21 (ref 12–28)
BUN: 19 mg/dL (ref 8–27)
CALCIUM: 9.5 mg/dL (ref 8.7–10.3)
CHLORIDE: 103 mmol/L (ref 96–106)
CO2: 22 mmol/L (ref 20–29)
Creatinine, Ser: 0.89 mg/dL (ref 0.57–1.00)
GFR, EST AFRICAN AMERICAN: 76 mL/min/{1.73_m2} (ref >59)
GFR, EST NON AFRICAN AMERICAN: 66 mL/min/{1.73_m2} (ref >59)
GLOBULIN, TOTAL: 2.7 g/dL (ref 1.5–4.5)
Glucose: 75 mg/dL (ref 65–99)
POTASSIUM: 4.1 mmol/L (ref 3.5–5.2)
SODIUM: 139 mmol/L (ref 134–144)
TOTAL PROTEIN: 6.9 g/dL (ref 6.0–8.5)

## 2018-03-19 LAB — CBC
HEMOGLOBIN: 11.8 g/dL (ref 11.1–15.9)
Hematocrit: 34.5 % (ref 34.0–46.6)
MCH: 30.6 pg (ref 26.6–33.0)
MCHC: 34.2 g/dL (ref 31.5–35.7)
MCV: 90 fL (ref 79–97)
PLATELETS: 160 10*3/uL (ref 150–450)
RBC: 3.85 x10E6/uL (ref 3.77–5.28)
RDW: 13.9 % (ref 12.3–15.4)
WBC: 4.2 10*3/uL (ref 3.4–10.8)

## 2018-03-19 LAB — B12 AND FOLATE PANEL
Folate: 11.6 ng/mL (ref >3.0)
VITAMIN B 12: 300 pg/mL (ref 232–1245)

## 2018-03-19 LAB — VITAMIN D 25 HYDROXY (VIT D DEFICIENCY, FRACTURES): VIT D 25 HYDROXY: 29.5 ng/mL — AB (ref 30.0–100.0)

## 2018-03-19 LAB — FERRITIN: FERRITIN: 86 ng/mL (ref 15–150)

## 2018-03-19 LAB — TSH: TSH: 2.36 u[IU]/mL (ref 0.450–4.500)

## 2018-03-19 LAB — T3: T3, Total: 139 ng/dL (ref 71–180)

## 2018-03-20 DIAGNOSIS — R300 Dysuria: Secondary | ICD-10-CM | POA: Insufficient documentation

## 2018-03-20 DIAGNOSIS — R5383 Other fatigue: Secondary | ICD-10-CM | POA: Insufficient documentation

## 2018-03-20 LAB — UA/M W/RFLX CULTURE, ROUTINE
BILIRUBIN UA: NEGATIVE
GLUCOSE, UA: NEGATIVE
Ketones, UA: NEGATIVE
Nitrite, UA: NEGATIVE
PROTEIN UA: NEGATIVE
RBC UA: NEGATIVE
SPEC GRAV UA: 1.023 (ref 1.005–1.030)
Urobilinogen, Ur: 0.2 mg/dL (ref 0.2–1.0)
pH, UA: 5 (ref 5.0–7.5)

## 2018-03-20 LAB — URINE CULTURE, REFLEX

## 2018-03-20 LAB — MICROSCOPIC EXAMINATION
Bacteria, UA: NONE SEEN
Casts: NONE SEEN /lpf
Epithelial Cells (non renal): NONE SEEN /hpf (ref 0–10)

## 2018-03-24 ENCOUNTER — Ambulatory Visit: Payer: Self-pay | Admitting: Internal Medicine

## 2018-03-26 DIAGNOSIS — I509 Heart failure, unspecified: Secondary | ICD-10-CM | POA: Diagnosis not present

## 2018-03-31 ENCOUNTER — Telehealth: Payer: Self-pay

## 2018-03-31 NOTE — Telephone Encounter (Signed)
Informed pt of her lab results

## 2018-03-31 NOTE — Telephone Encounter (Signed)
-----   Message from Ronnell Freshwater, NP sent at 03/31/2018  8:41 AM EST ----- Please let the patient know that labs looked great. Thanks.

## 2018-04-01 DIAGNOSIS — G4733 Obstructive sleep apnea (adult) (pediatric): Secondary | ICD-10-CM | POA: Diagnosis not present

## 2018-04-03 ENCOUNTER — Encounter: Payer: Self-pay | Admitting: Internal Medicine

## 2018-04-03 ENCOUNTER — Ambulatory Visit: Payer: Medicare HMO | Admitting: Internal Medicine

## 2018-04-03 VITALS — BP 129/59 | HR 59 | Resp 16 | Ht 60.0 in | Wt 245.0 lb

## 2018-04-03 DIAGNOSIS — G4733 Obstructive sleep apnea (adult) (pediatric): Secondary | ICD-10-CM | POA: Diagnosis not present

## 2018-04-03 DIAGNOSIS — J449 Chronic obstructive pulmonary disease, unspecified: Secondary | ICD-10-CM

## 2018-04-03 DIAGNOSIS — I5022 Chronic systolic (congestive) heart failure: Secondary | ICD-10-CM

## 2018-04-03 DIAGNOSIS — Z9989 Dependence on other enabling machines and devices: Secondary | ICD-10-CM

## 2018-04-03 NOTE — Patient Instructions (Signed)

## 2018-04-03 NOTE — Progress Notes (Signed)
[facility] [place], [place] [address]  Pulmonary Sleep Medicine   Office Visit Note  Patient Name: Diana Bernard DOB: 1946/04/03 MRN [id number]  Date of Service: 04/03/2018  Complaints/HPI: Pt is here for follow up on OSA and copd.  She reports good compliance with her CPAP machine.  She states that her symptoms are relieved when wearing the mask regularly.  She reports she is changing her filters and lines as indicated.  And she cleaning her machine regularly.  She denies any chest pain, sinus pain or pressure, hemoptysis or other issues.  Patient COPD is stable on current treatment regimen.  ROS  General: (-) fever, (-) chills, (-) night sweats, (-) weakness Skin: (-) rashes, (-) itching,. Eyes: (-) visual changes, (-) redness, (-) itching. Nose and Sinuses: (-) nasal stuffiness or itchiness, (-) postnasal drip, (-) nosebleeds, (-) sinus trouble. Mouth and Throat: (-) sore throat, (-) hoarseness. Neck: (-) swollen glands, (-) enlarged thyroid, (-) neck pain. Respiratory: + cough, (-) bloody sputum, + shortness of breath, - wheezing. Cardiovascular: - ankle swelling, (-) chest pain. Lymphatic: (-) lymph node enlargement. Neurologic: (-) numbness, (-) tingling. Psychiatric: (-) anxiety, (-) depression   Current Medication: Outpatient Encounter Medications as of 04/03/2018  Medication Sig Note  . amLODipine (NORVASC) 10 MG tablet Take 1 tab po daily   . aspirin EC 325 MG tablet Take 325 mg by mouth. 06/16/2015: Takes 1 tablet everyday.   . cholecalciferol (VITAMIN D) 1000 units tablet Take 1,000 Units by mouth daily.   . CYANOCOBALAMIN IJ Inject as directed.   . diphenoxylate-atropine (LOMOTIL) 2.5-0.025 MG tablet Take 1 tablet by mouth 4 (four) times daily as needed for diarrhea or loose stools.   . docusate sodium (STOOL SOFTENER) 100 MG capsule Take 200 mg by mouth daily.  06/07/2015: Received from: [facility]  . esomeprazole (NEXIUM) 20 MG  capsule Take 20 mg by mouth daily at 12 noon.   [name] ESOMEPRAZOLE MAGNESIUM PO Take 40 mg by mouth.   [name] FLUoxetine (PROZAC) 40 MG capsule Take 1 capsule (40 mg total) by mouth daily.   . furosemide (LASIX) 20 MG tablet Take 1 tablet (20 mg total) by mouth daily.   [name] gabapentin (NEURONTIN) 300 MG capsule Take 2 cap twice a daily for neuropathy   . hydrochlorothiazide (HYDRODIURIL) 25 MG tablet Take 25 mg by mouth daily.   [name] HYDROcodone-acetaminophen (NORCO/VICODIN) 5-325 MG tablet Take 1 tablet by mouth every 6 (six) hours as needed for moderate pain.   [name] ibuprofen (ADVIL,MOTRIN) 800 MG tablet Take 800 mg by mouth. Reported on 06/22/2015 06/16/2015: Can take up to 3 tablets per day as needed.   [name] ipratropium-albuterol (DUONEB) 0.5-2.5 (3) MG/3ML SOLN Take 3 mLs by nebulization every 4 (four) hours as needed.   . Lidocaine HCl 3 % GEL Reported on 06/22/2015 06/07/2015: Received from: [facility]  . lisinopril (PRINIVIL,ZESTRIL) 10 MG tablet Take 1 tablet (10 mg total) by mouth daily. (Patient taking differently: Take 20 mg by mouth daily. )   . lisinopril (PRINIVIL,ZESTRIL) 20 MG tablet Take 1 tablet by mouth.   . Magnesium 250 MG TABS Take by mouth. 06/22/2015: Reports taking once a day  . metoprolol succinate (TOPROL-XL) 25 MG 24 hr tablet Take 12.5 mg by mouth.   . ondansetron (ZOFRAN) 8 MG tablet Take 1 tablet (8 mg total) by mouth every 8 (eight) hours as needed for nausea or vomiting.   [name] oxybutynin (DITROPAN-XL) 5 MG 24  hr tablet Take 1 tablet (5 mg total) by mouth 2 (two) times daily.   . Potassium 99 MG TABS Take by mouth. 06/16/2015: Takes 2 tablets everyday.  [name] spironolactone (ALDACTONE) 25 MG tablet    . SPS 15 GM/60ML suspension    . sulfamethoxazole-trimethoprim (BACTRIM DS,SEPTRA DS) 800-160 MG tablet Take 1 tablet by mouth 2 (two) times daily.   . traMADol (ULTRAM) 50 MG tablet Take 1 tablet (50 mg total) by mouth 2 (two) times daily. One tab po bid prn for pain    No  facility-administered encounter medications on file as of 04/03/2018.     Surgical History: Past Surgical History:  Procedure Laterality Date  . ABCESS DRAINAGE  2016   Abdominal abcess due to diverticulitis  . caridoverter defibrillator  11/14/2017   ICD  . CATARACT EXTRACTION W/PHACO Left 10/26/2015   Procedure: CATARACT EXTRACTION PHACO AND INTRAOCULAR LENS PLACEMENT (IOC) LEFT EYE;  Surgeon: Leandrew Koyanagi, MD;  Location: [place];  Service: Ophthalmology;  Laterality: Left;  . CATARACT EXTRACTION W/PHACO Right 12/07/2015   Procedure: CATARACT EXTRACTION PHACO AND INTRAOCULAR LENS PLACEMENT (IOC);  Surgeon: Leandrew Koyanagi, MD;  Location: [place];  Service: Ophthalmology;  Laterality: Right;  . CHOLECYSTECTOMY    . FOOT SURGERY     Per patient, bilateral foot surgery.  [name] PELVIC FRACTURE SURGERY     Per patient for cancer.    Medical History: Past Medical History:  Diagnosis Date  . Arthritis   . Cancer ([place])   . Deep vein thrombosis (DVT) ([place]) 2015  . Depression   . GERD (gastroesophageal reflux disease)   . Hypertension   . Weakness of both legs     Family History: Family History  Problem Relation Age of Onset  . Depression Sister   . Heart disease Sister   . Early death Sister   . Depression Sister   . Depression Sister   . Depression Sister   . Depression Sister     Social History: Social History   Socioeconomic History  . Marital status: Married    Spouse name: Not on file  . Number of children: Not on file  . Years of education: Not on file  . Highest education level: Not on file  Occupational History  . Not on file  Social Needs  . Financial resource strain: Not on file  . Food insecurity:    Worry: Not on file    Inability: Not on file  . Transportation needs:    Medical: Not on file    Non-medical: Not on file  Tobacco Use  . Smoking status: Never Smoker  . Smokeless tobacco: Never Used  Substance and Sexual  Activity  . Alcohol use: No  . Drug use: No  . Sexual activity: Not Currently  Lifestyle  . Physical activity:    Days per week: Not on file    Minutes per session: Not on file  . Stress: Not on file  Relationships  . Social connections:    Talks on phone: Not on file    Gets together: Not on file    Attends religious service: Not on file    Active member of club or organization: Not on file    Attends meetings of clubs or organizations: Not on file    Relationship status: Not on file  . Intimate partner violence:    Fear of current or ex partner: Not on file    Emotionally abused: Not on file  Physically abused: Not on file    Forced sexual activity: Not on file  Other Topics Concern  . Not on file  Social History Narrative  . Not on file    Vital Signs: Blood pressure (!) 129/59, pulse (!) 59, resp. rate 16, height 5' (1.524 m), weight 245 lb (111.1 kg), SpO2 96 %.  Examination: General Appearance: The patient is well-developed, well-nourished, and in no distress. Skin: Gross inspection of skin unremarkable. Head: normocephalic, no gross deformities. Eyes: no gross deformities noted. ENT: ears appear grossly normal no exudates. Neck: Supple. No thyromegaly. No LAD. Respiratory: clear. Cardiovascular: Normal S1 and S2 without murmur or rub. Extremities: No cyanosis. pulses are equal. Neurologic: Alert and oriented. No involuntary movements.  LABS: Recent Results (from the past 2160 hour(s))  UA/M w/rflx Culture, Routine     Status: Abnormal   Collection Time: 03/18/18 12:29 PM  Result Value Ref Range   Specific Gravity, UA 1.023 1.005 - 1.030   pH, UA 5.0 5.0 - 7.5   Color, UA Yellow Yellow   Appearance Ur Clear Clear   Leukocytes, UA Trace (A) Negative   Protein, UA Negative Negative/Trace   Glucose, UA Negative Negative   Ketones, UA Negative Negative   RBC, UA Negative Negative   Bilirubin, UA Negative Negative   Urobilinogen, Ur 0.2 0.2 - 1.0 mg/dL    Nitrite, UA Negative Negative   Microscopic Examination See below:     Comment: Microscopic was indicated and was performed.   Urinalysis Reflex Comment     Comment: This specimen has reflexed to a Urine Culture.  Microscopic Examination     Status: Abnormal   Collection Time: 03/18/18 12:29 PM  Result Value Ref Range   WBC, UA 11-30 (A) 0 - 5 /hpf   RBC, UA 0-2 0 - 2 /hpf   Epithelial Cells (non renal) None seen 0 - 10 /hpf   Casts None seen None seen /lpf   Mucus, UA Present Not Estab.   Bacteria, UA None seen None seen/Few  Urine Culture, Reflex     Status: None   Collection Time: 03/18/18 12:29 PM  Result Value Ref Range   Urine Culture, Routine Final report    Organism ID, Bacteria Comment     Comment: Mixed urogenital flora Less than 10,000 colonies/mL   Comprehensive metabolic panel     Status: None   Collection Time: 03/18/18  2:04 PM  Result Value Ref Range   Glucose 75 65 - 99 mg/dL   BUN 19 8 - 27 mg/dL   Creatinine, Ser 0.89 0.57 - 1.00 mg/dL   GFR calc non Af Amer 66 >59 mL/min/1.73   GFR calc Af Amer 76 >59 mL/min/1.73   BUN/Creatinine Ratio 21 12 - 28   Sodium 139 134 - 144 mmol/L   Potassium 4.1 3.5 - 5.2 mmol/L   Chloride 103 96 - 106 mmol/L   CO2 22 20 - 29 mmol/L   Calcium 9.5 8.7 - 10.3 mg/dL   Total Protein 6.9 6.0 - 8.5 g/dL   Albumin 4.2 3.5 - 4.8 g/dL   Globulin, Total 2.7 1.5 - 4.5 g/dL   Albumin/Globulin Ratio 1.6 1.2 - 2.2   Bilirubin Total 0.2 0.0 - 1.2 mg/dL   Alkaline Phosphatase 73 39 - 117 IU/L   AST 19 0 - 40 IU/L   ALT 15 0 - 32 IU/L  CBC     Status: None   Collection Time: 03/18/18  2:04 PM  Result Value  Ref Range   WBC 4.2 3.4 - 10.8 x10E3/uL   RBC 3.85 3.77 - 5.28 x10E6/uL   Hemoglobin 11.8 11.1 - 15.9 g/dL   Hematocrit 34.5 34.0 - 46.6 %   MCV 90 79 - 97 fL   MCH 30.6 26.6 - 33.0 pg   MCHC 34.2 31.5 - 35.7 g/dL   RDW 13.9 12.3 - 15.4 %   Platelets 160 150 - 450 x10E3/uL  Iron and TIBC     Status: None   Collection Time:  03/18/18  2:04 PM  Result Value Ref Range   Total Iron Binding Capacity 283 250 - 450 ug/dL   UIBC 219 118 - 369 ug/dL   Iron 64 27 - 139 ug/dL   Iron Saturation 23 15 - 55 %  B12 and Folate Panel     Status: None   Collection Time: 03/18/18  2:04 PM  Result Value Ref Range   Vitamin B-12 300 232 - 1,245 pg/mL   Folate 11.6 >3.0 ng/mL    Comment: A serum folate concentration of less than 3.1 ng/mL is considered to represent clinical deficiency.   T4, free     Status: None   Collection Time: 03/18/18  2:04 PM  Result Value Ref Range   Free T4 0.83 0.82 - 1.77 ng/dL  TSH     Status: None   Collection Time: 03/18/18  2:04 PM  Result Value Ref Range   TSH 2.360 0.450 - 4.500 uIU/mL  VITAMIN D 25 Hydroxy (Vit-D Deficiency, Fractures)     Status: Abnormal   Collection Time: 03/18/18  2:04 PM  Result Value Ref Range   Vit D, 25-Hydroxy 29.5 (L) 30.0 - 100.0 ng/mL    Comment: Vitamin D deficiency has been defined by the [place] practice guideline as a level of serum 25-OH vitamin D less than 20 ng/mL (1,2). The Endocrine Society went on to further define vitamin D insufficiency as a level between 21 and 29 ng/mL (2). 1. IOM (Institute of Medicine). 2010. Dietary reference    intakes for calcium and D. [place]: The    [employer]. 2. Holick MF, [address] [place], Bischoff-Ferrari HA, et al.    Evaluation, treatment, and prevention of vitamin D    deficiency: an Endocrine Society clinical practice    guideline. JCEM. 2011 Jul; 96(7):1911-30.   T3     Status: None   Collection Time: 03/18/18  2:04 PM  Result Value Ref Range   T3, Total 139 71 - 180 ng/dL  Ferritin     Status: None   Collection Time: 03/18/18  2:04 PM  Result Value Ref Range   Ferritin 86 15 - 150 ng/mL    Radiology: Dg Chest 2 View  Result Date: 06/05/2017 CLINICAL DATA:  Cough and wheezing for several days EXAM: CHEST  2 VIEW COMPARISON:  01/13/2015 FINDINGS:  Cardiac shadow is mildly enlarged but stable. Previously seen right chest wall port has been removed in the interval. The lungs are well aerated bilaterally. No focal infiltrate or sizable effusion is seen. Changes of prior vertebral augmentation are noted. IMPRESSION: No acute abnormality noted. Electronically Signed   By: Inez Catalina M.D.   On: 06/05/2017 10:18    No results found.  No results found.    Assessment and Plan: Patient Active Problem List   Diagnosis Date Noted  . Other fatigue 03/20/2018  . Dysuria 03/20/2018  . Gastroenteritis, acute 01/26/2018  . Diarrhea 01/26/2018  .  Nausea 01/26/2018  . Chronic systolic heart failure ([place]) 01/26/2018  . Cough variant asthma 12/07/2017  . Need for vaccination against Streptococcus pneumoniae using pneumococcal conjugate vaccine 13 12/07/2017  . AICD lead displacement 11/20/2017  . Cardiac resynchronization therapy defibrillator (CRT-D) in place 11/14/2017  . Dilated cardiomyopathy ([place]) 09/04/2017  . Left bundle branch block 09/04/2017  . OSA (obstructive sleep apnea) 07/31/2017  . Abscess of female pelvis 05/07/2014  . Acute deep vein thrombosis (DVT) of femoral vein of left lower extremity ([place]) 03/29/2014  . Lumbago-sciatica due to displacement of lumbar intervertebral disc 12/28/2013  . Chronic bilateral low back pain with bilateral sciatica 12/28/2013  . Neuropathy due to chemotherapeutic drug ([place]) 11/02/2013  . Encounter for general adult medical examination with abnormal findings 06/01/2013  . Morbid obesity ([place]) 04/20/2013  . Essential hypertension 04/13/2013  . Malignant neoplasm of endometrium ([place]) 03/30/2013  . Shortness of breath 03/30/2013  . CMC arthritis, thumb, degenerative 01/02/2013   1. OSA on CPAP Stable, continue to wear CPAP as prescribed.  2. Obstructive chronic bronchitis without exacerbation ([place]) Stable at this time continue present management.  3. Morbid obesity ([place]) Obesity  Counseling: Risk Assessment: An assessment of behavioral risk factors was made today and includes lack of exercise sedentary lifestyle, lack of portion control and poor dietary habits.  Risk Modification Advice: She was counseled on portion control guidelines. Restricting daily caloric intake to. . The detrimental long term effects of obesity on her health and ongoing poor compliance was also discussed with the patient.  4. Chronic systolic heart failure ([place]) Stable at this time patient should continue to follow-up with cardiology as before.   General Counseling: I have discussed the findings of the evaluation and examination with Diana Bernard.  I have also discussed any further diagnostic evaluation thatmay be needed or ordered today. Diana Bernard verbalizes understanding of the findings of todays visit. We also reviewed her medications today and discussed drug interactions and side effects including but not limited excessive drowsiness and altered mental states. We also discussed that there is always a risk not just to her but also people around her. she has been encouraged to call the office with any questions or concerns that should arise related to todays visit.    Time spent: 25 This patient was seen by Orson Gear AGNP-C in Collaboration with Dr. Devona Konig as a part of collaborative care agreement.    I have personally obtained a history, examined the patient, evaluated laboratory and imaging results, formulated the assessment and plan and placed orders.    Allyne Gee, MD [facility] Pulmonary and Critical Care Sleep medicine

## 2018-04-07 ENCOUNTER — Telehealth: Payer: Self-pay

## 2018-04-07 ENCOUNTER — Other Ambulatory Visit: Payer: Self-pay | Admitting: Nurse Practitioner

## 2018-04-07 DIAGNOSIS — R300 Dysuria: Secondary | ICD-10-CM

## 2018-04-07 DIAGNOSIS — N390 Urinary tract infection, site not specified: Secondary | ICD-10-CM

## 2018-04-07 DIAGNOSIS — J440 Chronic obstructive pulmonary disease with acute lower respiratory infection: Secondary | ICD-10-CM

## 2018-04-07 DIAGNOSIS — J209 Acute bronchitis, unspecified: Secondary | ICD-10-CM

## 2018-04-07 MED ORDER — PHENAZOPYRIDINE HCL 200 MG PO TABS: 200.0000 mg | ORAL_TABLET | Freq: Three times a day (TID) | ORAL | 0 refills | Status: DC | PRN

## 2018-04-07 MED ORDER — SULFAMETHOXAZOLE-TRIMETHOPRIM 800-160 MG PO TABS: 1.0000 | ORAL_TABLET | Freq: Two times a day (BID) | ORAL | 0 refills | Status: DC

## 2018-04-07 NOTE — Telephone Encounter (Signed)
Burning when she uses the bathroom? Or burning in the back area?

## 2018-04-07 NOTE — Progress Notes (Signed)
Sent in prescription for bactrim DS bid for 10 days. Added pyridium 200mg  which may be taken up to three times daily as needed for bladder pain and burning. Both were sent to her pharmacy.

## 2018-04-07 NOTE — Telephone Encounter (Signed)
Sent in prescription for bactrim DS bid for 10 days. Added pyridium 200mg  which may be taken up to three times daily as needed for bladder pain and burning. Both were sent to her pharmacy.

## 2018-04-07 NOTE — Telephone Encounter (Signed)
Pt advised  We send macobid for uti if she not feeling need to been seen

## 2018-05-02 DIAGNOSIS — G4733 Obstructive sleep apnea (adult) (pediatric): Secondary | ICD-10-CM | POA: Diagnosis not present

## 2018-05-07 DIAGNOSIS — G4733 Obstructive sleep apnea (adult) (pediatric): Secondary | ICD-10-CM | POA: Diagnosis not present

## 2018-05-09 DIAGNOSIS — Z4502 Encounter for adjustment and management of automatic implantable cardiac defibrillator: Secondary | ICD-10-CM | POA: Diagnosis not present

## 2018-05-28 ENCOUNTER — Ambulatory Visit: Payer: Self-pay | Admitting: Adult Health

## 2018-05-29 ENCOUNTER — Encounter: Payer: Self-pay | Admitting: Nurse Practitioner

## 2018-05-29 ENCOUNTER — Ambulatory Visit (INDEPENDENT_AMBULATORY_CARE_PROVIDER_SITE_OTHER): Payer: Medicare HMO | Admitting: Nurse Practitioner

## 2018-05-29 VITALS — BP 151/65 | HR 61 | Temp 98.5°F | Resp 16 | Ht 60.0 in | Wt 243.6 lb

## 2018-05-29 DIAGNOSIS — G8929 Other chronic pain: Secondary | ICD-10-CM

## 2018-05-29 DIAGNOSIS — M5441 Lumbago with sciatica, right side: Secondary | ICD-10-CM | POA: Diagnosis not present

## 2018-05-29 DIAGNOSIS — M5127 Other intervertebral disc displacement, lumbosacral region: Secondary | ICD-10-CM

## 2018-05-29 DIAGNOSIS — I10 Essential (primary) hypertension: Secondary | ICD-10-CM

## 2018-05-29 DIAGNOSIS — K5732 Diverticulitis of large intestine without perforation or abscess without bleeding: Secondary | ICD-10-CM | POA: Diagnosis not present

## 2018-05-29 DIAGNOSIS — M5442 Lumbago with sciatica, left side: Secondary | ICD-10-CM | POA: Diagnosis not present

## 2018-05-29 MED ORDER — SULFAMETHOXAZOLE-TRIMETHOPRIM 800-160 MG PO TABS: 1.0000 | ORAL_TABLET | Freq: Two times a day (BID) | ORAL | 0 refills | Status: DC

## 2018-05-29 NOTE — Progress Notes (Signed)
[facility] [place], [place] [address]  Internal MEDICINE  Office Visit Note  Patient Name: Diana Bernard  [id number]  [id number]  Date of Service: 05/29/2018   Pt is here for a sick visit.  Chief Complaint  Patient presents with  . Diverticulitis    flare up started friday night, diarrhea and vomiting,  pt had ran a low grade fever but currently no fever, possible infection      The patient is having severe lower abdominal pain/cramping. Most severe in left lower quadrant of the abdomen. Did have several episodes of diarrhea. Also had some nausea and vomiting. She has significant history of diverticulitis with abscess formation. Antibiotic treatment has been successful.  Lower back pain is getting very severe. X-ray showed Lumbar spondylosis and degenerative change. There is MRI of cervical, thoracic, and lumbar spine from 2017 in care everywhere. They were done at [facility]. Back pain is getting more severe. Pain medication not helping and keeping her from doing normal activities.        Current Medication:  Outpatient Encounter Medications as of 05/29/2018  Medication Sig Note  . amLODipine (NORVASC) 10 MG tablet Take 1 tab po daily   . aspirin EC 325 MG tablet Take 325 mg by mouth. 06/16/2015: Takes 1 tablet everyday.   . cholecalciferol (VITAMIN D) 1000 units tablet Take 1,000 Units by mouth daily.   . CYANOCOBALAMIN IJ Inject as directed.   . diphenoxylate-atropine (LOMOTIL) 2.5-0.025 MG tablet Take 1 tablet by mouth 4 (four) times daily as needed for diarrhea or loose stools.   . docusate sodium (STOOL SOFTENER) 100 MG capsule Take 200 mg by mouth daily.  06/07/2015: Received from: [facility]  . esomeprazole (NEXIUM) 20 MG capsule Take 20 mg by mouth daily at 12 noon.   [name] ESOMEPRAZOLE MAGNESIUM PO Take 40 mg by mouth.   [name] FLUoxetine (PROZAC) 40 MG capsule Take 1 capsule (40 mg total) by mouth daily.   . furosemide (LASIX) 20 MG tablet  Take 1 tablet (20 mg total) by mouth daily.   [name] gabapentin (NEURONTIN) 300 MG capsule Take 2 cap twice a daily for neuropathy   . hydrochlorothiazide (HYDRODIURIL) 25 MG tablet Take 25 mg by mouth daily.   [name] HYDROcodone-acetaminophen (NORCO/VICODIN) 5-325 MG tablet Take 1 tablet by mouth every 6 (six) hours as needed for moderate pain.   [name] ibuprofen (ADVIL,MOTRIN) 800 MG tablet Take 800 mg by mouth. Reported on 06/22/2015 06/16/2015: Can take up to 3 tablets per day as needed.   [name] ipratropium-albuterol (DUONEB) 0.5-2.5 (3) MG/3ML SOLN Take 3 mLs by nebulization every 4 (four) hours as needed.   . Lidocaine HCl 3 % GEL Reported on 06/22/2015 06/07/2015: Received from: [facility]  . lisinopril (PRINIVIL,ZESTRIL) 10 MG tablet Take 1 tablet (10 mg total) by mouth daily. (Patient taking differently: Take 20 mg by mouth daily. )   . lisinopril (PRINIVIL,ZESTRIL) 20 MG tablet Take 1 tablet by mouth.   . Magnesium 250 MG TABS Take by mouth. 06/22/2015: Reports taking once a day  . metoprolol succinate (TOPROL-XL) 25 MG 24 hr tablet Take 12.5 mg by mouth.   . ondansetron (ZOFRAN) 8 MG tablet Take 1 tablet (8 mg total) by mouth every 8 (eight) hours as needed for nausea or vomiting.   [name] oxybutynin (DITROPAN-XL) 5 MG 24 hr tablet Take 1 tablet (5 mg total) by mouth 2 (two) times daily.   . phenazopyridine (PYRIDIUM) 200 MG tablet  Take 1 tablet (200 mg total) by mouth 3 (three) times daily as needed for pain.   [name] Potassium 99 MG TABS Take by mouth. 06/16/2015: Takes 2 tablets everyday.  [name] spironolactone (ALDACTONE) 25 MG tablet    . SPS 15 GM/60ML suspension    . sulfamethoxazole-trimethoprim (BACTRIM DS,SEPTRA DS) 800-160 MG tablet Take 1 tablet by mouth 2 (two) times daily.   . traMADol (ULTRAM) 50 MG tablet Take 1 tablet (50 mg total) by mouth 2 (two) times daily. One tab po bid prn for pain   . [DISCONTINUED] sulfamethoxazole-trimethoprim (BACTRIM DS,SEPTRA DS) 800-160 MG tablet Take 1 tablet by mouth 2  (two) times daily.    No facility-administered encounter medications on file as of 05/29/2018.       Medical History: Past Medical History:  Diagnosis Date  . Arthritis   . Cancer ([place])   . Deep vein thrombosis (DVT) ([place]) 2015  . Depression   . GERD (gastroesophageal reflux disease)   . Hypertension   . Weakness of both legs      Today's Vitals   05/29/18 1043  BP: (!) 151/65  Pulse: 61  Resp: 16  Temp: 98.5 F (36.9 C)  SpO2: 96%  Weight: 243 lb 9.6 oz (110.5 kg)  Height: 5' (1.524 m)   Body mass index is 47.57 kg/m.  Review of Systems  Constitutional: Positive for activity change and fatigue. Negative for appetite change.  HENT: Negative for congestion, postnasal drip and rhinorrhea.   Respiratory: Negative for cough and chest tightness.        Shortness of breath and fatigue with exertion. Slightly improved since her last visit.   Cardiovascular: Negative for chest pain, palpitations and leg swelling.  Gastrointestinal: Positive for nausea and vomiting. Negative for abdominal pain and diarrhea.       Multiple episodes of diarrhea and nausea.  Endocrine: Negative for cold intolerance, heat intolerance, polydipsia and polyuria.  Musculoskeletal: Positive for arthralgias, back pain, gait problem and myalgias.  Skin: Negative for rash.  Allergic/Immunologic: Negative.   Neurological: Positive for weakness. Negative for dizziness and headaches.  Hematological: Negative for adenopathy.  Psychiatric/Behavioral: Negative for decreased concentration and dysphoric mood.    Physical Exam Vitals signs and nursing note reviewed.  Constitutional:      General: She is not in acute distress.    Appearance: She is well-developed. She is ill-appearing. She is not diaphoretic.  HENT:     Head: Normocephalic and atraumatic.     Nose: Nose normal.     Mouth/Throat:     Pharynx: No oropharyngeal exudate.  Eyes:     Conjunctiva/sclera: Conjunctivae normal.     Pupils:  Pupils are equal, round, and reactive to light.  Neck:     Musculoskeletal: Normal range of motion and neck supple.     Thyroid: No thyromegaly.     Vascular: No carotid bruit or JVD.     Trachea: No tracheal deviation.  Cardiovascular:     Rate and Rhythm: Normal rate and regular rhythm.     Heart sounds: Normal heart sounds. No murmur. No friction rub. No gallop.   Pulmonary:     Effort: Pulmonary effort is normal. No respiratory distress.     Breath sounds: Normal breath sounds. No wheezing or rales.  Chest:     Chest wall: No tenderness.  Abdominal:     General: Bowel sounds are normal.     Palpations: Abdomen is soft.     Tenderness: There is abdominal  tenderness.     Comments: Moderate tenderness with palpation of the abdomen from the center over to the left lower quadrant.   Musculoskeletal: Normal range of motion.     Comments: Moderate and intermittent lower back pain which is worse with exertion. Pain is causing weakness in her legs and keeping her from participating in her normal activities.   Lymphadenopathy:     Cervical: No cervical adenopathy.  Skin:    General: Skin is warm and dry.  Neurological:     Mental Status: She is alert and oriented to person, place, and time.     Cranial Nerves: No cranial nerve deficit.  Psychiatric:        Mood and Affect: Mood is depressed.        Speech: Speech normal.        Behavior: Behavior normal.        Thought Content: Thought content normal.        Judgment: Judgment normal.   Assessment/Plan: 1. Diverticulitis of large intestine without perforation or abscess without bleeding Start bactrim DS bid for 14 days. Rest. Drink plenty of fluids. Refer to GI if symptoms worsen.  - sulfamethoxazole-trimethoprim (BACTRIM DS,SEPTRA DS) 800-160 MG tablet; Take 1 tablet by mouth 2 (two) times daily.  Dispense: 28 tablet; Refill: 0  2. Chronic bilateral low back pain with bilateral sciatica Pain medication no longer effective. Would  like to have referral to pain management clinic who assessed her previously.  - Ambulatory referral to Pain Clinic  3. Lumbago-sciatica due to displacement of lumbar intervertebral disc Pain medication no longer effective. Would like to have referral to pain management clinic who assessed her previously.   4. Essential hypertension Stable. Continue bp medication as prescribed   General Counseling: Juanita verbalizes understanding of the findings of todays visit and agrees with plan of treatment. I have discussed any further diagnostic evaluation that may be needed or ordered today. We also reviewed her medications today. she has been encouraged to call the office with any questions or concerns that should arise related to todays visit.    Counseling:  This patient was seen by Leretha Pol FNP Collaboration with Dr Lavera Guise as a part of collaborative care agreement  Orders Placed This Encounter  Procedures  . Ambulatory referral to Pain Clinic    Meds ordered this encounter  Medications  . sulfamethoxazole-trimethoprim (BACTRIM DS,SEPTRA DS) 800-160 MG tablet    Sig: Take 1 tablet by mouth 2 (two) times daily.    Dispense:  28 tablet    Refill:  0    Order Specific Question:   Supervising Provider    Answer:   Lavera Guise [[id number]]    Time spent: 25 Minutes

## 2018-06-02 DIAGNOSIS — G4733 Obstructive sleep apnea (adult) (pediatric): Secondary | ICD-10-CM | POA: Diagnosis not present

## 2018-06-10 ENCOUNTER — Ambulatory Visit: Payer: Medicare HMO | Admitting: Nurse Practitioner

## 2018-06-17 DIAGNOSIS — C541 Malignant neoplasm of endometrium: Secondary | ICD-10-CM | POA: Diagnosis not present

## 2018-06-20 ENCOUNTER — Ambulatory Visit: Payer: Self-pay | Admitting: Nurse Practitioner

## 2018-06-25 ENCOUNTER — Encounter: Payer: Self-pay | Admitting: Nurse Practitioner

## 2018-06-25 ENCOUNTER — Ambulatory Visit (INDEPENDENT_AMBULATORY_CARE_PROVIDER_SITE_OTHER): Payer: Medicare HMO | Admitting: Nurse Practitioner

## 2018-06-25 VITALS — BP 148/76 | HR 60 | Resp 16 | Ht 60.0 in | Wt 250.0 lb

## 2018-06-25 DIAGNOSIS — M5127 Other intervertebral disc displacement, lumbosacral region: Secondary | ICD-10-CM | POA: Diagnosis not present

## 2018-06-25 DIAGNOSIS — M5441 Lumbago with sciatica, right side: Secondary | ICD-10-CM | POA: Diagnosis not present

## 2018-06-25 DIAGNOSIS — K5732 Diverticulitis of large intestine without perforation or abscess without bleeding: Secondary | ICD-10-CM

## 2018-06-25 DIAGNOSIS — G8929 Other chronic pain: Secondary | ICD-10-CM | POA: Diagnosis not present

## 2018-06-25 DIAGNOSIS — M5442 Lumbago with sciatica, left side: Secondary | ICD-10-CM

## 2018-06-25 DIAGNOSIS — I739 Peripheral vascular disease, unspecified: Secondary | ICD-10-CM | POA: Diagnosis not present

## 2018-06-25 DIAGNOSIS — I10 Essential (primary) hypertension: Secondary | ICD-10-CM

## 2018-06-25 MED ORDER — HYDROCODONE-ACETAMINOPHEN 5-325 MG PO TABS: 1.0000 | ORAL_TABLET | Freq: Four times a day (QID) | ORAL | 0 refills | Status: DC | PRN

## 2018-06-25 NOTE — Progress Notes (Signed)
[facility] [place], [place] [address]  Internal MEDICINE  Office Visit Note  Patient Name: Diana Bernard  [id number]  [id number]  Date of Service: 06/25/2018  Chief Complaint  Patient presents with  . Medical Management of Chronic Issues    3 month follow up, pt concerned about her legs, they stay cold and hurt so bad from knees down , stays ice cold all the time, both legs, pt is suppose to start going to the pain clinic in [place] in March    The patient is c/o bilateral lower leg pain. Legs feels so cold that they hurt. Has not noted any color change or unusual swelling. Pulses are palpable in both feet. She does see GYN oncologist who told her he feels like this is likely related to severe DDD in her back. She is scheduled to see pain management provider on 3/3/ 2020. She is hoping that injections or procedure may help control the pain in back and in lower legs. Until then, she is asking if she can have new prescription for hydrocodone/APAP 5/325mg  which she takes only when absolutely needed. She does not drive or work after taking this medication and has been the only thing helpful to relieve pain.       Current Medication: Outpatient Encounter Medications as of 06/25/2018  Medication Sig Note  . amLODipine (NORVASC) 10 MG tablet Take 1 tab po daily   . aspirin EC 325 MG tablet Take 325 mg by mouth. 06/16/2015: Takes 1 tablet everyday.   . cholecalciferol (VITAMIN D) 1000 units tablet Take 1,000 Units by mouth daily.   . CYANOCOBALAMIN IJ Inject as directed.   . diphenoxylate-atropine (LOMOTIL) 2.5-0.025 MG tablet Take 1 tablet by mouth 4 (four) times daily as needed for diarrhea or loose stools.   . docusate sodium (STOOL SOFTENER) 100 MG capsule Take 200 mg by mouth daily.  06/07/2015: Received from: [facility]  . esomeprazole (NEXIUM) 20 MG capsule Take 20 mg by mouth daily at 12 noon.   [name] ESOMEPRAZOLE MAGNESIUM PO Take 40 mg by mouth.   [name]  FLUoxetine (PROZAC) 40 MG capsule Take 1 capsule (40 mg total) by mouth daily.   . furosemide (LASIX) 20 MG tablet Take 1 tablet (20 mg total) by mouth daily.   [name] gabapentin (NEURONTIN) 300 MG capsule Take 2 cap twice a daily for neuropathy   . hydrochlorothiazide (HYDRODIURIL) 25 MG tablet Take 25 mg by mouth daily.   [name] HYDROcodone-acetaminophen (NORCO/VICODIN) 5-325 MG tablet Take 1 tablet by mouth every 6 (six) hours as needed for moderate pain.   [name] ibuprofen (ADVIL,MOTRIN) 800 MG tablet Take 800 mg by mouth. Reported on 06/22/2015 06/16/2015: Can take up to 3 tablets per day as needed.   [name] ipratropium-albuterol (DUONEB) 0.5-2.5 (3) MG/3ML SOLN Take 3 mLs by nebulization every 4 (four) hours as needed.   . Lidocaine HCl 3 % GEL Reported on 06/22/2015 06/07/2015: Received from: [facility]  . lisinopril (PRINIVIL,ZESTRIL) 10 MG tablet Take 1 tablet (10 mg total) by mouth daily. (Patient taking differently: Take 20 mg by mouth daily. )   . lisinopril (PRINIVIL,ZESTRIL) 20 MG tablet Take 1 tablet by mouth.   . Magnesium 250 MG TABS Take by mouth. 06/22/2015: Reports taking once a day  . metoprolol succinate (TOPROL-XL) 25 MG 24 hr tablet Take 12.5 mg by mouth.   . ondansetron (ZOFRAN) 8 MG tablet Take 1 tablet (8 mg total) by mouth every 8 (  eight) hours as needed for nausea or vomiting.   [name] oxybutynin (DITROPAN-XL) 5 MG 24 hr tablet Take 1 tablet (5 mg total) by mouth 2 (two) times daily.   . phenazopyridine (PYRIDIUM) 200 MG tablet Take 1 tablet (200 mg total) by mouth 3 (three) times daily as needed for pain.   [name] Potassium 99 MG TABS Take by mouth. 06/16/2015: Takes 2 tablets everyday.  [name] spironolactone (ALDACTONE) 25 MG tablet    . SPS 15 GM/60ML suspension    . sulfamethoxazole-trimethoprim (BACTRIM DS,SEPTRA DS) 800-160 MG tablet Take 1 tablet by mouth 2 (two) times daily.   . traMADol (ULTRAM) 50 MG tablet Take 1 tablet (50 mg total) by mouth 2 (two) times daily. One tab po bid prn for pain    . [DISCONTINUED] HYDROcodone-acetaminophen (NORCO/VICODIN) 5-325 MG tablet Take 1 tablet by mouth every 6 (six) hours as needed for moderate pain.    No facility-administered encounter medications on file as of 06/25/2018.     Surgical History: Past Surgical History:  Procedure Laterality Date  . ABCESS DRAINAGE  2016   Abdominal abcess due to diverticulitis  . caridoverter defibrillator  11/14/2017   ICD  . CATARACT EXTRACTION W/PHACO Left 10/26/2015   Procedure: CATARACT EXTRACTION PHACO AND INTRAOCULAR LENS PLACEMENT (IOC) LEFT EYE;  Surgeon: Leandrew Koyanagi, MD;  Location: [facility];  Service: Ophthalmology;  Laterality: Left;  . CATARACT EXTRACTION W/PHACO Right 12/07/2015   Procedure: CATARACT EXTRACTION PHACO AND INTRAOCULAR LENS PLACEMENT (IOC);  Surgeon: Leandrew Koyanagi, MD;  Location: [facility];  Service: Ophthalmology;  Laterality: Right;  . CHOLECYSTECTOMY    . FOOT SURGERY     Per patient, bilateral foot surgery.  [name] PELVIC FRACTURE SURGERY     Per patient for cancer.    Medical History: Past Medical History:  Diagnosis Date  . Arthritis   . Cancer ([place])   . Deep vein thrombosis (DVT) ([place]) 2015  . Depression   . GERD (gastroesophageal reflux disease)   . Hypertension   . Weakness of both legs     Family History: Family History  Problem Relation Age of Onset  . Depression Sister   . Heart disease Sister   . Early death Sister   . Depression Sister   . Depression Sister   . Depression Sister   . Depression Sister     Social History   Socioeconomic History  . Marital status: Married    Spouse name: Not on file  . Number of children: Not on file  . Years of education: Not on file  . Highest education level: Not on file  Occupational History  . Not on file  Social Needs  . Financial resource strain: Not on file  . Food insecurity:    Worry: Not on file    Inability: Not on file  . Transportation needs:    Medical: Not on  file    Non-medical: Not on file  Tobacco Use  . Smoking status: Never Smoker  . Smokeless tobacco: Never Used  Substance and Sexual Activity  . Alcohol use: No  . Drug use: No  . Sexual activity: Not Currently  Lifestyle  . Physical activity:    Days per week: Not on file    Minutes per session: Not on file  . Stress: Not on file  Relationships  . Social connections:    Talks on phone: Not on file    Gets together: Not on file    Attends religious service:  Not on file    Active member of club or organization: Not on file    Attends meetings of clubs or organizations: Not on file    Relationship status: Not on file  . Intimate partner violence:    Fear of current or ex partner: Not on file    Emotionally abused: Not on file    Physically abused: Not on file    Forced sexual activity: Not on file  Other Topics Concern  . Not on file  Social History Narrative  . Not on file      Review of Systems  Constitutional: Positive for activity change and fatigue. Negative for chills and unexpected weight change.  HENT: Negative for congestion, postnasal drip, rhinorrhea, sneezing and sore throat.   Respiratory: Negative for cough, chest tightness, shortness of breath and wheezing.   Cardiovascular: Negative for chest pain and palpitations.       Bilateral leg pain with cold sensation in both feet, stretching up above the knees.   Gastrointestinal: Positive for abdominal pain. Negative for constipation, diarrhea, nausea and vomiting.  Endocrine: Negative for cold intolerance, heat intolerance, polydipsia and polyuria.  Musculoskeletal: Positive for arthralgias, back pain, gait problem and myalgias. Negative for joint swelling and neck pain.  Skin: Negative for rash.  Allergic/Immunologic: Negative for environmental allergies.  Neurological: Negative for dizziness, tremors, numbness and headaches.  Hematological: Negative for adenopathy. Does not bruise/bleed easily.   Psychiatric/Behavioral: Negative for behavioral problems (Depression), sleep disturbance and suicidal ideas. The patient is not nervous/anxious.     Today's Vitals   06/25/18 0935  BP: (!) 148/76  Pulse: 60  Resp: 16  SpO2: 94%  Weight: 250 lb (113.4 kg)  Height: 5' (1.524 m)   Body mass index is 48.82 kg/m.  Physical Exam Vitals signs and nursing note reviewed.  Constitutional:      General: She is not in acute distress.    Appearance: Normal appearance. She is well-developed. She is obese. She is not diaphoretic.  HENT:     Head: Normocephalic and atraumatic.     Mouth/Throat:     Pharynx: No oropharyngeal exudate.  Eyes:     Pupils: Pupils are equal, round, and reactive to light.  Neck:     Musculoskeletal: Normal range of motion and neck supple.     Thyroid: No thyromegaly.     Vascular: No JVD.     Trachea: No tracheal deviation.  Cardiovascular:     Rate and Rhythm: Normal rate and regular rhythm.     Heart sounds: Normal heart sounds. No murmur. No friction rub. No gallop.      Comments: Bilateral lower extremities have mild, non-pitting edema. They are cool to palpate. Pulses are 1+bilaterally.  Pulmonary:     Effort: Pulmonary effort is normal. No respiratory distress.     Breath sounds: Normal breath sounds. No wheezing or rales.  Chest:     Chest wall: No tenderness.  Abdominal:     General: Bowel sounds are normal.     Palpations: Abdomen is soft.  Musculoskeletal: Normal range of motion.     Comments: Moderate to severe lower back pain. This is especially severe with bending and twisting at the waist. No visible or palpable abnormalities at this time.   Lymphadenopathy:     Cervical: No cervical adenopathy.  Skin:    General: Skin is warm and dry.  Neurological:     Mental Status: She is alert and oriented to person, place, and time.  Cranial Nerves: No cranial nerve deficit.  Psychiatric:        Behavior: Behavior normal.        Thought  Content: Thought content normal.        Judgment: Judgment normal.   Assessment/Plan: 1. Peripheral arterial disease (HCC) Cold sensation in both lower legs with mild, non-pitting peripheral edema. Will get arterial duplex study of bilateral lower legs. Discuss findings at next visit.  - VAS [place] LOWER EXTREMITY ARTERIAL DUPLEX; Future  2. Chronic bilateral low back pain with bilateral sciatica New prescription for hydrocodone/APAP 5/325mg  tablets given. May be taken up to every 6 hours as needed for pain, however, patient will generally take one tablet every few days. She is aware of risks and side effects and agrees not to drive or work after taking this medication.  - HYDROcodone-acetaminophen (NORCO/VICODIN) 5-325 MG tablet; Take 1 tablet by mouth every 6 (six) hours as needed for moderate pain.  Dispense: 20 tablet; Refill: 0  3. Lumbago-sciatica due to displacement of lumbar intervertebral disc Scheduled to see pain management provider 07/01/2018  4. Essential hypertension Generally stable. Continue bp medication as prescribed  5. Diverticulitis of large intestine without perforation or abscess without bleeding Antibiotic treatment as previously prescribed   General Counseling: Madilynn verbalizes understanding of the findings of todays visit and agrees with plan of treatment. I have discussed any further diagnostic evaluation that may be needed or ordered today. We also reviewed her medications today. she has been encouraged to call the office with any questions or concerns that should arise related to todays visit.  Reviewed risks and possible side effects associated with taking opiates, benzodiazepines and other CNS depressants. Combination of these could cause dizziness and drowsiness. Advised patient not to drive or operate machinery when taking these medications, as patient's and other's life can be at risk and will have consequences. Patient verbalized understanding in this matter.  Dependence and abuse for these drugs will be monitored closely. A Controlled substance policy and procedure is on file which allows [facility] to order a urine drug screen test at any visit. Patient understands and agrees with the plan  This patient was seen by Leretha Pol FNP Collaboration with Dr Lavera Guise as a part of collaborative care agreement  Meds ordered this encounter  Medications  . HYDROcodone-acetaminophen (NORCO/VICODIN) 5-325 MG tablet    Sig: Take 1 tablet by mouth every 6 (six) hours as needed for moderate pain.    Dispense:  20 tablet    Refill:  0    Order Specific Question:   Supervising Provider    Answer:   Lavera Guise [[id number]]    Time spent: 55 Minutes      Dr Lavera Guise Internal medicine

## 2018-06-25 NOTE — Progress Notes (Signed)
Pt blood pressure was taken twice, no reading on the machine, 2nd reading manually; 148/76, pt is currently in some pain

## 2018-07-01 DIAGNOSIS — M47816 Spondylosis without myelopathy or radiculopathy, lumbar region: Secondary | ICD-10-CM | POA: Diagnosis not present

## 2018-07-01 DIAGNOSIS — G4733 Obstructive sleep apnea (adult) (pediatric): Secondary | ICD-10-CM | POA: Diagnosis not present

## 2018-07-01 DIAGNOSIS — M8580 Other specified disorders of bone density and structure, unspecified site: Secondary | ICD-10-CM | POA: Diagnosis not present

## 2018-07-01 DIAGNOSIS — G894 Chronic pain syndrome: Secondary | ICD-10-CM | POA: Diagnosis not present

## 2018-07-04 ENCOUNTER — Ambulatory Visit: Payer: Medicare HMO

## 2018-07-04 DIAGNOSIS — I739 Peripheral vascular disease, unspecified: Secondary | ICD-10-CM | POA: Diagnosis not present

## 2018-07-10 ENCOUNTER — Ambulatory Visit (INDEPENDENT_AMBULATORY_CARE_PROVIDER_SITE_OTHER): Payer: Medicare HMO | Admitting: Nurse Practitioner

## 2018-07-10 ENCOUNTER — Encounter: Payer: Self-pay | Admitting: Nurse Practitioner

## 2018-07-10 ENCOUNTER — Other Ambulatory Visit: Payer: Self-pay

## 2018-07-10 VITALS — BP 127/75 | HR 65 | Resp 16 | Ht 60.0 in | Wt 250.0 lb

## 2018-07-10 DIAGNOSIS — G8929 Other chronic pain: Secondary | ICD-10-CM

## 2018-07-10 DIAGNOSIS — M5442 Lumbago with sciatica, left side: Secondary | ICD-10-CM | POA: Diagnosis not present

## 2018-07-10 DIAGNOSIS — I739 Peripheral vascular disease, unspecified: Secondary | ICD-10-CM

## 2018-07-10 DIAGNOSIS — I10 Essential (primary) hypertension: Secondary | ICD-10-CM

## 2018-07-10 DIAGNOSIS — M5441 Lumbago with sciatica, right side: Secondary | ICD-10-CM

## 2018-07-10 NOTE — Progress Notes (Signed)
[facility] [place], [place] [address]  Internal MEDICINE  Office Visit Note  Patient Name: Diana Bernard  [id number]  [id number]  Date of Service: 08/06/2018  Chief Complaint  Patient presents with  . Medical Management of Chronic Issues    1wk follow up  . Labs Only    review ultrasound    The patient is c/o bilateral lower leg pain. Legs feels so cold that they hurt. Has not noted any color change or unusual swelling. Pulses are palpable in both feet. She does see GYN oncologist who told her he feels like this is likely related to severe DDD in her back. She is scheduled to see pain management provider on 3/3/ 2020. She is hoping that injections or procedure may help control the pain in back and in lower legs. Until then, she is asking if she can have new prescription for hydrocodone/APAP 5/325mg  which she takes only when absolutely needed. She does not drive or work after taking this medication and has been the only thing helpful to relieve pain. She has had ultrasound of bilateral lower extremities which showed no evidence of DVT, however, she does have some reduction in circulation on the left side.       Current Medication: Outpatient Encounter Medications as of 07/10/2018  Medication Sig Note  . amLODipine (NORVASC) 10 MG tablet Take 1 tab po daily   . aspirin EC 325 MG tablet Take 325 mg by mouth. 06/16/2015: Takes 1 tablet everyday.   . cholecalciferol (VITAMIN D) 1000 units tablet Take 1,000 Units by mouth daily.   . CYANOCOBALAMIN IJ Inject as directed.   . diphenoxylate-atropine (LOMOTIL) 2.5-0.025 MG tablet Take 1 tablet by mouth 4 (four) times daily as needed for diarrhea or loose stools.   . docusate sodium (STOOL SOFTENER) 100 MG capsule Take 200 mg by mouth daily.  06/07/2015: Received from: [facility]  . esomeprazole (NEXIUM) 20 MG capsule Take 20 mg by mouth daily at 12 noon.   [name] ESOMEPRAZOLE MAGNESIUM PO Take 40 mg by mouth.   .  furosemide (LASIX) 20 MG tablet Take 1 tablet (20 mg total) by mouth daily.   . hydrochlorothiazide (HYDRODIURIL) 25 MG tablet Take 25 mg by mouth daily.   [name] HYDROcodone-acetaminophen (NORCO/VICODIN) 5-325 MG tablet Take 1 tablet by mouth every 6 (six) hours as needed for moderate pain.   [name] ibuprofen (ADVIL,MOTRIN) 800 MG tablet Take 800 mg by mouth. Reported on 06/22/2015 06/16/2015: Can take up to 3 tablets per day as needed.   [name] ipratropium-albuterol (DUONEB) 0.5-2.5 (3) MG/3ML SOLN Take 3 mLs by nebulization every 4 (four) hours as needed.   . Lidocaine HCl 3 % GEL Reported on 06/22/2015 06/07/2015: Received from: [facility]  . lisinopril (PRINIVIL,ZESTRIL) 10 MG tablet Take 1 tablet (10 mg total) by mouth daily. (Patient taking differently: Take 20 mg by mouth daily. )   . lisinopril (PRINIVIL,ZESTRIL) 20 MG tablet Take 1 tablet by mouth.   . Magnesium 250 MG TABS Take by mouth. 06/22/2015: Reports taking once a day  . metoprolol succinate (TOPROL-XL) 25 MG 24 hr tablet Take 12.5 mg by mouth.   . ondansetron (ZOFRAN) 8 MG tablet Take 1 tablet (8 mg total) by mouth every 8 (eight) hours as needed for nausea or vomiting.   [name] oxybutynin (DITROPAN-XL) 5 MG 24 hr tablet Take 1 tablet (5 mg total) by mouth 2 (two) times daily.   . phenazopyridine (PYRIDIUM) 200 MG tablet  Take 1 tablet (200 mg total) by mouth 3 (three) times daily as needed for pain.   [name] Potassium 99 MG TABS Take by mouth. 06/16/2015: Takes 2 tablets everyday.  [name] spironolactone (ALDACTONE) 25 MG tablet    . SPS 15 GM/60ML suspension    . traMADol (ULTRAM) 50 MG tablet Take 1 tablet (50 mg total) by mouth 2 (two) times daily. One tab po bid prn for pain   . [DISCONTINUED] FLUoxetine (PROZAC) 40 MG capsule Take 1 capsule (40 mg total) by mouth daily.   . [DISCONTINUED] gabapentin (NEURONTIN) 300 MG capsule Take 2 cap twice a daily for neuropathy   . [DISCONTINUED] sulfamethoxazole-trimethoprim (BACTRIM DS,SEPTRA DS) 800-160 MG tablet  Take 1 tablet by mouth 2 (two) times daily. (Patient not taking: Reported on 07/10/2018)    No facility-administered encounter medications on file as of 07/10/2018.     Surgical History: Past Surgical History:  Procedure Laterality Date  . ABCESS DRAINAGE  2016   Abdominal abcess due to diverticulitis  . caridoverter defibrillator  11/14/2017   ICD  . CATARACT EXTRACTION W/PHACO Left 10/26/2015   Procedure: CATARACT EXTRACTION PHACO AND INTRAOCULAR LENS PLACEMENT (IOC) LEFT EYE;  Surgeon: Leandrew Koyanagi, MD;  Location: [place];  Service: Ophthalmology;  Laterality: Left;  . CATARACT EXTRACTION W/PHACO Right 12/07/2015   Procedure: CATARACT EXTRACTION PHACO AND INTRAOCULAR LENS PLACEMENT (IOC);  Surgeon: Leandrew Koyanagi, MD;  Location: [place];  Service: Ophthalmology;  Laterality: Right;  . CHOLECYSTECTOMY    . FOOT SURGERY     Per patient, bilateral foot surgery.  [name] PELVIC FRACTURE SURGERY     Per patient for cancer.    Medical History: Past Medical History:  Diagnosis Date  . Arthritis   . Cancer ([place])   . Deep vein thrombosis (DVT) ([place]) 2015  . Depression   . GERD (gastroesophageal reflux disease)   . Hypertension   . Weakness of both legs     Family History: Family History  Problem Relation Age of Onset  . Depression Sister   . Heart disease Sister   . Early death Sister   . Depression Sister   . Depression Sister   . Depression Sister   . Depression Sister     Social History   Socioeconomic History  . Marital status: Married    Spouse name: Not on file  . Number of children: Not on file  . Years of education: Not on file  . Highest education level: Not on file  Occupational History  . Not on file  Social Needs  . Financial resource strain: Not on file  . Food insecurity:    Worry: Not on file    Inability: Not on file  . Transportation needs:    Medical: Not on file    Non-medical: Not on file  Tobacco Use  . Smoking  status: Never Smoker  . Smokeless tobacco: Never Used  Substance and Sexual Activity  . Alcohol use: No  . Drug use: No  . Sexual activity: Not Currently  Lifestyle  . Physical activity:    Days per week: Not on file    Minutes per session: Not on file  . Stress: Not on file  Relationships  . Social connections:    Talks on phone: Not on file    Gets together: Not on file    Attends religious service: Not on file    Active member of club or organization: Not on file    Attends  meetings of clubs or organizations: Not on file    Relationship status: Not on file  . Intimate partner violence:    Fear of current or ex partner: Not on file    Emotionally abused: Not on file    Physically abused: Not on file    Forced sexual activity: Not on file  Other Topics Concern  . Not on file  Social History Narrative  . Not on file      Review of Systems  Constitutional: Positive for activity change and fatigue. Negative for chills and unexpected weight change.  HENT: Negative for congestion, postnasal drip, rhinorrhea, sneezing and sore throat.   Respiratory: Negative for cough, chest tightness, shortness of breath and wheezing.   Cardiovascular: Negative for chest pain and palpitations.       Bilateral leg pain with cold sensation in both feet, stretching up above the knees.   Gastrointestinal: Positive for abdominal pain. Negative for constipation, diarrhea, nausea and vomiting.  Endocrine: Negative for cold intolerance, heat intolerance, polydipsia and polyuria.  Musculoskeletal: Positive for arthralgias, back pain, gait problem and myalgias. Negative for joint swelling and neck pain.  Skin: Negative for rash.  Allergic/Immunologic: Negative for environmental allergies.  Neurological: Negative for dizziness, tremors, numbness and headaches.  Hematological: Negative for adenopathy. Does not bruise/bleed easily.  Psychiatric/Behavioral: Negative for behavioral problems (Depression),  sleep disturbance and suicidal ideas. The patient is not nervous/anxious.     Today's Vitals   07/10/18 1209  BP: 127/75  Pulse: 65  Resp: 16  SpO2: 94%  Weight: 250 lb (113.4 kg)  Height: 5' (1.524 m)   Body mass index is 48.82 kg/m.  Physical Exam Vitals signs and nursing note reviewed.  Constitutional:      General: She is not in acute distress.    Appearance: Normal appearance. She is well-developed. She is obese. She is not diaphoretic.  HENT:     Head: Normocephalic and atraumatic.     Mouth/Throat:     Pharynx: No oropharyngeal exudate.  Eyes:     Pupils: Pupils are equal, round, and reactive to light.  Neck:     Musculoskeletal: Normal range of motion and neck supple.     Thyroid: No thyromegaly.     Vascular: No JVD.     Trachea: No tracheal deviation.  Cardiovascular:     Rate and Rhythm: Normal rate and regular rhythm.     Heart sounds: Normal heart sounds. No murmur. No friction rub. No gallop.      Comments: Bilateral lower extremities have mild, non-pitting edema. They are cool to palpate. Pulses are 1+bilaterally.  Pulmonary:     Effort: Pulmonary effort is normal. No respiratory distress.     Breath sounds: Normal breath sounds. No wheezing or rales.  Chest:     Chest wall: No tenderness.  Abdominal:     General: Bowel sounds are normal.     Palpations: Abdomen is soft.  Musculoskeletal: Normal range of motion.     Comments: Moderate to severe lower back pain. This is especially severe with bending and twisting at the waist. No visible or palpable abnormalities at this time.   Lymphadenopathy:     Cervical: No cervical adenopathy.  Skin:    General: Skin is warm and dry.  Neurological:     Mental Status: She is alert and oriented to person, place, and time.     Cranial Nerves: No cranial nerve deficit.  Psychiatric:        Behavior:  Behavior normal.        Thought Content: Thought content normal.        Judgment: Judgment normal.     Assessment/Plan: 1. Peripheral arterial disease ([place]) reviewed results of arterial duplex study. No stenosis in the arteries of bilateral lower extremities, however, there is abnormal flow in left lower leg. Will refer to vascular surgery for further evaluation.  - Ambulatory referral to Vascular Surgery  2. Essential hypertension Generally stable. Continue bp medication as prescribed   3. Chronic bilateral low back pain with bilateral sciatica Continue regular visits with neurosurgery/pain management as scheduled.   General Counseling: Callie verbalizes understanding of the findings of todays visit and agrees with plan of treatment. I have discussed any further diagnostic evaluation that may be needed or ordered today. We also reviewed her medications today. she has been encouraged to call the office with any questions or concerns that should arise related to todays visit.   This patient was seen by Leretha Pol FNP Collaboration with Dr Lavera Guise as a part of collaborative care agreement  Orders Placed This Encounter  Procedures  . Ambulatory referral to Vascular Surgery     Time spent: 25 Minutes      Dr Lavera Guise Internal medicine

## 2018-07-17 ENCOUNTER — Other Ambulatory Visit: Payer: Self-pay

## 2018-07-17 MED ORDER — FLUOXETINE HCL 40 MG PO CAPS: 40.0000 mg | ORAL_CAPSULE | Freq: Every day | ORAL | 1 refills | Status: DC

## 2018-07-17 MED ORDER — GABAPENTIN 300 MG PO CAPS: ORAL_CAPSULE | ORAL | 1 refills | Status: DC

## 2018-07-21 ENCOUNTER — Telehealth: Payer: Self-pay

## 2018-07-21 ENCOUNTER — Encounter (INDEPENDENT_AMBULATORY_CARE_PROVIDER_SITE_OTHER): Payer: Self-pay | Admitting: Vascular Surgery

## 2018-07-21 ENCOUNTER — Other Ambulatory Visit: Payer: Self-pay | Admitting: Nurse Practitioner

## 2018-07-21 DIAGNOSIS — J0140 Acute pansinusitis, unspecified: Secondary | ICD-10-CM

## 2018-07-21 MED ORDER — CEFUROXIME AXETIL 500 MG PO TABS: 500.0000 mg | ORAL_TABLET | Freq: Two times a day (BID) | ORAL | 0 refills | Status: DC

## 2018-07-21 NOTE — Telephone Encounter (Signed)
Pt advised we send antibiotic

## 2018-07-21 NOTE — Progress Notes (Signed)
Patient having symptoms of sinus infection. Cough, congestion, no fever, or body aches. Sent prescription for ceftin 500mg  bid for 10 days to [place]

## 2018-07-21 NOTE — Telephone Encounter (Signed)
Patient having symptoms of sinus infection. Cough, congestion, no fever, or body aches. Sent prescription for ceftin 500mg  bid for 10 days to [place]

## 2018-07-24 ENCOUNTER — Other Ambulatory Visit: Payer: Self-pay | Admitting: Nurse Practitioner

## 2018-07-24 MED ORDER — FLUOXETINE HCL 40 MG PO CAPS: 40.0000 mg | ORAL_CAPSULE | Freq: Every day | ORAL | 2 refills | Status: DC

## 2018-07-24 MED ORDER — GABAPENTIN 300 MG PO CAPS: ORAL_CAPSULE | ORAL | 2 refills | Status: DC

## 2018-07-30 ENCOUNTER — Other Ambulatory Visit: Payer: Self-pay

## 2018-07-30 ENCOUNTER — Ambulatory Visit (INDEPENDENT_AMBULATORY_CARE_PROVIDER_SITE_OTHER): Payer: Medicare HMO | Admitting: Internal Medicine

## 2018-07-30 ENCOUNTER — Encounter: Payer: Self-pay | Admitting: Internal Medicine

## 2018-07-30 DIAGNOSIS — C541 Malignant neoplasm of endometrium: Secondary | ICD-10-CM | POA: Diagnosis not present

## 2018-07-30 DIAGNOSIS — I10 Essential (primary) hypertension: Secondary | ICD-10-CM | POA: Diagnosis not present

## 2018-07-30 DIAGNOSIS — J45909 Unspecified asthma, uncomplicated: Secondary | ICD-10-CM | POA: Diagnosis not present

## 2018-07-30 DIAGNOSIS — J209 Acute bronchitis, unspecified: Secondary | ICD-10-CM

## 2018-07-30 NOTE — Progress Notes (Signed)
[facility] [place], [place] [address]  Internal MEDICINE  Telephone Visit  Patient Name: Diana Bernard  [id number]  [id number]  Date of Service: 07/30/2018  I connected with the patient at 1133 by telephone and verified the patients identity using two identifiers.   I discussed the limitations, risks, security and privacy concerns of performing an evaluation and management service by telephone and the availability of in person appointments. I also discussed with the patient that there may be a patient responsible charge related to the service.  The patient expressed understanding and agrees to proceed.    Chief Complaint  Patient presents with  . Cough    spoke with pt on telephone call   . Sinusitis    no bodyaches,no fever     Cough  This is a chronic problem. The problem has been waxing and waning. The cough is productive of sputum (White mucus ). Associated symptoms include shortness of breath. Pertinent negatives include no fever. Associated symptoms comments: Chest feels congested, no fever or chills .  Sinusitis  Associated symptoms include congestion, coughing and shortness of breath.   Current Medication: Outpatient Encounter Medications as of 07/30/2018  Medication Sig Note  . amLODipine (NORVASC) 10 MG tablet Take 1 tab po daily   . aspirin EC 325 MG tablet Take 325 mg by mouth. 06/16/2015: Takes 1 tablet everyday.   . cholecalciferol (VITAMIN D) 1000 units tablet Take 1,000 Units by mouth daily.   . CYANOCOBALAMIN IJ Inject as directed.   . diphenoxylate-atropine (LOMOTIL) 2.5-0.025 MG tablet Take 1 tablet by mouth 4 (four) times daily as needed for diarrhea or loose stools.   . docusate sodium (STOOL SOFTENER) 100 MG capsule Take 200 mg by mouth daily.  06/07/2015: Received from: [facility]  . esomeprazole (NEXIUM) 20 MG capsule Take 20 mg by mouth daily at 12 noon.   [name] ESOMEPRAZOLE MAGNESIUM PO Take 40 mg by mouth.   [name] FLUoxetine  (PROZAC) 40 MG capsule Take 1 capsule (40 mg total) by mouth daily.   . furosemide (LASIX) 20 MG tablet Take 1 tablet (20 mg total) by mouth daily.   [name] gabapentin (NEURONTIN) 300 MG capsule Take 2 cap twice a daily for neuropathy   . hydrochlorothiazide (HYDRODIURIL) 25 MG tablet Take 25 mg by mouth daily.   [name] HYDROcodone-acetaminophen (NORCO/VICODIN) 5-325 MG tablet Take 1 tablet by mouth every 6 (six) hours as needed for moderate pain.   [name] ibuprofen (ADVIL,MOTRIN) 800 MG tablet Take 800 mg by mouth. Reported on 06/22/2015 06/16/2015: Can take up to 3 tablets per day as needed.   [name] ipratropium-albuterol (DUONEB) 0.5-2.5 (3) MG/3ML SOLN Take 3 mLs by nebulization every 4 (four) hours as needed.   . Lidocaine HCl 3 % GEL Reported on 06/22/2015 06/07/2015: Received from: [facility]  . lisinopril (PRINIVIL,ZESTRIL) 10 MG tablet Take 1 tablet (10 mg total) by mouth daily. (Patient taking differently: Take 20 mg by mouth daily. )   . lisinopril (PRINIVIL,ZESTRIL) 20 MG tablet Take 1 tablet by mouth.   . Magnesium 250 MG TABS Take by mouth. 06/22/2015: Reports taking once a day  . metoprolol succinate (TOPROL-XL) 25 MG 24 hr tablet Take 12.5 mg by mouth.   . ondansetron (ZOFRAN) 8 MG tablet Take 1 tablet (8 mg total) by mouth every 8 (eight) hours as needed for nausea or vomiting.   [name] oxybutynin (DITROPAN-XL) 5 MG 24 hr tablet Take 1 tablet (5 mg total) by  mouth 2 (two) times daily.   . phenazopyridine (PYRIDIUM) 200 MG tablet Take 1 tablet (200 mg total) by mouth 3 (three) times daily as needed for pain.   [name] Potassium 99 MG TABS Take by mouth. 06/16/2015: Takes 2 tablets everyday.  [name] spironolactone (ALDACTONE) 25 MG tablet    . SPS 15 GM/60ML suspension    . traMADol (ULTRAM) 50 MG tablet Take 1 tablet (50 mg total) by mouth 2 (two) times daily. One tab po bid prn for pain   . cefUROXime (CEFTIN) 500 MG tablet Take 1 tablet (500 mg total) by mouth 2 (two) times daily with a meal. (Patient not taking:  Reported on 07/30/2018)    No facility-administered encounter medications on file as of 07/30/2018.     Surgical History: Past Surgical History:  Procedure Laterality Date  . ABCESS DRAINAGE  2016   Abdominal abcess due to diverticulitis  . caridoverter defibrillator  11/14/2017   ICD  . CATARACT EXTRACTION W/PHACO Left 10/26/2015   Procedure: CATARACT EXTRACTION PHACO AND INTRAOCULAR LENS PLACEMENT (IOC) LEFT EYE;  Surgeon: Leandrew Koyanagi, MD;  Location: [place];  Service: Ophthalmology;  Laterality: Left;  . CATARACT EXTRACTION W/PHACO Right 12/07/2015   Procedure: CATARACT EXTRACTION PHACO AND INTRAOCULAR LENS PLACEMENT (IOC);  Surgeon: Leandrew Koyanagi, MD;  Location: [place];  Service: Ophthalmology;  Laterality: Right;  . CHOLECYSTECTOMY    . FOOT SURGERY     Per patient, bilateral foot surgery.  [name] PELVIC FRACTURE SURGERY     Per patient for cancer.    Medical History: Past Medical History:  Diagnosis Date  . Arthritis   . Cancer ([place])   . Deep vein thrombosis (DVT) ([place]) 2015  . Depression   . GERD (gastroesophageal reflux disease)   . Hypertension   . Weakness of both legs     Family History: Family History  Problem Relation Age of Onset  . Depression Sister   . Heart disease Sister   . Early death Sister   . Depression Sister   . Depression Sister   . Depression Sister   . Depression Sister     Social History   Socioeconomic History  . Marital status: Married    Spouse name: Not on file  . Number of children: Not on file  . Years of education: Not on file  . Highest education level: Not on file  Occupational History  . Not on file  Social Needs  . Financial resource strain: Not on file  . Food insecurity:    Worry: Not on file    Inability: Not on file  . Transportation needs:    Medical: Not on file    Non-medical: Not on file  Tobacco Use  . Smoking status: Never Smoker  . Smokeless tobacco: Never Used  Substance  and Sexual Activity  . Alcohol use: No  . Drug use: No  . Sexual activity: Not Currently  Lifestyle  . Physical activity:    Days per week: Not on file    Minutes per session: Not on file  . Stress: Not on file  Relationships  . Social connections:    Talks on phone: Not on file    Gets together: Not on file    Attends religious service: Not on file    Active member of club or organization: Not on file    Attends meetings of clubs or organizations: Not on file    Relationship status: Not on file  . Intimate  partner violence:    Fear of current or ex partner: Not on file    Emotionally abused: Not on file    Physically abused: Not on file    Forced sexual activity: Not on file  Other Topics Concern  . Not on file  Social History Narrative  . Not on file   Review of Systems  Constitutional: Positive for fatigue. Negative for fever.  HENT: Positive for congestion. Negative for nosebleeds.   Eyes: Negative for pain.  Respiratory: Positive for cough and shortness of breath.        Very congested   Cardiovascular: Positive for leg swelling.  Gastrointestinal: Negative for abdominal distention.  Genitourinary: Negative for genital sores.  Musculoskeletal: Positive for arthralgias.  Neurological: Positive for weakness and light-headedness.    Vital Signs: BP (!) 162/85   Pulse 66   Resp 16   Ht 5' (1.524 m)   Wt 240 lb (108.9 kg)   SpO2 98%   BMI 46.87 kg/m    Observation/Objective: Spoke over the phone, pt was able to complete her sentences. Coughing is very congested, chest felt tight with wheezing    Assessment/Plan: 1. Acute bronchitis with asthma Pt is instructed to use her nebulizer 3 x day. She will come to the office in am for possible steroid injection   2. Essential hypertension, benign Monitor BP   General Counseling: Lucillie verbalizes understanding of the findings of today's phone visit and agrees with plan of treatment. I have discussed any further  diagnostic evaluation that may be needed or ordered today. We also reviewed her medications today. she has been encouraged to call the office with any questions or concerns that should arise related to todays visit.   Time spent:15 Minutes   Dr Lavera Guise Internal medicine

## 2018-07-31 ENCOUNTER — Ambulatory Visit (INDEPENDENT_AMBULATORY_CARE_PROVIDER_SITE_OTHER): Payer: Medicare HMO | Admitting: Adult Health

## 2018-07-31 ENCOUNTER — Other Ambulatory Visit: Payer: Self-pay

## 2018-07-31 ENCOUNTER — Encounter: Payer: Self-pay | Admitting: Adult Health

## 2018-07-31 VITALS — BP 128/76 | HR 64 | Temp 98.4°F | Resp 16 | Ht 60.0 in | Wt 240.0 lb

## 2018-07-31 DIAGNOSIS — J40 Bronchitis, not specified as acute or chronic: Secondary | ICD-10-CM | POA: Diagnosis not present

## 2018-07-31 DIAGNOSIS — R0602 Shortness of breath: Secondary | ICD-10-CM

## 2018-07-31 MED ORDER — PREDNISONE 10 MG PO TABS: ORAL_TABLET | ORAL | 0 refills | Status: DC

## 2018-07-31 MED ORDER — METHYLPREDNISOLONE ACETATE 80 MG/ML IJ SUSP
80.0000 mg | Freq: Once | INTRAMUSCULAR | Status: AC
  Administered 2018-07-31: 80 mg via INTRAMUSCULAR

## 2018-07-31 MED ORDER — AZITHROMYCIN 250 MG PO TABS: ORAL_TABLET | ORAL | 0 refills | Status: DC

## 2018-07-31 NOTE — Progress Notes (Signed)
[facility] [place], [place] [address]  Internal MEDICINE  Office Visit Note  Patient Name: Diana Bernard  [id number]  [id number]  Date of Service: 09/09/2018  Chief Complaint  Patient presents with  . Bronchitis     HPI Pt is here for a sick visit. Pt reports two weeks of cough, chest congestion.  She has just completed a course of ceftin with some mild improvement.  She denies fever/chills. She denies any productivity with her cough. Pt concerned because she just can not seem to get better.       Current Medication:  Outpatient Encounter Medications as of 07/31/2018  Medication Sig Note  . aspirin EC 325 MG tablet Take 325 mg by mouth. 06/16/2015: Takes 1 tablet everyday.   [name] azithromycin (ZITHROMAX) 250 MG tablet Take as directed. (Patient not taking: Reported on 09/02/2018)   . cholecalciferol (VITAMIN D) 1000 units tablet Take 1,000 Units by mouth daily.   . CYANOCOBALAMIN IJ Inject as directed.   . diphenoxylate-atropine (LOMOTIL) 2.5-0.025 MG tablet Take 1 tablet by mouth 4 (four) times daily as needed for diarrhea or loose stools.   . docusate sodium (STOOL SOFTENER) 100 MG capsule Take 200 mg by mouth daily.  06/07/2015: Received from: [facility]  . esomeprazole (NEXIUM) 20 MG capsule Take 20 mg by mouth daily at 12 noon.   [name] ESOMEPRAZOLE MAGNESIUM PO Take 40 mg by mouth.   . furosemide (LASIX) 20 MG tablet Take 1 tablet (20 mg total) by mouth daily.   [name] gabapentin (NEURONTIN) 300 MG capsule Take 2 cap twice a daily for neuropathy   . hydrochlorothiazide (HYDRODIURIL) 25 MG tablet Take 25 mg by mouth daily.   [name] HYDROcodone-acetaminophen (NORCO/VICODIN) 5-325 MG tablet Take 1 tablet by mouth every 6 (six) hours as needed for moderate pain.   [name] ibuprofen (ADVIL,MOTRIN) 800 MG tablet Take 800 mg by mouth. Reported on 06/22/2015 06/16/2015: Can take up to 3 tablets per day as needed.   [name] ipratropium-albuterol (DUONEB) 0.5-2.5 (3) MG/3ML SOLN Take  3 mLs by nebulization every 4 (four) hours as needed.   . Lidocaine HCl 3 % GEL Reported on 06/22/2015 06/07/2015: Received from: [facility]  . lisinopril (PRINIVIL,ZESTRIL) 10 MG tablet Take 1 tablet (10 mg total) by mouth daily. (Patient taking differently: Take 20 mg by mouth daily. )   . lisinopril (PRINIVIL,ZESTRIL) 20 MG tablet Take 1 tablet by mouth.   . Magnesium 250 MG TABS Take by mouth. 06/22/2015: Reports taking once a day  . metoprolol succinate (TOPROL-XL) 25 MG 24 hr tablet Take 12.5 mg by mouth.   . ondansetron (ZOFRAN) 8 MG tablet Take 1 tablet (8 mg total) by mouth every 8 (eight) hours as needed for nausea or vomiting.   [name] oxybutynin (DITROPAN-XL) 5 MG 24 hr tablet Take 1 tablet (5 mg total) by mouth 2 (two) times daily.   . phenazopyridine (PYRIDIUM) 200 MG tablet Take 1 tablet (200 mg total) by mouth 3 (three) times daily as needed for pain.   [name] Potassium 99 MG TABS Take by mouth. 06/16/2015: Takes 2 tablets everyday.  . predniSONE (DELTASONE) 10 MG tablet Use per dose pack   . spironolactone (ALDACTONE) 25 MG tablet    . SPS 15 GM/60ML suspension    . traMADol (ULTRAM) 50 MG tablet Take 1 tablet (50 mg total) by mouth 2 (two) times daily. One tab po bid prn for pain   . [DISCONTINUED] amLODipine (NORVASC) 10  MG tablet Take 1 tab po daily   . [DISCONTINUED] cefUROXime (CEFTIN) 500 MG tablet Take 1 tablet (500 mg total) by mouth 2 (two) times daily with a meal. (Patient not taking: Reported on 07/30/2018)   . [DISCONTINUED] FLUoxetine (PROZAC) 40 MG capsule Take 1 capsule (40 mg total) by mouth daily. 09/02/2018: change to duloxetine.  . [EXPIRED] methylPREDNISolone acetate (DEPO-MEDROL) injection 80 mg     No facility-administered encounter medications on file as of 07/31/2018.       Medical History: Past Medical History:  Diagnosis Date  . Arthritis   . Cancer ([place])   . Deep vein thrombosis (DVT) ([place]) 2015  . Depression   . GERD (gastroesophageal reflux disease)   .  Hypertension   . Weakness of both legs      Vital Signs: BP 128/76   Pulse 64   Temp 98.4 F (36.9 C)   Resp 16   Ht 5' (1.524 m)   Wt 240 lb (108.9 kg)   SpO2 97%   BMI 46.87 kg/m    Review of Systems  Constitutional: Negative for chills, fatigue and unexpected weight change.  HENT: Negative for congestion, rhinorrhea, sneezing and sore throat.   Eyes: Negative for photophobia, pain and redness.  Respiratory: Negative for cough, chest tightness and shortness of breath.   Cardiovascular: Negative for chest pain and palpitations.  Gastrointestinal: Negative for abdominal pain, constipation, diarrhea, nausea and vomiting.  Endocrine: Negative.   Genitourinary: Negative for dysuria and frequency.  Musculoskeletal: Negative for arthralgias, back pain, joint swelling and neck pain.  Skin: Negative for rash.  Allergic/Immunologic: Negative.   Neurological: Negative for tremors and numbness.  Hematological: Negative for adenopathy. Does not bruise/bleed easily.  Psychiatric/Behavioral: Negative for behavioral problems and sleep disturbance. The patient is not nervous/anxious.     Physical Exam Vitals signs and nursing note reviewed.  Constitutional:      General: She is not in acute distress.    Appearance: She is well-developed. She is not diaphoretic.  HENT:     Head: Normocephalic and atraumatic.     Mouth/Throat:     Pharynx: No oropharyngeal exudate.  Eyes:     Pupils: Pupils are equal, round, and reactive to light.  Neck:     Musculoskeletal: Normal range of motion and neck supple.     Thyroid: No thyromegaly.     Vascular: No JVD.     Trachea: No tracheal deviation.  Cardiovascular:     Rate and Rhythm: Normal rate and regular rhythm.     Heart sounds: Normal heart sounds. No murmur. No friction rub. No gallop.   Pulmonary:     Effort: Pulmonary effort is normal. No respiratory distress.     Breath sounds: Wheezing present. No rales.  Chest:     Chest wall:  No tenderness.  Abdominal:     Palpations: Abdomen is soft.     Tenderness: There is no abdominal tenderness. There is no guarding.  Musculoskeletal: Normal range of motion.  Lymphadenopathy:     Cervical: No cervical adenopathy.  Skin:    General: Skin is warm and dry.  Neurological:     Mental Status: She is alert and oriented to person, place, and time.     Cranial Nerves: No cranial nerve deficit.  Psychiatric:        Behavior: Behavior normal.        Thought Content: Thought content normal.        Judgment: Judgment normal.  Assessment/Plan: 1. Bronchitis Continue to use nebulizer 3x a day for the next few days.  Use prednisone and azithromycin as discussed. Advised patient to take entire course of antibiotics as prescribed with food. Pt should return to clinic in 7-10 days if symptoms fail to improve or new symptoms develop.  - predniSONE (DELTASONE) 10 MG tablet; Use per dose pack  Dispense: 21 tablet; Refill: 0 - azithromycin (ZITHROMAX) 250 MG tablet; Take as directed.  Dispense: 6 tablet; Refill: 0  2. SOB (shortness of breath) FVC 1.8 which is 73% of predicted value FEV1 is 1.5 which is 80% of the pre-predicted value FEV1/FVC is 83% which is 109% of the pre-predicted value on today's spirometry. - Spirometry with Graph - methylPREDNISolone acetate (DEPO-MEDROL) injection 80 mg  General Counseling: Marrisa verbalizes understanding of the findings of todays visit and agrees with plan of treatment. I have discussed any further diagnostic evaluation that may be needed or ordered today. We also reviewed her medications today. she has been encouraged to call the office with any questions or concerns that should arise related to todays visit.   Orders Placed This Encounter  Procedures  . Spirometry with Graph    Meds ordered this encounter  Medications  . methylPREDNISolone acetate (DEPO-MEDROL) injection 80 mg  . predniSONE (DELTASONE) 10 MG tablet    Sig: Use per  dose pack    Dispense:  21 tablet    Refill:  0  . azithromycin (ZITHROMAX) 250 MG tablet    Sig: Take as directed.    Dispense:  6 tablet    Refill:  0    Time spent: 30 Minutes  This patient was seen by Orson Gear AGNP-C in Collaboration with Dr Lavera Guise as a part of collaborative care agreement.  Kendell Bane AGNP-C Internal Medicine

## 2018-08-01 DIAGNOSIS — G4733 Obstructive sleep apnea (adult) (pediatric): Secondary | ICD-10-CM | POA: Diagnosis not present

## 2018-08-05 ENCOUNTER — Telehealth: Payer: Self-pay | Admitting: Internal Medicine

## 2018-08-05 NOTE — Telephone Encounter (Signed)
Spoke to [place] this morning she states that she is feeling so much better, she states that she is on the last day of medication and prednisone and also using a inhaler and nebulizer. Advised patient to let us know if anything changes.

## 2018-08-05 NOTE — Telephone Encounter (Signed)
Thank you :)

## 2018-08-08 DIAGNOSIS — I429 Cardiomyopathy, unspecified: Secondary | ICD-10-CM | POA: Diagnosis not present

## 2018-08-08 DIAGNOSIS — Z4502 Encounter for adjustment and management of automatic implantable cardiac defibrillator: Secondary | ICD-10-CM | POA: Diagnosis not present

## 2018-08-25 DIAGNOSIS — M545 Low back pain: Secondary | ICD-10-CM | POA: Diagnosis not present

## 2018-08-25 DIAGNOSIS — G8929 Other chronic pain: Secondary | ICD-10-CM | POA: Diagnosis not present

## 2018-08-31 DIAGNOSIS — G4733 Obstructive sleep apnea (adult) (pediatric): Secondary | ICD-10-CM | POA: Diagnosis not present

## 2018-09-02 ENCOUNTER — Encounter: Payer: Self-pay | Admitting: Nurse Practitioner

## 2018-09-02 ENCOUNTER — Other Ambulatory Visit: Payer: Self-pay

## 2018-09-02 ENCOUNTER — Ambulatory Visit (INDEPENDENT_AMBULATORY_CARE_PROVIDER_SITE_OTHER): Payer: Medicare HMO | Admitting: Nurse Practitioner

## 2018-09-02 VITALS — BP 120/58 | HR 69 | Resp 16 | Ht 60.0 in | Wt 248.0 lb

## 2018-09-02 DIAGNOSIS — I10 Essential (primary) hypertension: Secondary | ICD-10-CM | POA: Diagnosis not present

## 2018-09-02 DIAGNOSIS — M5127 Other intervertebral disc displacement, lumbosacral region: Secondary | ICD-10-CM | POA: Diagnosis not present

## 2018-09-02 DIAGNOSIS — F321 Major depressive disorder, single episode, moderate: Secondary | ICD-10-CM

## 2018-09-02 MED ORDER — DULOXETINE HCL 30 MG PO CPEP: 30.0000 mg | ORAL_CAPSULE | Freq: Every day | ORAL | 1 refills | Status: DC

## 2018-09-02 NOTE — Progress Notes (Signed)
[facility] [place],  [id number]  Internal MEDICINE  Telephone Visit  Patient Name: Diana Bernard  [id number]  [id number]  Date of Service: 09/22/2018  I connected with the patient at 2:21pm by telephone and verified the patients identity using two identifiers.   I discussed the limitations, risks, security and privacy concerns of performing an evaluation and management service by telephone and the availability of in person appointments. I also discussed with the patient that there may be a patient responsible charge related to the service.  The patient expressed understanding and agrees to proceed.    Chief Complaint  Patient presents with  . Telephone Assessment  . Telephone Screen  . Depression  . Hypertension  . Medical Management of Chronic Issues    pain managment  like change her prozac to cymbalta  . Quality Metric Gaps    BMD     The patient has been contacted via telephone for follow up visit due to concerns for spread of novel coronavirus. States that she recently saw pain management provider. Was suggested she may want to try changing her fluoxetine to duloxetine. This change may help her to control chronic low back pain which radiates into her legs and hips and may also improve her energy levels and depression.       Current Medication: Outpatient Encounter Medications as of 09/02/2018  Medication Sig Note  . aspirin EC 325 MG tablet Take 325 mg by mouth. 06/16/2015: Takes 1 tablet everyday.   . cholecalciferol (VITAMIN D) 1000 units tablet Take 1,000 Units by mouth daily.   . CYANOCOBALAMIN IJ Inject as directed.   . diphenoxylate-atropine (LOMOTIL) 2.5-0.025 MG tablet Take 1 tablet by mouth 4 (four) times daily as needed for diarrhea or loose stools.   . docusate sodium (STOOL SOFTENER) 100 MG capsule Take 200 mg by mouth daily.  06/07/2015: Received from: [facility]  . esomeprazole (NEXIUM) 20 MG capsule Take 20 mg by mouth  daily at 12 noon.   [name] ESOMEPRAZOLE MAGNESIUM PO Take 40 mg by mouth.   . furosemide (LASIX) 20 MG tablet Take 1 tablet (20 mg total) by mouth daily.   [name] gabapentin (NEURONTIN) 300 MG capsule Take 2 cap twice a daily for neuropathy   . hydrochlorothiazide (HYDRODIURIL) 25 MG tablet Take 25 mg by mouth daily.   [name] HYDROcodone-acetaminophen (NORCO/VICODIN) 5-325 MG tablet Take 1 tablet by mouth every 6 (six) hours as needed for moderate pain.   [name] ibuprofen (ADVIL,MOTRIN) 800 MG tablet Take 800 mg by mouth. Reported on 06/22/2015 06/16/2015: Can take up to 3 tablets per day as needed.   [name] ipratropium-albuterol (DUONEB) 0.5-2.5 (3) MG/3ML SOLN Take 3 mLs by nebulization every 4 (four) hours as needed.   . Lidocaine HCl 3 % GEL Reported on 06/22/2015 06/07/2015: Received from: [facility]  . lisinopril (PRINIVIL,ZESTRIL) 10 MG tablet Take 1 tablet (10 mg total) by mouth daily. (Patient taking differently: Take 20 mg by mouth daily. )   . lisinopril (PRINIVIL,ZESTRIL) 20 MG tablet Take 1 tablet by mouth.   . Magnesium 250 MG TABS Take by mouth. 06/22/2015: Reports taking once a day  . metoprolol succinate (TOPROL-XL) 25 MG 24 hr tablet Take 12.5 mg by mouth.   . ondansetron (ZOFRAN) 8 MG tablet Take 1 tablet (8 mg total) by mouth every 8 (eight) hours as needed for nausea or vomiting.   [name] oxybutynin (DITROPAN-XL) 5 MG 24 hr tablet Take 1 tablet (5  mg total) by mouth 2 (two) times daily.   . phenazopyridine (PYRIDIUM) 200 MG tablet Take 1 tablet (200 mg total) by mouth 3 (three) times daily as needed for pain.   [name] Potassium 99 MG TABS Take by mouth. 06/16/2015: Takes 2 tablets everyday.  . predniSONE (DELTASONE) 10 MG tablet Use per dose pack   . spironolactone (ALDACTONE) 25 MG tablet    . SPS 15 GM/60ML suspension    . traMADol (ULTRAM) 50 MG tablet Take 1 tablet (50 mg total) by mouth 2 (two) times daily. One tab po bid prn for pain   . [DISCONTINUED] amLODipine (NORVASC) 10 MG tablet Take 1 tab po daily    . [DISCONTINUED] FLUoxetine (PROZAC) 40 MG capsule Take 1 capsule (40 mg total) by mouth daily. 09/02/2018: change to duloxetine.  [name] azithromycin (ZITHROMAX) 250 MG tablet Take as directed. (Patient not taking: Reported on 09/02/2018)   . DULoxetine (CYMBALTA) 30 MG capsule Take 1 capsule (30 mg total) by mouth daily.    No facility-administered encounter medications on file as of 09/02/2018.     Surgical History: Past Surgical History:  Procedure Laterality Date  . ABCESS DRAINAGE  2016   Abdominal abcess due to diverticulitis  . caridoverter defibrillator  11/14/2017   ICD  . CATARACT EXTRACTION W/PHACO Left 10/26/2015   Procedure: CATARACT EXTRACTION PHACO AND INTRAOCULAR LENS PLACEMENT (IOC) LEFT EYE;  Surgeon: Leandrew Koyanagi, MD;  Location: [place];  Service: Ophthalmology;  Laterality: Left;  . CATARACT EXTRACTION W/PHACO Right 12/07/2015   Procedure: CATARACT EXTRACTION PHACO AND INTRAOCULAR LENS PLACEMENT (IOC);  Surgeon: Leandrew Koyanagi, MD;  Location: [place];  Service: Ophthalmology;  Laterality: Right;  . CHOLECYSTECTOMY    . FOOT SURGERY     Per patient, bilateral foot surgery.  [name] PELVIC FRACTURE SURGERY     Per patient for cancer.    Medical History: Past Medical History:  Diagnosis Date  . Arthritis   . Cancer ([place])   . Deep vein thrombosis (DVT) ([place]) 2015  . Depression   . GERD (gastroesophageal reflux disease)   . Hypertension   . Weakness of both legs     Family History: Family History  Problem Relation Age of Onset  . Depression Sister   . Heart disease Sister   . Early death Sister   . Depression Sister   . Depression Sister   . Depression Sister   . Depression Sister     Social History   Socioeconomic History  . Marital status: Married    Spouse name: Not on file  . Number of children: Not on file  . Years of education: Not on file  . Highest education level: Not on file  Occupational History  . Not on file   Social Needs  . Financial resource strain: Not on file  . Food insecurity:    Worry: Not on file    Inability: Not on file  . Transportation needs:    Medical: Not on file    Non-medical: Not on file  Tobacco Use  . Smoking status: Never Smoker  . Smokeless tobacco: Never Used  Substance and Sexual Activity  . Alcohol use: No  . Drug use: No  . Sexual activity: Not Currently  Lifestyle  . Physical activity:    Days per week: Not on file    Minutes per session: Not on file  . Stress: Not on file  Relationships  . Social connections:    Talks on phone:  Not on file    Gets together: Not on file    Attends religious service: Not on file    Active member of club or organization: Not on file    Attends meetings of clubs or organizations: Not on file    Relationship status: Not on file  . Intimate partner violence:    Fear of current or ex partner: Not on file    Emotionally abused: Not on file    Physically abused: Not on file    Forced sexual activity: Not on file  Other Topics Concern  . Not on file  Social History Narrative  . Not on file      Review of Systems  Constitutional: Positive for activity change and fatigue. Negative for chills and unexpected weight change.  HENT: Negative for congestion, postnasal drip, rhinorrhea, sneezing and sore throat.   Respiratory: Negative for cough, chest tightness, shortness of breath and wheezing.   Cardiovascular: Negative for chest pain and palpitations.       Bilateral leg pain with cold sensation in both feet, stretching up above the knees.   Gastrointestinal: Negative for abdominal pain, constipation, diarrhea, nausea and vomiting.  Endocrine: Negative for cold intolerance, heat intolerance, polydipsia and polyuria.  Musculoskeletal: Positive for arthralgias, back pain, gait problem and myalgias. Negative for joint swelling and neck pain.  Skin: Negative for rash.  Allergic/Immunologic: Negative for environmental  allergies.  Neurological: Positive for headaches. Negative for dizziness, tremors and numbness.  Hematological: Negative for adenopathy. Does not bruise/bleed easily.  Psychiatric/Behavioral: Positive for dysphoric mood. Negative for behavioral problems (Depression), sleep disturbance and suicidal ideas. The patient is not nervous/anxious.     Today's Vitals   09/02/18 1350  BP: (!) 120/58  Pulse: 69  Resp: 16  Weight: 248 lb (112.5 kg)  Height: 5' (1.524 m)   Body mass index is 48.43 kg/m.  Observation/Objective:   The patient is alert and oriented. She is pleasant and answers all questions appropriately. Breathing is non-labored. She is in no acute distress at this time.    Assessment/Plan: 1. Lumbago-sciatica due to displacement of lumbar intervertebral disc Will start low dose duloxetine to help control overall pain level. She should continue to see pain management provider as scheduled   2. Moderate major depression (HCC) Change fluoxetine to duloxetine 30mg  daily. Instructions provided for her to safely wean off fluoxetine and start duloxetine. She voiced understanding of the instructions.  - DULoxetine (CYMBALTA) 30 MG capsule; Take 1 capsule (30 mg total) by mouth daily.  Dispense: 90 capsule; Refill: 1  3. Essential hypertension Stable. Continue bp medication as prescribed   General Counseling: Jamirra verbalizes understanding of the findings of today's phone visit and agrees with plan of treatment. I have discussed any further diagnostic evaluation that may be needed or ordered today. We also reviewed her medications today. she has been encouraged to call the office with any questions or concerns that should arise related to todays visit.  This patient was seen by [place] with Dr Lavera Guise as a part of collaborative care agreement  Meds ordered this encounter  Medications  . DULoxetine (CYMBALTA) 30 MG capsule    Sig: Take 1 capsule (30 mg  total) by mouth daily.    Dispense:  90 capsule    Refill:  1    Weaning off fluoxetine. Needs help with generalized joint pain.    Order Specific Question:   Supervising Provider    Answer:  KHAN, FOZIA M [[id number]    Time spent: 25 Minutes - At least 15 minutes was spent reviewing the record with patient.     Dr Lavera Guise Internal medicine

## 2018-09-03 ENCOUNTER — Ambulatory Visit (INDEPENDENT_AMBULATORY_CARE_PROVIDER_SITE_OTHER): Payer: Medicare HMO

## 2018-09-03 ENCOUNTER — Other Ambulatory Visit: Payer: Self-pay

## 2018-09-03 DIAGNOSIS — G4733 Obstructive sleep apnea (adult) (pediatric): Secondary | ICD-10-CM

## 2018-09-03 NOTE — Progress Notes (Signed)
95 percentile pressure 10   95th percentile leak 21.7   apnea index 0.7 /hr  apnea-hypopnea index  0.9 /hr   total days used  >4 hr 75 days  total days used <4 hr 7 days  Total compliance 83 percent  She is doing great on cpap. She states she has been very sick, but has still did great on cpap.

## 2018-09-09 ENCOUNTER — Other Ambulatory Visit: Payer: Self-pay

## 2018-09-09 ENCOUNTER — Encounter: Payer: Self-pay | Admitting: Adult Health

## 2018-09-09 MED ORDER — AMLODIPINE BESYLATE 10 MG PO TABS: ORAL_TABLET | ORAL | 1 refills | Status: DC

## 2018-09-10 DIAGNOSIS — F419 Anxiety disorder, unspecified: Secondary | ICD-10-CM | POA: Diagnosis not present

## 2018-09-10 DIAGNOSIS — F331 Major depressive disorder, recurrent, moderate: Secondary | ICD-10-CM | POA: Diagnosis not present

## 2018-09-22 DIAGNOSIS — F321 Major depressive disorder, single episode, moderate: Secondary | ICD-10-CM | POA: Insufficient documentation

## 2018-10-01 DIAGNOSIS — G4733 Obstructive sleep apnea (adult) (pediatric): Secondary | ICD-10-CM | POA: Diagnosis not present

## 2018-10-02 DIAGNOSIS — F331 Major depressive disorder, recurrent, moderate: Secondary | ICD-10-CM | POA: Diagnosis not present

## 2018-10-02 DIAGNOSIS — F419 Anxiety disorder, unspecified: Secondary | ICD-10-CM | POA: Diagnosis not present

## 2018-10-15 DIAGNOSIS — I110 Hypertensive heart disease with heart failure: Secondary | ICD-10-CM | POA: Diagnosis not present

## 2018-10-15 DIAGNOSIS — C55 Malignant neoplasm of uterus, part unspecified: Secondary | ICD-10-CM | POA: Diagnosis not present

## 2018-10-15 DIAGNOSIS — Z95810 Presence of automatic (implantable) cardiac defibrillator: Secondary | ICD-10-CM | POA: Diagnosis not present

## 2018-10-15 DIAGNOSIS — I428 Other cardiomyopathies: Secondary | ICD-10-CM | POA: Diagnosis not present

## 2018-10-15 DIAGNOSIS — I340 Nonrheumatic mitral (valve) insufficiency: Secondary | ICD-10-CM | POA: Diagnosis not present

## 2018-10-15 DIAGNOSIS — I5020 Unspecified systolic (congestive) heart failure: Secondary | ICD-10-CM | POA: Diagnosis not present

## 2018-10-15 DIAGNOSIS — H269 Unspecified cataract: Secondary | ICD-10-CM | POA: Diagnosis not present

## 2018-10-15 DIAGNOSIS — Z86718 Personal history of other venous thrombosis and embolism: Secondary | ICD-10-CM | POA: Diagnosis not present

## 2018-10-15 DIAGNOSIS — Z79899 Other long term (current) drug therapy: Secondary | ICD-10-CM | POA: Diagnosis not present

## 2018-10-15 DIAGNOSIS — I10 Essential (primary) hypertension: Secondary | ICD-10-CM | POA: Diagnosis not present

## 2018-10-16 ENCOUNTER — Telehealth: Payer: Self-pay

## 2018-10-17 ENCOUNTER — Ambulatory Visit: Payer: Self-pay | Admitting: Nurse Practitioner

## 2018-10-17 ENCOUNTER — Other Ambulatory Visit: Payer: Self-pay | Admitting: Nurse Practitioner

## 2018-10-17 DIAGNOSIS — R197 Diarrhea, unspecified: Secondary | ICD-10-CM

## 2018-10-17 DIAGNOSIS — K529 Noninfective gastroenteritis and colitis, unspecified: Secondary | ICD-10-CM

## 2018-10-17 MED ORDER — DICYCLOMINE HCL 10 MG PO CAPS: ORAL_CAPSULE | ORAL | 2 refills | Status: DC

## 2018-10-17 MED ORDER — CIPROFLOXACIN HCL 500 MG PO TABS: 500.0000 mg | ORAL_TABLET | Freq: Two times a day (BID) | ORAL | 0 refills | Status: DC

## 2018-10-17 NOTE — Telephone Encounter (Signed)
Pt advised we send cipro to [facility] see other message

## 2018-10-17 NOTE — Progress Notes (Signed)
C/o diverticulitis flare added cipro 500mg  twice daily for 10 days. Also added dicyclomine which can be taken up to three times daily if needed for intestinal cramping and diarrhea. Both sent to [facility] on [address].

## 2018-10-17 NOTE — Telephone Encounter (Signed)
C/o diverticulitis flare added cipro 500mg  twice daily for 10 days. Also added dicyclomine which can be taken up to three times daily if needed for intestinal cramping and diarrhea. Both sent to [facility] on [address].

## 2018-10-21 DIAGNOSIS — I10 Essential (primary) hypertension: Secondary | ICD-10-CM | POA: Diagnosis not present

## 2018-10-21 DIAGNOSIS — I739 Peripheral vascular disease, unspecified: Secondary | ICD-10-CM | POA: Diagnosis not present

## 2018-10-21 DIAGNOSIS — E785 Hyperlipidemia, unspecified: Secondary | ICD-10-CM | POA: Diagnosis not present

## 2018-10-23 DIAGNOSIS — S86811A Strain of other muscle(s) and tendon(s) at lower leg level, right leg, initial encounter: Secondary | ICD-10-CM | POA: Diagnosis not present

## 2018-10-27 ENCOUNTER — Encounter: Payer: Self-pay | Admitting: Nurse Practitioner

## 2018-10-27 ENCOUNTER — Other Ambulatory Visit: Payer: Self-pay

## 2018-10-27 ENCOUNTER — Ambulatory Visit (INDEPENDENT_AMBULATORY_CARE_PROVIDER_SITE_OTHER): Payer: Medicare HMO | Admitting: Nurse Practitioner

## 2018-10-27 VITALS — BP 137/67 | HR 65 | Resp 16 | Ht 60.0 in | Wt 257.2 lb

## 2018-10-27 DIAGNOSIS — I739 Peripheral vascular disease, unspecified: Secondary | ICD-10-CM

## 2018-10-27 MED ORDER — CILOSTAZOL 50 MG PO TABS: 50.0000 mg | ORAL_TABLET | Freq: Two times a day (BID) | ORAL | 2 refills | Status: DC

## 2018-10-27 NOTE — Progress Notes (Signed)
[facility] [place], [place] [address]  Internal MEDICINE  Office Visit Note  Patient Name: Diana Bernard  [id number]  [id number]  Date of Service: 11/01/2018  Chief Complaint  Patient presents with  . Leg Pain    ortho did not see anything in x-ray. muscle relaxer(replace ibuprofen) and tramdol does not work for her.  . Hypertension    The patient is c/o bilateral lower leg pain. Legs feels so cold that they hurt. Has not noted any color change or unusual swelling. Pulses are palpable in both feet. Did have ABI done which showed some abnormality in the wave forms without evidence of complete stenosis. She did see a vein and vascular provider who did not feel that circulatory issues were causing the problems and pain in her legs.       Current Medication: Outpatient Encounter Medications as of 10/27/2018  Medication Sig Note  . amLODipine (NORVASC) 10 MG tablet Take 1 tab po daily   . aspirin EC 325 MG tablet Take 325 mg by mouth. 06/16/2015: Takes 1 tablet everyday.   . cholecalciferol (VITAMIN D) 1000 units tablet Take 1,000 Units by mouth daily.   . ciprofloxacin (CIPRO) 500 MG tablet Take 1 tablet (500 mg total) by mouth 2 (two) times daily.   . CYANOCOBALAMIN IJ Inject as directed.   . dicyclomine (BENTYL) 10 MG capsule Take 1 capsule po TID prn intestinal cramping/diarrhea   . diphenoxylate-atropine (LOMOTIL) 2.5-0.025 MG tablet Take 1 tablet by mouth 4 (four) times daily as needed for diarrhea or loose stools.   . docusate sodium (STOOL SOFTENER) 100 MG capsule Take 200 mg by mouth daily.  06/07/2015: Received from: [facility]  . DULoxetine (CYMBALTA) 30 MG capsule Take 1 capsule (30 mg total) by mouth daily.   [name] esomeprazole (NEXIUM) 20 MG capsule Take 20 mg by mouth daily at 12 noon.   [name] ESOMEPRAZOLE MAGNESIUM PO Take 40 mg by mouth.   . furosemide (LASIX) 20 MG tablet Take 1 tablet (20 mg total) by mouth daily.   [name] gabapentin (NEURONTIN)  300 MG capsule Take 2 cap twice a daily for neuropathy   . hydrochlorothiazide (HYDRODIURIL) 25 MG tablet Take 25 mg by mouth daily.   [name] HYDROcodone-acetaminophen (NORCO/VICODIN) 5-325 MG tablet Take 1 tablet by mouth every 6 (six) hours as needed for moderate pain.   [name] ibuprofen (ADVIL,MOTRIN) 800 MG tablet Take 800 mg by mouth. Reported on 06/22/2015 06/16/2015: Can take up to 3 tablets per day as needed.   [name] ipratropium-albuterol (DUONEB) 0.5-2.5 (3) MG/3ML SOLN Take 3 mLs by nebulization every 4 (four) hours as needed.   . Lidocaine HCl 3 % GEL Reported on 06/22/2015 06/07/2015: Received from: [facility]  . lisinopril (PRINIVIL,ZESTRIL) 10 MG tablet Take 1 tablet (10 mg total) by mouth daily. (Patient taking differently: Take 20 mg by mouth daily. )   . lisinopril (PRINIVIL,ZESTRIL) 20 MG tablet Take 1 tablet by mouth.   . Magnesium 250 MG TABS Take by mouth. 06/22/2015: Reports taking once a day  . metoprolol succinate (TOPROL-XL) 25 MG 24 hr tablet Take 12.5 mg by mouth.   . ondansetron (ZOFRAN) 8 MG tablet Take 1 tablet (8 mg total) by mouth every 8 (eight) hours as needed for nausea or vomiting.   [name] oxybutynin (DITROPAN-XL) 5 MG 24 hr tablet Take 1 tablet (5 mg total) by mouth 2 (two) times daily.   . phenazopyridine (PYRIDIUM) 200 MG tablet Take 1  tablet (200 mg total) by mouth 3 (three) times daily as needed for pain.   [name] Potassium 99 MG TABS Take by mouth. 06/16/2015: Takes 2 tablets everyday.  [name] spironolactone (ALDACTONE) 25 MG tablet    . SPS 15 GM/60ML suspension    . traMADol (ULTRAM) 50 MG tablet Take 1 tablet (50 mg total) by mouth 2 (two) times daily. One tab po bid prn for pain   . cilostazol (PLETAL) 50 MG tablet Take 1 tablet (50 mg total) by mouth 2 (two) times daily.   . [DISCONTINUED] azithromycin (ZITHROMAX) 250 MG tablet Take as directed. (Patient not taking: Reported on 09/02/2018)   . [DISCONTINUED] predniSONE (DELTASONE) 10 MG tablet Use per dose pack (Patient not taking:  Reported on 10/27/2018)    No facility-administered encounter medications on file as of 10/27/2018.     Surgical History: Past Surgical History:  Procedure Laterality Date  . ABCESS DRAINAGE  2016   Abdominal abcess due to diverticulitis  . caridoverter defibrillator  11/14/2017   ICD  . CATARACT EXTRACTION W/PHACO Left 10/26/2015   Procedure: CATARACT EXTRACTION PHACO AND INTRAOCULAR LENS PLACEMENT (IOC) LEFT EYE;  Surgeon: Leandrew Koyanagi, MD;  Location: [place];  Service: Ophthalmology;  Laterality: Left;  . CATARACT EXTRACTION W/PHACO Right 12/07/2015   Procedure: CATARACT EXTRACTION PHACO AND INTRAOCULAR LENS PLACEMENT (IOC);  Surgeon: Leandrew Koyanagi, MD;  Location: [place];  Service: Ophthalmology;  Laterality: Right;  . CHOLECYSTECTOMY    . FOOT SURGERY     Per patient, bilateral foot surgery.  [name] PELVIC FRACTURE SURGERY     Per patient for cancer.    Medical History: Past Medical History:  Diagnosis Date  . Arthritis   . Cancer ([place])   . Deep vein thrombosis (DVT) ([place]) 2015  . Depression   . GERD (gastroesophageal reflux disease)   . Hypertension   . Weakness of both legs     Family History: Family History  Problem Relation Age of Onset  . Depression Sister   . Heart disease Sister   . Early death Sister   . Depression Sister   . Depression Sister   . Depression Sister   . Depression Sister     Social History   Socioeconomic History  . Marital status: Married    Spouse name: Not on file  . Number of children: Not on file  . Years of education: Not on file  . Highest education level: Not on file  Occupational History  . Not on file  Social Needs  . Financial resource strain: Not on file  . Food insecurity    Worry: Not on file    Inability: Not on file  . Transportation needs    Medical: Not on file    Non-medical: Not on file  Tobacco Use  . Smoking status: Never Smoker  . Smokeless tobacco: Never Used  Substance  and Sexual Activity  . Alcohol use: No  . Drug use: No  . Sexual activity: Not Currently  Lifestyle  . Physical activity    Days per week: Not on file    Minutes per session: Not on file  . Stress: Not on file  Relationships  . Social Herbalist on phone: Not on file    Gets together: Not on file    Attends religious service: Not on file    Active member of club or organization: Not on file    Attends meetings of clubs or organizations: Not  on file    Relationship status: Not on file  . Intimate partner violence    Fear of current or ex partner: Not on file    Emotionally abused: Not on file    Physically abused: Not on file    Forced sexual activity: Not on file  Other Topics Concern  . Not on file  Social History Narrative  . Not on file      Review of Systems  Constitutional: Positive for activity change and fatigue. Negative for chills and unexpected weight change.  HENT: Negative for congestion, postnasal drip, rhinorrhea, sneezing and sore throat.   Respiratory: Negative for cough, chest tightness, shortness of breath and wheezing.   Cardiovascular: Negative for chest pain and palpitations.       Bilateral leg pain with cold sensation in both feet, stretching up above the knees.   Gastrointestinal: Positive for abdominal pain. Negative for constipation, diarrhea, nausea and vomiting.  Endocrine: Negative for cold intolerance, heat intolerance, polydipsia and polyuria.  Musculoskeletal: Positive for arthralgias, back pain, gait problem and myalgias. Negative for joint swelling and neck pain.  Skin: Negative for rash.  Allergic/Immunologic: Negative for environmental allergies.  Neurological: Negative for dizziness, tremors, numbness and headaches.  Hematological: Negative for adenopathy. Does not bruise/bleed easily.  Psychiatric/Behavioral: Negative for behavioral problems (Depression), sleep disturbance and suicidal ideas. The patient is not  nervous/anxious.     Today's Vitals   10/27/18 1618  BP: 137/67  Pulse: 65  Resp: 16  SpO2: 98%  Weight: 257 lb 3.2 oz (116.7 kg)  Height: 5' (1.524 m)   Body mass index is 50.23 kg/m.  Physical Exam Vitals signs and nursing note reviewed.  Constitutional:      General: She is not in acute distress.    Appearance: Normal appearance. She is well-developed. She is obese. She is not diaphoretic.  HENT:     Head: Normocephalic and atraumatic.     Mouth/Throat:     Pharynx: No oropharyngeal exudate.  Eyes:     Pupils: Pupils are equal, round, and reactive to light.  Neck:     Musculoskeletal: Normal range of motion and neck supple.     Thyroid: No thyromegaly.     Vascular: No JVD.     Trachea: No tracheal deviation.  Cardiovascular:     Rate and Rhythm: Normal rate and regular rhythm.     Heart sounds: Normal heart sounds. No murmur. No friction rub. No gallop.      Comments: Bilateral lower extremities have mild, non-pitting edema. They are cool to palpate. Pulses are 1+bilaterally.  Pulmonary:     Effort: Pulmonary effort is normal. No respiratory distress.     Breath sounds: Normal breath sounds. No wheezing or rales.  Chest:     Chest wall: No tenderness.  Abdominal:     General: Bowel sounds are normal.     Palpations: Abdomen is soft.  Musculoskeletal: Normal range of motion.     Comments: Moderate to severe lower back pain. This is especially severe with bending and twisting at the waist. No visible or palpable abnormalities at this time.   Lymphadenopathy:     Cervical: No cervical adenopathy.  Skin:    General: Skin is warm and dry.  Neurological:     Mental Status: She is alert and oriented to person, place, and time.     Cranial Nerves: No cranial nerve deficit.  Psychiatric:        Behavior: Behavior normal.  Thought Content: Thought content normal.        Judgment: Judgment normal.    Assessment/Plan: 1. Intermittent claudication (HCC) Start  pletal 50mg  twice daily. Advised she not take additional NSAIDs while taking Pletal. May use tylenol as needed and as indicated.  - cilostazol (PLETAL) 50 MG tablet; Take 1 tablet (50 mg total) by mouth 2 (two) times daily.  Dispense: 60 tablet; Refill: 2  2. Peripheral arterial disease ([place]) Reviewed results of arterial ultrasound of lower extremities showing abnormal wave forms without complete stenosis.   General Counseling: Tannya verbalizes understanding of the findings of todays visit and agrees with plan of treatment. I have discussed any further diagnostic evaluation that may be needed or ordered today. We also reviewed her medications today. she has been encouraged to call the office with any questions or concerns that should arise related to todays visit.    This patient was seen by [place] with Dr Lavera Guise as a part of collaborative care agreement  Meds ordered this encounter  Medications  . cilostazol (PLETAL) 50 MG tablet    Sig: Take 1 tablet (50 mg total) by mouth 2 (two) times daily.    Dispense:  60 tablet    Refill:  2    Order Specific Question:   Supervising Provider    Answer:   Lavera Guise [[id number]]    Time spent: 85 Minutes      Dr Lavera Guise Internal medicine

## 2018-11-01 DIAGNOSIS — I739 Peripheral vascular disease, unspecified: Secondary | ICD-10-CM | POA: Insufficient documentation

## 2018-11-03 ENCOUNTER — Telehealth: Payer: Self-pay

## 2018-11-05 ENCOUNTER — Other Ambulatory Visit: Payer: Self-pay | Admitting: Nurse Practitioner

## 2018-11-05 DIAGNOSIS — K529 Noninfective gastroenteritis and colitis, unspecified: Secondary | ICD-10-CM

## 2018-11-05 MED ORDER — CIPROFLOXACIN HCL 500 MG PO TABS: 500.0000 mg | ORAL_TABLET | Freq: Two times a day (BID) | ORAL | 0 refills | Status: DC

## 2018-11-05 NOTE — Progress Notes (Signed)
For flare diverticulitis, sent prescription for cipro 500mg  twice daily for 10 days. Sent to [facility]. This has helped in the past.

## 2018-11-05 NOTE — Telephone Encounter (Signed)
For flare diverticulitis, sent prescription for cipro 500mg  twice daily for 10 days. Sent to [facility]. This has helped in the past.

## 2018-11-05 NOTE — Telephone Encounter (Signed)
Informed pt that she have a new rx sent to pharmacy and to call back if it doesn't help.

## 2018-11-07 DIAGNOSIS — Z4502 Encounter for adjustment and management of automatic implantable cardiac defibrillator: Secondary | ICD-10-CM | POA: Diagnosis not present

## 2018-11-13 DIAGNOSIS — G620 Drug-induced polyneuropathy: Secondary | ICD-10-CM | POA: Diagnosis not present

## 2018-11-13 DIAGNOSIS — G894 Chronic pain syndrome: Secondary | ICD-10-CM | POA: Diagnosis not present

## 2018-11-13 DIAGNOSIS — T451X5A Adverse effect of antineoplastic and immunosuppressive drugs, initial encounter: Secondary | ICD-10-CM | POA: Diagnosis not present

## 2018-11-13 DIAGNOSIS — M545 Low back pain: Secondary | ICD-10-CM | POA: Diagnosis not present

## 2018-11-13 DIAGNOSIS — F331 Major depressive disorder, recurrent, moderate: Secondary | ICD-10-CM | POA: Diagnosis not present

## 2018-11-17 ENCOUNTER — Other Ambulatory Visit: Payer: Self-pay

## 2018-11-17 ENCOUNTER — Ambulatory Visit (INDEPENDENT_AMBULATORY_CARE_PROVIDER_SITE_OTHER): Payer: Medicare HMO | Admitting: Nurse Practitioner

## 2018-11-17 ENCOUNTER — Encounter: Payer: Self-pay | Admitting: Nurse Practitioner

## 2018-11-17 VITALS — BP 142/71 | HR 85 | Resp 16 | Ht 60.0 in | Wt 261.0 lb

## 2018-11-17 DIAGNOSIS — I10 Essential (primary) hypertension: Secondary | ICD-10-CM

## 2018-11-17 DIAGNOSIS — M5127 Other intervertebral disc displacement, lumbosacral region: Secondary | ICD-10-CM

## 2018-11-17 DIAGNOSIS — I739 Peripheral vascular disease, unspecified: Secondary | ICD-10-CM | POA: Diagnosis not present

## 2018-11-17 DIAGNOSIS — F321 Major depressive disorder, single episode, moderate: Secondary | ICD-10-CM | POA: Diagnosis not present

## 2018-11-17 MED ORDER — DULOXETINE HCL 30 MG PO CPEP: ORAL_CAPSULE | ORAL | 1 refills | Status: DC

## 2018-11-17 NOTE — Progress Notes (Signed)
[facility] [place], [place] [address]  Internal MEDICINE  Office Visit Note  Patient Name: Diana Bernard  [id number]  [id number]  Date of Service: 11/17/2018  Chief Complaint  Patient presents with  . Hypertension  . Medication Management    antidepressant  . Depression    The patient is here for follow up visit. She states that she is doing well. Feels less depressed and less anxious. Currently on cymbalta 30mg . Did have discussion with pain management provider who wants her to increase dose up to 60mg  daily. Patient is agreeable to trying this. She was also started on pletal 50mg  twice daily to help with intermittent claudication of both legs. She states that both legs are feeling some better and she appears more comfortable.       Current Medication: Outpatient Encounter Medications as of 11/17/2018  Medication Sig Note  . amLODipine (NORVASC) 10 MG tablet Take 1 tab po daily   . aspirin EC 325 MG tablet Take 325 mg by mouth. 06/16/2015: Takes 1 tablet everyday.   . cholecalciferol (VITAMIN D) 1000 units tablet Take 1,000 Units by mouth daily.   . cilostazol (PLETAL) 50 MG tablet Take 1 tablet (50 mg total) by mouth 2 (two) times daily.   . ciprofloxacin (CIPRO) 500 MG tablet Take 1 tablet (500 mg total) by mouth 2 (two) times daily.   . CYANOCOBALAMIN IJ Inject as directed.   . dicyclomine (BENTYL) 10 MG capsule Take 1 capsule po TID prn intestinal cramping/diarrhea   . diphenoxylate-atropine (LOMOTIL) 2.5-0.025 MG tablet Take 1 tablet by mouth 4 (four) times daily as needed for diarrhea or loose stools.   . docusate sodium (STOOL SOFTENER) 100 MG capsule Take 200 mg by mouth daily.  06/07/2015: [facility]ceived from: [facility]  . DULoxetine (CYMBALTA) 30 MG capsule Take 1 capsule po bid   . esomeprazole (NEXIUM) 20 MG capsule Take 20 mg by mouth daily at 12 noon.   [name] ESOMEPRAZOLE MAGNESIUM PO Take 40 mg by mouth.   . furosemide (LASIX) 20 MG  tablet Take 1 tablet (20 mg total) by mouth daily.   [name] gabapentin (NEURONTIN) 300 MG capsule Take 2 cap twice a daily for neuropathy   . hydrochlorothiazide (HYDRODIURIL) 25 MG tablet Take 25 mg by mouth daily.   [name] HYDROcodone-acetaminophen (NORCO/VICODIN) 5-325 MG tablet Take 1 tablet by mouth every 6 (six) hours as needed for moderate pain.   [name] ibuprofen (ADVIL,MOTRIN) 800 MG tablet Take 800 mg by mouth. Reported on 06/22/2015 06/16/2015: Can take up to 3 tablets per day as needed.   [name] ipratropium-albuterol (DUONEB) 0.5-2.5 (3) MG/3ML SOLN Take 3 mLs by nebulization every 4 (four) hours as needed.   . Lidocaine HCl 3 % GEL Reported on 06/22/2015 06/07/2015: Received from: [facility]  . lisinopril (PRINIVIL,ZESTRIL) 10 MG tablet Take 1 tablet (10 mg total) by mouth daily. (Patient taking differently: Take 20 mg by mouth daily. )   . lisinopril (PRINIVIL,ZESTRIL) 20 MG tablet Take 1 tablet by mouth.   . Magnesium 250 MG TABS Take by mouth. 06/22/2015: Reports taking once a day  . metoprolol succinate (TOPROL-XL) 25 MG 24 hr tablet Take 12.5 mg by mouth.   . ondansetron (ZOFRAN) 8 MG tablet Take 1 tablet (8 mg total) by mouth every 8 (eight) hours as needed for nausea or vomiting.   [name] oxybutynin (DITROPAN-XL) 5 MG 24 hr tablet Take 1 tablet (5 mg total) by mouth 2 (two) times  daily.   . phenazopyridine (PYRIDIUM) 200 MG tablet Take 1 tablet (200 mg total) by mouth 3 (three) times daily as needed for pain.   [name] Potassium 99 MG TABS Take by mouth. 06/16/2015: Takes 2 tablets everyday.  [name] spironolactone (ALDACTONE) 25 MG tablet    . SPS 15 GM/60ML suspension    . traMADol (ULTRAM) 50 MG tablet Take 1 tablet (50 mg total) by mouth 2 (two) times daily. One tab po bid prn for pain   . [DISCONTINUED] DULoxetine (CYMBALTA) 30 MG capsule Take 1 capsule (30 mg total) by mouth daily.    No facility-administered encounter medications on file as of 11/17/2018.     Surgical History: Past Surgical History:   Procedure Laterality Date  . ABCESS DRAINAGE  2016   Abdominal abcess due to diverticulitis  . caridoverter defibrillator  11/14/2017   ICD  . CATARACT EXTRACTION W/PHACO Left 10/26/2015   Procedure: CATARACT EXTRACTION PHACO AND INTRAOCULAR LENS PLACEMENT (IOC) LEFT EYE;  Surgeon: Leandrew Koyanagi, MD;  Location: [place];  Service: Ophthalmology;  Laterality: Left;  . CATARACT EXTRACTION W/PHACO Right 12/07/2015   Procedure: CATARACT EXTRACTION PHACO AND INTRAOCULAR LENS PLACEMENT (IOC);  Surgeon: Leandrew Koyanagi, MD;  Location: [facility];  Service: Ophthalmology;  Laterality: Right;  . CHOLECYSTECTOMY    . FOOT SURGERY     Per patient, bilateral foot surgery.  [name] PELVIC FRACTURE SURGERY     Per patient for cancer.    Medical History: Past Medical History:  Diagnosis Date  . Arthritis   . Cancer ([place])   . Deep vein thrombosis (DVT) ([place]) 2015  . Depression   . GERD (gastroesophageal reflux disease)   . Hypertension   . Weakness of both legs     Family History: Family History  Problem Relation Age of Onset  . Depression Sister   . Heart disease Sister   . Early death Sister   . Depression Sister   . Depression Sister   . Depression Sister   . Depression Sister     Social History   Socioeconomic History  . Marital status: Married    Spouse name: Not on file  . Number of children: Not on file  . Years of education: Not on file  . Highest education level: Not on file  Occupational History  . Not on file  Social Needs  . Financial resource strain: Not on file  . Food insecurity    Worry: Not on file    Inability: Not on file  . Transportation needs    Medical: Not on file    Non-medical: Not on file  Tobacco Use  . Smoking status: Never Smoker  . Smokeless tobacco: Never Used  Substance and Sexual Activity  . Alcohol use: No  . Drug use: No  . Sexual activity: Not Currently  Lifestyle  . Physical activity    Days per week: Not  on file    Minutes per session: Not on file  . Stress: Not on file  Relationships  . Social Herbalist on phone: Not on file    Gets together: Not on file    Attends religious service: Not on file    Active member of club or organization: Not on file    Attends meetings of clubs or organizations: Not on file    Relationship status: Not on file  . Intimate partner violence    Fear of current or ex partner: Not on file  Emotionally abused: Not on file    Physically abused: Not on file    Forced sexual activity: Not on file  Other Topics Concern  . Not on file  Social History Narrative  . Not on file      Review of Systems  Constitutional: Positive for activity change and fatigue. Negative for chills and unexpected weight change.  HENT: Negative for congestion, postnasal drip, rhinorrhea, sneezing and sore throat.   Respiratory: Negative for cough, chest tightness, shortness of breath and wheezing.   Cardiovascular: Negative for chest pain and palpitations.       Bilateral leg pain with cold sensation in both feet, stretching up above the knees. This has improved since most recent visit. Was started on pletal 50mg  twice daily  Gastrointestinal: Negative for abdominal pain, constipation, diarrhea, nausea and vomiting.  Endocrine: Negative for cold intolerance, heat intolerance, polydipsia and polyuria.  Musculoskeletal: Positive for arthralgias, back pain, gait problem and myalgias. Negative for joint swelling and neck pain.       Overall, slightly improved since starting duloxetine.   Skin: Negative for rash.  Allergic/Immunologic: Negative for environmental allergies.  Neurological: Negative for dizziness, tremors, numbness and headaches.  Hematological: Negative for adenopathy. Does not bruise/bleed easily.  Psychiatric/Behavioral: Positive for dysphoric mood. Negative for behavioral problems (Depression), sleep disturbance and suicidal ideas. The patient is not  nervous/anxious.    Today's Vitals   11/17/18 0953  BP: (!) 142/71  Pulse: 85  Resp: 16  SpO2: 96%  Weight: 261 lb (118.4 kg)  Height: 5' (1.524 m)   Body mass index is 50.97 kg/m.   Physical Exam Vitals signs and nursing note reviewed.  Constitutional:      General: She is not in acute distress.    Appearance: Normal appearance. She is well-developed. She is obese. She is not diaphoretic.  HENT:     Head: Normocephalic and atraumatic.     Mouth/Throat:     Pharynx: No oropharyngeal exudate.  Eyes:     Pupils: Pupils are equal, round, and reactive to light.  Neck:     Musculoskeletal: Normal range of motion and neck supple.     Thyroid: No thyromegaly.     Vascular: No carotid bruit or JVD.     Trachea: No tracheal deviation.  Cardiovascular:     Rate and Rhythm: Normal rate and regular rhythm.     Heart sounds: Normal heart sounds. No murmur. No friction rub. No gallop.      Comments: Bilateral lower extremities have mild, non-pitting edema. They are cool to palpate. Pulses are 1+bilaterally.  Pulmonary:     Effort: Pulmonary effort is normal. No respiratory distress.     Breath sounds: Normal breath sounds. No wheezing or rales.  Chest:     Chest wall: No tenderness.  Abdominal:     General: Bowel sounds are normal.     Palpations: Abdomen is soft.  Musculoskeletal: Normal range of motion.     Comments: Moderate to severe lower back pain. This is especially severe with bending and twisting at the waist. improved some since her most recent visit. No visible or palpable abnormalities at this time.   Lymphadenopathy:     Cervical: No cervical adenopathy.  Skin:    General: Skin is warm and dry.  Neurological:     Mental Status: She is alert and oriented to person, place, and time.     Cranial Nerves: No cranial nerve deficit.  Psychiatric:  Behavior: Behavior normal.        Thought Content: Thought content normal.        Judgment: Judgment normal.    Assessment/Plan: 1. Intermittent claudication ([place]) This has improved since starting on pletal 50mg  twice daily. Continue. Adjust as indicated.   2. Lumbago-sciatica due to displacement of lumbar intervertebral disc Increase duloxetine to 30mg  twice daily. Continue with visits to pain management provider as scheduled.   3. Moderate major depression (HCC) Improved. Increase duloxetine 30mg  to twice daily.  - DULoxetine (CYMBALTA) 30 MG capsule; Take 1 capsule po bid  Dispense: 180 capsule; Refill: 1  4. Essential hypertension Stable. Continue bp medication as prescribed   General Counseling: Sheniah verbalizes understanding of the findings of todays visit and agrees with plan of treatment. I have discussed any further diagnostic evaluation that may be needed or ordered today. We also reviewed her medications today. she has been encouraged to call the office with any questions or concerns that should arise related to todays visit.  This patient was seen by [place] with Dr Lavera Guise as a part of collaborative care agreement  Meds ordered this encounter  Medications  . DULoxetine (CYMBALTA) 30 MG capsule    Sig: Take 1 capsule po bid    Dispense:  180 capsule    Refill:  1    Please note increased dosing    Order Specific Question:   Supervising Provider    Answer:   Lavera Guise [[id number]]    Time spent:25 Minutes      Dr Lavera Guise Internal medicine

## 2018-11-28 DIAGNOSIS — G8929 Other chronic pain: Secondary | ICD-10-CM | POA: Diagnosis not present

## 2018-11-28 DIAGNOSIS — M545 Low back pain: Secondary | ICD-10-CM | POA: Diagnosis not present

## 2018-12-03 DIAGNOSIS — M545 Low back pain: Secondary | ICD-10-CM | POA: Diagnosis not present

## 2018-12-03 DIAGNOSIS — G8929 Other chronic pain: Secondary | ICD-10-CM | POA: Diagnosis not present

## 2018-12-03 DIAGNOSIS — F331 Major depressive disorder, recurrent, moderate: Secondary | ICD-10-CM | POA: Diagnosis not present

## 2018-12-03 DIAGNOSIS — G894 Chronic pain syndrome: Secondary | ICD-10-CM | POA: Diagnosis not present

## 2018-12-03 DIAGNOSIS — F419 Anxiety disorder, unspecified: Secondary | ICD-10-CM | POA: Diagnosis not present

## 2018-12-10 DIAGNOSIS — M545 Low back pain: Secondary | ICD-10-CM | POA: Diagnosis not present

## 2018-12-10 DIAGNOSIS — G8929 Other chronic pain: Secondary | ICD-10-CM | POA: Diagnosis not present

## 2018-12-18 DIAGNOSIS — M545 Low back pain: Secondary | ICD-10-CM | POA: Diagnosis not present

## 2018-12-18 DIAGNOSIS — G8929 Other chronic pain: Secondary | ICD-10-CM | POA: Diagnosis not present

## 2018-12-23 DIAGNOSIS — M545 Low back pain: Secondary | ICD-10-CM | POA: Diagnosis not present

## 2018-12-23 DIAGNOSIS — G8929 Other chronic pain: Secondary | ICD-10-CM | POA: Diagnosis not present

## 2018-12-29 DIAGNOSIS — G8929 Other chronic pain: Secondary | ICD-10-CM | POA: Diagnosis not present

## 2018-12-29 DIAGNOSIS — M545 Low back pain: Secondary | ICD-10-CM | POA: Diagnosis not present

## 2019-01-13 DIAGNOSIS — M545 Low back pain: Secondary | ICD-10-CM | POA: Diagnosis not present

## 2019-01-13 DIAGNOSIS — G8929 Other chronic pain: Secondary | ICD-10-CM | POA: Diagnosis not present

## 2019-01-22 ENCOUNTER — Encounter: Payer: Self-pay | Admitting: Adult Health

## 2019-01-22 ENCOUNTER — Ambulatory Visit (INDEPENDENT_AMBULATORY_CARE_PROVIDER_SITE_OTHER): Payer: Medicare HMO | Admitting: Adult Health

## 2019-01-22 ENCOUNTER — Other Ambulatory Visit: Payer: Self-pay

## 2019-01-22 VITALS — BP 135/74 | HR 112 | Temp 97.6°F | Wt 250.0 lb

## 2019-01-22 DIAGNOSIS — I10 Essential (primary) hypertension: Secondary | ICD-10-CM | POA: Diagnosis not present

## 2019-01-22 DIAGNOSIS — J0100 Acute maxillary sinusitis, unspecified: Secondary | ICD-10-CM | POA: Diagnosis not present

## 2019-01-22 MED ORDER — AMOXICILLIN-POT CLAVULANATE 875-125 MG PO TABS: 1.0000 | ORAL_TABLET | Freq: Two times a day (BID) | ORAL | 0 refills | Status: DC

## 2019-01-22 NOTE — Progress Notes (Signed)
[facility] [place], [place] [address]  Internal MEDICINE  Telephone Visit  Patient Name: Diana Bernard  [id number]  [id number]  Date of Service: 01/24/2019  I connected with the patient at 355 by telephone and verified the patients identity using two identifiers.   I discussed the limitations, risks, security and privacy concerns of performing an evaluation and management service by telephone and the availability of in person appointments. I also discussed with the patient that there may be a patient responsible charge related to the service.  The patient expressed understanding and agrees to proceed.    Chief Complaint  Patient presents with  . Telephone Assessment  . Telephone Screen  . Headache  . Itchy Eye  . Nasal Congestion    nasal and chest congestion (has used nebulizer), nose is dry    HPI  PT seen via video for sinus pain and pressure that she describes as behind her eyes.  She has tried a few otc medications at this time without much relief.  She denies any fever or productive cough.  She is mostly concerned by the head pressure, and dry nose.      Current Medication: Outpatient Encounter Medications as of 01/22/2019  Medication Sig Note  . amLODipine (NORVASC) 10 MG tablet Take 1 tab po daily   . aspirin EC 325 MG tablet Take 325 mg by mouth. 06/16/2015: Takes 1 tablet everyday.   . cholecalciferol (VITAMIN D) 1000 units tablet Take 1,000 Units by mouth daily.   . cilostazol (PLETAL) 50 MG tablet Take 1 tablet (50 mg total) by mouth 2 (two) times daily.   . CYANOCOBALAMIN IJ Inject as directed.   . dicyclomine (BENTYL) 10 MG capsule Take 1 capsule po TID prn intestinal cramping/diarrhea   . diphenoxylate-atropine (LOMOTIL) 2.5-0.025 MG tablet Take 1 tablet by mouth 4 (four) times daily as needed for diarrhea or loose stools.   . docusate sodium (STOOL SOFTENER) 100 MG capsule Take 200 mg by mouth daily.  06/07/2015: Received from: [facility]  . DULoxetine (CYMBALTA) 30 MG capsule Take 1 capsule po bid   . esomeprazole (NEXIUM) 20 MG capsule Take 20 mg by mouth daily at 12 noon.   [name] ESOMEPRAZOLE MAGNESIUM PO Take 40 mg by mouth.   . furosemide (LASIX) 20 MG tablet Take 1 tablet (20 mg total) by mouth daily.   [name] gabapentin (NEURONTIN) 300 MG capsule Take 2 cap twice a daily for neuropathy   . hydrochlorothiazide (HYDRODIURIL) 25 MG tablet Take 25 mg by mouth daily.   [name] HYDROcodone-acetaminophen (NORCO/VICODIN) 5-325 MG tablet Take 1 tablet by mouth every 6 (six) hours as needed for moderate pain.   [name] ibuprofen (ADVIL,MOTRIN) 800 MG tablet Take 800 mg by mouth. Reported on 06/22/2015 06/16/2015: Can take up to 3 tablets per day as needed.   [name] ipratropium-albuterol (DUONEB) 0.5-2.5 (3) MG/3ML SOLN Take 3 mLs by nebulization every 4 (four) hours as needed.   . Lidocaine HCl 3 % GEL Reported on 06/22/2015 06/07/2015: Received from: [facility]  . lisinopril (PRINIVIL,ZESTRIL) 10 MG tablet Take 1 tablet (10 mg total) by mouth daily. (Patient taking differently: Take 20 mg by mouth daily. )   . lisinopril (PRINIVIL,ZESTRIL) 20 MG tablet Take 1 tablet by mouth.   . Magnesium 250 MG TABS Take by mouth. 06/22/2015: Reports taking once a day  . metoprolol succinate (TOPROL-XL) 25 MG 24 hr tablet Take 12.5 mg by mouth.   . ondansetron (  ZOFRAN) 8 MG tablet Take 1 tablet (8 mg total) by mouth every 8 (eight) hours as needed for nausea or vomiting.   [name] oxybutynin (DITROPAN-XL) 5 MG 24 hr tablet Take 1 tablet (5 mg total) by mouth 2 (two) times daily.   . phenazopyridine (PYRIDIUM) 200 MG tablet Take 1 tablet (200 mg total) by mouth 3 (three) times daily as needed for pain.   [name] Potassium 99 MG TABS Take by mouth. 06/16/2015: Takes 2 tablets everyday.  [name] spironolactone (ALDACTONE) 25 MG tablet    . SPS 15 GM/60ML suspension    . traMADol (ULTRAM) 50 MG tablet Take 1 tablet (50 mg total) by mouth 2 (two) times daily. One tab po bid prn for  pain   . amoxicillin-clavulanate (AUGMENTIN) 875-125 MG tablet Take 1 tablet by mouth 2 (two) times daily.   . [DISCONTINUED] ciprofloxacin (CIPRO) 500 MG tablet Take 1 tablet (500 mg total) by mouth 2 (two) times daily. (Patient not taking: Reported on 01/22/2019)    No facility-administered encounter medications on file as of 01/22/2019.     Surgical History: Past Surgical History:  Procedure Laterality Date  . ABCESS DRAINAGE  2016   Abdominal abcess due to diverticulitis  . caridoverter defibrillator  11/14/2017   ICD  . CATARACT EXTRACTION W/PHACO Left 10/26/2015   Procedure: CATARACT EXTRACTION PHACO AND INTRAOCULAR LENS PLACEMENT (IOC) LEFT EYE;  Surgeon: Leandrew Koyanagi, MD;  Location: [place];  Service: Ophthalmology;  Laterality: Left;  . CATARACT EXTRACTION W/PHACO Right 12/07/2015   Procedure: CATARACT EXTRACTION PHACO AND INTRAOCULAR LENS PLACEMENT (IOC);  Surgeon: Leandrew Koyanagi, MD;  Location: [place];  Service: Ophthalmology;  Laterality: Right;  . CHOLECYSTECTOMY    . FOOT SURGERY     Per patient, bilateral foot surgery.  [name] PELVIC FRACTURE SURGERY     Per patient for cancer.    Medical History: Past Medical History:  Diagnosis Date  . Arthritis   . Cancer ([place])   . Deep vein thrombosis (DVT) ([place]) 2015  . Depression   . GERD (gastroesophageal reflux disease)   . Hypertension   . Weakness of both legs     Family History: Family History  Problem Relation Age of Onset  . Depression Sister   . Heart disease Sister   . Early death Sister   . Depression Sister   . Depression Sister   . Depression Sister   . Depression Sister     Social History   Socioeconomic History  . Marital status: Married    Spouse name: Not on file  . Number of children: Not on file  . Years of education: Not on file  . Highest education level: Not on file  Occupational History  . Not on file  Social Needs  . Financial resource strain: Not on  file  . Food insecurity    Worry: Not on file    Inability: Not on file  . Transportation needs    Medical: Not on file    Non-medical: Not on file  Tobacco Use  . Smoking status: Never Smoker  . Smokeless tobacco: Never Used  Substance and Sexual Activity  . Alcohol use: No  . Drug use: No  . Sexual activity: Not Currently  Lifestyle  . Physical activity    Days per week: Not on file    Minutes per session: Not on file  . Stress: Not on file  Relationships  . Social Herbalist on phone: Not  on file    Gets together: Not on file    Attends religious service: Not on file    Active member of club or organization: Not on file    Attends meetings of clubs or organizations: Not on file    Relationship status: Not on file  . Intimate partner violence    Fear of current or ex partner: Not on file    Emotionally abused: Not on file    Physically abused: Not on file    Forced sexual activity: Not on file  Other Topics Concern  . Not on file  Social History Narrative  . Not on file      Review of Systems  Constitutional: Positive for fatigue. Negative for chills and unexpected weight change.  HENT: Positive for postnasal drip, sinus pressure and sore throat. Negative for congestion, rhinorrhea and sneezing.   Eyes: Negative for photophobia, pain and redness.  Respiratory: Negative for cough, chest tightness and shortness of breath.   Cardiovascular: Negative for chest pain and palpitations.  Gastrointestinal: Negative for abdominal pain, constipation, diarrhea, nausea and vomiting.  Endocrine: Negative.   Genitourinary: Negative for dysuria and frequency.  Musculoskeletal: Negative for arthralgias, back pain, joint swelling and neck pain.  Skin: Negative for rash.  Allergic/Immunologic: Negative.   Neurological: Negative for tremors and numbness.  Hematological: Negative for adenopathy. Does not bruise/bleed easily.  Psychiatric/Behavioral: Negative for  behavioral problems and sleep disturbance. The patient is not nervous/anxious.     Vital Signs: BP 135/74   Pulse (!) 112   Temp 97.6 F (36.4 C)   Wt 250 lb (113.4 kg)   BMI 48.82 kg/m    Observation/Objective:  Well sounding, with NAD. Speaking in full sentences.    Assessment/Plan: 1. Acute non-recurrent maxillary sinusitis Advised patient to take entire course of antibiotics as prescribed with food. Pt should return to clinic in 7-10 days if symptoms fail to improve or new symptoms develop.  - amoxicillin-clavulanate (AUGMENTIN) 875-125 MG tablet; Take 1 tablet by mouth 2 (two) times daily.  Dispense: 14 tablet; Refill: 0  2. Essential hypertension Stable, continue to use medications as prescribed and follow up as scheduled   General Counseling: Kalyssa verbalizes understanding of the findings of today's phone visit and agrees with plan of treatment. I have discussed any further diagnostic evaluation that may be needed or ordered today. We also reviewed her medications today. she has been encouraged to call the office with any questions or concerns that should arise related to todays visit.    No orders of the defined types were placed in this encounter.   Meds ordered this encounter  Medications  . amoxicillin-clavulanate (AUGMENTIN) 875-125 MG tablet    Sig: Take 1 tablet by mouth 2 (two) times daily.    Dispense:  14 tablet    Refill:  0    Time spent: [place] AGNP-C Internal medicine

## 2019-01-27 ENCOUNTER — Ambulatory Visit
Admission: RE | Admit: 2019-01-27 | Discharge: 2019-01-27 | Disposition: A | Discharge status: Home / Self Care | Payer: Medicare HMO | Source: Ambulatory Visit | Attending: Nurse Practitioner | Admitting: Nurse Practitioner

## 2019-01-27 ENCOUNTER — Encounter: Payer: Self-pay | Admitting: Nurse Practitioner

## 2019-01-27 ENCOUNTER — Ambulatory Visit (INDEPENDENT_AMBULATORY_CARE_PROVIDER_SITE_OTHER): Payer: Medicare HMO | Admitting: Nurse Practitioner

## 2019-01-27 ENCOUNTER — Other Ambulatory Visit: Payer: Self-pay

## 2019-01-27 VITALS — Ht 60.0 in | Wt 250.0 lb

## 2019-01-27 DIAGNOSIS — Z20828 Contact with and (suspected) exposure to other viral communicable diseases: Secondary | ICD-10-CM | POA: Insufficient documentation

## 2019-01-27 DIAGNOSIS — J069 Acute upper respiratory infection, unspecified: Secondary | ICD-10-CM

## 2019-01-27 DIAGNOSIS — Z20822 Contact with and (suspected) exposure to covid-19: Secondary | ICD-10-CM

## 2019-01-27 DIAGNOSIS — I5022 Chronic systolic (congestive) heart failure: Secondary | ICD-10-CM

## 2019-01-27 DIAGNOSIS — R0602 Shortness of breath: Secondary | ICD-10-CM

## 2019-01-27 MED ORDER — CIPROFLOXACIN HCL 500 MG PO TABS: 500.0000 mg | ORAL_TABLET | Freq: Two times a day (BID) | ORAL | 0 refills | Status: DC

## 2019-01-27 NOTE — Progress Notes (Signed)
[facility] [place], [place] [address]  Internal MEDICINE  Telephone Visit  Patient Name: Gwendoline Lesko  [id number]  [id number]  Date of Service: 01/27/2019  I connected with the patient at 8:38am by webcam and verified the patients identity using two identifiers.   I discussed the limitations, risks, security and privacy concerns of performing an evaluation and management service by webcam and the availability of in person appointments. I also discussed with the patient that there may be a patient responsible charge related to the service.  The patient expressed understanding and agrees to proceed.    Chief Complaint  Patient presents with  . Telephone Assessment  . Telephone Screen  . Cough  . Sinusitis  . Fever  . Nasal Congestion  . Abdominal Pain  . Fatigue    The patient has been contacted via webcam for follow up visit due to concerns for spread of novel coronavirus. The patient states that she has been sick for about two weeks. She states that her oxygen saturations have been running around 95%. States that when she exerts herself, even a little, the oxygen does drop to about 93%. She states that she feels very fatigued. The patient has been on augmentin for four days. She states that it has not really helped at all. She still is feeling very weak. She is unsure if she has run any fever. She does go back and forth between being diaphoretic and then chilled. She has been doing nebulizer treatments four times daily and these do help a little bit for a little while. 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 She feels like shortness of breath and fatigue have been gradually increasing since then.       Current Medication: Outpatient Encounter Medications as of 01/27/2019  Medication Sig Note  . amLODipine (NORVASC) 10 MG tablet Take 1 tab po daily   . amoxicillin-clavulanate (AUGMENTIN) 875-125 MG tablet Take 1 tablet by mouth 2  (two) times daily.   [name] aspirin EC 325 MG tablet Take 325 mg by mouth. 06/16/2015: Takes 1 tablet everyday.   . cholecalciferol (VITAMIN D) 1000 units tablet Take 1,000 Units by mouth daily.   . cilostazol (PLETAL) 50 MG tablet Take 1 tablet (50 mg total) by mouth 2 (two) times daily.   . ciprofloxacin (CIPRO) 500 MG tablet Take 1 tablet (500 mg total) by mouth 2 (two) times daily.   . CYANOCOBALAMIN IJ Inject as directed.   . dicyclomine (BENTYL) 10 MG capsule Take 1 capsule po TID prn intestinal cramping/diarrhea   . diphenoxylate-atropine (LOMOTIL) 2.5-0.025 MG tablet Take 1 tablet by mouth 4 (four) times daily as needed for diarrhea or loose stools.   . docusate sodium (STOOL SOFTENER) 100 MG capsule Take 200 mg by mouth daily.  06/07/2015: [facility]ceived from: [facility]  . DULoxetine (CYMBALTA) 30 MG capsule Take 1 capsule po bid   . esomeprazole (NEXIUM) 20 MG capsule Take 20 mg by mouth daily at 12 noon.   [name] ESOMEPRAZOLE MAGNESIUM PO Take 40 mg by mouth.   . furosemide (LASIX) 20 MG tablet Take 1 tablet (20 mg total) by mouth daily.   [name] gabapentin (NEURONTIN) 300 MG capsule Take 2 cap twice a daily for neuropathy   . hydrochlorothiazide (HYDRODIURIL) 25 MG tablet Take 25 mg by mouth daily.   [name] HYDROcodone-acetaminophen (NORCO/VICODIN) 5-325 MG tablet Take 1 tablet by mouth every 6 (six) hours as needed for moderate pain.   [name]  ibuprofen (ADVIL,MOTRIN) 800 MG tablet Take 800 mg by mouth. Reported on 06/22/2015 06/16/2015: Can take up to 3 tablets per day as needed.   [name] ipratropium-albuterol (DUONEB) 0.5-2.5 (3) MG/3ML SOLN Take 3 mLs by nebulization every 4 (four) hours as needed.   . Lidocaine HCl 3 % GEL Reported on 06/22/2015 06/07/2015: Received from: [facility]  . lisinopril (PRINIVIL,ZESTRIL) 10 MG tablet Take 1 tablet (10 mg total) by mouth daily. (Patient taking differently: Take 20 mg by mouth daily. )   . lisinopril (PRINIVIL,ZESTRIL) 20 MG tablet Take 1 tablet by mouth.   . Magnesium  250 MG TABS Take by mouth. 06/22/2015: Reports taking once a day  . metoprolol succinate (TOPROL-XL) 25 MG 24 hr tablet Take 12.5 mg by mouth.   . ondansetron (ZOFRAN) 8 MG tablet Take 1 tablet (8 mg total) by mouth every 8 (eight) hours as needed for nausea or vomiting.   [name] oxybutynin (DITROPAN-XL) 5 MG 24 hr tablet Take 1 tablet (5 mg total) by mouth 2 (two) times daily.   . phenazopyridine (PYRIDIUM) 200 MG tablet Take 1 tablet (200 mg total) by mouth 3 (three) times daily as needed for pain.   [name] Potassium 99 MG TABS Take by mouth. 06/16/2015: Takes 2 tablets everyday.  [name] spironolactone (ALDACTONE) 25 MG tablet    . SPS 15 GM/60ML suspension    . traMADol (ULTRAM) 50 MG tablet Take 1 tablet (50 mg total) by mouth 2 (two) times daily. One tab po bid prn for pain    No facility-administered encounter medications on file as of 01/27/2019.     Surgical History: Past Surgical History:  Procedure Laterality Date  . ABCESS DRAINAGE  2016   Abdominal abcess due to diverticulitis  . caridoverter defibrillator  11/14/2017   ICD  . CATARACT EXTRACTION W/PHACO Left 10/26/2015   Procedure: CATARACT EXTRACTION PHACO AND INTRAOCULAR LENS PLACEMENT (IOC) LEFT EYE;  Surgeon: Leandrew Koyanagi, MD;  Location: [place];  Service: Ophthalmology;  Laterality: Left;  . CATARACT EXTRACTION W/PHACO Right 12/07/2015   Procedure: CATARACT EXTRACTION PHACO AND INTRAOCULAR LENS PLACEMENT (IOC);  Surgeon: Leandrew Koyanagi, MD;  Location: [place];  Service: Ophthalmology;  Laterality: Right;  . CHOLECYSTECTOMY    . FOOT SURGERY     Per patient, bilateral foot surgery.  [name] PELVIC FRACTURE SURGERY     Per patient for cancer.    Medical History: Past Medical History:  Diagnosis Date  . Arthritis   . Cancer ([place])   . Deep vein thrombosis (DVT) ([place]) 2015  . Depression   . GERD (gastroesophageal reflux disease)   . Hypertension   . Weakness of both legs     Family History: Family  History  Problem Relation Age of Onset  . Depression Sister   . Heart disease Sister   . Early death Sister   . Depression Sister   . Depression Sister   . Depression Sister   . Depression Sister     Social History   Socioeconomic History  . Marital status: Married    Spouse name: Not on file  . Number of children: Not on file  . Years of education: Not on file  . Highest education level: Not on file  Occupational History  . Not on file  Social Needs  . Financial resource strain: Not on file  . Food insecurity    Worry: Not on file    Inability: Not on file  . Transportation needs  Medical: Not on file    Non-medical: Not on file  Tobacco Use  . Smoking status: Never Smoker  . Smokeless tobacco: Never Used  Substance and Sexual Activity  . Alcohol use: No  . Drug use: No  . Sexual activity: Not Currently  Lifestyle  . Physical activity    Days per week: Not on file    Minutes per session: Not on file  . Stress: Not on file  Relationships  . Social Herbalist on phone: Not on file    Gets together: Not on file    Attends religious service: Not on file    Active member of club or organization: Not on file    Attends meetings of clubs or organizations: Not on file    Relationship status: Not on file  . Intimate partner violence    Fear of current or ex partner: Not on file    Emotionally abused: Not on file    Physically abused: Not on file    Forced sexual activity: Not on file  Other Topics Concern  . Not on file  Social History Narrative  . Not on file      Review of Systems  Constitutional: Positive for appetite change, chills, diaphoresis and fatigue. Negative for unexpected weight change.       Decreased appetite  HENT: Positive for congestion and postnasal drip. Negative for rhinorrhea, sneezing and sore throat.   Respiratory: Positive for cough, shortness of breath and wheezing. Negative for chest tightness.        Worse with  exertion. Has been using nebulizer up to four times daily.   Cardiovascular: Negative for chest pain and palpitations.  Gastrointestinal: Positive for nausea. Negative for abdominal pain, constipation, diarrhea and vomiting.  Musculoskeletal: Positive for arthralgias and myalgias. Negative for back pain, joint swelling and neck pain.  Skin: Negative for rash.  Neurological: Positive for weakness. Negative for tremors and numbness.  Hematological: Negative for adenopathy. Does not bruise/bleed easily.  Psychiatric/Behavioral: Positive for dysphoric mood. Negative for behavioral problems (Depression), sleep disturbance and suicidal ideas. The patient is not nervous/anxious.     Today's Vitals   01/27/19 0827  Weight: 250 lb (113.4 kg)  Height: 5' (1.524 m)   Body mass index is 48.82 kg/m.  Observation/Objective:   The patient is alert and oriented. She is pleasant and answers all questions appropriately. Breathing is non-labored. She is in no acute distress at this time. The patient sounds very congested and short of breath while walking around her home. She looks fatigued and as though she does not feel well.   Assessment/Plan: 1. Acute upper respiratory infection D/c augmentin. Start cipro 500mg  twice daily for 10 days. Get chest x-ray for further evaluation. Will also send patient for COVID 19 testing. Advised her to rest as needed and increase fluid intake.  - DG Chest 2 View; Future - Novel Coronavirus, NAA ([facility]) - ciprofloxacin (CIPRO) 500 MG tablet; Take 1 tablet (500 mg total) by mouth 2 (two) times daily.  Dispense: 20 tablet; Refill: 0  2. Exposure to Covid-19 Virus - Novel Coronavirus, NAA ([facility])  3. Shortness of breath Chest x-ray ordered for further evaluation.  - DG Chest 2 View; Future  4. Chronic systolic heart failure [facility]) May need to see cardiologist for further evaluation.   General Counseling: Addisynn verbalizes understanding of the findings of today's  phone visit and agrees with plan of treatment. I have discussed any further diagnostic evaluation that  may be needed or ordered today. We also reviewed her medications today. she has been encouraged to call the office with any questions or concerns that should arise related to todays visit.  Rest and increase fluids. Continue using OTC medication to control symptoms.   This patient was seen by Leretha Pol FNP Collaboration with Dr Lavera Guise as a part of collaborative care agreement  Orders Placed This Encounter  Procedures  . Novel Coronavirus, NAA ([facility])  . DG Chest 2 View    Meds ordered this encounter  Medications  . ciprofloxacin (CIPRO) 500 MG tablet    Sig: Take 1 tablet (500 mg total) by mouth 2 (two) times daily.    Dispense:  20 tablet    Refill:  0    Please d/c augmentin. Has been ineffective for this.    Order Specific Question:   Supervising Provider    Answer:   Lavera Guise [id number]    Time spent: 11 Minutes    Dr Lavera Guise Internal medicine

## 2019-01-28 ENCOUNTER — Telehealth: Payer: Self-pay

## 2019-01-28 LAB — NOVEL CORONAVIRUS, NAA: SARS-CoV-2, NAA: NOT DETECTED

## 2019-01-28 NOTE — Telephone Encounter (Signed)
Per Diana Bernard let the patient know that her chest x-ray is clear. No acute abnormalities or evidence of infection

## 2019-01-28 NOTE — Progress Notes (Signed)
Please let the patient know that her chest x-ray is clear. No acute abnormalities or evidence of infection. thanks

## 2019-01-29 ENCOUNTER — Telehealth: Payer: Self-pay

## 2019-01-29 NOTE — Progress Notes (Signed)
Please let the patient know that covid 19 testing is negative. Thanks.

## 2019-01-29 NOTE — Telephone Encounter (Signed)
Per Nira Conn called pt and notified her that her covid test was negative.

## 2019-01-30 DIAGNOSIS — I428 Other cardiomyopathies: Secondary | ICD-10-CM | POA: Diagnosis not present

## 2019-01-30 DIAGNOSIS — Z79899 Other long term (current) drug therapy: Secondary | ICD-10-CM | POA: Diagnosis not present

## 2019-01-30 DIAGNOSIS — R0602 Shortness of breath: Secondary | ICD-10-CM | POA: Diagnosis not present

## 2019-01-30 DIAGNOSIS — I429 Cardiomyopathy, unspecified: Secondary | ICD-10-CM | POA: Diagnosis not present

## 2019-01-30 DIAGNOSIS — I420 Dilated cardiomyopathy: Secondary | ICD-10-CM | POA: Diagnosis not present

## 2019-01-30 DIAGNOSIS — R5383 Other fatigue: Secondary | ICD-10-CM | POA: Diagnosis not present

## 2019-01-30 DIAGNOSIS — Z95810 Presence of automatic (implantable) cardiac defibrillator: Secondary | ICD-10-CM | POA: Diagnosis not present

## 2019-01-30 DIAGNOSIS — Z6841 Body Mass Index (BMI) 40.0 and over, adult: Secondary | ICD-10-CM | POA: Diagnosis not present

## 2019-01-30 DIAGNOSIS — I110 Hypertensive heart disease with heart failure: Secondary | ICD-10-CM | POA: Diagnosis not present

## 2019-01-30 DIAGNOSIS — Z4502 Encounter for adjustment and management of automatic implantable cardiac defibrillator: Secondary | ICD-10-CM | POA: Diagnosis not present

## 2019-01-30 DIAGNOSIS — I5022 Chronic systolic (congestive) heart failure: Secondary | ICD-10-CM | POA: Diagnosis not present

## 2019-02-05 DIAGNOSIS — G4733 Obstructive sleep apnea (adult) (pediatric): Secondary | ICD-10-CM | POA: Diagnosis not present

## 2019-02-06 DIAGNOSIS — Z4502 Encounter for adjustment and management of automatic implantable cardiac defibrillator: Secondary | ICD-10-CM | POA: Diagnosis not present

## 2019-02-09 DIAGNOSIS — R790 Abnormal level of blood mineral: Secondary | ICD-10-CM | POA: Diagnosis not present

## 2019-02-09 DIAGNOSIS — G629 Polyneuropathy, unspecified: Secondary | ICD-10-CM | POA: Diagnosis not present

## 2019-02-09 DIAGNOSIS — Z5111 Encounter for antineoplastic chemotherapy: Secondary | ICD-10-CM | POA: Diagnosis not present

## 2019-02-09 DIAGNOSIS — M533 Sacrococcygeal disorders, not elsewhere classified: Secondary | ICD-10-CM | POA: Diagnosis not present

## 2019-02-09 DIAGNOSIS — C541 Malignant neoplasm of endometrium: Secondary | ICD-10-CM | POA: Diagnosis not present

## 2019-02-09 DIAGNOSIS — R0600 Dyspnea, unspecified: Secondary | ICD-10-CM | POA: Diagnosis not present

## 2019-02-09 DIAGNOSIS — R2689 Other abnormalities of gait and mobility: Secondary | ICD-10-CM | POA: Diagnosis not present

## 2019-03-05 DIAGNOSIS — I428 Other cardiomyopathies: Secondary | ICD-10-CM | POA: Diagnosis not present

## 2019-03-05 DIAGNOSIS — I110 Hypertensive heart disease with heart failure: Secondary | ICD-10-CM | POA: Diagnosis not present

## 2019-03-05 DIAGNOSIS — I2510 Atherosclerotic heart disease of native coronary artery without angina pectoris: Secondary | ICD-10-CM | POA: Diagnosis not present

## 2019-03-05 DIAGNOSIS — I5022 Chronic systolic (congestive) heart failure: Secondary | ICD-10-CM | POA: Diagnosis not present

## 2019-03-05 DIAGNOSIS — Z8542 Personal history of malignant neoplasm of other parts of uterus: Secondary | ICD-10-CM | POA: Diagnosis not present

## 2019-03-05 DIAGNOSIS — Z95810 Presence of automatic (implantable) cardiac defibrillator: Secondary | ICD-10-CM | POA: Diagnosis not present

## 2019-03-05 DIAGNOSIS — I447 Left bundle-branch block, unspecified: Secondary | ICD-10-CM | POA: Diagnosis not present

## 2019-03-05 DIAGNOSIS — R0602 Shortness of breath: Secondary | ICD-10-CM | POA: Diagnosis not present

## 2019-03-05 DIAGNOSIS — F419 Anxiety disorder, unspecified: Secondary | ICD-10-CM | POA: Diagnosis not present

## 2019-03-05 DIAGNOSIS — F329 Major depressive disorder, single episode, unspecified: Secondary | ICD-10-CM | POA: Diagnosis not present

## 2019-03-11 DIAGNOSIS — I340 Nonrheumatic mitral (valve) insufficiency: Secondary | ICD-10-CM | POA: Diagnosis not present

## 2019-03-11 DIAGNOSIS — R0602 Shortness of breath: Secondary | ICD-10-CM | POA: Diagnosis not present

## 2019-03-11 DIAGNOSIS — I5020 Unspecified systolic (congestive) heart failure: Secondary | ICD-10-CM | POA: Diagnosis not present

## 2019-03-11 DIAGNOSIS — I361 Nonrheumatic tricuspid (valve) insufficiency: Secondary | ICD-10-CM | POA: Diagnosis not present

## 2019-03-11 DIAGNOSIS — I5022 Chronic systolic (congestive) heart failure: Secondary | ICD-10-CM | POA: Diagnosis not present

## 2019-03-11 DIAGNOSIS — I517 Cardiomegaly: Secondary | ICD-10-CM | POA: Diagnosis not present

## 2019-03-18 ENCOUNTER — Telehealth: Payer: Self-pay

## 2019-03-18 NOTE — Telephone Encounter (Signed)
LMOM TO CONFIRM AND SCREEN FOR 03-20-19 OV.

## 2019-03-18 NOTE — Telephone Encounter (Signed)
PT WILL CALL BACK TO RESCHEDULE HER MEDICARE WELLNESS EXAM .TAT

## 2019-03-20 ENCOUNTER — Ambulatory Visit: Payer: Medicare HMO | Admitting: Nurse Practitioner

## 2019-03-23 ENCOUNTER — Telehealth: Payer: Self-pay

## 2019-03-23 NOTE — Telephone Encounter (Signed)
Confirmed appointment with patient. klh °

## 2019-03-25 ENCOUNTER — Ambulatory Visit (INDEPENDENT_AMBULATORY_CARE_PROVIDER_SITE_OTHER): Payer: Medicare HMO

## 2019-03-25 ENCOUNTER — Other Ambulatory Visit: Payer: Self-pay

## 2019-03-25 DIAGNOSIS — G4733 Obstructive sleep apnea (adult) (pediatric): Secondary | ICD-10-CM | POA: Diagnosis not present

## 2019-03-25 NOTE — Progress Notes (Signed)
95 percentile pressure 10   95th percentile leak 48.3   apnea index 0.5 /hr  apnea-hypopnea index  0.8 /hr   total days used  >4 hr 82 days  total days used <4 hr 6 days  Total compliance 91 percent  No problems or questions at this time

## 2019-04-06 ENCOUNTER — Ambulatory Visit (INDEPENDENT_AMBULATORY_CARE_PROVIDER_SITE_OTHER): Payer: Medicare HMO | Admitting: Nurse Practitioner

## 2019-04-06 ENCOUNTER — Other Ambulatory Visit: Payer: Self-pay

## 2019-04-06 ENCOUNTER — Encounter: Payer: Self-pay | Admitting: Nurse Practitioner

## 2019-04-06 VITALS — BP 132/69 | HR 65 | Ht 60.0 in | Wt 240.0 lb

## 2019-04-06 DIAGNOSIS — R29818 Other symptoms and signs involving the nervous system: Secondary | ICD-10-CM | POA: Diagnosis not present

## 2019-04-06 DIAGNOSIS — R413 Other amnesia: Secondary | ICD-10-CM | POA: Diagnosis not present

## 2019-04-06 DIAGNOSIS — I10 Essential (primary) hypertension: Secondary | ICD-10-CM

## 2019-04-06 NOTE — Progress Notes (Signed)
[facility] [place], [place] [address]  Internal MEDICINE  Telephone Visit  Patient Name: Diana Bernard  [id number]  [id number]  Date of Service: 04/06/2019  I connected with the patient at 11:20am by telephone and verified the patients identity using two identifiers.   I discussed the limitations, risks, security and privacy concerns of performing an evaluation and management service by telephone and the availability of in person appointments. I also discussed with the patient that there may be a patient responsible charge related to the service.  The patient expressed understanding and agrees to proceed.    Chief Complaint  Patient presents with  . Telephone Assessment  . Telephone Screen  . Memory Loss    The patient has been contacted via telephone for follow up visit due to concerns for spread of novel coronavirus. The patient states that she went to [place] a couple of months ago and got lost going to the visit. She states that, especially when she gets tired, she has trouble finishing her sentences, forgets what she is going to say and what she is doing or where she is going. She states that she will go somewhere and not recognize anything, then it comes back to her as quickly as she recognizes there is an issue. She states that her mother had alzheimer's dementia and this got progressively worse until she passed away.       Current Medication: Outpatient Encounter Medications as of 04/06/2019  Medication Sig Note  . amLODipine (NORVASC) 10 MG tablet Take 1 tab po daily   . amoxicillin-clavulanate (AUGMENTIN) 875-125 MG tablet Take 1 tablet by mouth 2 (two) times daily.   [name] aspirin EC 325 MG tablet Take 325 mg by mouth. 06/16/2015: Takes 1 tablet everyday.   . cholecalciferol (VITAMIN D) 1000 units tablet Take 1,000 Units by mouth daily.   . cilostazol (PLETAL) 50 MG tablet Take 1 tablet (50 mg total) by mouth 2 (two) times daily.   .  ciprofloxacin (CIPRO) 500 MG tablet Take 1 tablet (500 mg total) by mouth 2 (two) times daily.   . CYANOCOBALAMIN IJ Inject as directed.   . dicyclomine (BENTYL) 10 MG capsule Take 1 capsule po TID prn intestinal cramping/diarrhea   . diphenoxylate-atropine (LOMOTIL) 2.5-0.025 MG tablet Take 1 tablet by mouth 4 (four) times daily as needed for diarrhea or loose stools.   . docusate sodium (STOOL SOFTENER) 100 MG capsule Take 200 mg by mouth daily.  06/07/2015: Received from: [facility]  . DULoxetine (CYMBALTA) 30 MG capsule Take 1 capsule po bid   . esomeprazole (NEXIUM) 20 MG capsule Take 20 mg by mouth daily at 12 noon.   [name] ESOMEPRAZOLE MAGNESIUM PO Take 40 mg by mouth.   . furosemide (LASIX) 20 MG tablet Take 1 tablet (20 mg total) by mouth daily.   [name] gabapentin (NEURONTIN) 300 MG capsule Take 2 cap twice a daily for neuropathy   . hydrochlorothiazide (HYDRODIURIL) 25 MG tablet Take 25 mg by mouth daily.   [name] HYDROcodone-acetaminophen (NORCO/VICODIN) 5-325 MG tablet Take 1 tablet by mouth every 6 (six) hours as needed for moderate pain.   [name] ibuprofen (ADVIL,MOTRIN) 800 MG tablet Take 800 mg by mouth. Reported on 06/22/2015 06/16/2015: Can take up to 3 tablets per day as needed.   [name] ipratropium-albuterol (DUONEB) 0.5-2.5 (3) MG/3ML SOLN Take 3 mLs by nebulization every 4 (four) hours as needed.   . Lidocaine HCl 3 % GEL Reported on 06/22/2015  06/07/2015: Received from: [facility]  . lisinopril (PRINIVIL,ZESTRIL) 10 MG tablet Take 1 tablet (10 mg total) by mouth daily. (Patient taking differently: Take 20 mg by mouth daily. )   . lisinopril (PRINIVIL,ZESTRIL) 20 MG tablet Take 1 tablet by mouth.   . Magnesium 250 MG TABS Take by mouth. 06/22/2015: Reports taking once a day  . metoprolol succinate (TOPROL-XL) 25 MG 24 hr tablet Take 12.5 mg by mouth.   . ondansetron (ZOFRAN) 8 MG tablet Take 1 tablet (8 mg total) by mouth every 8 (eight) hours as needed for nausea or vomiting.   [name] oxybutynin  (DITROPAN-XL) 5 MG 24 hr tablet Take 1 tablet (5 mg total) by mouth 2 (two) times daily.   . phenazopyridine (PYRIDIUM) 200 MG tablet Take 1 tablet (200 mg total) by mouth 3 (three) times daily as needed for pain.   [name] Potassium 99 MG TABS Take by mouth. 06/16/2015: Takes 2 tablets everyday.  [name] spironolactone (ALDACTONE) 25 MG tablet    . SPS 15 GM/60ML suspension    . traMADol (ULTRAM) 50 MG tablet Take 1 tablet (50 mg total) by mouth 2 (two) times daily. One tab po bid prn for pain    No facility-administered encounter medications on file as of 04/06/2019.     Surgical History: Past Surgical History:  Procedure Laterality Date  . ABCESS DRAINAGE  2016   Abdominal abcess due to diverticulitis  . caridoverter defibrillator  11/14/2017   ICD  . CATARACT EXTRACTION W/PHACO Left 10/26/2015   Procedure: CATARACT EXTRACTION PHACO AND INTRAOCULAR LENS PLACEMENT (IOC) LEFT EYE;  Surgeon: Leandrew Koyanagi, MD;  Location: [facility];  Service: Ophthalmology;  Laterality: Left;  . CATARACT EXTRACTION W/PHACO Right 12/07/2015   Procedure: CATARACT EXTRACTION PHACO AND INTRAOCULAR LENS PLACEMENT (IOC);  Surgeon: Leandrew Koyanagi, MD;  Location: [facility];  Service: Ophthalmology;  Laterality: Right;  . CHOLECYSTECTOMY    . FOOT SURGERY     Per patient, bilateral foot surgery.  [name] PELVIC FRACTURE SURGERY     Per patient for cancer.    Medical History: Past Medical History:  Diagnosis Date  . Arthritis   . Cancer ([place])   . Deep vein thrombosis (DVT) ([place]) 2015  . Depression   . GERD (gastroesophageal reflux disease)   . Hypertension   . Weakness of both legs     Family History: Family History  Problem Relation Age of Onset  . Depression Sister   . Heart disease Sister   . Early death Sister   . Depression Sister   . Depression Sister   . Depression Sister   . Depression Sister     Social History   Socioeconomic History  . Marital status: Married    Spouse  name: Not on file  . Number of children: Not on file  . Years of education: Not on file  . Highest education level: Not on file  Occupational History  . Not on file  Social Needs  . Financial resource strain: Not on file  . Food insecurity    Worry: Not on file    Inability: Not on file  . Transportation needs    Medical: Not on file    Non-medical: Not on file  Tobacco Use  . Smoking status: Never Smoker  . Smokeless tobacco: Never Used  Substance and Sexual Activity  . Alcohol use: No  . Drug use: No  . Sexual activity: Not Currently  Lifestyle  . Physical activity  Days per week: Not on file    Minutes per session: Not on file  . Stress: Not on file  Relationships  . Social Herbalist on phone: Not on file    Gets together: Not on file    Attends religious service: Not on file    Active member of club or organization: Not on file    Attends meetings of clubs or organizations: Not on file    Relationship status: Not on file  . Intimate partner violence    Fear of current or ex partner: Not on file    Emotionally abused: Not on file    Physically abused: Not on file    Forced sexual activity: Not on file  Other Topics Concern  . Not on file  Social History Narrative  . Not on file      Review of Systems  Constitutional: Negative for activity change, chills, fatigue and unexpected weight change.  HENT: Negative for congestion, postnasal drip, rhinorrhea, sneezing and sore throat.   Respiratory: Negative for cough, chest tightness and shortness of breath.   Cardiovascular: Negative for chest pain and palpitations.  Gastrointestinal: Negative for abdominal pain, constipation, diarrhea, nausea and vomiting.  Genitourinary: Negative for dysuria and frequency.  Musculoskeletal: Negative for arthralgias, back pain, joint swelling and neck pain.  Skin: Negative for rash.  Allergic/Immunologic: Negative for environmental allergies.  Neurological: Negative  for dizziness, tremors, numbness and headaches.       Memory issues.   Hematological: Negative for adenopathy. Does not bruise/bleed easily.  Psychiatric/Behavioral: Negative for behavioral problems (Depression), sleep disturbance and suicidal ideas. The patient is nervous/anxious.     Today's Vitals   04/06/19 1114  BP: 132/69  Pulse: 65  Weight: 240 lb (108.9 kg)  Height: 5' (1.524 m)   Body mass index is 46.87 kg/m.   Observation/Objective:   The patient is alert and oriented. She is pleasant and answers all questions appropriately. Breathing is non-labored. She is in no acute distress at this time. The patient is worried.    Assessment/Plan:  1. Other symptoms and signs involving the nervous system Progressive memory loss over past few months with family history of alzheimer's dementia. Will get CT of the head and lab work to be ordered for further evaluation.  - CT Head Wo Contrast; Future  2. Memory loss Progressive memory loss over past few months with family history of alzheimer's dementia. Will get CT of the head and lab work to be ordered for further evaluation.  - CT Head Wo Contrast; Future  3. Essential hypertension Stable. Continue bp medication as prescribed   General Counseling: Sharmayne verbalizes understanding of the findings of today's phone visit and agrees with plan of treatment. I have discussed any further diagnostic evaluation that may be needed or ordered today. We also reviewed her medications today. she has been encouraged to call the office with any questions or concerns that should arise related to todays visit.  This patient was seen by Leretha Pol FNP Collaboration with Dr Lavera Guise as a part of collaborative care agreement  Orders Placed This Encounter  Procedures  . CT Head Wo Contrast     Time spent: 25 Minutes    Dr Lavera Guise Internal medicine

## 2019-04-07 ENCOUNTER — Telehealth: Payer: Self-pay

## 2019-04-07 NOTE — Telephone Encounter (Signed)
Confirmed appointment with patient. klh °

## 2019-04-07 NOTE — Telephone Encounter (Signed)
Called lmom informing patient of appointment. klh 

## 2019-04-09 ENCOUNTER — Ambulatory Visit: Payer: Medicare HMO | Admitting: Internal Medicine

## 2019-04-09 ENCOUNTER — Other Ambulatory Visit: Payer: Self-pay

## 2019-04-09 ENCOUNTER — Encounter: Payer: Self-pay | Admitting: Internal Medicine

## 2019-04-09 VITALS — BP 139/73 | HR 67 | Temp 97.5°F | Resp 16 | Ht 60.0 in | Wt 255.0 lb

## 2019-04-09 DIAGNOSIS — J42 Unspecified chronic bronchitis: Secondary | ICD-10-CM | POA: Diagnosis not present

## 2019-04-09 DIAGNOSIS — R918 Other nonspecific abnormal finding of lung field: Secondary | ICD-10-CM

## 2019-04-09 DIAGNOSIS — I5022 Chronic systolic (congestive) heart failure: Secondary | ICD-10-CM

## 2019-04-09 DIAGNOSIS — G4733 Obstructive sleep apnea (adult) (pediatric): Secondary | ICD-10-CM | POA: Diagnosis not present

## 2019-04-09 DIAGNOSIS — R0602 Shortness of breath: Secondary | ICD-10-CM | POA: Diagnosis not present

## 2019-04-09 MED ORDER — BUDESONIDE-FORMOTEROL FUMARATE 160-4.5 MCG/ACT IN AERO: 2.0000 | INHALATION_SPRAY | Freq: Two times a day (BID) | RESPIRATORY_TRACT | 3 refills | Status: DC

## 2019-04-09 NOTE — Progress Notes (Signed)
[facility] [place], [place] [address]  Pulmonary Sleep Medicine   Office Visit Note  Patient Name: Diana Bernard DOB: 1946/03/13 MRN [id number]  Date of Service: 04/09/2019  Complaints/HPI: Pt is here for pulmonary follow up. She has a history of OSA and chronic bronchitis.  She reports that she has been using her Symbicort intermittently.  This works for her when she takes it.  She does continue to reports some intermittent DOE.  Pt reports good compliance with CPAP therapy. Cleaning machine by hand, and changing filters and tubing as directed. Denies headaches, sinus issues, palpitations, or hemoptysis.    ROS  General: (-) fever, (-) chills, (-) night sweats, (-) weakness Skin: (-) rashes, (-) itching,. Eyes: (-) visual changes, (-) redness, (-) itching. Nose and Sinuses: (-) nasal stuffiness or itchiness, (-) postnasal drip, (-) nosebleeds, (-) sinus trouble. Mouth and Throat: (-) sore throat, (-) hoarseness. Neck: (-) swollen glands, (-) enlarged thyroid, (-) neck pain. Respiratory: - cough, (-) bloody sputum, - shortness of breath, - wheezing. Cardiovascular: - ankle swelling, (-) chest pain. Lymphatic: (-) lymph node enlargement. Neurologic: (-) numbness, (-) tingling. Psychiatric: (-) anxiety, (-) depression   Current Medication: Outpatient Encounter Medications as of 04/09/2019  Medication Sig Note  . amLODipine (NORVASC) 10 MG tablet Take 1 tab po daily   . aspirin EC 325 MG tablet Take 325 mg by mouth. 06/16/2015: Takes 1 tablet everyday.   . cholecalciferol (VITAMIN D) 1000 units tablet Take 1,000 Units by mouth daily.   . cilostazol (PLETAL) 50 MG tablet Take 1 tablet (50 mg total) by mouth 2 (two) times daily.   . CYANOCOBALAMIN IJ Inject as directed.   . dicyclomine (BENTYL) 10 MG capsule Take 1 capsule po TID prn intestinal cramping/diarrhea   . diphenoxylate-atropine (LOMOTIL) 2.5-0.025 MG tablet Take 1 tablet by mouth 4  (four) times daily as needed for diarrhea or loose stools.   . docusate sodium (STOOL SOFTENER) 100 MG capsule Take 200 mg by mouth daily.  06/07/2015: Received from: [facility]  . DULoxetine (CYMBALTA) 30 MG capsule Take 1 capsule po bid   . esomeprazole (NEXIUM) 20 MG capsule Take 20 mg by mouth daily at 12 noon.   [name] ESOMEPRAZOLE MAGNESIUM PO Take 40 mg by mouth.   . furosemide (LASIX) 20 MG tablet Take 1 tablet (20 mg total) by mouth daily.   [name] gabapentin (NEURONTIN) 300 MG capsule Take 2 cap twice a daily for neuropathy   . hydrochlorothiazide (HYDRODIURIL) 25 MG tablet Take 25 mg by mouth daily.   [name] HYDROcodone-acetaminophen (NORCO/VICODIN) 5-325 MG tablet Take 1 tablet by mouth every 6 (six) hours as needed for moderate pain.   [name] ibuprofen (ADVIL,MOTRIN) 800 MG tablet Take 800 mg by mouth. Reported on 06/22/2015 06/16/2015: Can take up to 3 tablets per day as needed.   [name] ipratropium-albuterol (DUONEB) 0.5-2.5 (3) MG/3ML SOLN Take 3 mLs by nebulization every 4 (four) hours as needed.   . Lidocaine HCl 3 % GEL Reported on 06/22/2015 06/07/2015: [facility]ceived from: [facility]  . lisinopril (PRINIVIL,ZESTRIL) 10 MG tablet Take 1 tablet (10 mg total) by mouth daily. (Patient taking differently: Take 20 mg by mouth daily. )   . lisinopril (PRINIVIL,ZESTRIL) 20 MG tablet Take 1 tablet by mouth.   . Magnesium 250 MG TABS Take by mouth. 06/22/2015: Reports taking once a day  . metoprolol succinate (TOPROL-XL) 25 MG 24 hr tablet Take 12.5 mg by mouth.   [name]  ondansetron (ZOFRAN) 8 MG tablet Take 1 tablet (8 mg total) by mouth every 8 (eight) hours as needed for nausea or vomiting.   [name] oxybutynin (DITROPAN-XL) 5 MG 24 hr tablet Take 1 tablet (5 mg total) by mouth 2 (two) times daily.   . phenazopyridine (PYRIDIUM) 200 MG tablet Take 1 tablet (200 mg total) by mouth 3 (three) times daily as needed for pain.   [name] Potassium 99 MG TABS Take by mouth. 06/16/2015: Takes 2 tablets everyday.  [name] spironolactone  (ALDACTONE) 25 MG tablet    . SPS 15 GM/60ML suspension    . traMADol (ULTRAM) 50 MG tablet Take 1 tablet (50 mg total) by mouth 2 (two) times daily. One tab po bid prn for pain   . [DISCONTINUED] amoxicillin-clavulanate (AUGMENTIN) 875-125 MG tablet Take 1 tablet by mouth 2 (two) times daily. (Patient not taking: Reported on 04/09/2019)   . [DISCONTINUED] ciprofloxacin (CIPRO) 500 MG tablet Take 1 tablet (500 mg total) by mouth 2 (two) times daily. (Patient not taking: Reported on 04/09/2019)    No facility-administered encounter medications on file as of 04/09/2019.    Surgical History: Past Surgical History:  Procedure Laterality Date  . ABCESS DRAINAGE  2016   Abdominal abcess due to diverticulitis  . caridoverter defibrillator  11/14/2017   ICD  . CATARACT EXTRACTION W/PHACO Left 10/26/2015   Procedure: CATARACT EXTRACTION PHACO AND INTRAOCULAR LENS PLACEMENT (IOC) LEFT EYE;  Surgeon: Leandrew Koyanagi, MD;  Location: [facility];  Service: Ophthalmology;  Laterality: Left;  . CATARACT EXTRACTION W/PHACO Right 12/07/2015   Procedure: CATARACT EXTRACTION PHACO AND INTRAOCULAR LENS PLACEMENT (IOC);  Surgeon: Leandrew Koyanagi, MD;  Location: [facility];  Service: Ophthalmology;  Laterality: Right;  . CHOLECYSTECTOMY    . FOOT SURGERY     Per patient, bilateral foot surgery.  [name] PELVIC FRACTURE SURGERY     Per patient for cancer.    Medical History: Past Medical History:  Diagnosis Date  . Arthritis   . Cancer ([place])   . Deep vein thrombosis (DVT) ([place]) 2015  . Depression   . GERD (gastroesophageal reflux disease)   . Hypertension   . Weakness of both legs     Family History: Family History  Problem Relation Age of Onset  . Depression Sister   . Heart disease Sister   . Early death Sister   . Depression Sister   . Depression Sister   . Depression Sister   . Depression Sister     Social History: Social History   Socioeconomic History  . Marital  status: Married    Spouse name: Not on file  . Number of children: Not on file  . Years of education: Not on file  . Highest education level: Not on file  Occupational History  . Not on file  Tobacco Use  . Smoking status: Never Smoker  . Smokeless tobacco: Never Used  Substance and Sexual Activity  . Alcohol use: No  . Drug use: No  . Sexual activity: Not Currently  Other Topics Concern  . Not on file  Social History Narrative  . Not on file   Social Determinants of Health   Financial Resource Strain:   . Difficulty of Paying Living Expenses: Not on file  Food Insecurity:   . Worried About Charity fundraiser in the Last Year: Not on file  . Ran Out of Food in the Last Year: Not on file  Transportation Needs:   . Lack of Transportation (  Medical): Not on file  . Lack of Transportation (Non-Medical): Not on file  Physical Activity:   . Days of Exercise per Week: Not on file  . Minutes of Exercise per Session: Not on file  Stress:   . Feeling of Stress : Not on file  Social Connections:   . Frequency of Communication with Friends and Family: Not on file  . Frequency of Social Gatherings with Friends and Family: Not on file  . Attends Religious Services: Not on file  . Active Member of Clubs or Organizations: Not on file  . Attends Archivist Meetings: Not on file  . Marital Status: Not on file  Intimate Partner Violence:   . Fear of Current or Ex-Partner: Not on file  . Emotionally Abused: Not on file  . Physically Abused: Not on file  . Sexually Abused: Not on file    Vital Signs: Blood pressure 139/73, pulse 67, temperature (!) 97.5 F (36.4 C), resp. rate 16, height 5' (1.524 m), weight 255 lb (115.7 kg), SpO2 95 %.  Examination: General Appearance: The patient is well-developed, well-nourished, and in no distress. Skin: Gross inspection of skin unremarkable. Head: normocephalic, no gross deformities. Eyes: no gross deformities noted. ENT: ears  appear grossly normal no exudates. Neck: Supple. No thyromegaly. No LAD. Respiratory: clear bilaterally. Cardiovascular: Normal S1 and S2 without murmur or rub. Extremities: No cyanosis. pulses are equal. Neurologic: Alert and oriented. No involuntary movements.  LABS: Recent Results (from the past 2160 hour(s))  Novel Coronavirus, NAA ([facility])     Status: None   Collection Time: 01/27/19 12:00 AM   Specimen: Oropharyngeal(OP) collection in vial transport medium   OROPHARYNGEA  TESTING  Result Value Ref Range   SARS-CoV-2, NAA Not Detected Not Detected    Comment: This nucleic acid amplification test was developed and its performance characteristics determined by [employer]. Nucleic acid amplification tests include PCR and TMA. This test has not been FDA cleared or approved. This test has been authorized by FDA under an Emergency Use Authorization (EUA). This test is only authorized for the duration of time the declaration that circumstances exist justifying the authorization of the emergency use of in vitro diagnostic tests for detection of SARS-CoV-2 virus and/or diagnosis of COVID-19 infection under section 564(b)(1) of the Act, 21 U.S.C. [id number]) (1), unless the authorization is terminated or revoked sooner. When diagnostic testing is negative, the possibility of a false negative result should be considered in the context of a patient's recent exposures and the presence of clinical signs and symptoms consistent with COVID-19. An individual without symptoms of COVID-19 and who is not shedding SARS-CoV-2 virus would  expect to have a negative (not detected) result in this assay.     Radiology: DG Chest 2 View  Result Date: 01/27/2019 CLINICAL DATA:  Shortness of breath EXAM: CHEST - 2 VIEW COMPARISON:  06/05/2017 FINDINGS: Cardiac shadow is stable. Defibrillator is now seen. No focal infiltrate or sizable effusion is seen. No bony abnormality is noted.  IMPRESSION: No acute abnormality seen. Electronically Signed   By: Inez Catalina M.D.   On: 01/27/2019 13:31    No results found.  No results found.    Assessment and Plan: Patient Active Problem List   Diagnosis Date Noted  . Other symptoms and signs involving the nervous system 04/06/2019  . Memory loss 04/06/2019  . Acute upper respiratory infection 01/27/2019  . Exposure to COVID-19 virus 01/27/2019  . Intermittent claudication ([place]) 11/01/2018  . Moderate  major depression ([place]) 09/22/2018  . Peripheral arterial disease ([place]) 06/25/2018  . Diverticulitis of large intestine without perforation or abscess without bleeding 05/29/2018  . Other fatigue 03/20/2018  . Dysuria 03/20/2018  . Gastroenteritis, acute 01/26/2018  . Diarrhea 01/26/2018  . Nausea 01/26/2018  . Chronic systolic heart failure ([place]) 01/26/2018  . Cough variant asthma 12/07/2017  . Need for vaccination against Streptococcus pneumoniae using pneumococcal conjugate vaccine 13 12/07/2017  . AICD lead displacement 11/20/2017  . Cardiac resynchronization therapy defibrillator (CRT-D) in place 11/14/2017  . Dilated cardiomyopathy ([place]) 09/04/2017  . Left bundle branch block 09/04/2017  . OSA (obstructive sleep apnea) 07/31/2017  . Abscess of female pelvis 05/07/2014  . Acute deep vein thrombosis (DVT) of femoral vein of left lower extremity ([place]) 03/29/2014  . Lumbago-sciatica due to displacement of lumbar intervertebral disc 12/28/2013  . Chronic bilateral low back pain with bilateral sciatica 12/28/2013  . Neuropathy due to chemotherapeutic drug ([place]) 11/02/2013  . Encounter for general adult medical examination with abnormal findings 06/01/2013  . Morbid obesity ([place]) 04/20/2013  . Essential hypertension 04/13/2013  . Malignant neoplasm of endometrium ([place]) 03/30/2013  . Shortness of breath 03/30/2013  . CMC arthritis, thumb, degenerative 01/02/2013    1. Obstructive sleep apnea Stable, continue to be  compliant with CPAP as discussed.  Pt reports good symptom relief.   2. Chronic bronchitis, unspecified chronic bronchitis type ([place]) Encouraged patient to use symbicort daily as prescribed. Will get PFT, and follow up accordingly.  - Pulmonary function test; Future - budesonide-formoterol (SYMBICORT) 160-4.5 MCG/ACT inhaler; Inhale 2 puffs into the lungs 2 (two) times daily.  Dispense: 1 Inhaler; Refill: 3  3. Chronic systolic heart failure ([place]) Continue to follow up with cardiology.  4. Pulmonary nodules PT needs follow up Chest CT from a 2015 study that showed some nodules.  A 3 month follow up was recommend, and the patient does not recall have a second study done.  I do not see any Chest CT's after that time.  Will scheduled Ct.  - CT Chest Wo Contrast; Future  5. Shortness of breath - CT Chest Wo Contrast; Future  General Counseling: I have discussed the findings of the evaluation and examination with Larra.  I have also discussed any further diagnostic evaluation thatmay be needed or ordered today. Lanette verbalizes understanding of the findings of todays visit. We also reviewed her medications today and discussed drug interactions and side effects including but not limited excessive drowsiness and altered mental states. We also discussed that there is always a risk not just to her but also people around her. she has been encouraged to call the office with any questions or concerns that should arise related to todays visit.  No orders of the defined types were placed in this encounter.    Time spent: 25 This patient was seen by Orson Gear AGNP-C in Collaboration with Dr. Devona Konig as a part of collaborative care agreement.   I have personally obtained a history, examined the patient, evaluated laboratory and imaging results, formulated the assessment and plan and placed orders.    Allyne Gee, MD [facility] Pulmonary and Critical Care Sleep medicine

## 2019-04-13 ENCOUNTER — Ambulatory Visit
Admission: RE | Admit: 2019-04-13 | Discharge: 2019-04-13 | Disposition: A | Discharge status: Home / Self Care | Payer: Medicare HMO | Source: Ambulatory Visit | Attending: Nurse Practitioner | Admitting: Nurse Practitioner

## 2019-04-13 ENCOUNTER — Other Ambulatory Visit: Payer: Self-pay | Admitting: Nurse Practitioner

## 2019-04-13 ENCOUNTER — Other Ambulatory Visit: Payer: Self-pay

## 2019-04-13 DIAGNOSIS — R29818 Other symptoms and signs involving the nervous system: Secondary | ICD-10-CM | POA: Diagnosis not present

## 2019-04-13 DIAGNOSIS — I69811 Memory deficit following other cerebrovascular disease: Secondary | ICD-10-CM | POA: Diagnosis not present

## 2019-04-13 DIAGNOSIS — D509 Iron deficiency anemia, unspecified: Secondary | ICD-10-CM | POA: Diagnosis not present

## 2019-04-13 DIAGNOSIS — R413 Other amnesia: Secondary | ICD-10-CM | POA: Insufficient documentation

## 2019-04-13 DIAGNOSIS — I10 Essential (primary) hypertension: Secondary | ICD-10-CM | POA: Diagnosis not present

## 2019-04-13 DIAGNOSIS — R7301 Impaired fasting glucose: Secondary | ICD-10-CM | POA: Diagnosis not present

## 2019-04-13 DIAGNOSIS — D510 Vitamin B12 deficiency anemia due to intrinsic factor deficiency: Secondary | ICD-10-CM | POA: Diagnosis not present

## 2019-04-13 DIAGNOSIS — D649 Anemia, unspecified: Secondary | ICD-10-CM | POA: Diagnosis not present

## 2019-04-13 DIAGNOSIS — E559 Vitamin D deficiency, unspecified: Secondary | ICD-10-CM | POA: Diagnosis not present

## 2019-04-13 NOTE — Progress Notes (Signed)
Overall, this looks good. Chronic slmall vessel disease only. Waiting on lab results. Review with patient 04/22/2019

## 2019-04-14 LAB — B12 AND FOLATE PANEL
Folate: 19.6 ng/mL (ref >3.0)
Vitamin B-12: 300 pg/mL (ref 232–1245)

## 2019-04-14 LAB — COMPREHENSIVE METABOLIC PANEL
ALT: 26 IU/L (ref 0–32)
AST: 35 IU/L (ref 0–40)
Albumin/Globulin Ratio: 1.3 (ref 1.2–2.2)
Albumin: 3.9 g/dL (ref 3.7–4.7)
Alkaline Phosphatase: 79 IU/L (ref 39–117)
BUN/Creatinine Ratio: 15 (ref 12–28)
BUN: 15 mg/dL (ref 8–27)
Bilirubin Total: 0.3 mg/dL (ref 0.0–1.2)
CO2: 26 mmol/L (ref 20–29)
Calcium: 9.6 mg/dL (ref 8.7–10.3)
Chloride: 101 mmol/L (ref 96–106)
Creatinine, Ser: 0.99 mg/dL (ref 0.57–1.00)
GFR calc Af Amer: 66 mL/min/{1.73_m2} (ref >59)
GFR calc non Af Amer: 58 mL/min/{1.73_m2} — ABNORMAL LOW (ref >59)
Globulin, Total: 3 g/dL (ref 1.5–4.5)
Glucose: 89 mg/dL (ref 65–99)
Potassium: 4 mmol/L (ref 3.5–5.2)
Sodium: 140 mmol/L (ref 134–144)
Total Protein: 6.9 g/dL (ref 6.0–8.5)

## 2019-04-14 LAB — T4, FREE: Free T4: 0.74 ng/dL — ABNORMAL LOW (ref 0.82–1.77)

## 2019-04-14 LAB — CBC
Hematocrit: 33.6 % — ABNORMAL LOW (ref 34.0–46.6)
Hemoglobin: 11.4 g/dL (ref 11.1–15.9)
MCH: 30.7 pg (ref 26.6–33.0)
MCHC: 33.9 g/dL (ref 31.5–35.7)
MCV: 91 fL (ref 79–97)
Platelets: 124 10*3/uL — ABNORMAL LOW (ref 150–450)
RBC: 3.71 x10E6/uL — ABNORMAL LOW (ref 3.77–5.28)
RDW: 13.8 % (ref 11.7–15.4)
WBC: 3.9 10*3/uL (ref 3.4–10.8)

## 2019-04-14 LAB — HGB A1C W/O EAG: Hgb A1c MFr Bld: 5.6 % (ref 4.8–5.6)

## 2019-04-14 LAB — FERRITIN: Ferritin: 160 ng/mL — ABNORMAL HIGH (ref 15–150)

## 2019-04-14 LAB — RPR QUALITATIVE: RPR Ser Ql: NONREACTIVE

## 2019-04-14 LAB — TSH: TSH: 2.67 u[IU]/mL (ref 0.450–4.500)

## 2019-04-14 LAB — IRON AND TIBC
Iron Saturation: 35 % (ref 15–55)
Iron: 94 ug/dL (ref 27–139)
Total Iron Binding Capacity: 269 ug/dL (ref 250–450)
UIBC: 175 ug/dL (ref 118–369)

## 2019-04-14 LAB — VITAMIN D 25 HYDROXY (VIT D DEFICIENCY, FRACTURES): Vit D, 25-Hydroxy: 26.8 ng/mL — ABNORMAL LOW (ref 30.0–100.0)

## 2019-04-20 ENCOUNTER — Other Ambulatory Visit: Payer: Self-pay | Admitting: Internal Medicine

## 2019-04-20 ENCOUNTER — Telehealth: Payer: Self-pay

## 2019-04-20 DIAGNOSIS — I10 Essential (primary) hypertension: Secondary | ICD-10-CM

## 2019-04-20 MED ORDER — LISINOPRIL 20 MG PO TABS: 20.0000 mg | ORAL_TABLET | Freq: Every day | ORAL | 2 refills | Status: DC

## 2019-04-20 NOTE — Telephone Encounter (Signed)
Called lmom informing patient of appointment. klh 

## 2019-04-21 ENCOUNTER — Telehealth: Payer: Self-pay

## 2019-04-21 ENCOUNTER — Ambulatory Visit
Admission: RE | Admit: 2019-04-21 | Discharge: 2019-04-21 | Disposition: A | Discharge status: Home / Self Care | Payer: Medicare HMO | Source: Ambulatory Visit | Attending: Adult Health | Admitting: Adult Health

## 2019-04-21 ENCOUNTER — Other Ambulatory Visit: Payer: Self-pay | Admitting: Adult Health

## 2019-04-21 ENCOUNTER — Other Ambulatory Visit: Payer: Self-pay

## 2019-04-21 DIAGNOSIS — R918 Other nonspecific abnormal finding of lung field: Secondary | ICD-10-CM | POA: Diagnosis not present

## 2019-04-21 DIAGNOSIS — R0602 Shortness of breath: Secondary | ICD-10-CM | POA: Diagnosis not present

## 2019-04-21 MED ORDER — LEVOTHYROXINE SODIUM 50 MCG PO TABS: 50.0000 ug | ORAL_TABLET | Freq: Every day | ORAL | 2 refills | Status: DC

## 2019-04-21 NOTE — Telephone Encounter (Signed)
-----   Message from Kendell Bane, NP sent at 04/21/2019  2:05 PM EST ----- RX for synthroid sent to pharmacy.  Bennie Dallas please let her know.

## 2019-04-21 NOTE — Progress Notes (Signed)
Sent RX for synthroid 20mcg daily. [address].

## 2019-04-21 NOTE — Telephone Encounter (Signed)
Called pt to let her know that we called in synthroid because her thyroid (t4) level was a little low.

## 2019-04-22 ENCOUNTER — Ambulatory Visit (INDEPENDENT_AMBULATORY_CARE_PROVIDER_SITE_OTHER): Payer: Medicare HMO | Admitting: Adult Health

## 2019-04-22 ENCOUNTER — Encounter: Payer: Self-pay | Admitting: Adult Health

## 2019-04-22 VITALS — BP 158/78 | HR 72 | Ht 60.0 in | Wt 250.0 lb

## 2019-04-22 DIAGNOSIS — R918 Other nonspecific abnormal finding of lung field: Secondary | ICD-10-CM

## 2019-04-22 DIAGNOSIS — R413 Other amnesia: Secondary | ICD-10-CM

## 2019-04-22 DIAGNOSIS — I10 Essential (primary) hypertension: Secondary | ICD-10-CM

## 2019-04-22 DIAGNOSIS — I700 Atherosclerosis of aorta: Secondary | ICD-10-CM

## 2019-04-22 NOTE — Progress Notes (Signed)
She might need to see hematology

## 2019-04-22 NOTE — Progress Notes (Signed)
[facility] [place], [place] [address]  Internal MEDICINE  Telephone Visit  Patient Name: Diana Bernard  [id number]  [id number]  Date of Service: 04/22/2019  I connected with the patient at 1100 by telephone and verified the patients identity using two identifiers.   I discussed the limitations, risks, security and privacy concerns of performing an evaluation and management service by telephone and the availability of in person appointments. I also discussed with the patient that there may be a patient responsible charge related to the service.  The patient expressed understanding and agrees to proceed.    Chief Complaint  Patient presents with  . Telephone Assessment  . Telephone Screen  . Follow-up    ct review    HPI  Pt is seen via telephone. She is following up on Ct scans. At a previous visit she was complaining of memory changes.  With this, and her history of cancer a head ct was ordered.  That Scan is negative for lesions, but does show some small vessel ischemic changes.  While waiting for that scan, a chest CT was ordered to follow up on pulmonary nodules from a scan in 2015.  Her new chest CT shows those areas have mostly resolved, there is some scarring present, and there is also some aortic atherosclerosis.  It would be worth having Carotid US ordered, given her memory changes, and the atherosclerosis that is present on CT.      Current Medication: Outpatient Encounter Medications as of 04/22/2019  Medication Sig Note  . amLODipine (NORVASC) 10 MG tablet Take 1 tab po daily   . aspirin EC 325 MG tablet Take 325 mg by mouth. 06/16/2015: Takes 1 tablet everyday.   . budesonide-formoterol (SYMBICORT) 160-4.5 MCG/ACT inhaler Inhale 2 puffs into the lungs 2 (two) times daily.   . cholecalciferol (VITAMIN D) 1000 units tablet Take 1,000 Units by mouth daily.   . cilostazol (PLETAL) 50 MG tablet Take 1 tablet (50 mg total) by mouth 2 (two) times  daily.   . CYANOCOBALAMIN IJ Inject as directed.   . dicyclomine (BENTYL) 10 MG capsule Take 1 capsule po TID prn intestinal cramping/diarrhea   . diphenoxylate-atropine (LOMOTIL) 2.5-0.025 MG tablet Take 1 tablet by mouth 4 (four) times daily as needed for diarrhea or loose stools.   . docusate sodium (STOOL SOFTENER) 100 MG capsule Take 200 mg by mouth daily.  06/07/2015: Received from: [facility]  . DULoxetine (CYMBALTA) 30 MG capsule Take 1 capsule po bid   . esomeprazole (NEXIUM) 20 MG capsule Take 20 mg by mouth daily at 12 noon.   [name] ESOMEPRAZOLE MAGNESIUM PO Take 40 mg by mouth.   . furosemide (LASIX) 20 MG tablet Take 1 tablet (20 mg total) by mouth daily.   [name] gabapentin (NEURONTIN) 300 MG capsule Take 2 cap twice a daily for neuropathy   . hydrochlorothiazide (HYDRODIURIL) 25 MG tablet Take 25 mg by mouth daily.   [name] HYDROcodone-acetaminophen (NORCO/VICODIN) 5-325 MG tablet Take 1 tablet by mouth every 6 (six) hours as needed for moderate pain.   [name] ibuprofen (ADVIL,MOTRIN) 800 MG tablet Take 800 mg by mouth. Reported on 06/22/2015 06/16/2015: Can take up to 3 tablets per day as needed.   [name] ipratropium-albuterol (DUONEB) 0.5-2.5 (3) MG/3ML SOLN Take 3 mLs by nebulization every 4 (four) hours as needed.   [name] levothyroxine (SYNTHROID) 50 MCG tablet Take 1 tablet (50 mcg total) by mouth daily.   . Lidocaine HCl 3 %  GEL Reported on 06/22/2015 06/07/2015: Received from: [facility]  . lisinopril (ZESTRIL) 20 MG tablet Take 1 tablet (20 mg total) by mouth daily.   . Magnesium 250 MG TABS Take by mouth. 06/22/2015: Reports taking once a day  . metoprolol succinate (TOPROL-XL) 25 MG 24 hr tablet Take 12.5 mg by mouth.   . ondansetron (ZOFRAN) 8 MG tablet Take 1 tablet (8 mg total) by mouth every 8 (eight) hours as needed for nausea or vomiting.   [name] oxybutynin (DITROPAN-XL) 5 MG 24 hr tablet Take 1 tablet (5 mg total) by mouth 2 (two) times daily.   . Potassium 99 MG TABS Take by mouth. 06/16/2015:  Takes 2 tablets everyday.  [name] spironolactone (ALDACTONE) 25 MG tablet    . SPS 15 GM/60ML suspension    . traMADol (ULTRAM) 50 MG tablet Take 1 tablet (50 mg total) by mouth 2 (two) times daily. One tab po bid prn for pain    No facility-administered encounter medications on file as of 04/22/2019.    Surgical History: Past Surgical History:  Procedure Laterality Date  . ABCESS DRAINAGE  2016   Abdominal abcess due to diverticulitis  . caridoverter defibrillator  11/14/2017   ICD  . CATARACT EXTRACTION W/PHACO Left 10/26/2015   Procedure: CATARACT EXTRACTION PHACO AND INTRAOCULAR LENS PLACEMENT (IOC) LEFT EYE;  Surgeon: Leandrew Koyanagi, MD;  Location: [facility];  Service: Ophthalmology;  Laterality: Left;  . CATARACT EXTRACTION W/PHACO Right 12/07/2015   Procedure: CATARACT EXTRACTION PHACO AND INTRAOCULAR LENS PLACEMENT (IOC);  Surgeon: Leandrew Koyanagi, MD;  Location: [place] [facility];  Service: Ophthalmology;  Laterality: Right;  . CHOLECYSTECTOMY    . FOOT SURGERY     Per patient, bilateral foot surgery.  [name] PELVIC FRACTURE SURGERY     Per patient for cancer.    Medical History: Past Medical History:  Diagnosis Date  . Arthritis   . Cancer ([place])   . Deep vein thrombosis (DVT) ([place]) 2015  . Depression   . GERD (gastroesophageal reflux disease)   . Hypertension   . Weakness of both legs     Family History: Family History  Problem Relation Age of Onset  . Depression Sister   . Heart disease Sister   . Early death Sister   . Depression Sister   . Depression Sister   . Depression Sister   . Depression Sister     Social History   Socioeconomic History  . Marital status: Married    Spouse name: Not on file  . Number of children: Not on file  . Years of education: Not on file  . Highest education level: Not on file  Occupational History  . Not on file  Tobacco Use  . Smoking status: Never Smoker  . Smokeless tobacco: Never Used  Substance and  Sexual Activity  . Alcohol use: No  . Drug use: No  . Sexual activity: Not Currently  Other Topics Concern  . Not on file  Social History Narrative  . Not on file   Social Determinants of Health   Financial Resource Strain:   . Difficulty of Paying Living Expenses: Not on file  Food Insecurity:   . Worried About Charity fundraiser in the Last Year: Not on file  . Ran Out of Food in the Last Year: Not on file  Transportation Needs:   . Lack of Transportation (Medical): Not on file  . Lack of Transportation (Non-Medical): Not on file  Physical Activity:   .  Days of Exercise per Week: Not on file  . Minutes of Exercise per Session: Not on file  Stress:   . Feeling of Stress : Not on file  Social Connections:   . Frequency of Communication with Friends and Family: Not on file  . Frequency of Social Gatherings with Friends and Family: Not on file  . Attends Religious Services: Not on file  . Active Member of Clubs or Organizations: Not on file  . Attends Archivist Meetings: Not on file  . Marital Status: Not on file  Intimate Partner Violence:   . Fear of Current or Ex-Partner: Not on file  . Emotionally Abused: Not on file  . Physically Abused: Not on file  . Sexually Abused: Not on file      Review of Systems  Constitutional: Negative for chills, fatigue and unexpected weight change.  HENT: Negative for congestion, rhinorrhea, sneezing and sore throat.   Eyes: Negative for photophobia, pain and redness.  Respiratory: Negative for cough, chest tightness and shortness of breath.   Cardiovascular: Negative for chest pain and palpitations.  Gastrointestinal: Negative for abdominal pain, constipation, diarrhea, nausea and vomiting.  Endocrine: Negative.   Genitourinary: Negative for dysuria and frequency.  Musculoskeletal: Negative for arthralgias, back pain, joint swelling and neck pain.  Skin: Negative for rash.  Allergic/Immunologic: Negative.    Neurological: Negative for tremors and numbness.  Hematological: Negative for adenopathy. Does not bruise/bleed easily.  Psychiatric/Behavioral: Negative for behavioral problems and sleep disturbance. The patient is not nervous/anxious.     Vital Signs: BP (!) 158/78   Pulse 72   Ht 5' (1.524 m)   Wt 250 lb (113.4 kg)   BMI 48.82 kg/m    Observation/Objective:  Well sounding, NAD noted.    Assessment/Plan: 1. Memory loss Stable, could be from medication, pt does not wish to change medication at this time.  She is   2. Essential hypertension Stable, slightly elevated today. Continue to monitor.   3. Atherosclerosis of aorta ([place]) Will get Carotid US to assess for stenosis.  - US Carotid Duplex Bilateral; Future  4. Pulmonary nodules Have resolved on current Ct.  No further surveillance is recommended.   General Counseling: Xan verbalizes understanding of the findings of today's phone visit and agrees with plan of treatment. I have discussed any further diagnostic evaluation that may be needed or ordered today. We also reviewed her medications today. she has been encouraged to call the office with any questions or concerns that should arise related to todays visit.    Orders Placed This Encounter  Procedures  . US Carotid Duplex Bilateral    No orders of the defined types were placed in this encounter.   Time spent: [place] AGNP-C Internal medicine

## 2019-05-04 ENCOUNTER — Telehealth: Payer: Self-pay

## 2019-05-04 ENCOUNTER — Other Ambulatory Visit: Payer: Self-pay | Admitting: Nurse Practitioner

## 2019-05-04 DIAGNOSIS — J0140 Acute pansinusitis, unspecified: Secondary | ICD-10-CM

## 2019-05-04 MED ORDER — AZITHROMYCIN 250 MG PO TABS: ORAL_TABLET | ORAL | 0 refills | Status: DC

## 2019-05-04 NOTE — Telephone Encounter (Signed)
Sent z-pack for sinusitis. Should continue using OTC medications to treat acute symptoms. Call back if no better by end of week.

## 2019-05-04 NOTE — Progress Notes (Signed)
Sent z-pack for sinusitis. Should continue using OTC medications to treat acute symptoms. Call back if no better by end of week.

## 2019-05-06 ENCOUNTER — Ambulatory Visit: Payer: Medicare HMO | Admitting: Internal Medicine

## 2019-05-08 DIAGNOSIS — I5022 Chronic systolic (congestive) heart failure: Secondary | ICD-10-CM | POA: Diagnosis not present

## 2019-05-08 DIAGNOSIS — Z4502 Encounter for adjustment and management of automatic implantable cardiac defibrillator: Secondary | ICD-10-CM | POA: Diagnosis not present

## 2019-05-13 ENCOUNTER — Telehealth: Payer: Self-pay

## 2019-05-13 NOTE — Telephone Encounter (Signed)
Confirmed appointment with patient. klh °

## 2019-05-15 ENCOUNTER — Other Ambulatory Visit: Payer: Self-pay

## 2019-05-15 ENCOUNTER — Ambulatory Visit: Payer: Medicare HMO

## 2019-05-15 DIAGNOSIS — I700 Atherosclerosis of aorta: Secondary | ICD-10-CM

## 2019-05-15 DIAGNOSIS — I6523 Occlusion and stenosis of bilateral carotid arteries: Secondary | ICD-10-CM

## 2019-05-25 ENCOUNTER — Telehealth: Payer: Self-pay

## 2019-05-25 NOTE — Telephone Encounter (Signed)
Confirmed patient PFT for 05/27/2019

## 2019-05-27 ENCOUNTER — Ambulatory Visit: Payer: Medicare HMO | Admitting: Internal Medicine

## 2019-06-02 ENCOUNTER — Telehealth: Payer: Self-pay

## 2019-06-02 NOTE — Telephone Encounter (Signed)
Confirmed appointment with patient and screened for covid. [facility] 

## 2019-06-03 ENCOUNTER — Other Ambulatory Visit: Payer: Self-pay | Admitting: Nurse Practitioner

## 2019-06-03 ENCOUNTER — Other Ambulatory Visit: Payer: Self-pay

## 2019-06-03 ENCOUNTER — Ambulatory Visit: Payer: Medicare HMO | Admitting: Internal Medicine

## 2019-06-03 DIAGNOSIS — R0602 Shortness of breath: Secondary | ICD-10-CM | POA: Diagnosis not present

## 2019-06-03 DIAGNOSIS — K529 Noninfective gastroenteritis and colitis, unspecified: Secondary | ICD-10-CM

## 2019-06-03 LAB — PULMONARY FUNCTION TEST

## 2019-06-03 MED ORDER — CIPROFLOXACIN HCL 500 MG PO TABS: 500.0000 mg | ORAL_TABLET | Freq: Two times a day (BID) | ORAL | 0 refills | Status: DC

## 2019-06-03 NOTE — Progress Notes (Signed)
Sent prescription for cipro 500mg  bid for 10 days to [facility] due to probable gastroenteritis.

## 2019-06-04 ENCOUNTER — Encounter: Payer: Self-pay | Admitting: Internal Medicine

## 2019-06-04 ENCOUNTER — Ambulatory Visit: Payer: Medicare HMO | Admitting: Internal Medicine

## 2019-06-04 VITALS — BP 147/68 | HR 72 | Ht 60.0 in | Wt 240.0 lb

## 2019-06-04 DIAGNOSIS — R0600 Dyspnea, unspecified: Secondary | ICD-10-CM | POA: Diagnosis not present

## 2019-06-04 DIAGNOSIS — I6522 Occlusion and stenosis of left carotid artery: Secondary | ICD-10-CM | POA: Diagnosis not present

## 2019-06-04 DIAGNOSIS — G4733 Obstructive sleep apnea (adult) (pediatric): Secondary | ICD-10-CM | POA: Diagnosis not present

## 2019-06-04 DIAGNOSIS — Z9989 Dependence on other enabling machines and devices: Secondary | ICD-10-CM | POA: Diagnosis not present

## 2019-06-04 DIAGNOSIS — R413 Other amnesia: Secondary | ICD-10-CM | POA: Diagnosis not present

## 2019-06-04 NOTE — Progress Notes (Signed)
[facility] [place], [place] [address]  Internal MEDICINE  Telephone Visit  Patient Name: Diana Bernard  [id number]  [id number]  Date of Service: 06/04/2019  I connected with the patient at 1047 by telephone and verified the patients identity using two identifiers.   I discussed the limitations, risks, security and privacy concerns of performing an evaluation and management service by telephone and the availability of in person appointments. I also discussed with the patient that there may be a patient responsible charge related to the service.  The patient expressed understanding and agrees to proceed.    Chief Complaint  Patient presents with  . Telephone Assessment  . Telephone Screen  . Follow-up    review pft and carotid US    HPI  Pt seen via video to follow up on PFT and carotid US. Her PFT is normal.  The carotid US shows 50-60% blockage on the left. She continues to report some memory difficulty, and some brain fogginess.  She also says she continues to have dyspnea on exertion at times, she reports this was made worse after having her defibrillator placed.      Current Medication: Outpatient Encounter Medications as of 06/04/2019  Medication Sig Note  . amLODipine (NORVASC) 10 MG tablet Take 1 tab po daily   . aspirin EC 325 MG tablet Take 325 mg by mouth. 06/16/2015: Takes 1 tablet everyday.   . budesonide-formoterol (SYMBICORT) 160-4.5 MCG/ACT inhaler Inhale 2 puffs into the lungs 2 (two) times daily.   . cholecalciferol (VITAMIN D) 1000 units tablet Take 1,000 Units by mouth daily.   . cilostazol (PLETAL) 50 MG tablet Take 1 tablet (50 mg total) by mouth 2 (two) times daily.   . ciprofloxacin (CIPRO) 500 MG tablet Take 1 tablet (500 mg total) by mouth 2 (two) times daily.   . CYANOCOBALAMIN IJ Inject as directed.   . dicyclomine (BENTYL) 10 MG capsule Take 1 capsule po TID prn intestinal cramping/diarrhea   . diphenoxylate-atropine  (LOMOTIL) 2.5-0.025 MG tablet Take 1 tablet by mouth 4 (four) times daily as needed for diarrhea or loose stools.   . docusate sodium (STOOL SOFTENER) 100 MG capsule Take 200 mg by mouth daily.  06/07/2015: Received from: [facility]  . DULoxetine (CYMBALTA) 30 MG capsule Take 1 capsule po bid   . esomeprazole (NEXIUM) 20 MG capsule Take 20 mg by mouth daily at 12 noon.   [name] ESOMEPRAZOLE MAGNESIUM PO Take 40 mg by mouth.   . furosemide (LASIX) 20 MG tablet Take 1 tablet (20 mg total) by mouth daily.   [name] gabapentin (NEURONTIN) 300 MG capsule Take 2 cap twice a daily for neuropathy   . hydrochlorothiazide (HYDRODIURIL) 25 MG tablet Take 25 mg by mouth daily.   [name] HYDROcodone-acetaminophen (NORCO/VICODIN) 5-325 MG tablet Take 1 tablet by mouth every 6 (six) hours as needed for moderate pain.   [name] ibuprofen (ADVIL,MOTRIN) 800 MG tablet Take 800 mg by mouth. Reported on 06/22/2015 06/16/2015: Can take up to 3 tablets per day as needed.   [name] ipratropium-albuterol (DUONEB) 0.5-2.5 (3) MG/3ML SOLN Take 3 mLs by nebulization every 4 (four) hours as needed.   [name] levothyroxine (SYNTHROID) 50 MCG tablet Take 1 tablet (50 mcg total) by mouth daily.   . Lidocaine HCl 3 % GEL Reported on 06/22/2015 06/07/2015: Received from: [facility]  . lisinopril (ZESTRIL) 20 MG tablet Take 1 tablet (20 mg total) by mouth daily.   . Magnesium 250  MG TABS Take by mouth. 06/22/2015: Reports taking once a day  . metoprolol succinate (TOPROL-XL) 25 MG 24 hr tablet Take 12.5 mg by mouth.   . ondansetron (ZOFRAN) 8 MG tablet Take 1 tablet (8 mg total) by mouth every 8 (eight) hours as needed for nausea or vomiting.   [name] oxybutynin (DITROPAN-XL) 5 MG 24 hr tablet Take 1 tablet (5 mg total) by mouth 2 (two) times daily.   . Potassium 99 MG TABS Take by mouth. 06/16/2015: Takes 2 tablets everyday.  [name] spironolactone (ALDACTONE) 25 MG tablet    . SPS 15 GM/60ML suspension    . traMADol (ULTRAM) 50 MG tablet Take 1 tablet (50 mg total) by  mouth 2 (two) times daily. One tab po bid prn for pain    No facility-administered encounter medications on file as of 06/04/2019.    Surgical History: Past Surgical History:  Procedure Laterality Date  . ABCESS DRAINAGE  2016   Abdominal abcess due to diverticulitis  . caridoverter defibrillator  11/14/2017   ICD  . CATARACT EXTRACTION W/PHACO Left 10/26/2015   Procedure: CATARACT EXTRACTION PHACO AND INTRAOCULAR LENS PLACEMENT (IOC) LEFT EYE;  Surgeon: Leandrew Koyanagi, MD;  Location: [place];  Service: Ophthalmology;  Laterality: Left;  . CATARACT EXTRACTION W/PHACO Right 12/07/2015   Procedure: CATARACT EXTRACTION PHACO AND INTRAOCULAR LENS PLACEMENT (IOC);  Surgeon: Leandrew Koyanagi, MD;  Location: [facility];  Service: Ophthalmology;  Laterality: Right;  . CHOLECYSTECTOMY    . FOOT SURGERY     Per patient, bilateral foot surgery.  [name] PELVIC FRACTURE SURGERY     Per patient for cancer.    Medical History: Past Medical History:  Diagnosis Date  . Arthritis   . Cancer ([place])   . Deep vein thrombosis (DVT) ([place]) 2015  . Depression   . GERD (gastroesophageal reflux disease)   . Hypertension   . Weakness of both legs     Family History: Family History  Problem Relation Age of Onset  . Depression Sister   . Heart disease Sister   . Early death Sister   . Depression Sister   . Depression Sister   . Depression Sister   . Depression Sister     Social History   Socioeconomic History  . Marital status: Married    Spouse name: Not on file  . Number of children: Not on file  . Years of education: Not on file  . Highest education level: Not on file  Occupational History  . Not on file  Tobacco Use  . Smoking status: Never Smoker  . Smokeless tobacco: Never Used  Substance and Sexual Activity  . Alcohol use: No  . Drug use: No  . Sexual activity: Not Currently  Other Topics Concern  . Not on file  Social History Narrative  . Not on file    Social Determinants of Health   Financial Resource Strain:   . Difficulty of Paying Living Expenses: Not on file  Food Insecurity:   . Worried About Charity fundraiser in the Last Year: Not on file  . Ran Out of Food in the Last Year: Not on file  Transportation Needs:   . Lack of Transportation (Medical): Not on file  . Lack of Transportation (Non-Medical): Not on file  Physical Activity:   . Days of Exercise per Week: Not on file  . Minutes of Exercise per Session: Not on file  Stress:   . Feeling of Stress : Not on  file  Social Connections:   . Frequency of Communication with Friends and Family: Not on file  . Frequency of Social Gatherings with Friends and Family: Not on file  . Attends Religious Services: Not on file  . Active Member of Clubs or Organizations: Not on file  . Attends Archivist Meetings: Not on file  . Marital Status: Not on file  Intimate Partner Violence:   . Fear of Current or Ex-Partner: Not on file  . Emotionally Abused: Not on file  . Physically Abused: Not on file  . Sexually Abused: Not on file      Review of Systems  Constitutional: Negative for chills, fatigue and unexpected weight change.  HENT: Negative for congestion, rhinorrhea, sneezing and sore throat.   Eyes: Negative for photophobia, pain and redness.  Respiratory: Negative for cough, chest tightness and shortness of breath.   Cardiovascular: Negative for chest pain and palpitations.  Gastrointestinal: Negative for abdominal pain, constipation, diarrhea, nausea and vomiting.  Endocrine: Negative.   Genitourinary: Negative for dysuria and frequency.  Musculoskeletal: Negative for arthralgias, back pain, joint swelling and neck pain.  Skin: Negative for rash.  Allergic/Immunologic: Negative.   Neurological: Negative for tremors and numbness.  Hematological: Negative for adenopathy. Does not bruise/bleed easily.  Psychiatric/Behavioral: Negative for behavioral problems  and sleep disturbance. The patient is not nervous/anxious.     Vital Signs: BP (!) 147/68   Pulse 72   Ht 5' (1.524 m)   Wt 240 lb (108.9 kg)   BMI 46.87 kg/m    Observation/Objective:  Well sounding, NAD noted.   Assessment/Plan: 1. OSA on CPAP Continue to use cpap as directed.  Good compliance, good relief of symptoms  2. Dyspnea, unspecified type Intermittent.   3. Stenosis of left carotid artery 50-60% stenosis of left carotid.  Discussed lifestyle modifications, pt declined adding statin to regimen.  Will repeat US in one year.  4. Memory loss Mild ischemic changes on CT, possibly early dementia. She declined referral to neurology, and is not interested in have CTA of brain at this time.  General Counseling: Shavonn verbalizes understanding of the findings of today's phone visit and agrees with plan of treatment. I have discussed any further diagnostic evaluation that may be needed or ordered today. We also reviewed her medications today. she has been encouraged to call the office with any questions or concerns that should arise related to todays visit.    No orders of the defined types were placed in this encounter.   No orders of the defined types were placed in this encounter.   Time spent:20 Minutes    Orson Gear AGNP-C Pulmonary medicine

## 2019-06-10 NOTE — Procedures (Signed)
[place], [place] [address]  DATE OF SERVICE: May 15, 2019  CAROTID DOPPLER INTERPRETATION:  Bilateral Carotid Ultrsasound and Color Doppler Examination was performed. The RIGHT CCA shows mild plaque in the vessel. The LEFT CCA shows mild plaque in the vessel. There was no intimal thickening noted in the RIGHT carotid artery. There was no intimal thickening in the LEFT carotid artery.  The RIGHT CCA shows peak systolic velocity of 123456 cm per second. The end diastolic velocity is 123XX123 cm per second on the RIGHT side. The RIGHT ICA shows peak systolic velocity of XX123456 per second. RIGHT sided ICA end diastolic velocity is 123XX123 cm per second. The RIGHT ECA shows a peak systolic velocity of 0000000 cm per second. The ICA/CCA ratio is calculated to be 0.89. This suggests less than 50% stenosis. The Vertebral Artery shows antegrade flow.  The LEFT CCA shows peak systolic velocity of XX123456 cm per second. The end diastolic velocity is XX123456 cm per second on the LEFT side. The LEFT ICA shows peak systolic velocity of 123456 per second. LEFT sided ICA end diastolic velocity is 9.0 cm per second. The LEFT ECA shows a peak systolic velocity of XX123456 cm per second. The ICA/CCA ratio is calculated to be 1.6. This suggests a 50 to 69% stenosis. The Vertebral Artery shows antegrade flow.   Impression:    The RIGHT CAROTID shows less than 50% stenosis. The LEFT CAROTID shows 50 to 69% stenosis.  There is mild plaque formation noted on the LEFT and mild on the RIGHT  side. Consider a repeat Carotid doppler if clinical situation and symptoms warrant in 6-12 months. Patient should be encouraged to change lifestyles such as smoking cessation, regular exercise and dietary modification. Use of statins in the right clinical setting and ASA is encouraged.  Allyne Gee, MD [facility] Pulmonary Critical Care Medicine

## 2019-06-10 NOTE — Procedures (Signed)
[facility] [place], [address]  DATE OF SERVICE: June 03, 2019  Complete Pulmonary Function Testing Interpretation:  FINDINGS:  Forced vital capacity is normal FEV1 is normal.  FEV1 FVC ratio is mildly decreased.  Postbronchodilator no significant change in the FEV1 clinical improvement may occur in the absence spirometric improvement.  Total lung capacity is normal residual volume is increased.  Residual length and 1 capacity ratio is increased.  FRC is increased.  DLCO is normal.  IMPRESSION:  This pulmonary function study is within normal limits.  Clinical correlation is recommended  Allyne Gee, MD [facility] Pulmonary Critical Care Medicine Sleep Medicine

## 2019-07-02 DIAGNOSIS — H04123 Dry eye syndrome of bilateral lacrimal glands: Secondary | ICD-10-CM | POA: Diagnosis not present

## 2019-07-06 ENCOUNTER — Telehealth: Payer: Self-pay

## 2019-07-06 ENCOUNTER — Other Ambulatory Visit: Payer: Self-pay

## 2019-07-06 MED ORDER — FLUTICASONE-SALMETEROL 250-50 MCG/DOSE IN AEPB: 1.0000 | INHALATION_SPRAY | Freq: Two times a day (BID) | RESPIRATORY_TRACT | 0 refills | Status: DC

## 2019-07-06 NOTE — Telephone Encounter (Signed)
Pt advised as per adam d/c symbicort and send advair

## 2019-07-17 ENCOUNTER — Telehealth: Payer: Self-pay

## 2019-07-17 NOTE — Telephone Encounter (Signed)
CONFIRMED AND SCREENED FOR 07-21-19 OV.

## 2019-07-21 ENCOUNTER — Encounter: Payer: Self-pay | Admitting: Nurse Practitioner

## 2019-07-21 ENCOUNTER — Ambulatory Visit (INDEPENDENT_AMBULATORY_CARE_PROVIDER_SITE_OTHER): Payer: Medicare HMO | Admitting: Nurse Practitioner

## 2019-07-21 ENCOUNTER — Other Ambulatory Visit: Payer: Self-pay

## 2019-07-21 VITALS — BP 191/81 | HR 62 | Temp 97.5°F | Resp 16 | Ht 60.0 in | Wt 263.2 lb

## 2019-07-21 DIAGNOSIS — E039 Hypothyroidism, unspecified: Secondary | ICD-10-CM

## 2019-07-21 DIAGNOSIS — M5442 Lumbago with sciatica, left side: Secondary | ICD-10-CM

## 2019-07-21 DIAGNOSIS — M5127 Other intervertebral disc displacement, lumbosacral region: Secondary | ICD-10-CM | POA: Diagnosis not present

## 2019-07-21 DIAGNOSIS — I10 Essential (primary) hypertension: Secondary | ICD-10-CM | POA: Diagnosis not present

## 2019-07-21 DIAGNOSIS — M5441 Lumbago with sciatica, right side: Secondary | ICD-10-CM | POA: Diagnosis not present

## 2019-07-21 DIAGNOSIS — G8929 Other chronic pain: Secondary | ICD-10-CM

## 2019-07-21 DIAGNOSIS — F321 Major depressive disorder, single episode, moderate: Secondary | ICD-10-CM | POA: Diagnosis not present

## 2019-07-21 MED ORDER — HYDROCODONE-ACETAMINOPHEN 5-325 MG PO TABS: 1.0000 | ORAL_TABLET | Freq: Four times a day (QID) | ORAL | 0 refills | Status: DC | PRN

## 2019-07-21 MED ORDER — LEVOTHYROXINE SODIUM 50 MCG PO TABS: 50.0000 ug | ORAL_TABLET | Freq: Every day | ORAL | 1 refills | Status: DC

## 2019-07-21 NOTE — Progress Notes (Signed)
[facility] [place], [place] [address]  Internal MEDICINE  Office Visit Note  Patient Name: Diana Bernard  [id number]  [id number]  Date of Service: 08/02/2019  Chief Complaint  Patient presents with  . Hypertension  . Gastroesophageal Reflux  . Arthritis  . Medication Management    antidepressant does not seem to be agreeing with her   . Back Pain    The patient is here for follow up visit. She is having severe back pain. Pain is contributing to blood pressure being very elevated today.  States that back gets so painful, it is hard to work or doing anything requiring exertion. Has had injections into her spine which have not worked at all. Pain just continues to get worse. She has not gone back to pain management provider as prior treatments have not been helpful.  She states that she is getting very fatigued ery quickly. States she just doesn't have energy to do much of anything, including things she loves to do. Reviewing her labs done 03/2019, her TSH was normal, byt Free T4 was a bit low.       Current Medication: Outpatient Encounter Medications as of 07/21/2019  Medication Sig Note  . amLODipine (NORVASC) 10 MG tablet Take 1 tab po daily   . cholecalciferol (VITAMIN D) 1000 units tablet Take 1,000 Units by mouth daily.   . cilostazol (PLETAL) 50 MG tablet Take 1 tablet (50 mg total) by mouth 2 (two) times daily.   [name] dicyclomine (BENTYL) 10 MG capsule Take 1 capsule po TID prn intestinal cramping/diarrhea   . diphenoxylate-atropine (LOMOTIL) 2.5-0.025 MG tablet Take 1 tablet by mouth 4 (four) times daily as needed for diarrhea or loose stools.   . docusate sodium (STOOL SOFTENER) 100 MG capsule Take 200 mg by mouth daily.  06/07/2015: Received from: [facility]  . DULoxetine (CYMBALTA) 30 MG capsule Take 1 capsule po bid   . esomeprazole (NEXIUM) 20 MG capsule Take 20 mg by mouth daily at 12 noon.   [name] ESOMEPRAZOLE MAGNESIUM PO Take 40 mg by  mouth.   . Fluticasone-Salmeterol (ADVAIR) 250-50 MCG/DOSE AEPB Inhale 1 puff into the lungs 2 (two) times daily.   . furosemide (LASIX) 20 MG tablet Take 1 tablet (20 mg total) by mouth daily.   [name] gabapentin (NEURONTIN) 300 MG capsule Take 2 cap twice a daily for neuropathy   . hydrochlorothiazide (HYDRODIURIL) 25 MG tablet Take 25 mg by mouth daily.   [name] HYDROcodone-acetaminophen (NORCO/VICODIN) 5-325 MG tablet Take 1 tablet by mouth every 6 (six) hours as needed for moderate pain.   [name] ibuprofen (ADVIL,MOTRIN) 800 MG tablet Take 800 mg by mouth. Reported on 06/22/2015 06/16/2015: Can take up to 3 tablets per day as needed.   [name] ipratropium-albuterol (DUONEB) 0.5-2.5 (3) MG/3ML SOLN Take 3 mLs by nebulization every 4 (four) hours as needed.   . Lidocaine HCl 3 % GEL Reported on 06/22/2015 06/07/2015: Received from: [facility]  . lisinopril (ZESTRIL) 20 MG tablet Take 1 tablet (20 mg total) by mouth daily.   . Magnesium 250 MG TABS Take by mouth. 06/22/2015: Reports taking once a day  . metoprolol succinate (TOPROL-XL) 25 MG 24 hr tablet Take 12.5 mg by mouth.   . ondansetron (ZOFRAN) 8 MG tablet Take 1 tablet (8 mg total) by mouth every 8 (eight) hours as needed for nausea or vomiting.   [name] oxybutynin (DITROPAN-XL) 5 MG 24 hr tablet Take 1 tablet (5 mg  total) by mouth 2 (two) times daily.   . Potassium 99 MG TABS Take by mouth. 06/16/2015: Takes 2 tablets everyday.  [name] spironolactone (ALDACTONE) 25 MG tablet    . SPS 15 GM/60ML suspension    . traMADol (ULTRAM) 50 MG tablet Take 1 tablet (50 mg total) by mouth 2 (two) times daily. One tab po bid prn for pain   . [DISCONTINUED] HYDROcodone-acetaminophen (NORCO/VICODIN) 5-325 MG tablet Take 1 tablet by mouth every 6 (six) hours as needed for moderate pain.   [name] levothyroxine (SYNTHROID) 50 MCG tablet Take 1 tablet (50 mcg total) by mouth daily.   . [DISCONTINUED] ciprofloxacin (CIPRO) 500 MG tablet Take 1 tablet (500 mg total) by mouth 2 (two) times  daily. (Patient not taking: Reported on 07/21/2019)   . [DISCONTINUED] CYANOCOBALAMIN IJ Inject as directed.   . [DISCONTINUED] levothyroxine (SYNTHROID) 50 MCG tablet Take 1 tablet (50 mcg total) by mouth daily.    No facility-administered encounter medications on file as of 07/21/2019.    Surgical History: Past Surgical History:  Procedure Laterality Date  . ABCESS DRAINAGE  2016   Abdominal abcess due to diverticulitis  . caridoverter defibrillator  11/14/2017   ICD  . CATARACT EXTRACTION W/PHACO Left 10/26/2015   Procedure: CATARACT EXTRACTION PHACO AND INTRAOCULAR LENS PLACEMENT (IOC) LEFT EYE;  Surgeon: Leandrew Koyanagi, MD;  Location: [facility];  Service: Ophthalmology;  Laterality: Left;  . CATARACT EXTRACTION W/PHACO Right 12/07/2015   Procedure: CATARACT EXTRACTION PHACO AND INTRAOCULAR LENS PLACEMENT (IOC);  Surgeon: Leandrew Koyanagi, MD;  Location: [facility];  Service: Ophthalmology;  Laterality: Right;  . CHOLECYSTECTOMY    . FOOT SURGERY     Per patient, bilateral foot surgery.  [name] PELVIC FRACTURE SURGERY     Per patient for cancer.    Medical History: Past Medical History:  Diagnosis Date  . Arthritis   . Cancer ([place])   . Deep vein thrombosis (DVT) ([place]) 2015  . Depression   . GERD (gastroesophageal reflux disease)   . Hypertension   . Weakness of both legs     Family History: Family History  Problem Relation Age of Onset  . Depression Sister   . Heart disease Sister   . Early death Sister   . Depression Sister   . Depression Sister   . Depression Sister   . Depression Sister     Social History   Socioeconomic History  . Marital status: Married    Spouse name: Not on file  . Number of children: Not on file  . Years of education: Not on file  . Highest education level: Not on file  Occupational History  . Not on file  Tobacco Use  . Smoking status: Never Smoker  . Smokeless tobacco: Never Used  Substance and Sexual  Activity  . Alcohol use: No  . Drug use: No  . Sexual activity: Not Currently  Other Topics Concern  . Not on file  Social History Narrative  . Not on file   Social Determinants of Health   Financial Resource Strain:   . Difficulty of Paying Living Expenses:   Food Insecurity:   . Worried About Charity fundraiser in the Last Year:   . Arboriculturist in the Last Year:   Transportation Needs:   . Film/video editor (Medical):   [name] Lack of Transportation (Non-Medical):   Physical Activity:   . Days of Exercise per Week:   . Minutes of Exercise per  Session:   Stress:   . Feeling of Stress :   Social Connections:   . Frequency of Communication with Friends and Family:   . Frequency of Social Gatherings with Friends and Family:   . Attends Religious Services:   . Active Member of Clubs or Organizations:   . Attends Archivist Meetings:   [name] Marital Status:   Intimate Partner Violence:   . Fear of Current or Ex-Partner:   . Emotionally Abused:   [name] Physically Abused:   . Sexually Abused:       Review of Systems  Constitutional: Positive for activity change and fatigue. Negative for chills and unexpected weight change.  HENT: Negative for congestion, postnasal drip, rhinorrhea, sneezing and sore throat.   Respiratory: Negative for cough, chest tightness, shortness of breath and wheezing.   Cardiovascular: Negative for chest pain and palpitations.       Very elevated blood pressure.   Gastrointestinal: Negative for abdominal pain, constipation, diarrhea, nausea and vomiting.  Endocrine: Negative for cold intolerance, heat intolerance, polydipsia and polyuria.  Musculoskeletal: Positive for arthralgias, back pain and myalgias. Negative for joint swelling and neck pain.       Severe low back pain interfering with her ROM. She is having trouble participating in her activities of daily living.   Skin: Negative for rash.  Allergic/Immunologic: Negative for  environmental allergies.  Neurological: Negative for dizziness, tremors, numbness and headaches.  Hematological: Negative for adenopathy. Does not bruise/bleed easily.  Psychiatric/Behavioral: Positive for dysphoric mood. Negative for behavioral problems (Depression), sleep disturbance and suicidal ideas. The patient is not nervous/anxious.     Today's Vitals   07/21/19 1026  BP: (!) 191/81  Pulse: 62  Resp: 16  Temp: (!) 97.5 F (36.4 C)  SpO2: 96%  Weight: 263 lb 3.2 oz (119.4 kg)  Height: 5' (1.524 m)   Body mass index is 51.4 kg/m.   Physical Exam Vitals and nursing note reviewed.  Constitutional:      General: She is not in acute distress.    Appearance: Normal appearance. She is well-developed. She is obese. She is not diaphoretic.  HENT:     Head: Normocephalic and atraumatic.     Mouth/Throat:     Pharynx: No oropharyngeal exudate.  Eyes:     Pupils: Pupils are equal, round, and reactive to light.  Neck:     Thyroid: No thyromegaly.     Vascular: No carotid bruit or JVD.     Trachea: No tracheal deviation.  Cardiovascular:     Rate and Rhythm: Normal rate and regular rhythm.     Heart sounds: Normal heart sounds. No murmur. No friction rub. No gallop.      Comments: Bilateral lower extremities have mild, non-pitting edema. They are cool to palpate. Pulses are 1+bilaterally.  Pulmonary:     Effort: Pulmonary effort is normal. No respiratory distress.     Breath sounds: Normal breath sounds. No wheezing or rales.  Chest:     Chest wall: No tenderness.  Abdominal:     Palpations: Abdomen is soft.  Musculoskeletal:        General: Normal range of motion.     Cervical back: Normal range of motion and neck supple.     Comments: Moderate to severe lower back pain. This is especially severe with bending and twisting at the waist. improved some since her most recent visit. No visible or palpable abnormalities at this time.   Lymphadenopathy:  Cervical: No  cervical adenopathy.  Skin:    General: Skin is warm and dry.  Neurological:     Mental Status: She is alert and oriented to person, place, and time.     Cranial Nerves: No cranial nerve deficit.  Psychiatric:        Behavior: Behavior normal.        Thought Content: Thought content normal.        Judgment: Judgment normal.     Assessment/Plan: 1. Acquired hypothyroidism Add levothyroxine 71mcg daily. Recheck thyroid panel prior to her next visit. Adjust dosing as indicated.  - levothyroxine (SYNTHROID) 50 MCG tablet; Take 1 tablet (50 mcg total) by mouth daily.  Dispense: 90 tablet; Refill: 1  2. Chronic bilateral low back pain with bilateral sciatica Short term prescription for hydrocodone/APAP 5/325mg  tablets. May be taken up to four times daily as needed for severe pain. Single prescription for #30 tablets sent to her pharmacy.  - HYDROcodone-acetaminophen (NORCO/VICODIN) 5-325 MG tablet; Take 1 tablet by mouth every 6 (six) hours as needed for moderate pain.  Dispense: 30 tablet; Refill: 0  3. Lumbago-sciatica due to displacement of lumbar intervertebral disc Short term prescription for hydrocodone/APAP 5/325mg  tablets. May be taken up to four times daily as needed for severe pain. Single prescription for #30 tablets sent to her pharmacy. Strongly encouraged her to contact her pain management provider for further evaluation and treatment.   4. Moderate major depression (HCC) No changes made in cymbalta today. Discussed pain contributing to worsening depression. Strongly recommended visit to pain management provider for further evaluation and treatment.   5. Essential hypertension Pain contributing to high blood pressure. Short-term prescription for pain medication given. Patient set to see her cardiologist soon.   General Counseling: Diana Bernard verbalizes understanding of the findings of todays visit and agrees with plan of treatment. I have discussed any further diagnostic evaluation  that may be needed or ordered today. We also reviewed her medications today. she has been encouraged to call the office with any questions or concerns that should arise related to todays visit.   Reviewed risks and possible side effects associated with taking opiates, benzodiazepines and other CNS depressants. Combination of these could cause dizziness and drowsiness. Advised patient not to drive or operate machinery when taking these medications, as patient's and other's life can be at risk and will have consequences. Patient verbalized understanding in this matter. Dependence and abuse for these drugs will be monitored closely. A Controlled substance policy and procedure is on file which allows [facility] to order a urine drug screen test at any visit. Patient understands and agrees with the plan  This patient was seen by Leretha Pol FNP Collaboration with Dr Lavera Guise as a part of collaborative care agreement  Meds ordered this encounter  Medications  . levothyroxine (SYNTHROID) 50 MCG tablet    Sig: Take 1 tablet (50 mcg total) by mouth daily.    Dispense:  90 tablet    Refill:  1    Order Specific Question:   Supervising Provider    Answer:   Lavera Guise [id number]  . HYDROcodone-acetaminophen (NORCO/VICODIN) 5-325 MG tablet    Sig: Take 1 tablet by mouth every 6 (six) hours as needed for moderate pain.    Dispense:  30 tablet    Refill:  0    Order Specific Question:   Supervising Provider    Answer:   Lavera Guise [id number]    Total time  spent: 30 Minutes  Time spent includes review of chart, medications, test results, and follow up plan with the patient.      Dr Lavera Guise Internal medicine

## 2019-07-28 DIAGNOSIS — Z0100 Encounter for examination of eyes and vision without abnormal findings: Secondary | ICD-10-CM | POA: Diagnosis not present

## 2019-08-02 DIAGNOSIS — E039 Hypothyroidism, unspecified: Secondary | ICD-10-CM | POA: Insufficient documentation

## 2019-08-07 DIAGNOSIS — I5022 Chronic systolic (congestive) heart failure: Secondary | ICD-10-CM | POA: Diagnosis not present

## 2019-08-07 DIAGNOSIS — Z4502 Encounter for adjustment and management of automatic implantable cardiac defibrillator: Secondary | ICD-10-CM | POA: Diagnosis not present

## 2019-08-10 DIAGNOSIS — M461 Sacroiliitis, not elsewhere classified: Secondary | ICD-10-CM | POA: Diagnosis not present

## 2019-08-10 DIAGNOSIS — R0600 Dyspnea, unspecified: Secondary | ICD-10-CM | POA: Diagnosis not present

## 2019-08-10 DIAGNOSIS — R2689 Other abnormalities of gait and mobility: Secondary | ICD-10-CM | POA: Diagnosis not present

## 2019-08-10 DIAGNOSIS — R790 Abnormal level of blood mineral: Secondary | ICD-10-CM | POA: Diagnosis not present

## 2019-08-10 DIAGNOSIS — G629 Polyneuropathy, unspecified: Secondary | ICD-10-CM | POA: Diagnosis not present

## 2019-08-10 DIAGNOSIS — Z5111 Encounter for antineoplastic chemotherapy: Secondary | ICD-10-CM | POA: Diagnosis not present

## 2019-08-10 DIAGNOSIS — C541 Malignant neoplasm of endometrium: Secondary | ICD-10-CM | POA: Diagnosis not present

## 2019-08-18 DIAGNOSIS — M533 Sacrococcygeal disorders, not elsewhere classified: Secondary | ICD-10-CM | POA: Diagnosis not present

## 2019-08-18 DIAGNOSIS — M461 Sacroiliitis, not elsewhere classified: Secondary | ICD-10-CM | POA: Diagnosis not present

## 2019-08-31 DIAGNOSIS — G4733 Obstructive sleep apnea (adult) (pediatric): Secondary | ICD-10-CM | POA: Diagnosis not present

## 2019-09-16 ENCOUNTER — Telehealth: Payer: Self-pay

## 2019-09-16 ENCOUNTER — Other Ambulatory Visit: Payer: Self-pay | Admitting: Nurse Practitioner

## 2019-09-16 DIAGNOSIS — J069 Acute upper respiratory infection, unspecified: Secondary | ICD-10-CM

## 2019-09-16 DIAGNOSIS — R062 Wheezing: Secondary | ICD-10-CM

## 2019-09-16 MED ORDER — AMOXICILLIN-POT CLAVULANATE 875-125 MG PO TABS: 1.0000 | ORAL_TABLET | Freq: Two times a day (BID) | ORAL | 0 refills | Status: DC

## 2019-09-16 MED ORDER — ALBUTEROL SULFATE HFA 108 (90 BASE) MCG/ACT IN AERS: 2.0000 | INHALATION_SPRAY | Freq: Four times a day (QID) | RESPIRATORY_TRACT | 1 refills | Status: AC | PRN

## 2019-09-16 NOTE — Telephone Encounter (Signed)
Start augmentin 875mg  bid for 10 days. Rest and increase fluids. Added rescue inhaler. Use 2 inhalations up to four times daily as needed for weezing/shortness of breath. Sent to [facility] [address].

## 2019-09-16 NOTE — Progress Notes (Signed)
Start augmentin 875mg  bid for 10 days. Rest and increase fluids. Added rescue inhaler. Use 2 inhalations up to four times daily as needed for weezing/shortness of breath. Sent to [facility] [address].

## 2019-09-16 NOTE — Telephone Encounter (Signed)
Pt.notified

## 2019-09-23 ENCOUNTER — Other Ambulatory Visit: Payer: Self-pay

## 2019-09-23 ENCOUNTER — Ambulatory Visit (INDEPENDENT_AMBULATORY_CARE_PROVIDER_SITE_OTHER): Payer: Medicare HMO

## 2019-09-23 DIAGNOSIS — G4733 Obstructive sleep apnea (adult) (pediatric): Secondary | ICD-10-CM

## 2019-09-23 NOTE — Progress Notes (Signed)
95 percentile pressure 10   95th percentile leak 33.7   apnea index 0.7 /hr  apnea-hypopnea index  0.9 /hr   total days used  >4 hr 25 days  total days used <4 hr 2 days  Total compliance 83 percent  She is doing good, has had some trouble sleep and having nightmares. Cpap is doing good

## 2019-09-29 ENCOUNTER — Telehealth: Payer: Self-pay

## 2019-09-29 NOTE — Telephone Encounter (Signed)
Called lmom informing patient of appointment on 10/01/2019. klh °

## 2019-10-01 ENCOUNTER — Other Ambulatory Visit: Payer: Self-pay

## 2019-10-01 ENCOUNTER — Encounter: Payer: Self-pay | Admitting: Internal Medicine

## 2019-10-01 ENCOUNTER — Ambulatory Visit: Payer: Medicare HMO | Admitting: Internal Medicine

## 2019-10-01 VITALS — BP 140/70 | HR 65 | Temp 97.3°F | Resp 16 | Ht 60.0 in | Wt 265.0 lb

## 2019-10-01 DIAGNOSIS — R0602 Shortness of breath: Secondary | ICD-10-CM | POA: Diagnosis not present

## 2019-10-01 DIAGNOSIS — G4733 Obstructive sleep apnea (adult) (pediatric): Secondary | ICD-10-CM | POA: Diagnosis not present

## 2019-10-01 DIAGNOSIS — G8929 Other chronic pain: Secondary | ICD-10-CM

## 2019-10-01 DIAGNOSIS — M5441 Lumbago with sciatica, right side: Secondary | ICD-10-CM

## 2019-10-01 DIAGNOSIS — M5442 Lumbago with sciatica, left side: Secondary | ICD-10-CM

## 2019-10-01 DIAGNOSIS — M8580 Other specified disorders of bone density and structure, unspecified site: Secondary | ICD-10-CM

## 2019-10-01 DIAGNOSIS — I5022 Chronic systolic (congestive) heart failure: Secondary | ICD-10-CM

## 2019-10-01 DIAGNOSIS — Z9989 Dependence on other enabling machines and devices: Secondary | ICD-10-CM

## 2019-10-01 NOTE — Patient Instructions (Signed)
Echocardiogram An echocardiogram is a procedure that uses painless sound waves (ultrasound) to produce an image of the heart. Images from an echocardiogram can provide important information about:  Signs of coronary artery disease (CAD).  Aneurysm detection. An aneurysm is a weak or damaged part of an artery wall that bulges out from the normal force of blood pumping through the body.  Heart size and shape. Changes in the size or shape of the heart can be associated with certain conditions, including heart failure, aneurysm, and CAD.  Heart muscle function.  Heart valve function.  Signs of a past heart attack.  Fluid buildup around the heart.  Thickening of the heart muscle.  A tumor or infectious growth around the heart valves. Tell a health care provider about:  Any allergies you have.  All medicines you are taking, including vitamins, herbs, eye drops, creams, and over-the-counter medicines.  Any blood disorders you have.  Any surgeries you have had.  Any medical conditions you have.  Whether you are pregnant or may be pregnant. What are the risks? Generally, this is a safe procedure. However, problems may occur, including:  Allergic reaction to dye (contrast) that may be used during the procedure. What happens before the procedure? No specific preparation is needed. You may eat and drink normally. What happens during the procedure?   An IV tube may be inserted into one of your veins.  You may receive contrast through this tube. A contrast is an injection that improves the quality of the pictures from your heart.  A gel will be applied to your chest.  A wand-like tool (transducer) will be moved over your chest. The gel will help to transmit the sound waves from the transducer.  The sound waves will harmlessly bounce off of your heart to allow the heart images to be captured in real-time motion. The images will be recorded on a computer. The procedure may vary  among health care providers and hospitals. What happens after the procedure?  You may return to your normal, everyday life, including diet, activities, and medicines, unless your health care provider tells you not to do that. Summary  An echocardiogram is a procedure that uses painless sound waves (ultrasound) to produce an image of the heart.  Images from an echocardiogram can provide important information about the size and shape of your heart, heart muscle function, heart valve function, and fluid buildup around your heart.  You do not need to do anything to prepare before this procedure. You may eat and drink normally.  After the echocardiogram is completed, you may return to your normal, everyday life, unless your health care provider tells you not to do that. This information is not intended to replace advice given to you by your health care provider. Make sure you discuss any questions you have with your health care provider. Document Revised: 08/07/2018 Document Reviewed: 05/19/2016 Elsevier Patient Education  2020 [employer]  

## 2019-10-01 NOTE — Progress Notes (Signed)
[facility] [place], [place] [address]  Pulmonary Sleep Medicine   Office Visit Note  Patient Name: Diana Bernard DOB: December 23, 1945 MRN [id number]  Date of Service: 10/01/2019  Complaints/HPI: OSA SOB Chronic Pain CHF systolic.  She has been doing a little bit better from the sleep perspective.  Still having significant shortness of breath when she mainly exerts herself.  She realizes that symptoms are not going to entirely resolved but she has been learning to deal with the symptoms in a better manner.  As far as her chronic pain is controlled and will continue to follow-up.  Next is a CHF she does have systolic heart failure needs to monitor the patient's echocardiograms closely we will continue to diuresis as tolerated.  ROS  General: (-) fever, (-) chills, (-) night sweats, (-) weakness Skin: (-) rashes, (-) itching,. Eyes: (-) visual changes, (-) redness, (-) itching. Nose and Sinuses: (-) nasal stuffiness or itchiness, (-) postnasal drip, (-) nosebleeds, (-) sinus trouble. Mouth and Throat: (-) sore throat, (-) hoarseness. Neck: (-) swollen glands, (-) enlarged thyroid, (-) neck pain. Respiratory: - cough, (-) bloody sputum, + shortness of breath, - wheezing. Cardiovascular: - ankle swelling, (-) chest pain. Lymphatic: (-) lymph node enlargement. Neurologic: (-) numbness, (-) tingling. Psychiatric: (-) anxiety, (-) depression   Current Medication: Outpatient Encounter Medications as of 10/01/2019  Medication Sig Note  . albuterol (VENTOLIN HFA) 108 (90 Base) MCG/ACT inhaler Inhale 2 puffs into the lungs every 6 (six) hours as needed for wheezing or shortness of breath.   [name] amLODipine (NORVASC) 10 MG tablet Take 1 tab po daily   . cholecalciferol (VITAMIN D) 1000 units tablet Take 1,000 Units by mouth daily.   . cilostazol (PLETAL) 50 MG tablet Take 1 tablet (50 mg total) by mouth 2 (two) times daily.   [name] dicyclomine (BENTYL) 10 MG capsule Take 1  capsule po TID prn intestinal cramping/diarrhea   . diphenoxylate-atropine (LOMOTIL) 2.5-0.025 MG tablet Take 1 tablet by mouth 4 (four) times daily as needed for diarrhea or loose stools.   . docusate sodium (STOOL SOFTENER) 100 MG capsule Take 200 mg by mouth daily.  06/07/2015: Received from: [facility]  . DULoxetine (CYMBALTA) 30 MG capsule Take 1 capsule po bid   . esomeprazole (NEXIUM) 20 MG capsule Take 20 mg by mouth daily at 12 noon.   [name] ESOMEPRAZOLE MAGNESIUM PO Take 40 mg by mouth.   . Fluticasone-Salmeterol (ADVAIR) 250-50 MCG/DOSE AEPB Inhale 1 puff into the lungs 2 (two) times daily.   . furosemide (LASIX) 20 MG tablet Take 1 tablet (20 mg total) by mouth daily.   [name] gabapentin (NEURONTIN) 300 MG capsule Take 2 cap twice a daily for neuropathy   . hydrochlorothiazide (HYDRODIURIL) 25 MG tablet Take 25 mg by mouth daily.   [name] HYDROcodone-acetaminophen (NORCO/VICODIN) 5-325 MG tablet Take 1 tablet by mouth every 6 (six) hours as needed for moderate pain.   [name] ibuprofen (ADVIL,MOTRIN) 800 MG tablet Take 800 mg by mouth. Reported on 06/22/2015 06/16/2015: Can take up to 3 tablets per day as needed.   [name] ipratropium-albuterol (DUONEB) 0.5-2.5 (3) MG/3ML SOLN Take 3 mLs by nebulization every 4 (four) hours as needed.   [name] levothyroxine (SYNTHROID) 50 MCG tablet Take 1 tablet (50 mcg total) by mouth daily.   . Lidocaine HCl 3 % GEL Reported on 06/22/2015 06/07/2015: Received from: [facility]  . lisinopril (ZESTRIL) 20 MG tablet Take 1 tablet (20 mg total)  by mouth daily.   . Magnesium 250 MG TABS Take by mouth. 06/22/2015: Reports taking once a day  . metoprolol succinate (TOPROL-XL) 25 MG 24 hr tablet Take 12.5 mg by mouth.   . ondansetron (ZOFRAN) 8 MG tablet Take 1 tablet (8 mg total) by mouth every 8 (eight) hours as needed for nausea or vomiting.   [name] oxybutynin (DITROPAN-XL) 5 MG 24 hr tablet Take 1 tablet (5 mg total) by mouth 2 (two) times daily.   . Potassium 99 MG TABS Take by mouth.  06/16/2015: Takes 2 tablets everyday.  [name] spironolactone (ALDACTONE) 25 MG tablet    . SPS 15 GM/60ML suspension    . traMADol (ULTRAM) 50 MG tablet Take 1 tablet (50 mg total) by mouth 2 (two) times daily. One tab po bid prn for pain   . [DISCONTINUED] amoxicillin-clavulanate (AUGMENTIN) 875-125 MG tablet Take 1 tablet by mouth 2 (two) times daily.    No facility-administered encounter medications on file as of 10/01/2019.    Surgical History: Past Surgical History:  Procedure Laterality Date  . ABCESS DRAINAGE  2016   Abdominal abcess due to diverticulitis  . caridoverter defibrillator  11/14/2017   ICD  . CATARACT EXTRACTION W/PHACO Left 10/26/2015   Procedure: CATARACT EXTRACTION PHACO AND INTRAOCULAR LENS PLACEMENT (IOC) LEFT EYE;  Surgeon: Leandrew Koyanagi, MD;  Location: [place];  Service: Ophthalmology;  Laterality: Left;  . CATARACT EXTRACTION W/PHACO Right 12/07/2015   Procedure: CATARACT EXTRACTION PHACO AND INTRAOCULAR LENS PLACEMENT (IOC);  Surgeon: Leandrew Koyanagi, MD;  Location: [place];  Service: Ophthalmology;  Laterality: Right;  . CHOLECYSTECTOMY    . FOOT SURGERY     Per patient, bilateral foot surgery.  [name] PELVIC FRACTURE SURGERY     Per patient for cancer.    Medical History: Past Medical History:  Diagnosis Date  . Arthritis   . Asthma   . Cancer ([place])   . Deep vein thrombosis (DVT) ([place]) 2015  . Depression   . GERD (gastroesophageal reflux disease)   . Hypertension   . Weakness of both legs     Family History: Family History  Problem Relation Age of Onset  . Depression Sister   . Heart disease Sister   . Early death Sister   . Depression Sister   . Depression Sister   . Depression Sister   . Depression Sister     Social History: Social History   Socioeconomic History  . Marital status: Married    Spouse name: Not on file  . Number of children: Not on file  . Years of education: Not on file  . Highest education  level: Not on file  Occupational History  . Not on file  Tobacco Use  . Smoking status: Never Smoker  . Smokeless tobacco: Never Used  Substance and Sexual Activity  . Alcohol use: No  . Drug use: No  . Sexual activity: Not Currently  Other Topics Concern  . Not on file  Social History Narrative  . Not on file   Social Determinants of Health   Financial Resource Strain:   . Difficulty of Paying Living Expenses:   Food Insecurity:   . Worried About Charity fundraiser in the Last Year:   . Arboriculturist in the Last Year:   Transportation Needs:   . Film/video editor (Medical):   [name] Lack of Transportation (Non-Medical):   Physical Activity:   . Days of Exercise per Week:   .  Minutes of Exercise per Session:   Stress:   . Feeling of Stress :   Social Connections:   . Frequency of Communication with Friends and Family:   . Frequency of Social Gatherings with Friends and Family:   . Attends Religious Services:   . Active Member of Clubs or Organizations:   . Attends Archivist Meetings:   [name] Marital Status:   Intimate Partner Violence:   . Fear of Current or Ex-Partner:   . Emotionally Abused:   [name] Physically Abused:   . Sexually Abused:     Vital Signs: Blood pressure 140/70, pulse 65, temperature (!) 97.3 F (36.3 C), resp. rate 16, height 5' (1.524 m), weight 265 lb (120.2 kg), SpO2 97 %.  Examination: General Appearance: The patient is well-developed, well-nourished, and in no distress. Skin: Gross inspection of skin unremarkable. Head: normocephalic, no gross deformities. Eyes: no gross deformities noted. ENT: ears appear grossly normal no exudates. Neck: Supple. No thyromegaly. No LAD. Respiratory: no rhonchi noted at this time. Cardiovascular: Normal S1 and S2 without murmur or rub. Extremities: No cyanosis. pulses are equal. Neurologic: Alert and oriented. No involuntary movements.  LABS: No results found for this or any previous visit  (from the past 2160 hour(s)).  Radiology: CT Chest Wo Contrast  Result Date: 04/21/2019 CLINICAL DATA:  Shortness of breath. Follow-up of pulmonary nodules. EXAM: CT CHEST WITHOUT CONTRAST TECHNIQUE: Multidetector CT imaging of the chest was performed following the standard protocol without IV contrast. COMPARISON:  CT scan dated 04/22/2014 FINDINGS: Cardiovascular: Aortic atherosclerosis. Extensive coronary artery calcifications. AICD in place. Heart size is normal. No pericardial effusion. Mediastinum/Nodes: No enlarged mediastinal or axillary lymph nodes. Thyroid gland, trachea, and esophagus demonstrate no significant findings. Lungs/Pleura: The areas of ground-glass density seen on the prior study have almost all completely resolved. There are a few tiny areas of stable pulmonary parenchymal scarring bilaterally. No infiltrates or effusions. No new lesions. Upper Abdomen: There is a small midline anterior abdominal wall hernia containing only fat just below the inferior margin of the left lobe of the liver. No other significant findings. Musculoskeletal: No acute bone abnormality. Severe chronic degenerative disc disease at the thoracolumbar junction. IMPRESSION: 1. The areas of ground-glass density seen on the prior study have almost all completely resolved. Minimal residual areas of stable parenchymal scarring. No new abnormalities. No further follow-up recommended. 2. No acute abnormalities. 3. Aortic Atherosclerosis (ICD10-I70.0). Electronically Signed   By: Lorriane Shire M.D.   On: 04/21/2019 16:15    No results found.  No results found.    Assessment and Plan: Patient Active Problem List   Diagnosis Date Noted  . Acquired hypothyroidism 08/02/2019  . Other symptoms and signs involving the nervous system 04/06/2019  . Memory loss 04/06/2019  . Acute upper respiratory infection 01/27/2019  . Exposure to COVID-19 virus 01/27/2019  . Intermittent claudication ([place]) 11/01/2018  .  Moderate major depression ([place]) 09/22/2018  . Peripheral arterial disease ([place]) 06/25/2018  . Diverticulitis of large intestine without perforation or abscess without bleeding 05/29/2018  . Other fatigue 03/20/2018  . Dysuria 03/20/2018  . Gastroenteritis, acute 01/26/2018  . Diarrhea 01/26/2018  . Nausea 01/26/2018  . Chronic systolic heart failure ([place]) 01/26/2018  . Cough variant asthma 12/07/2017  . Need for vaccination against Streptococcus pneumoniae using pneumococcal conjugate vaccine 13 12/07/2017  . AICD lead displacement 11/20/2017  . Cardiac resynchronization therapy defibrillator (CRT-D) in place 11/14/2017  . Dilated cardiomyopathy ([place]) 09/04/2017  . Left bundle  branch block 09/04/2017  . OSA (obstructive sleep apnea) 07/31/2017  . Abscess of female pelvis 05/07/2014  . Acute deep vein thrombosis (DVT) of femoral vein of left lower extremity ([place]) 03/29/2014  . Lumbago-sciatica due to displacement of lumbar intervertebral disc 12/28/2013  . Chronic bilateral low back pain with bilateral sciatica 12/28/2013  . Neuropathy due to chemotherapeutic drug ([place]) 11/02/2013  . Encounter for general adult medical examination with abnormal findings 06/01/2013  . Morbid obesity ([place]) 04/20/2013  . Essential hypertension 04/13/2013  . Malignant neoplasm of endometrium ([place]) 03/30/2013  . Shortness of breath 03/30/2013  . CMC arthritis, thumb, degenerative 01/02/2013    1. OSA continue with present management she has been doing better overall.  Encourage compliance with recommended treatments.  Work on weight loss diet and exercise. 2. SOB at baseline inhalers medical management. 3. Chronic Pain seems to be better controlled she continues to use pain medications judiciously 4. CHF systolic monitor fluid status diuresis tolerated.  General Counseling: I have discussed the findings of the evaluation and examination with Diana Bernard.  I have also discussed any further diagnostic evaluation  thatmay be needed or ordered today. Diana Bernard verbalizes understanding of the findings of todays visit. We also reviewed her medications today and discussed drug interactions and side effects including but not limited excessive drowsiness and altered mental states. We also discussed that there is always a risk not just to her but also people around her. she has been encouraged to call the office with any questions or concerns that should arise related to todays visit.  Orders Placed This Encounter  Procedures  . DG Bone Density    Standing Status:   Future    Standing Expiration Date:   12/01/2019    Order Specific Question:   Reason for Exam (SYMPTOM  OR DIAGNOSIS REQUIRED)    Answer:   osteoporosis    Order Specific Question:   Preferred imaging location?    Answer:   [facility]  . ECHOCARDIOGRAM COMPLETE    Standing Status:   Future    Standing Expiration Date:   09/30/2020    Order Specific Question:   Where should this test be performed    Answer:   External    Order Specific Question:   Perflutren DEFINITY (image enhancing agent) should be administered unless hypersensitivity or allergy exist    Answer:   Administer Perflutren    Order Specific Question:   Reason for exam-Echo    Answer:   CHF-Acute Systolic  [id number] / [id number]  . Spirometry with Graph    Order Specific Question:   Where should this test be performed?    Answer:   Other     Time spent: 25min  I have personally obtained a history, examined the patient, evaluated laboratory and imaging results, formulated the assessment and plan and placed orders.    Allyne Gee, MD [facility] Pulmonary and Critical Care Sleep medicine

## 2019-10-07 ENCOUNTER — Telehealth: Payer: Self-pay

## 2019-10-07 NOTE — Telephone Encounter (Signed)
Called lmom informing patient of appointment on 10/09/2019. klh

## 2019-10-07 NOTE — Telephone Encounter (Signed)
Confirmed appointment on 10/09/2019 and screened for covid. klh

## 2019-10-08 DIAGNOSIS — R0600 Dyspnea, unspecified: Secondary | ICD-10-CM | POA: Diagnosis not present

## 2019-10-08 DIAGNOSIS — G629 Polyneuropathy, unspecified: Secondary | ICD-10-CM | POA: Diagnosis not present

## 2019-10-08 DIAGNOSIS — Z5111 Encounter for antineoplastic chemotherapy: Secondary | ICD-10-CM | POA: Diagnosis not present

## 2019-10-08 DIAGNOSIS — R2689 Other abnormalities of gait and mobility: Secondary | ICD-10-CM | POA: Diagnosis not present

## 2019-10-08 DIAGNOSIS — C541 Malignant neoplasm of endometrium: Secondary | ICD-10-CM | POA: Diagnosis not present

## 2019-10-08 DIAGNOSIS — R790 Abnormal level of blood mineral: Secondary | ICD-10-CM | POA: Diagnosis not present

## 2019-10-09 ENCOUNTER — Ambulatory Visit: Payer: Medicare HMO

## 2019-10-09 ENCOUNTER — Other Ambulatory Visit: Payer: Self-pay

## 2019-10-09 DIAGNOSIS — I5022 Chronic systolic (congestive) heart failure: Secondary | ICD-10-CM | POA: Diagnosis not present

## 2019-10-13 ENCOUNTER — Other Ambulatory Visit: Payer: Self-pay

## 2019-10-13 ENCOUNTER — Ambulatory Visit
Admission: RE | Admit: 2019-10-13 | Discharge: 2019-10-13 | Disposition: A | Discharge status: Home / Self Care | Payer: Medicare HMO | Source: Ambulatory Visit | Attending: Internal Medicine | Admitting: Internal Medicine

## 2019-10-13 DIAGNOSIS — M818 Other osteoporosis without current pathological fracture: Secondary | ICD-10-CM | POA: Diagnosis not present

## 2019-10-13 DIAGNOSIS — M810 Age-related osteoporosis without current pathological fracture: Secondary | ICD-10-CM | POA: Insufficient documentation

## 2019-10-13 DIAGNOSIS — M8580 Other specified disorders of bone density and structure, unspecified site: Secondary | ICD-10-CM | POA: Diagnosis present

## 2019-10-13 DIAGNOSIS — Z780 Asymptomatic menopausal state: Secondary | ICD-10-CM

## 2019-10-15 ENCOUNTER — Other Ambulatory Visit: Payer: Self-pay

## 2019-10-15 MED ORDER — FLUTICASONE-SALMETEROL 250-50 MCG/DOSE IN AEPB: 1.0000 | INHALATION_SPRAY | Freq: Two times a day (BID) | RESPIRATORY_TRACT | 0 refills | Status: DC

## 2019-10-19 ENCOUNTER — Other Ambulatory Visit: Payer: Self-pay | Admitting: Nurse Practitioner

## 2019-10-19 DIAGNOSIS — E039 Hypothyroidism, unspecified: Secondary | ICD-10-CM | POA: Diagnosis not present

## 2019-10-19 DIAGNOSIS — R7301 Impaired fasting glucose: Secondary | ICD-10-CM | POA: Diagnosis not present

## 2019-10-20 ENCOUNTER — Telehealth: Payer: Self-pay

## 2019-10-20 LAB — BASIC METABOLIC PANEL
BUN/Creatinine Ratio: 19 (ref 12–28)
BUN: 20 mg/dL (ref 8–27)
CO2: 26 mmol/L (ref 20–29)
Calcium: 10 mg/dL (ref 8.7–10.3)
Chloride: 101 mmol/L (ref 96–106)
Creatinine, Ser: 1.07 mg/dL — ABNORMAL HIGH (ref 0.57–1.00)
GFR calc Af Amer: 60 mL/min/{1.73_m2} (ref >59)
GFR calc non Af Amer: 52 mL/min/{1.73_m2} — ABNORMAL LOW (ref >59)
Glucose: 86 mg/dL (ref 65–99)
Potassium: 4.8 mmol/L (ref 3.5–5.2)
Sodium: 142 mmol/L (ref 134–144)

## 2019-10-20 LAB — HGB A1C W/O EAG: Hgb A1c MFr Bld: 5.7 % — ABNORMAL HIGH (ref 4.8–5.6)

## 2019-10-20 LAB — TSH: TSH: 1.77 u[IU]/mL (ref 0.450–4.500)

## 2019-10-20 LAB — T4, FREE: Free T4: 0.94 ng/dL (ref 0.82–1.77)

## 2019-10-20 NOTE — Telephone Encounter (Signed)
Confirmed and screened for 10-22-19 ov. 

## 2019-10-22 ENCOUNTER — Ambulatory Visit (INDEPENDENT_AMBULATORY_CARE_PROVIDER_SITE_OTHER): Payer: Medicare HMO | Admitting: Nurse Practitioner

## 2019-10-22 ENCOUNTER — Encounter: Payer: Self-pay | Admitting: Nurse Practitioner

## 2019-10-22 ENCOUNTER — Other Ambulatory Visit: Payer: Self-pay

## 2019-10-22 VITALS — BP 130/82 | HR 79 | Temp 97.3°F | Resp 16 | Ht 60.0 in | Wt 265.8 lb

## 2019-10-22 DIAGNOSIS — R300 Dysuria: Secondary | ICD-10-CM | POA: Diagnosis not present

## 2019-10-22 DIAGNOSIS — Z0001 Encounter for general adult medical examination with abnormal findings: Secondary | ICD-10-CM

## 2019-10-22 DIAGNOSIS — Z9989 Dependence on other enabling machines and devices: Secondary | ICD-10-CM

## 2019-10-22 DIAGNOSIS — G4733 Obstructive sleep apnea (adult) (pediatric): Secondary | ICD-10-CM | POA: Diagnosis not present

## 2019-10-22 DIAGNOSIS — M810 Age-related osteoporosis without current pathological fracture: Secondary | ICD-10-CM | POA: Diagnosis not present

## 2019-10-22 DIAGNOSIS — I10 Essential (primary) hypertension: Secondary | ICD-10-CM

## 2019-10-22 DIAGNOSIS — Z1231 Encounter for screening mammogram for malignant neoplasm of breast: Secondary | ICD-10-CM

## 2019-10-22 DIAGNOSIS — E039 Hypothyroidism, unspecified: Secondary | ICD-10-CM

## 2019-10-22 DIAGNOSIS — M5442 Lumbago with sciatica, left side: Secondary | ICD-10-CM

## 2019-10-22 DIAGNOSIS — R1032 Left lower quadrant pain: Secondary | ICD-10-CM | POA: Diagnosis not present

## 2019-10-22 DIAGNOSIS — G8929 Other chronic pain: Secondary | ICD-10-CM

## 2019-10-22 DIAGNOSIS — M5441 Lumbago with sciatica, right side: Secondary | ICD-10-CM

## 2019-10-22 MED ORDER — HYDROCODONE-ACETAMINOPHEN 5-325 MG PO TABS: 1.0000 | ORAL_TABLET | Freq: Four times a day (QID) | ORAL | 0 refills | Status: DC | PRN

## 2019-10-22 MED ORDER — LEVOTHYROXINE SODIUM 50 MCG PO TABS: 50.0000 ug | ORAL_TABLET | Freq: Every day | ORAL | 1 refills | Status: DC

## 2019-10-22 NOTE — Progress Notes (Signed)
[facility] [place], [place] [address]  Internal MEDICINE  Office Visit Note  Patient Name: Diana Bernard  [id number]  [id number]  Date of Service: 10/23/2019   Pt is here for routine health maintenance examination  Chief Complaint  Patient presents with  . Medicare Wellness  . Hypertension  . Depression  . Gastroesophageal Reflux  . Quality Metric Gaps    Mammogram  . Abdominal Pain    Thinks could be caused by diverticulitis     The patient is here for health maintenance exam. She is doing better overall. Had injections into her hip. Left side is doing better. Right side still bothering her some. Will go back to see orthopedics for further evaluation. Is doing more physical activity around the house.  She states that she has been having some intermittent left lower quadrant abdominal tenderness. This pain comes and goes. Feels like cramping or muscle spasms. She does have history of diverticulitis in this area of the abdomen. She has also been treated for uterine cancer and pain she was having prior to surgery was in this area. Pain is more severe and occurs more often when she is exerting herself.  She had boe density prir to this visit. Bone density indicates that she has osteopenia in the lumbar spine and osteoporosis in the femur. She does take extra vitamin d supplement. She is due to have screening mammogram.  Thyroid panel checked prior to this visit indicating thyroid panel has returned to normal limits on levothyroxine at 2mg daily.     Current Medication: Outpatient Encounter Medications as of 10/22/2019  Medication Sig Note  . albuterol (VENTOLIN HFA) 108 (90 Base) MCG/ACT inhaler Inhale 2 puffs into the lungs every 6 (six) hours as needed for wheezing or shortness of breath.   .Marland KitchenamLODipine (NORVASC) 10 MG tablet Take 1 tab po daily   . cholecalciferol (VITAMIN D) 1000 units tablet Take 1,000 Units by mouth daily.   . cilostazol  (PLETAL) 50 MG tablet Take 1 tablet (50 mg total) by mouth 2 (two) times daily.   .Marland Kitchendicyclomine (BENTYL) 10 MG capsule Take 1 capsule po TID prn intestinal cramping/diarrhea   . diphenoxylate-atropine (LOMOTIL) 2.5-0.025 MG tablet Take 1 tablet by mouth 4 (four) times daily as needed for diarrhea or loose stools.   . docusate sodium (STOOL SOFTENER) 100 MG capsule Take 200 mg by mouth daily.  06[facility]07/2015: [facility] . DULoxetine (CYMBALTA) 30 MG capsule Take 1 capsule po bid   . esomeprazole (NEXIUM) 20 MG capsule Take 20 mg by mouth daily at 12 noon.   .Marland KitchenESOMEPRAZOLE MAGNESIUM PO Take 40 mg by mouth.   . Fluticasone-Salmeterol (ADVAIR) 250-50 MCG/DOSE AEPB Inhale 1 puff into the lungs 2 (two) times daily.   . furosemide (LASIX) 20 MG tablet Take 1 tablet (20 mg total) by mouth daily.   .Marland Kitchengabapentin (NEURONTIN) 300 MG capsule Take 2 cap twice a daily for neuropathy   . hydrochlorothiazide (HYDRODIURIL) 25 MG tablet Take 25 mg by mouth daily.   .Marland KitchenHYDROcodone-acetaminophen (NORCO/VICODIN) 5-325 MG tablet Take 1 tablet by mouth every 6 (six) hours as needed for moderate pain.   .Marland Kitchenibuprofen (ADVIL,MOTRIN) 800 MG tablet Take 800 mg by mouth. Reported on 06/22/2015 06/16/2015: Can take up to 3 tablets per day as needed.   .Marland Kitchenipratropium-albuterol (DUONEB) 0.5-2.5 (3) MG/3ML SOLN Take 3 mLs by nebulization every 4 (four) hours as needed.   .Marland Kitchenlevothyroxine (  SYNTHROID) 50 MCG tablet Take 1 tablet (50 mcg total) by mouth daily.   . Lidocaine HCl 3 % GEL Reported on 06/22/2015 06/07/2015: Received from: [facility]  . lisinopril (ZESTRIL) 20 MG tablet Take 1 tablet (20 mg total) by mouth daily.   . Magnesium 250 MG TABS Take by mouth. 06/22/2015: Reports taking once a day  . metoprolol succinate (TOPROL-XL) 25 MG 24 hr tablet Take 12.5 mg by mouth.   . ondansetron (ZOFRAN) 8 MG tablet Take 1 tablet (8 mg total) by mouth every 8 (eight) hours as needed for nausea or vomiting.   [name] oxybutynin  (DITROPAN-XL) 5 MG 24 hr tablet Take 1 tablet (5 mg total) by mouth 2 (two) times daily.   . Potassium 99 MG TABS Take by mouth. 06/16/2015: Takes 2 tablets everyday.  [name] spironolactone (ALDACTONE) 25 MG tablet    . SPS 15 GM/60ML suspension    . traMADol (ULTRAM) 50 MG tablet Take 1 tablet (50 mg total) by mouth 2 (two) times daily. One tab po bid prn for pain   . [DISCONTINUED] HYDROcodone-acetaminophen (NORCO/VICODIN) 5-325 MG tablet Take 1 tablet by mouth every 6 (six) hours as needed for moderate pain.   . [DISCONTINUED] levothyroxine (SYNTHROID) 50 MCG tablet Take 1 tablet (50 mcg total) by mouth daily.   [name] alendronate (FOSAMAX) 70 MG tablet Take 1 tablet (70 mg total) by mouth every 7 (seven) days. Take with a full glass of water on an empty stomach.    No facility-administered encounter medications on file as of 10/22/2019.    Surgical History: Past Surgical History:  Procedure Laterality Date  . ABCESS DRAINAGE  2016   Abdominal abcess due to diverticulitis  . caridoverter defibrillator  11/14/2017   ICD  . CATARACT EXTRACTION W/PHACO Left 10/26/2015   Procedure: CATARACT EXTRACTION PHACO AND INTRAOCULAR LENS PLACEMENT (IOC) LEFT EYE;  Surgeon: Leandrew Koyanagi, MD;  Location: [facility];  Service: Ophthalmology;  Laterality: Left;  . CATARACT EXTRACTION W/PHACO Right 12/07/2015   Procedure: CATARACT EXTRACTION PHACO AND INTRAOCULAR LENS PLACEMENT (IOC);  Surgeon: Leandrew Koyanagi, MD;  Location: [facility];  Service: Ophthalmology;  Laterality: Right;  . CHOLECYSTECTOMY    . FOOT SURGERY     Per patient, bilateral foot surgery.  [name] PELVIC FRACTURE SURGERY     Per patient for cancer.    Medical History: Past Medical History:  Diagnosis Date  . Arthritis   . Asthma   . Cancer ([place])   . Deep vein thrombosis (DVT) ([place]) 2015  . Depression   . GERD (gastroesophageal reflux disease)   . Hypertension   . Weakness of both legs     Family History: Family  History  Problem Relation Age of Onset  . Depression Sister   . Heart disease Sister   . Early death Sister   . Depression Sister   . Depression Sister   . Depression Sister   . Depression Sister       Review of Systems  Constitutional: Negative for activity change, chills, fatigue and unexpected weight change.       Patient reports improved energy and activity levels since seeing her orthopedic provider and getting injections into her hips.   HENT: Negative for congestion, postnasal drip, rhinorrhea, sneezing and sore throat.   Respiratory: Negative for cough, chest tightness, shortness of breath and wheezing.   Cardiovascular: Negative for chest pain and palpitations.  Gastrointestinal: Positive for abdominal pain. Negative for constipation, diarrhea, nausea and vomiting.  The patient states that she is having intermittent abdominal cramping. Feels like muscle spasms, mostly in the lower left part of the belly.   Endocrine: Negative for cold intolerance, heat intolerance, polydipsia and polyuria.       Recent thyroid panel is normal  Genitourinary: Negative for dysuria, flank pain, frequency and urgency.  Musculoskeletal: Positive for arthralgias, back pain and myalgias. Negative for joint swelling and neck pain.       Severe low back pain interfering with her ROM. She is having trouble participating in her activities of daily living. This has improved some since getting steroid injections into the joints of both hips. Does still have intermittent severe pain.   Skin: Negative for rash.  Allergic/Immunologic: Negative for environmental allergies.  Neurological: Negative for dizziness, tremors, numbness and headaches.  Hematological: Negative for adenopathy. Does not bruise/bleed easily.  Psychiatric/Behavioral: Positive for dysphoric mood. Negative for behavioral problems (Depression), sleep disturbance and suicidal ideas. The patient is not nervous/anxious.      Today's  Vitals   10/22/19 1508  BP: 130/82  Pulse: 79  Resp: 16  Temp: (!) 97.3 F (36.3 C)  SpO2: 96%  Weight: 265 lb 12.8 oz (120.6 kg)  Height: 5' (1.524 m)   Body mass index is 51.91 kg/m.  Physical Exam Vitals and nursing note reviewed.  Constitutional:      General: She is in acute distress.     Appearance: She is well-developed. She is obese. She is not diaphoretic.  HENT:     Head: Normocephalic and atraumatic.     Mouth/Throat:     Pharynx: No oropharyngeal exudate.  Eyes:     Pupils: Pupils are equal, round, and reactive to light.  Neck:     Thyroid: No thyromegaly.     Vascular: No carotid bruit or JVD.     Trachea: No tracheal deviation.  Cardiovascular:     Rate and Rhythm: Normal rate and regular rhythm.     Pulses: Normal pulses.     Heart sounds: Normal heart sounds. No murmur heard.  No friction rub. No gallop.   Pulmonary:     Effort: Pulmonary effort is normal. No respiratory distress.     Breath sounds: Normal breath sounds. No wheezing or rales.  Chest:     Chest wall: No tenderness.     Breasts:        Right: Normal. No swelling, bleeding, inverted nipple, mass, nipple discharge, skin change or tenderness.        Left: Normal. No swelling, bleeding, inverted nipple, mass, nipple discharge, skin change or tenderness.  Abdominal:     General: Bowel sounds are normal.     Palpations: Abdomen is soft.     Tenderness: There is abdominal tenderness. There is guarding.     Comments: When moving onto the examination table. Patient experienced several episodes of severe abdominal pain. Grimacing was present with light palpation. Patient guarding her abdomen during times of acute cramping.   Musculoskeletal:        General: Normal range of motion.     Cervical back: Normal range of motion and neck supple.     Comments:  Moderate to severe lower back pain. This is especially severe with bending and twisting at the waist. improved some since her most recent visit.  No visible or palpable abnormalities at this time.    Lymphadenopathy:     Cervical: No cervical adenopathy.     Upper Body:     Right  upper body: No axillary adenopathy.     Left upper body: No axillary adenopathy.  Skin:    General: Skin is warm and dry.  Neurological:     General: No focal deficit present.     Mental Status: She is alert and oriented to person, place, and time.     Cranial Nerves: No cranial nerve deficit.  Psychiatric:        Mood and Affect: Mood normal.        Behavior: Behavior normal.        Thought Content: Thought content normal.        Judgment: Judgment normal.    Depression screen [facility] 2/9 10/22/2019 07/21/2019 04/06/2019 10/27/2018 06/25/2018  Decreased Interest 0 1 1 0 0  Down, Depressed, Hopeless 0 2 0 0 0  PHQ - 2 Score 0 3 1 0 0  Altered sleeping - 3 - - -  Tired, decreased energy - 3 - - -  Change in appetite - 3 - - -  Feeling bad or failure about yourself  - 3 - - -  Trouble concentrating - 3 - - -  Moving slowly or fidgety/restless - 3 - - -  Suicidal thoughts - 0 - - -  PHQ-9 Score - 21 - - -  Difficult doing work/chores - Somewhat difficult - - -    Functional Status Survey: Is the patient deaf or have difficulty hearing?: Yes (In left ear) Does the patient have difficulty seeing, even when wearing glasses/contacts?: Yes Does the patient have difficulty concentrating, remembering, or making decisions?: No Does the patient have difficulty walking or climbing stairs?: Yes (does not climb stairs) Does the patient have difficulty dressing or bathing?: No Does the patient have difficulty doing errands alone such as visiting a doctor's office or shopping?: No  MMSE - [place] Exam 10/22/2019 03/18/2018  Orientation to time 5 5  Orientation to Place 5 5  Registration 3 3  Attention/ Calculation 5 5  Recall 3 3  Language- name 2 objects 2 2  Language- repeat 1 1  Language- follow 3 step command 3 3  Language- read & follow  direction 1 1  Write a sentence 1 1  Copy design 1 1  Total score 30 30    Fall Risk  10/22/2019 07/21/2019 04/06/2019 10/27/2018 06/25/2018  Falls in the past year? 0 0 0 1 0  Number falls in past yr: - - - 0 -  Injury with Fall? - - - 0 -  Risk for fall due to : - - - - -  Follow up - - - - -      LABS: Recent Results (from the past 2160 hour(s))  Basic metabolic panel     Status: Abnormal   Collection Time: 10/19/19 10:13 AM  Result Value Ref Range   Glucose 86 65 - 99 mg/dL   BUN 20 8 - 27 mg/dL   Creatinine, Ser 1.07 (H) 0.57 - 1.00 mg/dL   GFR calc non Af Amer 52 (L) >59 mL/min/1.73   GFR calc Af Amer 60 >59 mL/min/1.73    Comment: **[facility] currently reports eGFR in compliance with the current**   recommendations of the [employer]. [facility] will   update reporting as new guidelines are published from the NKF-ASN   Task force.    BUN/Creatinine Ratio 19 12 - 28   Sodium 142 134 - 144 mmol/L   Potassium 4.8 3.5 - 5.2 mmol/L   Chloride  101 96 - 106 mmol/L   CO2 26 20 - 29 mmol/L   Calcium 10.0 8.7 - 10.3 mg/dL  Hgb A1c w/o eAG     Status: Abnormal   Collection Time: 10/19/19 10:13 AM  Result Value Ref Range   Hgb A1c MFr Bld 5.7 (H) 4.8 - 5.6 %    Comment:          Prediabetes: 5.7 - 6.4          Diabetes: >6.4          Glycemic control for adults with diabetes: <7.0   T4, free     Status: None   Collection Time: 10/19/19 10:13 AM  Result Value Ref Range   Free T4 0.94 0.82 - 1.77 ng/dL  TSH     Status: None   Collection Time: 10/19/19 10:13 AM  Result Value Ref Range   TSH 1.770 0.450 - 4.500 uIU/mL  Urinalysis, Routine w reflex microscopic     Status: Abnormal   Collection Time: 10/22/19  3:08 PM  Result Value Ref Range   Specific Gravity, UA 1.018 1.005 - 1.030   pH, UA 7.5 5.0 - 7.5   Color, UA Yellow Yellow   Appearance Ur Clear Clear   Leukocytes,UA 1+ (A) Negative   Protein,UA Negative Negative/Trace   Glucose, UA Negative Negative    Ketones, UA Negative Negative   RBC, UA Negative Negative   Bilirubin, UA Negative Negative   Urobilinogen, Ur 0.2 0.2 - 1.0 mg/dL   Nitrite, UA Negative Negative   Microscopic Examination See below:     Comment: Microscopic was indicated and was performed.  Microscopic Examination     Status: None   Collection Time: 10/22/19  3:08 PM   Urine  Result Value Ref Range   WBC, UA None seen 0 - 5 /hpf   RBC None seen 0 - 2 /hpf   Epithelial Cells (non renal) 0-10 0 - 10 /hpf   Casts None seen None seen /lpf   Bacteria, UA Few None seen/Few    Assessment/Plan: 1. Encounter for general adult medical examination with abnormal findings Annual health maintenance exam today.   2. Left lower quadrant abdominal pain Pain severe in left lower quadrant of the abdomen. Patient with history of diverticulitis and uterine/endometrial cancer. Will get CT of abdomen and pelvis for further evaluation.  - CT Abdomen Pelvis W Contrast; Future  3. Age-related osteoporosis without current pathological fracture Reviewed bone density test with the patient. She has osteoporosis in femur and osteopenia in lumbar spine. Start fosamax '70mg'$  weekly.  - alendronate (FOSAMAX) 70 MG tablet; Take 1 tablet (70 mg total) by mouth every 7 (seven) days. Take with a full glass of water on an empty stomach.  Dispense: 12 tablet; Refill: 3  4. Acquired hypothyroidism Thyroid panel normal. Continue levothyroxine as prescribed.  - levothyroxine (SYNTHROID) 50 MCG tablet; Take 1 tablet (50 mcg total) by mouth daily.  Dispense: 90 tablet; Refill: 1  5. Essential hypertension Currently stable. Continue bp medication as prescribed   6. OSA on CPAP conitnue regular visits with Dr. Devona Konig for CPAP management.  7. Chronic bilateral low back pain with bilateral sciatica May take hydrocodone/apap 5/'325mg'$  as needed and as prescribed for severe pain. Prescription for #30 tablets sent to her pharmacy.  -  HYDROcodone-acetaminophen (NORCO/VICODIN) 5-325 MG tablet; Take 1 tablet by mouth every 6 (six) hours as needed for moderate pain.  Dispense: 30 tablet; Refill: 0  8. Encounter for  screening mammogram for malignant neoplasm of breast - MM DIGITAL SCREENING BILATERAL; Future  9. Dysuria - Urinalysis, Routine w reflex microscopic  General Counseling: Larene verbalizes understanding of the findings of todays visit and agrees with plan of treatment. I have discussed any further diagnostic evaluation that may be needed or ordered today. We also reviewed her medications today. she has been encouraged to call the office with any questions or concerns that should arise related to todays visit.    Counseling:  This patient was seen by Leretha Pol FNP Collaboration with Dr Lavera Guise as a part of collaborative care agreement  Orders Placed This Encounter  Procedures  . Microscopic Examination  . MM DIGITAL SCREENING BILATERAL  . CT Abdomen Pelvis W Contrast  . Urinalysis, Routine w reflex microscopic    Meds ordered this encounter  Medications  . HYDROcodone-acetaminophen (NORCO/VICODIN) 5-325 MG tablet    Sig: Take 1 tablet by mouth every 6 (six) hours as needed for moderate pain.    Dispense:  30 tablet    Refill:  0    Order Specific Question:   Supervising Provider    Answer:   Lavera Guise [[id number]]  . levothyroxine (SYNTHROID) 50 MCG tablet    Sig: Take 1 tablet (50 mcg total) by mouth daily.    Dispense:  90 tablet    Refill:  1    Order Specific Question:   Supervising Provider    Answer:   Lavera Guise [[id number]]  . alendronate (FOSAMAX) 70 MG tablet    Sig: Take 1 tablet (70 mg total) by mouth every 7 (seven) days. Take with a full glass of water on an empty stomach.    Dispense:  12 tablet    Refill:  3    Order Specific Question:   Supervising Provider    Answer:   Lavera Guise [[id number]]    Total time spent: 68 Minutes  Time spent includes review of chart, medications,  test results, and follow up plan with the patient.     Lavera Guise, MD  Internal Medicine

## 2019-10-23 DIAGNOSIS — Z1231 Encounter for screening mammogram for malignant neoplasm of breast: Secondary | ICD-10-CM | POA: Insufficient documentation

## 2019-10-23 DIAGNOSIS — M810 Age-related osteoporosis without current pathological fracture: Secondary | ICD-10-CM | POA: Insufficient documentation

## 2019-10-23 DIAGNOSIS — R1032 Left lower quadrant pain: Secondary | ICD-10-CM | POA: Insufficient documentation

## 2019-10-23 LAB — MICROSCOPIC EXAMINATION
Casts: NONE SEEN /lpf
RBC, Urine: NONE SEEN /hpf (ref 0–2)
WBC, UA: NONE SEEN /hpf (ref 0–5)

## 2019-10-23 LAB — URINALYSIS, ROUTINE W REFLEX MICROSCOPIC
Bilirubin, UA: NEGATIVE
Glucose, UA: NEGATIVE
Ketones, UA: NEGATIVE
Nitrite, UA: NEGATIVE
Protein,UA: NEGATIVE
RBC, UA: NEGATIVE
Specific Gravity, UA: 1.018 (ref 1.005–1.030)
Urobilinogen, Ur: 0.2 mg/dL (ref 0.2–1.0)
pH, UA: 7.5 (ref 5.0–7.5)

## 2019-10-23 MED ORDER — ALENDRONATE SODIUM 70 MG PO TABS: 70.0000 mg | ORAL_TABLET | ORAL | 3 refills | Status: DC

## 2019-10-27 ENCOUNTER — Telehealth: Payer: Self-pay

## 2019-10-27 NOTE — Telephone Encounter (Signed)
Left a message for pt to call and schedule [facility] for ct scan, order saved at my desk. Beth

## 2019-10-28 DIAGNOSIS — C55 Malignant neoplasm of uterus, part unspecified: Secondary | ICD-10-CM | POA: Diagnosis not present

## 2019-10-28 DIAGNOSIS — R1032 Left lower quadrant pain: Secondary | ICD-10-CM | POA: Diagnosis not present

## 2019-10-28 DIAGNOSIS — K5792 Diverticulitis of intestine, part unspecified, without perforation or abscess without bleeding: Secondary | ICD-10-CM | POA: Diagnosis not present

## 2019-10-28 NOTE — Progress Notes (Signed)
Overall, labs good. Discuss at visit 10/30/2019

## 2019-10-30 ENCOUNTER — Ambulatory Visit: Payer: Medicare HMO | Admitting: Nurse Practitioner

## 2019-10-31 DIAGNOSIS — G4733 Obstructive sleep apnea (adult) (pediatric): Secondary | ICD-10-CM | POA: Diagnosis not present

## 2019-11-03 ENCOUNTER — Ambulatory Visit (INDEPENDENT_AMBULATORY_CARE_PROVIDER_SITE_OTHER): Payer: Medicare HMO | Admitting: Nurse Practitioner

## 2019-11-03 ENCOUNTER — Other Ambulatory Visit: Payer: Self-pay

## 2019-11-03 ENCOUNTER — Encounter: Payer: Self-pay | Admitting: Nurse Practitioner

## 2019-11-03 VITALS — BP 152/72 | HR 73 | Temp 97.8°F | Resp 16 | Ht 60.0 in | Wt 263.0 lb

## 2019-11-03 DIAGNOSIS — R1032 Left lower quadrant pain: Secondary | ICD-10-CM

## 2019-11-03 DIAGNOSIS — Z8542 Personal history of malignant neoplasm of other parts of uterus: Secondary | ICD-10-CM | POA: Insufficient documentation

## 2019-11-03 DIAGNOSIS — F321 Major depressive disorder, single episode, moderate: Secondary | ICD-10-CM | POA: Diagnosis not present

## 2019-11-03 DIAGNOSIS — M5127 Other intervertebral disc displacement, lumbosacral region: Secondary | ICD-10-CM | POA: Diagnosis not present

## 2019-11-03 NOTE — Progress Notes (Signed)
[facility] [place], [place] [address]  Internal MEDICINE  Office Visit Note  Patient Name: Diana Bernard  [id number]  [id number]  Date of Service: 11/03/2019  Chief Complaint  Patient presents with  . Follow-up    CT  . Depression  . Hypertension    The patient is here for follow up. Had been  having some intermittent left lower quadrant abdominal tenderness. This is now mostly pelvic in nature. This pain comes and goes. Feels like cramping or muscle spasms. She does have history of diverticulitis in this area of the abdomen. She has also been treated for uterine cancer and pain she was having prior to surgery was in this area. Pain is more severe and occurs more often when she is exerting herself. At her last visit, pain was se severe, she was guarding and grimacing during examination. She had CT scan of the abdomen and pelvis since this past visit. CT showed --No evidence of acute diverticulitis or acute abnormality in the abdomen or pelvis and No evidence of metastatic disease in the abdomen or pelvis.  She states that she did have some significant diarrhea after drinking contrast dye for CT scan. Pelvic pain/tenderness did get worse after the scan. States that it has gotten so bad it hurts to even walk.        Current Medication: Outpatient Encounter Medications as of 11/03/2019  Medication Sig Note  . albuterol (VENTOLIN HFA) 108 (90 Base) MCG/ACT inhaler Inhale 2 puffs into the lungs every 6 (six) hours as needed for wheezing or shortness of breath.   [name] alendronate (FOSAMAX) 70 MG tablet Take 1 tablet (70 mg total) by mouth every 7 (seven) days. Take with a full glass of water on an empty stomach.   [name] amLODipine (NORVASC) 10 MG tablet Take 1 tab po daily   . cholecalciferol (VITAMIN D) 1000 units tablet Take 1,000 Units by mouth daily.   . cilostazol (PLETAL) 50 MG tablet Take 1 tablet (50 mg total) by mouth 2 (two) times daily.   [name] dicyclomine  (BENTYL) 10 MG capsule Take 1 capsule po TID prn intestinal cramping/diarrhea   . diphenoxylate-atropine (LOMOTIL) 2.5-0.025 MG tablet Take 1 tablet by mouth 4 (four) times daily as needed for diarrhea or loose stools.   . docusate sodium (STOOL SOFTENER) 100 MG capsule Take 200 mg by mouth daily.  06/07/2015: Received from: [facility]  . DULoxetine (CYMBALTA) 30 MG capsule Take 1 capsule po bid   . esomeprazole (NEXIUM) 20 MG capsule Take 20 mg by mouth daily at 12 noon.   [name] ESOMEPRAZOLE MAGNESIUM PO Take 40 mg by mouth.   . Fluticasone-Salmeterol (ADVAIR) 250-50 MCG/DOSE AEPB Inhale 1 puff into the lungs 2 (two) times daily.   . furosemide (LASIX) 20 MG tablet Take 1 tablet (20 mg total) by mouth daily.   [name] gabapentin (NEURONTIN) 300 MG capsule Take 2 cap twice a daily for neuropathy   . hydrochlorothiazide (HYDRODIURIL) 25 MG tablet Take 25 mg by mouth daily.   [name] HYDROcodone-acetaminophen (NORCO/VICODIN) 5-325 MG tablet Take 1 tablet by mouth every 6 (six) hours as needed for moderate pain.   [name] ibuprofen (ADVIL,MOTRIN) 800 MG tablet Take 800 mg by mouth. Reported on 06/22/2015 06/16/2015: Can take up to 3 tablets per day as needed.   [name] ipratropium-albuterol (DUONEB) 0.5-2.5 (3) MG/3ML SOLN Take 3 mLs by nebulization every 4 (four) hours as needed.   [name] levothyroxine (SYNTHROID) 50 MCG tablet Take  1 tablet (50 mcg total) by mouth daily.   . Lidocaine HCl 3 % GEL Reported on 06/22/2015 06/07/2015: Received from: [facility]  . lisinopril (ZESTRIL) 20 MG tablet Take 1 tablet (20 mg total) by mouth daily.   . Magnesium 250 MG TABS Take by mouth. 06/22/2015: Reports taking once a day  . metoprolol succinate (TOPROL-XL) 25 MG 24 hr tablet Take 12.5 mg by mouth.   . ondansetron (ZOFRAN) 8 MG tablet Take 1 tablet (8 mg total) by mouth every 8 (eight) hours as needed for nausea or vomiting.   [name] oxybutynin (DITROPAN-XL) 5 MG 24 hr tablet Take 1 tablet (5 mg total) by mouth 2 (two) times daily.   .  Potassium 99 MG TABS Take by mouth. 06/16/2015: Takes 2 tablets everyday.  [name] spironolactone (ALDACTONE) 25 MG tablet    . SPS 15 GM/60ML suspension    . traMADol (ULTRAM) 50 MG tablet Take 1 tablet (50 mg total) by mouth 2 (two) times daily. One tab po bid prn for pain    No facility-administered encounter medications on file as of 11/03/2019.    Surgical History: Past Surgical History:  Procedure Laterality Date  . ABCESS DRAINAGE  2016   Abdominal abcess due to diverticulitis  . caridoverter defibrillator  11/14/2017   ICD  . CATARACT EXTRACTION W/PHACO Left 10/26/2015   Procedure: CATARACT EXTRACTION PHACO AND INTRAOCULAR LENS PLACEMENT (IOC) LEFT EYE;  Surgeon: Leandrew Koyanagi, MD;  Location: [facility];  Service: Ophthalmology;  Laterality: Left;  . CATARACT EXTRACTION W/PHACO Right 12/07/2015   Procedure: CATARACT EXTRACTION PHACO AND INTRAOCULAR LENS PLACEMENT (IOC);  Surgeon: Leandrew Koyanagi, MD;  Location: [place];  Service: Ophthalmology;  Laterality: Right;  . CHOLECYSTECTOMY    . FOOT SURGERY     Per patient, bilateral foot surgery.  [name] PELVIC FRACTURE SURGERY     Per patient for cancer.    Medical History: Past Medical History:  Diagnosis Date  . Arthritis   . Asthma   . Cancer ([employer])   . Deep vein thrombosis (DVT) ([place]) 2015  . Depression   . GERD (gastroesophageal reflux disease)   . Hypertension   . Weakness of both legs     Family History: Family History  Problem Relation Age of Onset  . Depression Sister   . Heart disease Sister   . Early death Sister   . Depression Sister   . Depression Sister   . Depression Sister   . Depression Sister     Social History   Socioeconomic History  . Marital status: Married    Spouse name: Not on file  . Number of children: Not on file  . Years of education: Not on file  . Highest education level: Not on file  Occupational History  . Not on file  Tobacco Use  . Smoking status: Never  Smoker  . Smokeless tobacco: Never Used  Vaping Use  . Vaping Use: Never used  Substance and Sexual Activity  . Alcohol use: No  . Drug use: No  . Sexual activity: Not Currently  Other Topics Concern  . Not on file  Social History Narrative  . Not on file   Social Determinants of Health   Financial Resource Strain:   . Difficulty of Paying Living Expenses:   Food Insecurity:   . Worried About Charity fundraiser in the Last Year:   . Arboriculturist in the Last Year:   Transportation Needs:   .  Lack of Transportation (Medical):   [name] Lack of Transportation (Non-Medical):   Physical Activity:   . Days of Exercise per Week:   . Minutes of Exercise per Session:   Stress:   . Feeling of Stress :   Social Connections:   . Frequency of Communication with Friends and Family:   . Frequency of Social Gatherings with Friends and Family:   . Attends Religious Services:   . Active Member of Clubs or Organizations:   . Attends Archivist Meetings:   [name] Marital Status:   Intimate Partner Violence:   . Fear of Current or Ex-Partner:   . Emotionally Abused:   [name] Physically Abused:   . Sexually Abused:       Review of Systems  Constitutional: Negative for activity change, chills, fatigue and unexpected weight change.  HENT: Negative for congestion, postnasal drip, rhinorrhea, sneezing and sore throat.   Respiratory: Negative for cough, chest tightness, shortness of breath and wheezing.   Cardiovascular: Negative for chest pain and palpitations.       Blood pressure mildly elevated today.   Gastrointestinal: Positive for abdominal pain. Negative for constipation, diarrhea, nausea and vomiting.       The patient states that she is having intermittent abdominal cramping. Pain is now more in pelvic region. Hurts in abdomen to even walk.   Endocrine: Negative for cold intolerance, heat intolerance, polydipsia and polyuria.       Recent thyroid panel is normal  Genitourinary:  Positive for flank pain. Negative for dysuria, frequency and urgency.       Urine sample from her last visit did not show evidence of infection or hematuria.   Musculoskeletal: Positive for arthralgias, back pain and myalgias. Negative for joint swelling and neck pain.       Severe low back pain interfering with her ROM. She is having trouble participating in her activities of daily living. T  Skin: Negative for rash.  Allergic/Immunologic: Negative for environmental allergies.  Neurological: Negative for dizziness, tremors, numbness and headaches.  Hematological: Negative for adenopathy. Does not bruise/bleed easily.  Psychiatric/Behavioral: Positive for dysphoric mood. Negative for behavioral problems (Depression), sleep disturbance and suicidal ideas. The patient is not nervous/anxious.    Today's Vitals   11/03/19 0829  BP: (!) 152/72  Pulse: 73  Resp: 16  Temp: 97.8 F (36.6 C)  SpO2: 94%  Weight: 263 lb (119.3 kg)  Height: 5' (1.524 m)   Body mass index is 51.36 kg/m.  Physical Exam Vitals and nursing note reviewed.  Constitutional:      General: She is not in acute distress.    Appearance: Normal appearance. She is well-developed. She is not diaphoretic.     Comments: In pain  HENT:     Head: Normocephalic and atraumatic.     Mouth/Throat:     Pharynx: No oropharyngeal exudate.  Eyes:     Pupils: Pupils are equal, round, and reactive to light.  Neck:     Thyroid: No thyromegaly.     Vascular: No JVD.     Trachea: No tracheal deviation.  Cardiovascular:     Rate and Rhythm: Normal rate and regular rhythm.     Heart sounds: Normal heart sounds. No murmur heard.  No friction rub. No gallop.   Pulmonary:     Effort: Pulmonary effort is normal. No respiratory distress.     Breath sounds: Normal breath sounds. No wheezing or rales.  Chest:     Chest wall: No  tenderness.  Abdominal:     General: Bowel sounds are normal.     Palpations: Abdomen is soft.      Tenderness: There is abdominal tenderness.     Comments: Moderate tenderness with palpation of the abdomen. This is most severe in pelvic region. Guarding and grimacing present with palpation of the abdomen .  Musculoskeletal:        General: Normal range of motion.     Cervical back: Normal range of motion and neck supple.     Comments: Moderate to severe lower back pain. This is especially severe with bending and twisting at the waist. improved some since her most recent visit. No visible or palpable abnormalities at this time.   Lymphadenopathy:     Cervical: No cervical adenopathy.  Skin:    General: Skin is warm and dry.  Neurological:     Mental Status: She is alert and oriented to person, place, and time.     Cranial Nerves: No cranial nerve deficit.  Psychiatric:        Behavior: Behavior normal.        Thought Content: Thought content normal.        Judgment: Judgment normal.    Assessment/Plan: 1. Left lower quadrant abdominal pain Reviewed CT of abdomen and pelvis with the patient. There is no evidence of acute diverticulitis or other acute abnormalities noted which would be causing severity of pain. Consider development of adhesions due to multiple abdominal surgeries as well as radiation treatments to area of concern. Recommend she see GI and oncology providers at [facility] for further evaluation and treatment.   2. Hx of malignant neoplasm of uterine body No evidence of metastatic disease present on CT abdomen and pelvis. Consider adhesion development after multiple abdominal surgeries and radiation to area of pain. Recommend she see GI and oncology providers at [facility] for further evaluation and treatment.   3. Lumbago-sciatica due to displacement of lumbar intervertebral disc CT showing increased size of lucency over Li as well as sclerosis of pelvic bones. She does see pain management provider at [facility] and I have recommend she see them again as soon as possible.   4. Moderate major  depression ([place]) Continue with current meds as prescribed   General Counseling: Kaiyana verbalizes understanding of the findings of todays visit and agrees with plan of treatment. I have discussed any further diagnostic evaluation that may be needed or ordered today. We also reviewed her medications today. she has been encouraged to call the office with any questions or concerns that should arise related to todays visit.  This patient was seen by Leretha Pol FNP Collaboration with Dr Lavera Guise as a part of collaborative care agreement  Total time spent: 30 Minutes  Time spent includes review of chart, medications, test results, and follow up plan with the patient.      Dr Lavera Guise Internal medicine

## 2019-11-06 DIAGNOSIS — Z4502 Encounter for adjustment and management of automatic implantable cardiac defibrillator: Secondary | ICD-10-CM | POA: Diagnosis not present

## 2019-11-06 DIAGNOSIS — Z95810 Presence of automatic (implantable) cardiac defibrillator: Secondary | ICD-10-CM | POA: Diagnosis not present

## 2019-12-15 ENCOUNTER — Telehealth: Payer: Self-pay

## 2019-12-15 ENCOUNTER — Other Ambulatory Visit: Payer: Self-pay | Admitting: Nurse Practitioner

## 2019-12-15 DIAGNOSIS — G8929 Other chronic pain: Secondary | ICD-10-CM

## 2019-12-15 MED ORDER — HYDROCODONE-ACETAMINOPHEN 5-325 MG PO TABS: 1.0000 | ORAL_TABLET | Freq: Four times a day (QID) | ORAL | 0 refills | Status: DC | PRN

## 2019-12-15 NOTE — Telephone Encounter (Signed)
Sent short term prescription for hydrocodone/APAP 5/325mg  tablets to [place] for her.

## 2019-12-15 NOTE — Telephone Encounter (Signed)
Pt advised we send med  

## 2019-12-28 DIAGNOSIS — Z79899 Other long term (current) drug therapy: Secondary | ICD-10-CM | POA: Diagnosis not present

## 2019-12-28 DIAGNOSIS — Z86718 Personal history of other venous thrombosis and embolism: Secondary | ICD-10-CM | POA: Diagnosis not present

## 2019-12-28 DIAGNOSIS — Z8542 Personal history of malignant neoplasm of other parts of uterus: Secondary | ICD-10-CM | POA: Diagnosis not present

## 2019-12-28 DIAGNOSIS — M25562 Pain in left knee: Secondary | ICD-10-CM | POA: Diagnosis not present

## 2019-12-28 DIAGNOSIS — M533 Sacrococcygeal disorders, not elsewhere classified: Secondary | ICD-10-CM | POA: Diagnosis not present

## 2019-12-28 DIAGNOSIS — G8929 Other chronic pain: Secondary | ICD-10-CM | POA: Diagnosis not present

## 2019-12-28 DIAGNOSIS — Z90710 Acquired absence of both cervix and uterus: Secondary | ICD-10-CM | POA: Diagnosis not present

## 2019-12-28 DIAGNOSIS — Z923 Personal history of irradiation: Secondary | ICD-10-CM | POA: Diagnosis not present

## 2019-12-28 DIAGNOSIS — Z9221 Personal history of antineoplastic chemotherapy: Secondary | ICD-10-CM | POA: Diagnosis not present

## 2020-01-05 DIAGNOSIS — M533 Sacrococcygeal disorders, not elsewhere classified: Secondary | ICD-10-CM | POA: Diagnosis not present

## 2020-01-07 ENCOUNTER — Other Ambulatory Visit: Payer: Self-pay

## 2020-01-07 MED ORDER — GABAPENTIN 300 MG PO CAPS: ORAL_CAPSULE | ORAL | 0 refills | Status: DC

## 2020-01-07 MED ORDER — GABAPENTIN 300 MG PO CAPS: ORAL_CAPSULE | ORAL | 1 refills | Status: DC

## 2020-01-07 NOTE — Telephone Encounter (Signed)
Pt called her mailed order phar is not able to send pres now so send 10 days supply to local phar

## 2020-01-11 ENCOUNTER — Other Ambulatory Visit: Payer: Self-pay

## 2020-01-11 MED ORDER — FLUTICASONE-SALMETEROL 250-50 MCG/DOSE IN AEPB: 1.0000 | INHALATION_SPRAY | Freq: Two times a day (BID) | RESPIRATORY_TRACT | 0 refills | Status: DC

## 2020-01-13 DIAGNOSIS — I428 Other cardiomyopathies: Secondary | ICD-10-CM | POA: Diagnosis not present

## 2020-01-13 DIAGNOSIS — Z4502 Encounter for adjustment and management of automatic implantable cardiac defibrillator: Secondary | ICD-10-CM | POA: Diagnosis not present

## 2020-01-13 DIAGNOSIS — I5020 Unspecified systolic (congestive) heart failure: Secondary | ICD-10-CM | POA: Diagnosis not present

## 2020-01-22 DIAGNOSIS — I10 Essential (primary) hypertension: Secondary | ICD-10-CM | POA: Diagnosis not present

## 2020-01-22 DIAGNOSIS — I420 Dilated cardiomyopathy: Secondary | ICD-10-CM | POA: Diagnosis not present

## 2020-02-01 ENCOUNTER — Other Ambulatory Visit: Payer: Self-pay

## 2020-02-01 ENCOUNTER — Encounter: Payer: Self-pay | Admitting: Nurse Practitioner

## 2020-02-01 ENCOUNTER — Ambulatory Visit (INDEPENDENT_AMBULATORY_CARE_PROVIDER_SITE_OTHER): Payer: Medicare HMO | Admitting: Hospice and Palliative Medicine

## 2020-02-01 DIAGNOSIS — K529 Noninfective gastroenteritis and colitis, unspecified: Secondary | ICD-10-CM

## 2020-02-01 DIAGNOSIS — M5441 Lumbago with sciatica, right side: Secondary | ICD-10-CM | POA: Diagnosis not present

## 2020-02-01 DIAGNOSIS — G8929 Other chronic pain: Secondary | ICD-10-CM | POA: Diagnosis not present

## 2020-02-01 DIAGNOSIS — M5442 Lumbago with sciatica, left side: Secondary | ICD-10-CM

## 2020-02-01 DIAGNOSIS — R0600 Dyspnea, unspecified: Secondary | ICD-10-CM | POA: Diagnosis not present

## 2020-02-01 DIAGNOSIS — I5022 Chronic systolic (congestive) heart failure: Secondary | ICD-10-CM | POA: Diagnosis not present

## 2020-02-01 DIAGNOSIS — I10 Essential (primary) hypertension: Secondary | ICD-10-CM | POA: Diagnosis not present

## 2020-02-01 DIAGNOSIS — R0609 Other forms of dyspnea: Secondary | ICD-10-CM

## 2020-02-01 MED ORDER — CIPROFLOXACIN HCL 500 MG PO TABS: 500.0000 mg | ORAL_TABLET | Freq: Two times a day (BID) | ORAL | 0 refills | Status: DC

## 2020-02-01 NOTE — Progress Notes (Signed)
[facility] [place], [place] [address]  Internal MEDICINE  Office Visit Note  Patient Name: Diana Bernard  [id number]  [id number]  Date of Service: 02/01/2020  Chief Complaint  Patient presents with  . Follow-up  . Depression  . Gastroesophageal Reflux  . Hypertension  . Quality Metric Gaps    flu, tetnaus,hep C  . Abdominal Pain    making her nauseous    HPI Patient is here for routine follow-up She continues to complain of left sided abdominal pain, intense nausea as well as diarrhea She says the pain is more of a tenderness, she has had multiple drains placed in that same area before due to an infection caused by her diverticulitis She has been avoiding foods that she knows have been a trigger for her diverticulitis in the past but has not noticed a big difference She says the nausea is the most bothersome but also having several episodes of diarrhea a day as well She has not been in contact with her GI doctor from [facility]--says she will need a referral  She recently had an appointment with her cardiologist to follow-up on her HFrEF, was started on spironolactone for increased shortness of breath with exertion that they feel is related to her heart failure, they have her scheduled to come back tomorrow morning to have labs drawn to check kidney function after starting medication -CRT-D placement 2019 -Her last echo 09/2019 EF 45%  She says her increased shortness of breath has been ongoing for a few months, does not feel the need to take her lasix more than once or twice per week, she has had a 16 pound weight gain in 1 year She contributes this to getting multiple steroid injections from her orthopaedic doctor for neck and shoulder pain--ortho has since stopped her injections due to her significant weight gain  She is still being followed by pain management for chronic pain--treated with gabapentin as well as Cymbalta, ordered to start PT  Current  Medication: Outpatient Encounter Medications as of 02/01/2020  Medication Sig Note  . albuterol (VENTOLIN HFA) 108 (90 Base) MCG/ACT inhaler Inhale 2 puffs into the lungs every 6 (six) hours as needed for wheezing or shortness of breath.   [name] alendronate (FOSAMAX) 70 MG tablet Take 1 tablet (70 mg total) by mouth every 7 (seven) days. Take with a full glass of water on an empty stomach.   [name] amLODipine (NORVASC) 10 MG tablet Take 1 tab po daily   . cholecalciferol (VITAMIN D) 1000 units tablet Take 1,000 Units by mouth daily.   . cilostazol (PLETAL) 50 MG tablet Take 1 tablet (50 mg total) by mouth 2 (two) times daily.   [name] dicyclomine (BENTYL) 10 MG capsule Take 1 capsule po TID prn intestinal cramping/diarrhea   . diphenoxylate-atropine (LOMOTIL) 2.5-0.025 MG tablet Take 1 tablet by mouth 4 (four) times daily as needed for diarrhea or loose stools.   . docusate sodium (STOOL SOFTENER) 100 MG capsule Take 200 mg by mouth daily.  06/07/2015: Received from: [facility]  . DULoxetine (CYMBALTA) 30 MG capsule Take 1 capsule po bid   . esomeprazole (NEXIUM) 20 MG capsule Take 20 mg by mouth daily at 12 noon.   [name] ESOMEPRAZOLE MAGNESIUM PO Take 40 mg by mouth.   . Fluticasone-Salmeterol (ADVAIR) 250-50 MCG/DOSE AEPB Inhale 1 puff into the lungs 2 (two) times daily.   . furosemide (LASIX) 20 MG tablet Take 1 tablet (20 mg total) by mouth  daily.   . gabapentin (NEURONTIN) 300 MG capsule Take 2 cap twice a daily for neuropathy   . hydrochlorothiazide (HYDRODIURIL) 25 MG tablet Take 25 mg by mouth daily.   [name] HYDROcodone-acetaminophen (NORCO/VICODIN) 5-325 MG tablet Take 1 tablet by mouth every 6 (six) hours as needed for moderate pain.   [name] ibuprofen (ADVIL,MOTRIN) 800 MG tablet Take 800 mg by mouth. Reported on 06/22/2015 06/16/2015: Can take up to 3 tablets per day as needed.   [name] ipratropium-albuterol (DUONEB) 0.5-2.5 (3) MG/3ML SOLN Take 3 mLs by nebulization every 4 (four) hours as needed.   [name]  levothyroxine (SYNTHROID) 50 MCG tablet Take 1 tablet (50 mcg total) by mouth daily.   . Lidocaine HCl 3 % GEL Reported on 06/22/2015 06/07/2015: Received from: [facility]  . lisinopril (ZESTRIL) 20 MG tablet Take 1 tablet (20 mg total) by mouth daily.   . Magnesium 250 MG TABS Take by mouth. 06/22/2015: Reports taking once a day  . metoprolol succinate (TOPROL-XL) 25 MG 24 hr tablet Take 12.5 mg by mouth.   . ondansetron (ZOFRAN) 8 MG tablet Take 1 tablet (8 mg total) by mouth every 8 (eight) hours as needed for nausea or vomiting.   [name] oxybutynin (DITROPAN-XL) 5 MG 24 hr tablet Take 1 tablet (5 mg total) by mouth 2 (two) times daily.   . Potassium 99 MG TABS Take by mouth. 06/16/2015: Takes 2 tablets everyday.  [name] spironolactone (ALDACTONE) 25 MG tablet    . SPS 15 GM/60ML suspension    . traMADol (ULTRAM) 50 MG tablet Take 1 tablet (50 mg total) by mouth 2 (two) times daily. One tab po bid prn for pain   . ciprofloxacin (CIPRO) 500 MG tablet Take 1 tablet (500 mg total) by mouth 2 (two) times daily.    No facility-administered encounter medications on file as of 02/01/2020.    Surgical History: Past Surgical History:  Procedure Laterality Date  . ABCESS DRAINAGE  2016   Abdominal abcess due to diverticulitis  . caridoverter defibrillator  11/14/2017   ICD  . CATARACT EXTRACTION W/PHACO Left 10/26/2015   Procedure: CATARACT EXTRACTION PHACO AND INTRAOCULAR LENS PLACEMENT (IOC) LEFT EYE;  Surgeon: Leandrew Koyanagi, MD;  Location: [facility];  Service: Ophthalmology;  Laterality: Left;  . CATARACT EXTRACTION W/PHACO Right 12/07/2015   Procedure: CATARACT EXTRACTION PHACO AND INTRAOCULAR LENS PLACEMENT (IOC);  Surgeon: Leandrew Koyanagi, MD;  Location: [facility];  Service: Ophthalmology;  Laterality: Right;  . CHOLECYSTECTOMY    . FOOT SURGERY     Per patient, bilateral foot surgery.  [name] PELVIC FRACTURE SURGERY     Per patient for cancer.    Medical History: Past  Medical History:  Diagnosis Date  . Arthritis   . Asthma   . Cancer ([place])   . Deep vein thrombosis (DVT) ([place]) 2015  . Depression   . GERD (gastroesophageal reflux disease)   . Hypertension   . Weakness of both legs     Family History: Family History  Problem Relation Age of Onset  . Depression Sister   . Heart disease Sister   . Early death Sister   . Depression Sister   . Depression Sister   . Depression Sister   . Depression Sister     Social History   Socioeconomic History  . Marital status: Married    Spouse name: Not on file  . Number of children: Not on file  . Years of education: Not on file  .  Highest education level: Not on file  Occupational History  . Not on file  Tobacco Use  . Smoking status: Never Smoker  . Smokeless tobacco: Never Used  Vaping Use  . Vaping Use: Never used  Substance and Sexual Activity  . Alcohol use: No  . Drug use: No  . Sexual activity: Not Currently  Other Topics Concern  . Not on file  Social History Narrative  . Not on file   Social Determinants of Health   Financial Resource Strain:   . Difficulty of Paying Living Expenses: Not on file  Food Insecurity:   . Worried About Charity fundraiser in the Last Year: Not on file  . Ran Out of Food in the Last Year: Not on file  Transportation Needs:   . Lack of Transportation (Medical): Not on file  . Lack of Transportation (Non-Medical): Not on file  Physical Activity:   . Days of Exercise per Week: Not on file  . Minutes of Exercise per Session: Not on file  Stress:   . Feeling of Stress : Not on file  Social Connections:   . Frequency of Communication with Friends and Family: Not on file  . Frequency of Social Gatherings with Friends and Family: Not on file  . Attends Religious Services: Not on file  . Active Member of Clubs or Organizations: Not on file  . Attends Archivist Meetings: Not on file  . Marital Status: Not on file  Intimate Partner  Violence:   . Fear of Current or Ex-Partner: Not on file  . Emotionally Abused: Not on file  . Physically Abused: Not on file  . Sexually Abused: Not on file      Review of Systems  Constitutional: Negative for chills, diaphoresis and fatigue.  HENT: Negative for ear pain, postnasal drip and sinus pressure.   Eyes: Negative for photophobia, discharge, redness, itching and visual disturbance.  Respiratory: Positive for shortness of breath. Negative for cough and wheezing.   Cardiovascular: Negative for chest pain, palpitations and leg swelling.  Gastrointestinal: Positive for abdominal pain, diarrhea and nausea. Negative for constipation and vomiting.  Genitourinary: Negative for dysuria and flank pain.  Musculoskeletal: Positive for arthralgias, back pain and neck pain. Negative for gait problem.  Skin: Negative for color change.  Allergic/Immunologic: Negative for environmental allergies and food allergies.  Neurological: Positive for weakness. Negative for dizziness and headaches.  Hematological: Does not bruise/bleed easily.  Psychiatric/Behavioral: Negative for agitation, behavioral problems (depression) and hallucinations.    Vital Signs: BP 139/89   Pulse 75   Temp (!) 97.5 F (36.4 C)   Resp 16   Ht 5' (1.524 m)   Wt 266 lb 3.2 oz (120.7 kg)   SpO2 95%   BMI 51.99 kg/m    Physical Exam Vitals reviewed.  Constitutional:      Appearance: She is obese.  Cardiovascular:     Rate and Rhythm: Normal rate and regular rhythm.     Heart sounds: Normal heart sounds.  Pulmonary:     Effort: Pulmonary effort is normal.     Breath sounds: Normal breath sounds.  Abdominal:     General: Abdomen is flat. Bowel sounds are normal.     Palpations: Abdomen is soft.     Tenderness: There is abdominal tenderness in the left lower quadrant.  Skin:    General: Skin is warm.  Neurological:     General: No focal deficit present.     Mental Status:  She is alert and oriented to  person, place, and time.  Psychiatric:        Mood and Affect: Mood normal.        Behavior: Behavior normal.    Assessment/Plan: 1. Gastroenteritis, acute Acute gastroenteritis vs diverticulitis flare, recent abdominal US no evidence of diverticulitis--will treat with Cipro for symptoms of enteritis Follow-up with GI for ongoing symptoms due to history of diverticulitis - ciprofloxacin (CIPRO) 500 MG tablet; Take 1 tablet (500 mg total) by mouth 2 (two) times daily.  Dispense: 10 tablet; Refill: 0 - Ambulatory referral to Gastroenterology  2. Essential hypertension BP and HR stable today on current therapy, will continue with routine monitoring  3. Chronic bilateral low back pain with bilateral sciatica Continued complaints of shoulder pain, at this time continue with pain specialist plan of care, may benefit from ortho referral  4. Chronic systolic heart failure ([place]) Will need close monitoring of electrolytes as well as kidney function since being started on spironolactone, has noticed a slight improvement in her symptoms since starting spironolactone Continue to be closely followed by cardiology Last echo 6/21 EF 45% GFR 52 09/2019, may need further work-up if warranted by lab results  5. Dyspnea on exertion May need further pulmonary work-up if DOE persists with addition of spironolactone Consider PFT, CXR, possible CTA   General Counseling: Myda verbalizes understanding of the findings of todays visit and agrees with plan of treatment. I have discussed any further diagnostic evaluation that may be needed or ordered today. We also reviewed her medications today. she has been encouraged to call the office with any questions or concerns that should arise related to todays visit.    Orders Placed This Encounter  Procedures  . Ambulatory referral to Gastroenterology    Meds ordered this encounter  Medications  . ciprofloxacin (CIPRO) 500 MG tablet    Sig: Take 1 tablet  (500 mg total) by mouth 2 (two) times daily.    Dispense:  10 tablet    Refill:  0    Time spent: 30 Minutes Time spent includes review of chart, medications, test results and follow-up plan with the patient.  This patient was seen by Theodoro Grist AGNP-C in Collaboration with Dr Lavera Guise as a part of collaborative care agreement     Tanna Furry. Jojo Pehl AGNP-C Internal medicine

## 2020-02-02 DIAGNOSIS — I420 Dilated cardiomyopathy: Secondary | ICD-10-CM | POA: Diagnosis not present

## 2020-02-05 DIAGNOSIS — Z4502 Encounter for adjustment and management of automatic implantable cardiac defibrillator: Secondary | ICD-10-CM | POA: Diagnosis not present

## 2020-02-26 DIAGNOSIS — M2550 Pain in unspecified joint: Secondary | ICD-10-CM | POA: Diagnosis not present

## 2020-02-26 DIAGNOSIS — M1990 Unspecified osteoarthritis, unspecified site: Secondary | ICD-10-CM | POA: Diagnosis not present

## 2020-02-26 DIAGNOSIS — Z4502 Encounter for adjustment and management of automatic implantable cardiac defibrillator: Secondary | ICD-10-CM | POA: Diagnosis not present

## 2020-02-26 DIAGNOSIS — I110 Hypertensive heart disease with heart failure: Secondary | ICD-10-CM | POA: Diagnosis not present

## 2020-02-26 DIAGNOSIS — Z6841 Body Mass Index (BMI) 40.0 and over, adult: Secondary | ICD-10-CM | POA: Diagnosis not present

## 2020-02-26 DIAGNOSIS — I5022 Chronic systolic (congestive) heart failure: Secondary | ICD-10-CM | POA: Diagnosis not present

## 2020-02-26 DIAGNOSIS — M549 Dorsalgia, unspecified: Secondary | ICD-10-CM | POA: Diagnosis not present

## 2020-02-26 DIAGNOSIS — I493 Ventricular premature depolarization: Secondary | ICD-10-CM | POA: Diagnosis not present

## 2020-03-07 ENCOUNTER — Other Ambulatory Visit: Payer: Self-pay

## 2020-03-07 DIAGNOSIS — E039 Hypothyroidism, unspecified: Secondary | ICD-10-CM

## 2020-03-07 MED ORDER — LEVOTHYROXINE SODIUM 50 MCG PO TABS: 50.0000 ug | ORAL_TABLET | Freq: Every day | ORAL | 1 refills | Status: DC

## 2020-03-08 ENCOUNTER — Other Ambulatory Visit: Payer: Self-pay

## 2020-03-08 MED ORDER — FLUTICASONE-SALMETEROL 250-50 MCG/DOSE IN AEPB: 1.0000 | INHALATION_SPRAY | Freq: Two times a day (BID) | RESPIRATORY_TRACT | 0 refills | Status: DC

## 2020-03-09 ENCOUNTER — Other Ambulatory Visit: Payer: Self-pay

## 2020-03-09 ENCOUNTER — Ambulatory Visit (INDEPENDENT_AMBULATORY_CARE_PROVIDER_SITE_OTHER): Payer: Medicare HMO | Admitting: Nurse Practitioner

## 2020-03-09 ENCOUNTER — Other Ambulatory Visit: Payer: Self-pay | Admitting: Nurse Practitioner

## 2020-03-09 ENCOUNTER — Ambulatory Visit
Admission: RE | Admit: 2020-03-09 | Discharge: 2020-03-09 | Disposition: A | Discharge status: Home / Self Care | Payer: Medicare HMO | Source: Ambulatory Visit | Attending: Nurse Practitioner | Admitting: Nurse Practitioner

## 2020-03-09 ENCOUNTER — Ambulatory Visit
Admission: RE | Admit: 2020-03-09 | Discharge: 2020-03-09 | Disposition: A | Discharge status: Home / Self Care | Payer: Medicare HMO | Attending: Nurse Practitioner | Admitting: Nurse Practitioner

## 2020-03-09 ENCOUNTER — Encounter: Payer: Self-pay | Admitting: Nurse Practitioner

## 2020-03-09 VITALS — BP 134/80 | HR 73 | Temp 98.4°F | Resp 16 | Ht 60.0 in | Wt 274.6 lb

## 2020-03-09 DIAGNOSIS — M5441 Lumbago with sciatica, right side: Secondary | ICD-10-CM

## 2020-03-09 DIAGNOSIS — R0600 Dyspnea, unspecified: Secondary | ICD-10-CM

## 2020-03-09 DIAGNOSIS — J069 Acute upper respiratory infection, unspecified: Secondary | ICD-10-CM

## 2020-03-09 DIAGNOSIS — R0609 Other forms of dyspnea: Secondary | ICD-10-CM

## 2020-03-09 DIAGNOSIS — I5022 Chronic systolic (congestive) heart failure: Secondary | ICD-10-CM | POA: Diagnosis not present

## 2020-03-09 DIAGNOSIS — I10 Essential (primary) hypertension: Secondary | ICD-10-CM

## 2020-03-09 DIAGNOSIS — G8929 Other chronic pain: Secondary | ICD-10-CM

## 2020-03-09 DIAGNOSIS — M5442 Lumbago with sciatica, left side: Secondary | ICD-10-CM

## 2020-03-09 DIAGNOSIS — R0602 Shortness of breath: Secondary | ICD-10-CM | POA: Diagnosis not present

## 2020-03-09 MED ORDER — SULFAMETHOXAZOLE-TRIMETHOPRIM 800-160 MG PO TABS: 1.0000 | ORAL_TABLET | Freq: Two times a day (BID) | ORAL | 0 refills | Status: DC

## 2020-03-09 MED ORDER — METHYLPREDNISOLONE 4 MG PO TBPK: ORAL_TABLET | ORAL | 0 refills | Status: DC

## 2020-03-09 MED ORDER — IBUPROFEN 800 MG PO TABS: 800.0000 mg | ORAL_TABLET | Freq: Two times a day (BID) | ORAL | 1 refills | Status: DC | PRN

## 2020-03-09 NOTE — Progress Notes (Signed)
[facility] [place], [place] [address]  Internal MEDICINE  Office Visit Note  Patient Name: Diana Bernard  [id number]  [id number]  Date of Service: 04/03/2020   Pt is here for a sick visit.  Chief Complaint  Patient presents with  . Shortness of Breath    issues for 1 1/2 months now.  been using mucinex but doesn't see that it helps  . Back Pain    long history of pain  . controlled substance policy    acknowledged     The patient is here for acute visit. She is having increased shortness of breath, wheezing, and tightness across her chest. She is very fatigued. States that she is getting winded just going from the car into her home. She does see cardiology for dilated cardiomyopathy. She has ventricular pacemaker. In 12/2019, she was started on spironolactone to help with heart failure. Her blood pressure is good today. She has noted cough which is deep and non productive. She denies fever, chills, nausea, or vomiting.        Current Medication:  Outpatient Encounter Medications as of 03/09/2020  Medication Sig Note  . albuterol (VENTOLIN HFA) 108 (90 Base) MCG/ACT inhaler Inhale 2 puffs into the lungs every 6 (six) hours as needed for wheezing or shortness of breath.   [name] alendronate (FOSAMAX) 70 MG tablet Take 1 tablet (70 mg total) by mouth every 7 (seven) days. Take with a full glass of water on an empty stomach.   [name] amLODipine (NORVASC) 10 MG tablet Take 1 tab po daily   . cholecalciferol (VITAMIN D) 1000 units tablet Take 1,000 Units by mouth daily.   . cilostazol (PLETAL) 50 MG tablet Take 1 tablet (50 mg total) by mouth 2 (two) times daily.   . ciprofloxacin (CIPRO) 500 MG tablet Take 1 tablet (500 mg total) by mouth 2 (two) times daily.   [name] dicyclomine (BENTYL) 10 MG capsule Take 1 capsule po TID prn intestinal cramping/diarrhea   . diphenoxylate-atropine (LOMOTIL) 2.5-0.025 MG tablet Take 1 tablet by mouth 4 (four) times daily as  needed for diarrhea or loose stools.   . docusate sodium (STOOL SOFTENER) 100 MG capsule Take 200 mg by mouth daily.  06/07/2015: Received from: [facility]  . DULoxetine (CYMBALTA) 30 MG capsule Take 1 capsule po bid   . esomeprazole (NEXIUM) 20 MG capsule Take 20 mg by mouth daily at 12 noon.   [name] ESOMEPRAZOLE MAGNESIUM PO Take 40 mg by mouth.   . Fluticasone-Salmeterol (ADVAIR) 250-50 MCG/DOSE AEPB Inhale 1 puff into the lungs 2 (two) times daily.   . furosemide (LASIX) 20 MG tablet Take 1 tablet (20 mg total) by mouth daily.   [name] gabapentin (NEURONTIN) 300 MG capsule Take 2 cap twice a daily for neuropathy   . hydrochlorothiazide (HYDRODIURIL) 25 MG tablet Take 25 mg by mouth daily.   [name] HYDROcodone-acetaminophen (NORCO/VICODIN) 5-325 MG tablet Take 1 tablet by mouth every 6 (six) hours as needed for moderate pain.   [name] ibuprofen (ADVIL) 800 MG tablet Take 1 tablet (800 mg total) by mouth 2 (two) times daily as needed. Reported on 06/22/2015   . ipratropium-albuterol (DUONEB) 0.5-2.5 (3) MG/3ML SOLN Take 3 mLs by nebulization every 4 (four) hours as needed.   [name] levothyroxine (SYNTHROID) 50 MCG tablet Take 1 tablet (50 mcg total) by mouth daily.   . Lidocaine HCl 3 % GEL Reported on 06/22/2015 06/07/2015: Received from: [facility]  . lisinopril (  ZESTRIL) 20 MG tablet Take 1 tablet (20 mg total) by mouth daily.   . Magnesium 250 MG TABS Take by mouth. 06/22/2015: Reports taking once a day  . metoprolol succinate (TOPROL-XL) 25 MG 24 hr tablet Take 12.5 mg by mouth.   . ondansetron (ZOFRAN) 8 MG tablet Take 1 tablet (8 mg total) by mouth every 8 (eight) hours as needed for nausea or vomiting.   [name] oxybutynin (DITROPAN-XL) 5 MG 24 hr tablet Take 1 tablet (5 mg total) by mouth 2 (two) times daily.   . Potassium 99 MG TABS Take by mouth. 06/16/2015: Takes 2 tablets everyday.  [name] spironolactone (ALDACTONE) 25 MG tablet    . SPS 15 GM/60ML suspension    . traMADol (ULTRAM) 50 MG tablet Take 1 tablet (50  mg total) by mouth 2 (two) times daily. One tab po bid prn for pain   . [DISCONTINUED] ibuprofen (ADVIL,MOTRIN) 800 MG tablet Take 800 mg by mouth. Reported on 06/22/2015 06/16/2015: Can take up to 3 tablets per day as needed.   . methylPREDNISolone (MEDROL) 4 MG TBPK tablet Take by mouth as directed for 6 days   . sulfamethoxazole-trimethoprim (BACTRIM DS) 800-160 MG tablet Take 1 tablet by mouth 2 (two) times daily.    No facility-administered encounter medications on file as of 03/09/2020.      Medical History: Past Medical History:  Diagnosis Date  . Arthritis   . Asthma   . Cancer ([place])   . Deep vein thrombosis (DVT) ([place]) 2015  . Depression   . GERD (gastroesophageal reflux disease)   . Hypertension   . Weakness of both legs      Today's Vitals   03/09/20 0943  BP: 134/80  Pulse: 73  Resp: 16  Temp: 98.4 F (36.9 C)  SpO2: 95%  Weight: 274 lb 9.6 oz (124.6 kg)  Height: 5' (1.524 m)   Body mass index is 53.63 kg/m.  Review of Systems  Constitutional: Positive for activity change and fatigue. Negative for chills and unexpected weight change.  HENT: Positive for postnasal drip. Negative for congestion, rhinorrhea, sneezing and sore throat.   Eyes: Negative for redness.  Respiratory: Positive for cough, shortness of breath and wheezing. Negative for chest tightness.   Cardiovascular: Positive for palpitations and leg swelling. Negative for chest pain.  Gastrointestinal: Negative for abdominal pain, constipation, diarrhea, nausea and vomiting.  Musculoskeletal: Positive for arthralgias and myalgias. Negative for back pain, joint swelling and neck pain.  Skin: Negative for rash.  Allergic/Immunologic: Positive for environmental allergies.  Neurological: Positive for headaches. Negative for dizziness, tremors and numbness.  Hematological: Negative for adenopathy. Does not bruise/bleed easily.  Psychiatric/Behavioral: Negative for behavioral problems (Depression), sleep  disturbance and suicidal ideas. The patient is nervous/anxious.     Physical Exam Vitals and nursing note reviewed.  Constitutional:      General: She is not in acute distress.    Appearance: Normal appearance. She is well-developed. She is obese. She is ill-appearing. She is not diaphoretic.  HENT:     Head: Normocephalic and atraumatic.     Nose: Nose normal.     Mouth/Throat:     Pharynx: No oropharyngeal exudate.  Eyes:     Pupils: Pupils are equal, round, and reactive to light.  Neck:     Thyroid: No thyromegaly.     Vascular: No carotid bruit or JVD.     Trachea: No tracheal deviation.  Cardiovascular:     Rate and Rhythm: Normal rate. Rhythm  irregular.     Heart sounds: Murmur heard.  No friction rub. No gallop.   Pulmonary:     Effort: Pulmonary effort is normal. No respiratory distress.     Breath sounds: Wheezing present. No rales.  Chest:     Chest wall: No tenderness.  Abdominal:     Palpations: Abdomen is soft.  Musculoskeletal:        General: Normal range of motion.     Cervical back: Normal range of motion and neck supple.  Lymphadenopathy:     Cervical: No cervical adenopathy.  Skin:    General: Skin is warm and dry.  Neurological:     Mental Status: She is alert and oriented to person, place, and time.     Cranial Nerves: No cranial nerve deficit.  Psychiatric:        Attention and Perception: Attention and perception normal.        Mood and Affect: Mood is anxious.        Speech: Speech normal.        Behavior: Behavior normal. Behavior is cooperative.        Thought Content: Thought content normal.        Cognition and Memory: Cognition normal.        Judgment: Judgment normal.    Assessment/Plan: 1. Acute upper respiratory infection  Start bactrim DS twice daily for next 10 days. Rest. Will get chest x-ray for further evaluation.  - DG Chest 2 View; Future - sulfamethoxazole-trimethoprim (BACTRIM DS) 800-160 MG tablet; Take 1 tablet by mouth  2 (two) times daily.  Dispense: 20 tablet; Refill: 0  2. Dyspnea on exertion Medrol dose pack. Take as directed for 6 days to help shortness of breath. Will get chest x-ray for further evaluation.  - DG Chest 2 View; Future - methylPREDNISolone (MEDROL) 4 MG TBPK tablet; Take by mouth as directed for 6 days  Dispense: 21 tablet; Refill: 0  3. Essential hypertension Stable. Continue bp medication as prescribe   4. Chronic systolic heart failure ([place]) Recommended she contact her cardiologist as soon as possible to discuss new and worsening symptoms.   5. Chronic bilateral low back pain with bilateral sciatica May take ibuprofen twice daily as needed.  - ibuprofen (ADVIL) 800 MG tablet; Take 1 tablet (800 mg total) by mouth 2 (two) times daily as needed. Reported on 06/22/2015  Dispense: 60 tablet; Refill: 1  General Counseling: Diana Bernard verbalizes understanding of the findings of todays visit and agrees with plan of treatment. I have discussed any further diagnostic evaluation that may be needed or ordered today. We also reviewed her medications today. she has been encouraged to call the office with any questions or concerns that should arise related to todays visit.    Counseling:  Cardiac risk factor modification:  1. Control blood pressure. 2. Exercise as prescribed. 3. Follow low sodium, low fat diet. and low fat and low cholestrol diet. 4. Take ASA 81mg  once a day. 5. Restricted calories diet to lose weight.  This patient was seen by Leretha Pol FNP Collaboration with Dr Lavera Guise as a part of collaborative care agreement  Orders Placed This Encounter  Procedures  . DG Chest 2 View    Meds ordered this encounter  Medications  . methylPREDNISolone (MEDROL) 4 MG TBPK tablet    Sig: Take by mouth as directed for 6 days    Dispense:  21 tablet    Refill:  0    Order Specific  Question:   Supervising Provider    Answer:   Lavera Guise [[id number]]  .  sulfamethoxazole-trimethoprim (BACTRIM DS) 800-160 MG tablet    Sig: Take 1 tablet by mouth 2 (two) times daily.    Dispense:  20 tablet    Refill:  0    Order Specific Question:   Supervising Provider    Answer:   Lavera Guise [[id number]]  . ibuprofen (ADVIL) 800 MG tablet    Sig: Take 1 tablet (800 mg total) by mouth 2 (two) times daily as needed. Reported on 06/22/2015    Dispense:  60 tablet    Refill:  1    Order Specific Question:   Supervising Provider    Answer:   Lavera Guise [[id number]]    Time spent: 45 Minutes

## 2020-03-09 NOTE — Progress Notes (Signed)
Please let the patient know that her chest x-ray is normal. Also, please encourage her to contact her cardiologist. Thanks.

## 2020-03-10 LAB — BASIC METABOLIC PANEL
BUN/Creatinine Ratio: 15 (ref 12–28)
BUN: 21 mg/dL (ref 8–27)
CO2: 31 mmol/L — ABNORMAL HIGH (ref 20–29)
Calcium: 10.6 mg/dL — ABNORMAL HIGH (ref 8.7–10.3)
Chloride: 99 mmol/L (ref 96–106)
Creatinine, Ser: 1.44 mg/dL — ABNORMAL HIGH (ref 0.57–1.00)
GFR calc Af Amer: 42 mL/min/{1.73_m2} — ABNORMAL LOW (ref >59)
GFR calc non Af Amer: 36 mL/min/{1.73_m2} — ABNORMAL LOW (ref >59)
Glucose: 103 mg/dL — ABNORMAL HIGH (ref 65–99)
Potassium: 4.6 mmol/L (ref 3.5–5.2)
Sodium: 143 mmol/L (ref 134–144)

## 2020-03-10 LAB — BRAIN NATRIURETIC PEPTIDE: BNP: 28.4 pg/mL (ref 0.0–100.0)

## 2020-03-11 ENCOUNTER — Telehealth: Payer: Self-pay

## 2020-03-11 NOTE — Telephone Encounter (Signed)
I gave message to nimisha already

## 2020-03-11 NOTE — Telephone Encounter (Signed)
Pt notified for xray result  °

## 2020-03-11 NOTE — Progress Notes (Signed)
Pt.notified

## 2020-03-11 NOTE — Telephone Encounter (Signed)
-----   Message from Ronnell Freshwater, NP sent at 03/09/2020  4:40 PM EST ----- Please let the patient know that her chest x-ray is normal. Also, please encourage her to contact her cardiologist. Thanks.

## 2020-03-16 ENCOUNTER — Ambulatory Visit: Payer: Medicare HMO | Admitting: Hospice and Palliative Medicine

## 2020-03-18 DIAGNOSIS — Z4502 Encounter for adjustment and management of automatic implantable cardiac defibrillator: Secondary | ICD-10-CM | POA: Diagnosis not present

## 2020-03-23 ENCOUNTER — Ambulatory Visit: Payer: Medicare HMO

## 2020-03-28 ENCOUNTER — Ambulatory Visit: Payer: Medicare HMO | Admitting: Hospice and Palliative Medicine

## 2020-03-28 DIAGNOSIS — M461 Sacroiliitis, not elsewhere classified: Secondary | ICD-10-CM | POA: Diagnosis not present

## 2020-03-28 DIAGNOSIS — G894 Chronic pain syndrome: Secondary | ICD-10-CM | POA: Diagnosis not present

## 2020-03-30 ENCOUNTER — Ambulatory Visit (INDEPENDENT_AMBULATORY_CARE_PROVIDER_SITE_OTHER): Payer: Medicare HMO

## 2020-03-30 ENCOUNTER — Other Ambulatory Visit: Payer: Self-pay

## 2020-03-30 DIAGNOSIS — G4733 Obstructive sleep apnea (adult) (pediatric): Secondary | ICD-10-CM | POA: Diagnosis not present

## 2020-03-30 NOTE — Progress Notes (Signed)
95 percentile pressure 10   95th percentile leak 23.7   apnea index 0.6 /hr  apnea-hypopnea index  0.8 /hr   total days used  >4 hr 85 days  total days used <4 hr 2 days  Total compliance 94 percent  She is doing great on cpap, but having really hard time with being short of breath continuously, she has dr appt on Monday advised if gets worse to call to see if can get in earlier or go to ER

## 2020-04-03 DIAGNOSIS — R0600 Dyspnea, unspecified: Secondary | ICD-10-CM | POA: Insufficient documentation

## 2020-04-03 DIAGNOSIS — R0609 Other forms of dyspnea: Secondary | ICD-10-CM | POA: Insufficient documentation

## 2020-04-04 ENCOUNTER — Other Ambulatory Visit: Payer: Self-pay

## 2020-04-04 ENCOUNTER — Ambulatory Visit: Payer: Medicare HMO | Admitting: Internal Medicine

## 2020-04-04 ENCOUNTER — Encounter: Payer: Self-pay | Admitting: Internal Medicine

## 2020-04-04 DIAGNOSIS — I700 Atherosclerosis of aorta: Secondary | ICD-10-CM | POA: Diagnosis not present

## 2020-04-04 DIAGNOSIS — R0600 Dyspnea, unspecified: Secondary | ICD-10-CM

## 2020-04-04 DIAGNOSIS — Z9989 Dependence on other enabling machines and devices: Secondary | ICD-10-CM

## 2020-04-04 DIAGNOSIS — I2721 Secondary pulmonary arterial hypertension: Secondary | ICD-10-CM

## 2020-04-04 DIAGNOSIS — R0609 Other forms of dyspnea: Secondary | ICD-10-CM

## 2020-04-04 DIAGNOSIS — G4733 Obstructive sleep apnea (adult) (pediatric): Secondary | ICD-10-CM | POA: Diagnosis not present

## 2020-04-04 NOTE — Progress Notes (Unsigned)
[facility] [place], [place] [address]  Internal MEDICINE  Office Visit Note  Patient Name: Diana Bernard  [id number]  [id number]  Date of Service: 04/06/2020  Chief Complaint  Patient presents with  . Follow-up    6 month  . Shortness of Breath    hard to walk without giving out of breathe, x 2 months    HPI Pt is here for pulmonary follow up. She continues to be short of breath, this happens with minimum exertion. OSA on CPAP, Compliance is excellent. She has h/o HFrEF followed by cardiology. Her recent echo 3 months ago showed increased pulmonary pressures, She had multiple non invasive cardiac studies but don't see a cath report in last 10 years. She has biventricular ICD. Pulmonary work up is negative, normal pfts and cxr. Pt also has gained over 40 lbs since last 6 months.  She does not want to go through PT due to this    Current Medication: Outpatient Encounter Medications as of 04/04/2020  Medication Sig Note  . albuterol (VENTOLIN HFA) 108 (90 Base) MCG/ACT inhaler Inhale 2 puffs into the lungs every 6 (six) hours as needed for wheezing or shortness of breath.   [name] alendronate (FOSAMAX) 70 MG tablet Take 1 tablet (70 mg total) by mouth every 7 (seven) days. Take with a full glass of water on an empty stomach.   [name] amLODipine (NORVASC) 10 MG tablet Take 1 tab po daily   . cholecalciferol (VITAMIN D) 1000 units tablet Take 1,000 Units by mouth daily.   . cilostazol (PLETAL) 50 MG tablet Take 1 tablet (50 mg total) by mouth 2 (two) times daily.   [name] dicyclomine (BENTYL) 10 MG capsule Take 1 capsule po TID prn intestinal cramping/diarrhea   . diphenoxylate-atropine (LOMOTIL) 2.5-0.025 MG tablet Take 1 tablet by mouth 4 (four) times daily as needed for diarrhea or loose stools.   . docusate sodium (STOOL SOFTENER) 100 MG capsule Take 200 mg by mouth daily.  06/07/2015: Received from: [facility]  . DULoxetine (CYMBALTA) 30 MG capsule Take 1 capsule  po bid   . esomeprazole (NEXIUM) 20 MG capsule Take 20 mg by mouth daily at 12 noon.   [name] ESOMEPRAZOLE MAGNESIUM PO Take 40 mg by mouth.   . Fluticasone-Salmeterol (ADVAIR) 250-50 MCG/DOSE AEPB Inhale 1 puff into the lungs 2 (two) times daily.   . furosemide (LASIX) 20 MG tablet Take 1 tablet (20 mg total) by mouth daily.   [name] gabapentin (NEURONTIN) 300 MG capsule Take 2 cap twice a daily for neuropathy   . hydrochlorothiazide (HYDRODIURIL) 25 MG tablet Take 25 mg by mouth daily.   [name] HYDROcodone-acetaminophen (NORCO/VICODIN) 5-325 MG tablet Take 1 tablet by mouth every 6 (six) hours as needed for moderate pain.   [name] ibuprofen (ADVIL) 800 MG tablet Take 1 tablet (800 mg total) by mouth 2 (two) times daily as needed. Reported on 06/22/2015   . ipratropium-albuterol (DUONEB) 0.5-2.5 (3) MG/3ML SOLN Take 3 mLs by nebulization every 4 (four) hours as needed.   [name] levothyroxine (SYNTHROID) 50 MCG tablet Take 1 tablet (50 mcg total) by mouth daily.   . Lidocaine HCl 3 % GEL Reported on 06/22/2015 06/07/2015: Received from: [facility]  . lisinopril (ZESTRIL) 20 MG tablet Take 1 tablet (20 mg total) by mouth daily.   . Magnesium 250 MG TABS Take by mouth. 06/22/2015: Reports taking once a day  . metoprolol succinate (TOPROL-XL) 25 MG 24 hr tablet Take  12.5 mg by mouth.   . ondansetron (ZOFRAN) 8 MG tablet Take 1 tablet (8 mg total) by mouth every 8 (eight) hours as needed for nausea or vomiting.   [name] oxybutynin (DITROPAN-XL) 5 MG 24 hr tablet Take 1 tablet (5 mg total) by mouth 2 (two) times daily.   . Potassium 99 MG TABS Take by mouth. 06/16/2015: Takes 2 tablets everyday.  [name] spironolactone (ALDACTONE) 25 MG tablet    . SPS 15 GM/60ML suspension    . sulfamethoxazole-trimethoprim (BACTRIM DS) 800-160 MG tablet Take 1 tablet by mouth 2 (two) times daily.   . traMADol (ULTRAM) 50 MG tablet Take 1 tablet (50 mg total) by mouth 2 (two) times daily. One tab po bid prn for pain   . [DISCONTINUED]  methylPREDNISolone (MEDROL) 4 MG TBPK tablet Take by mouth as directed for 6 days   . rosuvastatin (CRESTOR) 10 MG tablet Take one tab  Po 2 x aweek   . [DISCONTINUED] ciprofloxacin (CIPRO) 500 MG tablet Take 1 tablet (500 mg total) by mouth 2 (two) times daily. (Patient not taking: Reported on 04/04/2020)    No facility-administered encounter medications on file as of 04/04/2020.    Surgical History: Past Surgical History:  Procedure Laterality Date  . ABCESS DRAINAGE  2016   Abdominal abcess due to diverticulitis  . caridoverter defibrillator  11/14/2017   ICD  . CATARACT EXTRACTION W/PHACO Left 10/26/2015   Procedure: CATARACT EXTRACTION PHACO AND INTRAOCULAR LENS PLACEMENT (IOC) LEFT EYE;  Surgeon: Leandrew Koyanagi, MD;  Location: [place];  Service: Ophthalmology;  Laterality: Left;  . CATARACT EXTRACTION W/PHACO Right 12/07/2015   Procedure: CATARACT EXTRACTION PHACO AND INTRAOCULAR LENS PLACEMENT (IOC);  Surgeon: Leandrew Koyanagi, MD;  Location: [place];  Service: Ophthalmology;  Laterality: Right;  . CHOLECYSTECTOMY    . FOOT SURGERY     Per patient, bilateral foot surgery.  [name] PELVIC FRACTURE SURGERY     Per patient for cancer.    Medical History: Past Medical History:  Diagnosis Date  . Arthritis   . Asthma   . Cancer ([place])   . Deep vein thrombosis (DVT) ([place]) 2015  . Depression   . GERD (gastroesophageal reflux disease)   . Hypertension   . Weakness of both legs     Family History: Family History  Problem Relation Age of Onset  . Depression Sister   . Heart disease Sister   . Early death Sister   . Depression Sister   . Depression Sister   . Depression Sister   . Depression Sister     Social History   Socioeconomic History  . Marital status: Married    Spouse name: Not on file  . Number of children: Not on file  . Years of education: Not on file  . Highest education level: Not on file  Occupational History  . Not on file   Tobacco Use  . Smoking status: Never Smoker  . Smokeless tobacco: Never Used  Vaping Use  . Vaping Use: Never used  Substance and Sexual Activity  . Alcohol use: No  . Drug use: No  . Sexual activity: Not Currently  Other Topics Concern  . Not on file  Social History Narrative  . Not on file   Social Determinants of Health   Financial Resource Strain:   . Difficulty of Paying Living Expenses: Not on file  Food Insecurity:   . Worried About Charity fundraiser in the Last Year: Not on file  .  Ran Out of Food in the Last Year: Not on file  Transportation Needs:   . Lack of Transportation (Medical): Not on file  . Lack of Transportation (Non-Medical): Not on file  Physical Activity:   . Days of Exercise per Week: Not on file  . Minutes of Exercise per Session: Not on file  Stress:   . Feeling of Stress : Not on file  Social Connections:   . Frequency of Communication with Friends and Family: Not on file  . Frequency of Social Gatherings with Friends and Family: Not on file  . Attends Religious Services: Not on file  . Active Member of Clubs or Organizations: Not on file  . Attends Archivist Meetings: Not on file  . Marital Status: Not on file  Intimate Partner Violence:   . Fear of Current or Ex-Partner: Not on file  . Emotionally Abused: Not on file  . Physically Abused: Not on file  . Sexually Abused: Not on file      Review of Systems  Constitutional: Negative for chills, diaphoresis and fatigue.  HENT: Negative for ear pain, postnasal drip and sinus pressure.   Eyes: Negative for photophobia, discharge, redness, itching and visual disturbance.  Respiratory: Positive for shortness of breath. Negative for cough and wheezing.   Cardiovascular: Negative for chest pain, palpitations and leg swelling.  Gastrointestinal: Negative for abdominal pain, constipation, diarrhea, nausea and vomiting.  Genitourinary: Negative for dysuria and flank pain.   Musculoskeletal: Negative for arthralgias, back pain, gait problem and neck pain.  Skin: Negative for color change.  Allergic/Immunologic: Negative for environmental allergies and food allergies.  Neurological: Negative for dizziness and headaches.  Hematological: Does not bruise/bleed easily.  Psychiatric/Behavioral: Negative for agitation, behavioral problems (depression) and hallucinations.    Vital Signs: BP (!) 142/76   Pulse 85   Temp 97.9 F (36.6 C)   Ht 5\' 2"  (1.575 m)   Wt 274 lb 12.8 oz (124.6 kg)   SpO2 96%   BMI 50.26 kg/m    Physical Exam Constitutional:      General: She is not in acute distress.    Appearance: She is well-developed. She is not diaphoretic.  HENT:     Head: Normocephalic and atraumatic.     Mouth/Throat:     Pharynx: No oropharyngeal exudate.  Eyes:     Pupils: Pupils are equal, round, and reactive to light.  Neck:     Thyroid: No thyromegaly.     Vascular: No JVD.     Trachea: No tracheal deviation.  Cardiovascular:     Rate and Rhythm: Normal rate and regular rhythm.     Heart sounds: Normal heart sounds. No murmur heard.  No friction rub. No gallop.   Pulmonary:     Effort: Pulmonary effort is normal.     Breath sounds: Normal breath sounds. No rales.  Chest:     Chest wall: No tenderness.  Abdominal:     General: Bowel sounds are normal.     Palpations: Abdomen is soft.  Musculoskeletal:        General: Normal range of motion.     Cervical back: Normal range of motion and neck supple.  Lymphadenopathy:     Cervical: No cervical adenopathy.  Skin:    General: Skin is warm and dry.  Neurological:     General: No focal deficit present.     Mental Status: She is alert and oriented to person, place, and time.  Cranial Nerves: No cranial nerve deficit.  Psychiatric:        Behavior: Behavior normal.        Thought Content: Thought content normal.        Judgment: Judgment normal.     Assessment/Plan: 1. Dyspnea on  exertion 6 Min walk did not show any desaturation however became Dizzy and weak (deconditioning)  - Pulse oximetry, overnight; Future  2. PAH (pulmonary artery hypertension) ([place]) Recent Echo showed moderate PH, she might need right and left heart cath, will speak with cardiology  - 6 minute walk - Ambulatory referral to Cardiology  3. Atherosclerosis of aorta ([place]) - Start low dose Crestor as prescribed today   4. OSA on CPAP Continue CPAP as before, will check overnight oxygen  - Pulse oximetry, overnight; Future  General Counseling: Graycee verbalizes understanding of the findings of todays visit and agrees with plan of treatment. I have discussed any further diagnostic evaluation that may be needed or ordered today. We also reviewed her medications today. she has been encouraged to call the office with any questions or concerns that should arise related to todays visit.   Orders Placed This Encounter  Procedures  . Ambulatory referral to Cardiology  . Pulse oximetry, overnight  . 6 minute walk    Total time spent:35 Minutes Time spent includes review of chart, medications, test results, and follow up plan with the patient.    Dr Lavera Guise Internal medicine

## 2020-04-06 ENCOUNTER — Encounter: Payer: Self-pay | Admitting: Internal Medicine

## 2020-04-06 ENCOUNTER — Ambulatory Visit (INDEPENDENT_AMBULATORY_CARE_PROVIDER_SITE_OTHER): Payer: Medicare HMO | Admitting: Hospice and Palliative Medicine

## 2020-04-06 ENCOUNTER — Telehealth: Payer: Self-pay

## 2020-04-06 ENCOUNTER — Encounter: Payer: Self-pay | Admitting: Hospice and Palliative Medicine

## 2020-04-06 ENCOUNTER — Other Ambulatory Visit: Payer: Self-pay

## 2020-04-06 VITALS — BP 148/82 | HR 87 | Temp 98.4°F | Resp 16 | Ht 62.0 in | Wt 274.0 lb

## 2020-04-06 DIAGNOSIS — N289 Disorder of kidney and ureter, unspecified: Secondary | ICD-10-CM

## 2020-04-06 DIAGNOSIS — I10 Essential (primary) hypertension: Secondary | ICD-10-CM | POA: Diagnosis not present

## 2020-04-06 DIAGNOSIS — I5022 Chronic systolic (congestive) heart failure: Secondary | ICD-10-CM | POA: Diagnosis not present

## 2020-04-06 DIAGNOSIS — I2721 Secondary pulmonary arterial hypertension: Secondary | ICD-10-CM

## 2020-04-06 MED ORDER — ROSUVASTATIN CALCIUM 10 MG PO TABS: ORAL_TABLET | ORAL | 3 refills | Status: DC

## 2020-04-06 NOTE — Telephone Encounter (Signed)
2nd order for overnight oxi test has been place in [place] home patient box, ready for pick up.  Diana Bernard stated that she doesn't answer calls from numbers not saved in her phone so she probably ignored the first call to setup that test.

## 2020-04-06 NOTE — Progress Notes (Signed)
[facility] [place], [place] [address]  Internal MEDICINE  Office Visit Note  Patient Name: Diana Bernard  [id number]  [id number]  Date of Service: 04/11/2020  Chief Complaint  Patient presents with  . Follow-up    6 week     HPI Patient is here for routine follow-up Continues to complain of increased shortness of breath Has been evaluated by pulmonology--pulmonary function remains stable, no clear cause of increased shortness of breath--normal PFT and CXR Extensive cardiac history--HFrEF, increased pulmonary pressures, biventricular ICD placement Noted 40 pound weight gain in last 6 months Last follow-up with cardiology--12/2019 added Spironolactone, has had biventricular defibrillator interrogated 33/82--NK complications noted, return in 1 year Has been taking furosemide 20 mg daily over the last few weeks due to increased shortness of breath--was previously taking it every other day as instructed by cardiologist  Current Medication: Outpatient Encounter Medications as of 04/06/2020  Medication Sig Note  . albuterol (VENTOLIN HFA) 108 (90 Base) MCG/ACT inhaler Inhale 2 puffs into the lungs every 6 (six) hours as needed for wheezing or shortness of breath.   [name] alendronate (FOSAMAX) 70 MG tablet Take 1 tablet (70 mg total) by mouth every 7 (seven) days. Take with a full glass of water on an empty stomach.   . cholecalciferol (VITAMIN D) 1000 units tablet Take 1,000 Units by mouth daily.   . Cyanocobalamin (VITAMIN B-12 PO) Take by mouth. Pt takes [place] gummies   . diclofenac Sodium (VOLTAREN) 1 % GEL Apply topically 4 (four) times daily.   [name] docusate sodium (STOOL SOFTENER) 100 MG capsule Take 200 mg by mouth daily.  06/07/2015: Received from: [facility]  . DULoxetine (CYMBALTA) 30 MG capsule Take 1 capsule po bid   . esomeprazole (NEXIUM) 20 MG capsule Take 20 mg by mouth daily at 12 noon.   . Fluticasone-Salmeterol (ADVAIR) 250-50  MCG/DOSE AEPB Inhale 1 puff into the lungs 2 (two) times daily.   . furosemide (LASIX) 20 MG tablet Take 1 tablet (20 mg total) by mouth daily.   [name] gabapentin (NEURONTIN) 300 MG capsule Take 2 cap twice a daily for neuropathy   . ibuprofen (ADVIL) 800 MG tablet Take 1 tablet (800 mg total) by mouth 2 (two) times daily as needed. Reported on 06/22/2015   . ipratropium-albuterol (DUONEB) 0.5-2.5 (3) MG/3ML SOLN Take 3 mLs by nebulization every 4 (four) hours as needed.   [name] levothyroxine (SYNTHROID) 50 MCG tablet Take 1 tablet (50 mcg total) by mouth daily.   [name] lisinopril (ZESTRIL) 20 MG tablet Take 1 tablet (20 mg total) by mouth daily.   . Magnesium 250 MG TABS Take by mouth. 06/22/2015: Reports taking once a day  . Potassium 99 MG TABS Take by mouth. 06/16/2015: Takes 2 tablets everyday.  [name] spironolactone (ALDACTONE) 25 MG tablet    . cilostazol (PLETAL) 50 MG tablet Take 1 tablet (50 mg total) by mouth 2 (two) times daily. (Patient not taking: Reported on 04/06/2020)   . dicyclomine (BENTYL) 10 MG capsule Take 1 capsule po TID prn intestinal cramping/diarrhea (Patient not taking: Reported on 04/06/2020)   . diphenoxylate-atropine (LOMOTIL) 2.5-0.025 MG tablet Take 1 tablet by mouth 4 (four) times daily as needed for diarrhea or loose stools. (Patient not taking: Reported on 04/06/2020)   . HYDROcodone-acetaminophen (NORCO/VICODIN) 5-325 MG tablet Take 1 tablet by mouth every 6 (six) hours as needed for moderate pain. (Patient not taking: Reported on 04/06/2020)   . [DISCONTINUED] amLODipine (NORVASC) 10  MG tablet Take 1 tab po daily (Patient not taking: Reported on 04/06/2020)   . [DISCONTINUED] ESOMEPRAZOLE MAGNESIUM PO Take 40 mg by mouth. (Patient not taking: Reported on 04/06/2020)   . [DISCONTINUED] hydrochlorothiazide (HYDRODIURIL) 25 MG tablet Take 25 mg by mouth daily. (Patient not taking: Reported on 04/06/2020)   . [DISCONTINUED] Lidocaine HCl 3 % GEL Reported on 06/22/2015 (Patient not taking:  Reported on 04/06/2020) 06/07/2015: Received from: [facility]  . [DISCONTINUED] metoprolol succinate (TOPROL-XL) 25 MG 24 hr tablet Take 12.5 mg by mouth. (Patient not taking: Reported on 04/06/2020)   . [DISCONTINUED] ondansetron (ZOFRAN) 8 MG tablet Take 1 tablet (8 mg total) by mouth every 8 (eight) hours as needed for nausea or vomiting. (Patient not taking: Reported on 04/06/2020)   . [DISCONTINUED] oxybutynin (DITROPAN-XL) 5 MG 24 hr tablet Take 1 tablet (5 mg total) by mouth 2 (two) times daily. (Patient not taking: Reported on 04/06/2020)   . [DISCONTINUED] rosuvastatin (CRESTOR) 10 MG tablet Take one tab  Po 2 x aweek (Patient not taking: Reported on 04/06/2020)   . [DISCONTINUED] SPS 15 GM/60ML suspension  (Patient not taking: Reported on 04/06/2020)   . [DISCONTINUED] sulfamethoxazole-trimethoprim (BACTRIM DS) 800-160 MG tablet Take 1 tablet by mouth 2 (two) times daily. (Patient not taking: Reported on 04/06/2020)   . [DISCONTINUED] traMADol (ULTRAM) 50 MG tablet Take 1 tablet (50 mg total) by mouth 2 (two) times daily. One tab po bid prn for pain (Patient not taking: Reported on 04/06/2020)    No facility-administered encounter medications on file as of 04/06/2020.    Surgical History: Past Surgical History:  Procedure Laterality Date  . ABCESS DRAINAGE  2016   Abdominal abcess due to diverticulitis  . caridoverter defibrillator  11/14/2017   ICD  . CATARACT EXTRACTION W/PHACO Left 10/26/2015   Procedure: CATARACT EXTRACTION PHACO AND INTRAOCULAR LENS PLACEMENT (IOC) LEFT EYE;  Surgeon: Leandrew Koyanagi, MD;  Location: [place];  Service: Ophthalmology;  Laterality: Left;  . CATARACT EXTRACTION W/PHACO Right 12/07/2015   Procedure: CATARACT EXTRACTION PHACO AND INTRAOCULAR LENS PLACEMENT (IOC);  Surgeon: Leandrew Koyanagi, MD;  Location: [place];  Service: Ophthalmology;  Laterality: Right;  . CHOLECYSTECTOMY    . FOOT SURGERY     Per patient, bilateral foot  surgery.  [name] PELVIC FRACTURE SURGERY     Per patient for cancer.    Medical History: Past Medical History:  Diagnosis Date  . Arthritis   . Asthma   . Cancer ([place])   . Deep vein thrombosis (DVT) ([place]) 2015  . Depression   . GERD (gastroesophageal reflux disease)   . Hypertension   . Weakness of both legs     Family History: Family History  Problem Relation Age of Onset  . Depression Sister   . Heart disease Sister   . Early death Sister   . Depression Sister   . Depression Sister   . Depression Sister   . Depression Sister     Social History   Socioeconomic History  . Marital status: Married    Spouse name: Not on file  . Number of children: Not on file  . Years of education: Not on file  . Highest education level: Not on file  Occupational History  . Not on file  Tobacco Use  . Smoking status: Never Smoker  . Smokeless tobacco: Never Used  Vaping Use  . Vaping Use: Never used  Substance and Sexual Activity  . Alcohol use: No  .  Drug use: No  . Sexual activity: Not Currently  Other Topics Concern  . Not on file  Social History Narrative  . Not on file   Social Determinants of Health   Financial Resource Strain: Not on file  Food Insecurity: Not on file  Transportation Needs: Not on file  Physical Activity: Not on file  Stress: Not on file  Social Connections: Not on file  Intimate Partner Violence: Not on file     Review of Systems  Constitutional: Negative for chills, diaphoresis and fatigue.  HENT: Negative for ear pain, postnasal drip and sinus pressure.   Eyes: Negative for photophobia, discharge, redness, itching and visual disturbance.  Respiratory: Positive for shortness of breath. Negative for cough and wheezing.   Cardiovascular: Negative for chest pain, palpitations and leg swelling.  Gastrointestinal: Negative for abdominal pain, constipation, diarrhea, nausea and vomiting.  Genitourinary: Negative for dysuria and flank pain.   Musculoskeletal: Negative for arthralgias, back pain, gait problem and neck pain.  Skin: Negative for color change.  Allergic/Immunologic: Negative for environmental allergies and food allergies.  Neurological: Negative for dizziness and headaches.  Hematological: Does not bruise/bleed easily.  Psychiatric/Behavioral: Negative for agitation, behavioral problems (depression) and hallucinations.    Vital Signs: BP (!) 148/82   Pulse 87   Temp 98.4 F (36.9 C)   Resp 16   Ht 5\' 2"  (1.575 m)   Wt 274 lb (124.3 kg)   SpO2 96%   BMI 50.12 kg/m    Physical Exam Constitutional:      Appearance: Normal appearance. She is obese.  Cardiovascular:     Rate and Rhythm: Regular rhythm.     Pulses: Normal pulses.     Heart sounds: Normal heart sounds.  Pulmonary:     Effort: Pulmonary effort is normal.     Breath sounds: Normal breath sounds.  Musculoskeletal:        General: Normal range of motion.     Cervical back: Normal range of motion.     Right lower leg: 1+ Pitting Edema present.     Left lower leg: 1+ Pitting Edema present.  Skin:    General: Skin is warm.  Neurological:     General: No focal deficit present.     Mental Status: She is alert and oriented to person, place, and time. Mental status is at baseline.  Psychiatric:        Mood and Affect: Mood normal.        Behavior: Behavior normal.        Thought Content: Thought content normal.        Judgment: Judgment normal.    Assessment/Plan: 1. Chronic systolic heart failure (HCC) Worsening dyspnea likely underlying cardiac etiology Continue with daily dose of furosemide, advised to contact cardiologist and schedule follow-up  2. PAH (pulmonary artery hypertension) ([place]) Again, continue with daily furosemide dosing, may benefit from heart cath to further assess PA pressures  3. Essential hypertension BP elevated today, initially due to stress of walking to exam room, was slowly improving on recheck, again  encouraged to follow-up with cardiologist as soon as possible  4. Abnormal kidney function Will recheck kidney function and adjust plan of care as indicated - Comprehensive Metabolic Panel (CMET)  General Counseling: Cherissa verbalizes understanding of the findings of todays visit and agrees with plan of treatment. I have discussed any further diagnostic evaluation that may be needed or ordered today. We also reviewed her medications today. she has been encouraged to call  the office with any questions or concerns that should arise related to todays visit.    Orders Placed This Encounter  Procedures  . Comprehensive Metabolic Panel (CMET)    Time spent: 30 Minutes Time spent includes review of chart, medications, test results and follow-up plan with the patient.  This patient was seen by Theodoro Grist AGNP-C in Collaboration with Dr Lavera Guise as a part of collaborative care agreement     Tanna Furry. Clary Meeker AGNP-C Internal medicine

## 2020-04-08 DIAGNOSIS — I429 Cardiomyopathy, unspecified: Secondary | ICD-10-CM | POA: Diagnosis not present

## 2020-04-08 DIAGNOSIS — I5022 Chronic systolic (congestive) heart failure: Secondary | ICD-10-CM | POA: Diagnosis not present

## 2020-04-08 DIAGNOSIS — N289 Disorder of kidney and ureter, unspecified: Secondary | ICD-10-CM | POA: Diagnosis not present

## 2020-04-09 LAB — COMPREHENSIVE METABOLIC PANEL
ALT: 30 IU/L (ref 0–32)
AST: 37 IU/L (ref 0–40)
Albumin/Globulin Ratio: 1.7 (ref 1.2–2.2)
Albumin: 4.2 g/dL (ref 3.7–4.7)
Alkaline Phosphatase: 50 IU/L (ref 44–121)
BUN/Creatinine Ratio: 16 (ref 12–28)
BUN: 18 mg/dL (ref 8–27)
Bilirubin Total: 0.3 mg/dL (ref 0.0–1.2)
CO2: 25 mmol/L (ref 20–29)
Calcium: 9.7 mg/dL (ref 8.7–10.3)
Chloride: 99 mmol/L (ref 96–106)
Creatinine, Ser: 1.15 mg/dL — ABNORMAL HIGH (ref 0.57–1.00)
GFR calc Af Amer: 55 mL/min/{1.73_m2} — ABNORMAL LOW (ref >59)
GFR calc non Af Amer: 48 mL/min/{1.73_m2} — ABNORMAL LOW (ref >59)
Globulin, Total: 2.5 g/dL (ref 1.5–4.5)
Glucose: 95 mg/dL (ref 65–99)
Potassium: 4.4 mmol/L (ref 3.5–5.2)
Sodium: 140 mmol/L (ref 134–144)
Total Protein: 6.7 g/dL (ref 6.0–8.5)

## 2020-04-11 ENCOUNTER — Encounter: Payer: Self-pay | Admitting: Hospice and Palliative Medicine

## 2020-04-13 NOTE — Progress Notes (Signed)
Labs reviewed, will discuss at next follow-up visit.

## 2020-04-14 NOTE — Progress Notes (Signed)
Pt needs right heart cath at [facility]

## 2020-04-18 DIAGNOSIS — G4733 Obstructive sleep apnea (adult) (pediatric): Secondary | ICD-10-CM | POA: Diagnosis not present

## 2020-04-19 ENCOUNTER — Telehealth: Payer: Self-pay

## 2020-04-19 NOTE — Telephone Encounter (Signed)
Over night oximetry is normal as per Diana Bernard continue Cpap

## 2020-05-02 DIAGNOSIS — I5022 Chronic systolic (congestive) heart failure: Secondary | ICD-10-CM | POA: Diagnosis not present

## 2020-05-06 ENCOUNTER — Ambulatory Visit (INDEPENDENT_AMBULATORY_CARE_PROVIDER_SITE_OTHER): Payer: Medicare HMO | Admitting: Hospice and Palliative Medicine

## 2020-05-06 ENCOUNTER — Other Ambulatory Visit: Payer: Self-pay

## 2020-05-06 ENCOUNTER — Encounter: Payer: Self-pay | Admitting: Hospice and Palliative Medicine

## 2020-05-06 VITALS — BP 130/68 | HR 70 | Temp 97.1°F | Resp 16 | Ht 60.0 in | Wt 274.8 lb

## 2020-05-06 DIAGNOSIS — Z9989 Dependence on other enabling machines and devices: Secondary | ICD-10-CM

## 2020-05-06 DIAGNOSIS — I2721 Secondary pulmonary arterial hypertension: Secondary | ICD-10-CM | POA: Diagnosis not present

## 2020-05-06 DIAGNOSIS — I5020 Unspecified systolic (congestive) heart failure: Secondary | ICD-10-CM

## 2020-05-06 DIAGNOSIS — R0602 Shortness of breath: Secondary | ICD-10-CM

## 2020-05-06 DIAGNOSIS — G4733 Obstructive sleep apnea (adult) (pediatric): Secondary | ICD-10-CM

## 2020-05-06 NOTE — Progress Notes (Signed)
[facility] [place], [place] [address]  Internal MEDICINE  Office Visit Note  Patient Name: Diana Bernard  [id number]  [id number]  Date of Service: 05/06/2020  Chief Complaint  Patient presents with  . Follow-up    Breathing, back pain   . Depression  . Gastroesophageal Reflux  . Hypertension  . Asthma  . Quality Metric Gaps    PNA    HPI Patient is here for routine follow-up Continues to complain of shortness of breath with exertion, she feels as though it is getting worse Was able to be seen by her cardiologist, they repeated her echocardiogram, stable, grade 2 diastolic dysfunction, EF 58-09%, right ventricle normal size, normal systolic function, plan for stress test for further evaluation Has been taking Lasix every other day due to muscle cramping associated with taking daily [facility] chart creatinine 12/10 1.42  Pulmonology standpoint, stable FEV1, has been using Advair daily Nightly adherence with CPAP therapy for OSA   Current Medication: Outpatient Encounter Medications as of 05/06/2020  Medication Sig Note  . diphenoxylate-atropine (LOMOTIL) 2.5-0.025 MG tablet Take 1 tablet by mouth 4 (four) times daily as needed for diarrhea or loose stools.   [name] albuterol (VENTOLIN HFA) 108 (90 Base) MCG/ACT inhaler Inhale 2 puffs into the lungs every 6 (six) hours as needed for wheezing or shortness of breath.   [name] alendronate (FOSAMAX) 70 MG tablet Take 1 tablet (70 mg total) by mouth every 7 (seven) days. Take with a full glass of water on an empty stomach.   . cholecalciferol (VITAMIN D) 1000 units tablet Take 1,000 Units by mouth daily.   . Cyanocobalamin (VITAMIN B-12 PO) Take by mouth. Pt takes [facility] [place] gummies   . diclofenac Sodium (VOLTAREN) 1 % GEL Apply topically 4 (four) times daily.   [name] docusate sodium (STOOL SOFTENER) 100 MG capsule Take 200 mg by mouth daily.  06/07/2015: Received from: [facility]  . DULoxetine (CYMBALTA) 30 MG  capsule Take 1 capsule po bid   . esomeprazole (NEXIUM) 20 MG capsule Take 20 mg by mouth daily at 12 noon.   . Fluticasone-Salmeterol (ADVAIR) 250-50 MCG/DOSE AEPB Inhale 1 puff into the lungs 2 (two) times daily.   . furosemide (LASIX) 20 MG tablet Take 1 tablet (20 mg total) by mouth daily.   [name] gabapentin (NEURONTIN) 300 MG capsule Take 2 cap twice a daily for neuropathy   . ibuprofen (ADVIL) 800 MG tablet Take 1 tablet (800 mg total) by mouth 2 (two) times daily as needed. Reported on 06/22/2015   . ipratropium-albuterol (DUONEB) 0.5-2.5 (3) MG/3ML SOLN Take 3 mLs by nebulization every 4 (four) hours as needed.   [name] levothyroxine (SYNTHROID) 50 MCG tablet Take 1 tablet (50 mcg total) by mouth daily.   [name] lisinopril (ZESTRIL) 20 MG tablet Take 1 tablet (20 mg total) by mouth daily.   . Magnesium 250 MG TABS Take by mouth. 06/22/2015: Reports taking once a day  . Potassium 99 MG TABS Take by mouth. 06/16/2015: Takes 2 tablets everyday.  [name] spironolactone (ALDACTONE) 25 MG tablet    . [DISCONTINUED] cilostazol (PLETAL) 50 MG tablet Take 1 tablet (50 mg total) by mouth 2 (two) times daily. (Patient not taking: Reported on 04/06/2020)   . [DISCONTINUED] dicyclomine (BENTYL) 10 MG capsule Take 1 capsule po TID prn intestinal cramping/diarrhea (Patient not taking: Reported on 04/06/2020)   . [DISCONTINUED] HYDROcodone-acetaminophen (NORCO/VICODIN) 5-325 MG tablet Take 1 tablet by mouth every 6 (six) hours as  needed for moderate pain. (Patient not taking: Reported on 04/06/2020)    No facility-administered encounter medications on file as of 05/06/2020.    Surgical History: Past Surgical History:  Procedure Laterality Date  . ABCESS DRAINAGE  2016   Abdominal abcess due to diverticulitis  . caridoverter defibrillator  11/14/2017   ICD  . CATARACT EXTRACTION W/PHACO Left 10/26/2015   Procedure: CATARACT EXTRACTION PHACO AND INTRAOCULAR LENS PLACEMENT (IOC) LEFT EYE;  Surgeon: Leandrew Koyanagi, MD;   Location: [place];  Service: Ophthalmology;  Laterality: Left;  . CATARACT EXTRACTION W/PHACO Right 12/07/2015   Procedure: CATARACT EXTRACTION PHACO AND INTRAOCULAR LENS PLACEMENT (IOC);  Surgeon: Leandrew Koyanagi, MD;  Location: [place];  Service: Ophthalmology;  Laterality: Right;  . CHOLECYSTECTOMY    . FOOT SURGERY     Per patient, bilateral foot surgery.  [name] PELVIC FRACTURE SURGERY     Per patient for cancer.    Medical History: Past Medical History:  Diagnosis Date  . Arthritis   . Asthma   . Cancer ([place])   . Deep vein thrombosis (DVT) ([place]) 2015  . Depression   . GERD (gastroesophageal reflux disease)   . Hypertension   . Weakness of both legs     Family History: Family History  Problem Relation Age of Onset  . Depression Sister   . Heart disease Sister   . Early death Sister   . Depression Sister   . Depression Sister   . Depression Sister   . Depression Sister     Social History   Socioeconomic History  . Marital status: Married    Spouse name: Not on file  . Number of children: Not on file  . Years of education: Not on file  . Highest education level: Not on file  Occupational History  . Not on file  Tobacco Use  . Smoking status: Never Smoker  . Smokeless tobacco: Never Used  Vaping Use  . Vaping Use: Never used  Substance and Sexual Activity  . Alcohol use: No  . Drug use: No  . Sexual activity: Not Currently  Other Topics Concern  . Not on file  Social History Narrative  . Not on file   Social Determinants of Health   Financial Resource Strain: Not on file  Food Insecurity: Not on file  Transportation Needs: Not on file  Physical Activity: Not on file  Stress: Not on file  Social Connections: Not on file  Intimate Partner Violence: Not on file      Review of Systems  Constitutional: Negative for chills, diaphoresis and fatigue.  HENT: Negative for ear pain, postnasal drip and sinus pressure.   Eyes:  Negative for photophobia, discharge, redness, itching and visual disturbance.  Respiratory: Positive for shortness of breath. Negative for cough and wheezing.   Cardiovascular: Negative for chest pain, palpitations and leg swelling.  Gastrointestinal: Negative for abdominal pain, constipation, diarrhea, nausea and vomiting.  Genitourinary: Negative for dysuria and flank pain.  Musculoskeletal: Negative for arthralgias, back pain, gait problem and neck pain.  Skin: Negative for color change.  Allergic/Immunologic: Negative for environmental allergies and food allergies.  Neurological: Negative for dizziness and headaches.  Hematological: Does not bruise/bleed easily.  Psychiatric/Behavioral: Negative for agitation, behavioral problems (depression) and hallucinations.    Vital Signs: BP 130/68   Pulse 70   Temp (!) 97.1 F (36.2 C)   Resp 16   Ht 5' (1.524 m)   Wt 274 lb 12.8 oz (124.6 kg)  SpO2 98%   BMI 53.67 kg/m    Physical Exam Vitals reviewed.  Constitutional:      Appearance: Normal appearance. She is obese.  Cardiovascular:     Rate and Rhythm: Normal rate and regular rhythm.     Pulses: Normal pulses.     Heart sounds: Normal heart sounds.  Pulmonary:     Effort: Pulmonary effort is normal.     Breath sounds: Normal breath sounds.  Musculoskeletal:        General: Normal range of motion.  Skin:    General: Skin is warm.  Neurological:     General: No focal deficit present.     Mental Status: She is alert and oriented to person, place, and time. Mental status is at baseline.  Psychiatric:        Mood and Affect: Mood normal.        Behavior: Behavior normal.        Thought Content: Thought content normal.        Judgment: Judgment normal.    Assessment/Plan: 1. HFrEF (heart failure with reduced ejection fraction) (HCC) EF remains stable, no evidence of fluid overload Follow-up after stress test  2. PAH (pulmonary artery hypertension) ([place]) Recommend  right heart cath to assess pulmonary pressures  3. Shortness of breath Samples of Trelegy given in office today--if no improvement in symptoms restart Advair  4. OSA on CPAP Continue wit nightly compliance, may consider bipap therapy  General Counseling: Elenore verbalizes understanding of the findings of todays visit and agrees with plan of treatment. I have discussed any further diagnostic evaluation that may be needed or ordered today. We also reviewed her medications today. she has been encouraged to call the office with any questions or concerns that should arise related to todays visit.   Time spent: 30 Minutes Time spent includes review of chart, medications, test results and follow-up plan with the patient.  This patient was seen by Theodoro Grist AGNP-C in Collaboration with Dr Lavera Guise as a part of collaborative care agreement     Tanna Furry. Lavonia Eager AGNP-C Internal medicine

## 2020-05-12 ENCOUNTER — Other Ambulatory Visit: Payer: Self-pay

## 2020-05-12 MED ORDER — GABAPENTIN 300 MG PO CAPS
ORAL_CAPSULE | ORAL | 1 refills | Status: DC
Start: 2020-05-12 — End: 2020-10-12

## 2020-05-24 ENCOUNTER — Other Ambulatory Visit: Payer: Self-pay

## 2020-05-24 MED ORDER — FLUTICASONE-SALMETEROL 250-50 MCG/DOSE IN AEPB: 1.0000 | INHALATION_SPRAY | Freq: Two times a day (BID) | RESPIRATORY_TRACT | 0 refills | Status: DC

## 2020-06-03 DIAGNOSIS — Z6841 Body Mass Index (BMI) 40.0 and over, adult: Secondary | ICD-10-CM | POA: Diagnosis not present

## 2020-06-17 ENCOUNTER — Ambulatory Visit (INDEPENDENT_AMBULATORY_CARE_PROVIDER_SITE_OTHER): Payer: Medicare HMO | Admitting: Hospice and Palliative Medicine

## 2020-06-17 ENCOUNTER — Encounter: Payer: Self-pay | Admitting: Hospice and Palliative Medicine

## 2020-06-17 ENCOUNTER — Other Ambulatory Visit: Payer: Self-pay

## 2020-06-17 VITALS — BP 138/82 | HR 90 | Temp 97.6°F | Resp 16 | Ht 60.0 in | Wt 276.0 lb

## 2020-06-17 DIAGNOSIS — R0602 Shortness of breath: Secondary | ICD-10-CM | POA: Diagnosis not present

## 2020-06-17 DIAGNOSIS — I10 Essential (primary) hypertension: Secondary | ICD-10-CM | POA: Diagnosis not present

## 2020-06-17 DIAGNOSIS — J0140 Acute pansinusitis, unspecified: Secondary | ICD-10-CM | POA: Diagnosis not present

## 2020-06-17 DIAGNOSIS — I5020 Unspecified systolic (congestive) heart failure: Secondary | ICD-10-CM | POA: Diagnosis not present

## 2020-06-17 DIAGNOSIS — Z4502 Encounter for adjustment and management of automatic implantable cardiac defibrillator: Secondary | ICD-10-CM | POA: Diagnosis not present

## 2020-06-17 DIAGNOSIS — G4733 Obstructive sleep apnea (adult) (pediatric): Secondary | ICD-10-CM

## 2020-06-17 DIAGNOSIS — Z6841 Body Mass Index (BMI) 40.0 and over, adult: Secondary | ICD-10-CM | POA: Diagnosis not present

## 2020-06-17 DIAGNOSIS — Z9989 Dependence on other enabling machines and devices: Secondary | ICD-10-CM

## 2020-06-17 DIAGNOSIS — I110 Hypertensive heart disease with heart failure: Secondary | ICD-10-CM | POA: Diagnosis not present

## 2020-06-17 MED ORDER — AZITHROMYCIN 250 MG PO TABS: ORAL_TABLET | ORAL | 0 refills | Status: DC

## 2020-06-17 NOTE — Progress Notes (Signed)
[facility], [place] [address]  Internal MEDICINE  Office Visit Note  Patient Name: Diana Bernard  [id number]  [id number]  Date of Service: 06/19/2020  Chief Complaint  Patient presents with  . Follow-up  . Depression  . Gastroesophageal Reflux  . Hypertension  . Quality Metric Gaps    mammogram  . Sinus Problem  . Abdominal Pain    HPI Patient is her for routine follow-up Continues to complain of shortness of breath with exertion--extensive pulmonary and cardiology work-up, fairly unrevealing, cardiologist has referred her to bariatric surgeon to discuss weight loss surgery She is not convinced and anxious about weight loss surgery, she would like to discuss alternative options to help her with weight loss C/o chest congestion, sinus pain and cough--symptoms started earlier this week, has been taking Mucinex but congestion is not improving   Current Medication: Outpatient Encounter Medications as of 06/17/2020  Medication Sig Note  . azithromycin (ZITHROMAX) 250 MG tablet Take one tab po qd for 10 days   . albuterol (VENTOLIN HFA) 108 (90 Base) MCG/ACT inhaler Inhale 2 puffs into the lungs every 6 (six) hours as needed for wheezing or shortness of breath.   [name] alendronate (FOSAMAX) 70 MG tablet Take 1 tablet (70 mg total) by mouth every 7 (seven) days. Take with a full glass of water on an empty stomach.   . cholecalciferol (VITAMIN D) 1000 units tablet Take 1,000 Units by mouth daily.   . Cyanocobalamin (VITAMIN B-12 PO) Take by mouth. Pt takes [facility] [place] gummies   . diclofenac Sodium (VOLTAREN) 1 % GEL Apply topically 4 (four) times daily.   . diphenoxylate-atropine (LOMOTIL) 2.5-0.025 MG tablet Take 1 tablet by mouth 4 (four) times daily as needed for diarrhea or loose stools.   . docusate sodium (STOOL SOFTENER) 100 MG capsule Take 200 mg by mouth daily.  06/07/2015: Received from: [facility]  . DULoxetine (CYMBALTA) 30 MG  capsule Take 1 capsule po bid   . esomeprazole (NEXIUM) 20 MG capsule Take 20 mg by mouth daily at 12 noon.   . Fluticasone-Salmeterol (ADVAIR) 250-50 MCG/DOSE AEPB Inhale 1 puff into the lungs 2 (two) times daily.   . furosemide (LASIX) 20 MG tablet Take 1 tablet (20 mg total) by mouth daily.   [name] gabapentin (NEURONTIN) 300 MG capsule Take 2 cap twice a daily for neuropathy   . ibuprofen (ADVIL) 800 MG tablet Take 1 tablet (800 mg total) by mouth 2 (two) times daily as needed. Reported on 06/22/2015   . ipratropium-albuterol (DUONEB) 0.5-2.5 (3) MG/3ML SOLN Take 3 mLs by nebulization every 4 (four) hours as needed.   [name] levothyroxine (SYNTHROID) 50 MCG tablet Take 1 tablet (50 mcg total) by mouth daily.   [name] lisinopril (ZESTRIL) 20 MG tablet Take 1 tablet (20 mg total) by mouth daily.   . Magnesium 250 MG TABS Take by mouth. 06/22/2015: Reports taking once a day  . Potassium 99 MG TABS Take by mouth. 06/16/2015: Takes 2 tablets everyday.  [name] spironolactone (ALDACTONE) 25 MG tablet     No facility-administered encounter medications on file as of 06/17/2020.    Surgical History: Past Surgical History:  Procedure Laterality Date  . ABCESS DRAINAGE  2016   Abdominal abcess due to diverticulitis  . caridoverter defibrillator  11/14/2017   ICD  . CATARACT EXTRACTION W/PHACO Left 10/26/2015   Procedure: CATARACT EXTRACTION PHACO AND INTRAOCULAR LENS PLACEMENT (IOC) LEFT EYE;  Surgeon: Leandrew Koyanagi, MD;  Location: [place];  Service: Ophthalmology;  Laterality: Left;  . CATARACT EXTRACTION W/PHACO Right 12/07/2015   Procedure: CATARACT EXTRACTION PHACO AND INTRAOCULAR LENS PLACEMENT (IOC);  Surgeon: Leandrew Koyanagi, MD;  Location: [place];  Service: Ophthalmology;  Laterality: Right;  . CHOLECYSTECTOMY    . FOOT SURGERY     Per patient, bilateral foot surgery.  [name] PELVIC FRACTURE SURGERY     Per patient for cancer.    Medical History: Past Medical History:   Diagnosis Date  . Arthritis   . Asthma   . Cancer ([place])   . Deep vein thrombosis (DVT) ([place]) 2015  . Depression   . GERD (gastroesophageal reflux disease)   . Hypertension   . Weakness of both legs     Family History: Family History  Problem Relation Age of Onset  . Depression Sister   . Heart disease Sister   . Early death Sister   . Depression Sister   . Depression Sister   . Depression Sister   . Depression Sister     Social History   Socioeconomic History  . Marital status: Married    Spouse name: Not on file  . Number of children: Not on file  . Years of education: Not on file  . Highest education level: Not on file  Occupational History  . Not on file  Tobacco Use  . Smoking status: Never Smoker  . Smokeless tobacco: Never Used  Vaping Use  . Vaping Use: Never used  Substance and Sexual Activity  . Alcohol use: No  . Drug use: No  . Sexual activity: Not Currently  Other Topics Concern  . Not on file  Social History Narrative  . Not on file   Social Determinants of Health   Financial Resource Strain: Not on file  Food Insecurity: Not on file  Transportation Needs: Not on file  Physical Activity: Not on file  Stress: Not on file  Social Connections: Not on file  Intimate Partner Violence: Not on file      Review of Systems  Constitutional: Negative for chills, diaphoresis and fatigue.  HENT: Negative for ear pain, postnasal drip and sinus pressure.   Eyes: Negative for photophobia, discharge, redness, itching and visual disturbance.  Respiratory: Positive for shortness of breath. Negative for cough and wheezing.   Cardiovascular: Negative for chest pain, palpitations and leg swelling.  Gastrointestinal: Negative for abdominal pain, constipation, diarrhea, nausea and vomiting.  Genitourinary: Negative for dysuria and flank pain.  Musculoskeletal: Negative for arthralgias, back pain, gait problem and neck pain.  Skin: Negative for color change.   Allergic/Immunologic: Negative for environmental allergies and food allergies.  Neurological: Negative for dizziness and headaches.  Hematological: Does not bruise/bleed easily.  Psychiatric/Behavioral: Negative for agitation, behavioral problems (depression) and hallucinations.    Vital Signs: BP 138/82   Pulse 90   Temp 97.6 F (36.4 C)   Resp 16   Ht 5' (1.524 m)   Wt 276 lb (125.2 kg)   SpO2 96%   BMI 53.90 kg/m    Physical Exam Vitals reviewed.  Constitutional:      Appearance: Normal appearance. She is obese.  Cardiovascular:     Rate and Rhythm: Normal rate and regular rhythm.     Pulses: Normal pulses.     Heart sounds: Normal heart sounds.  Pulmonary:     Effort: Pulmonary effort is normal.     Breath sounds: Normal breath sounds.  Abdominal:     General:  Abdomen is flat.     Palpations: Abdomen is soft.  Musculoskeletal:        General: Normal range of motion.     Cervical back: Normal range of motion.  Skin:    General: Skin is warm.  Neurological:     General: No focal deficit present.     Mental Status: She is alert and oriented to person, place, and time. Mental status is at baseline.  Psychiatric:        Mood and Affect: Mood normal.        Behavior: Behavior normal.        Thought Content: Thought content normal.        Judgment: Judgment normal.    Assessment/Plan: 1. HFrEF (heart failure with reduced ejection fraction) (HCC) Stable findings on recent repeat echocardiogram with cardiology--heart cath not felt to be warranted at this time  2. Morbid obesity (HCC) BMI 53//frequently skips meals, snacks often, inactive lifestyle due to comorbid conditions Will set up with weight loss specialist within our office next week--discussed due to multiple medical conditions would be high risk for weight loss surgery Consider referral to dietician as well Obesity Counseling: Risk Assessment: An assessment of behavioral risk factors was made today and  includes lack of exercise sedentary lifestyle, lack of portion control and poor dietary habits.  Risk Modification Advice: She was counseled on portion control guidelines. Restricting daily caloric intake to 1800. The detrimental long term effects of obesity on her health and ongoing poor compliance was also discussed with the patient.  3. Shortness of breath Extensive cardiac and pulmonary work-up, stable findings, no further work-up warranted at this time  4. Essential hypertension BP and HR well controlled today on current therapy, continue to monitor  5. OSA on CPAP Continue with nightly CPAP compliance, followed by pulmonology  6. Acute non-recurrent pansinusitis Treat with extended course of azithromycin--samples of Mucinex given in office today - azithromycin (ZITHROMAX) 250 MG tablet; Take one tab po qd for 10 days  Dispense: 10 tablet; Refill: 0  General Counseling: Diana Bernard verbalizes understanding of the findings of todays visit and agrees with plan of treatment. I have discussed any further diagnostic evaluation that may be needed or ordered today. We also reviewed her medications today. she has been encouraged to call the office with any questions or concerns that should arise related to todays visit.    Meds ordered this encounter  Medications  . azithromycin (ZITHROMAX) 250 MG tablet    Sig: Take one tab po qd for 10 days    Dispense:  10 tablet    Refill:  0    Time spent: 30 Minutes Time spent includes review of chart, medications, test results and follow-up plan with the patient.  This patient was seen by Theodoro Grist AGNP-C in Collaboration with Dr Lavera Guise as a part of collaborative care agreement     Tanna Furry. Yamilet Mcfayden AGNP-C Internal medicine

## 2020-06-19 ENCOUNTER — Encounter: Payer: Self-pay | Admitting: Hospice and Palliative Medicine

## 2020-06-22 ENCOUNTER — Ambulatory Visit: Payer: Medicare HMO | Admitting: Internal Medicine

## 2020-06-29 DIAGNOSIS — Z6841 Body Mass Index (BMI) 40.0 and over, adult: Secondary | ICD-10-CM | POA: Diagnosis not present

## 2020-06-29 DIAGNOSIS — E662 Morbid (severe) obesity with alveolar hypoventilation: Secondary | ICD-10-CM | POA: Diagnosis not present

## 2020-06-29 DIAGNOSIS — Z7951 Long term (current) use of inhaled steroids: Secondary | ICD-10-CM | POA: Diagnosis not present

## 2020-06-29 DIAGNOSIS — J441 Chronic obstructive pulmonary disease with (acute) exacerbation: Secondary | ICD-10-CM | POA: Diagnosis not present

## 2020-06-29 DIAGNOSIS — I739 Peripheral vascular disease, unspecified: Secondary | ICD-10-CM | POA: Diagnosis not present

## 2020-06-29 DIAGNOSIS — D692 Other nonthrombocytopenic purpura: Secondary | ICD-10-CM | POA: Diagnosis not present

## 2020-06-29 DIAGNOSIS — I5020 Unspecified systolic (congestive) heart failure: Secondary | ICD-10-CM | POA: Diagnosis not present

## 2020-06-29 DIAGNOSIS — E261 Secondary hyperaldosteronism: Secondary | ICD-10-CM | POA: Diagnosis not present

## 2020-07-13 ENCOUNTER — Other Ambulatory Visit: Payer: Self-pay

## 2020-07-20 ENCOUNTER — Telehealth: Payer: Self-pay | Admitting: Internal Medicine

## 2020-07-20 NOTE — Progress Notes (Signed)
  Chronic Care Management   Note  07/20/2020 Name: Diana Bernard MRN: [id number] DOB: January 22, 1946  Diana Bernard is a 75 y.o. year old female who is a primary care patient of Lavera Guise, MD. I reached out to Sharen Hones by phone today in response to a referral sent by Diana Bernard's PCP, Lavera Guise, MD.   Diana Bernard was given information about Chronic Care Management services today including:  1. CCM service includes personalized support from designated clinical staff supervised by her physician, including individualized plan of care and coordination with other care providers 2. 24/7 contact phone numbers for assistance for urgent and routine care needs. 3. Service will only be billed when office clinical staff spend 20 minutes or more in a month to coordinate care. 4. Only one practitioner may furnish and bill the service in a calendar month. 5. The patient may stop CCM services at any time (effective at the end of the month) by phone call to the office staff.   Patient agreed to services and verbal consent obtained.   Follow up plan:   Diana Bernard [employer] Scheduler

## 2020-07-25 ENCOUNTER — Telehealth: Payer: Self-pay

## 2020-07-25 NOTE — Telephone Encounter (Signed)
Completed medical records for [facility]  Mailed to [address], [place] [address]

## 2020-07-28 DIAGNOSIS — J449 Chronic obstructive pulmonary disease, unspecified: Secondary | ICD-10-CM | POA: Diagnosis not present

## 2020-07-28 DIAGNOSIS — I5020 Unspecified systolic (congestive) heart failure: Secondary | ICD-10-CM | POA: Diagnosis not present

## 2020-07-28 DIAGNOSIS — I739 Peripheral vascular disease, unspecified: Secondary | ICD-10-CM | POA: Diagnosis not present

## 2020-07-28 DIAGNOSIS — Z6841 Body Mass Index (BMI) 40.0 and over, adult: Secondary | ICD-10-CM | POA: Diagnosis not present

## 2020-07-28 DIAGNOSIS — E261 Secondary hyperaldosteronism: Secondary | ICD-10-CM | POA: Diagnosis not present

## 2020-07-28 DIAGNOSIS — H9202 Otalgia, left ear: Secondary | ICD-10-CM | POA: Diagnosis not present

## 2020-07-28 DIAGNOSIS — E662 Morbid (severe) obesity with alveolar hypoventilation: Secondary | ICD-10-CM | POA: Diagnosis not present

## 2020-07-29 NOTE — Progress Notes (Signed)
Chronic Care Management Pharmacy Note  08/03/2020 Name:  Diana Bernard MRN:  [id number] DOB:  06-23-1945  Subjective: Diana Bernard is an 75 y.o. year old female who is a primary patient of Diana Rolls Timoteo Gaul, MD.  The CCM team was consulted for assistance with disease management and care coordination needs.    Engaged with patient by telephone for initial visit in response to provider referral for pharmacy case management and/or care coordination services.   Consent to Services:  The patient was given the following information about Chronic Care Management services today, agreed to services, and gave verbal consent: 1. CCM service includes personalized support from designated clinical staff supervised by the primary care provider, including individualized plan of care and coordination with other care providers 2. 24/7 contact phone numbers for assistance for urgent and routine care needs. 3. Service will only be billed when office clinical staff spend 20 minutes or more in a month to coordinate care. 4. Only one practitioner may furnish and bill the service in a calendar month. 5.The patient may stop CCM services at any time (effective at the end of the month) by phone call to the office staff. 6. The patient will be responsible for cost sharing (co-pay) of up to 20% of the service fee (after annual deductible is met). Patient agreed to services and consent obtained.  Patient Care Team: Lavera Guise, MD as PCP - General (Internal Medicine) Edythe Clarity, [facility] as Pharmacist (Pharmacist)  Recent office visits: 06/17/20 Diana Bernard) - complains of chest congestion.  No changes to medication at this visit  05/06/20 Diana Bernard) - given samples of Trelegy in office.  If does not improve symptoms patient was to restart Advair. Recent consult visits: None recent  Hospital visits: None in previous 6 months  Objective:  Lab Results  Component Value Date   CREATININE 1.15 (H) 04/08/2020    BUN 18 04/08/2020   GFRNONAA 48 (L) 04/08/2020   GFRAA 55 (L) 04/08/2020   NA 140 04/08/2020   K 4.4 04/08/2020   CALCIUM 9.7 04/08/2020   CO2 25 04/08/2020   GLUCOSE 95 04/08/2020    Lab Results  Component Value Date/Time   HGBA1C 5.7 (H) 10/19/2019 10:13 AM   HGBA1C 5.6 04/13/2019 11:40 AM    Last diabetic Eye exam: No results found for: HMDIABEYEEXA  Last diabetic Foot exam: No results found for: HMDIABFOOTEX   No results found for: CHOL, HDL, LDLCALC, LDLDIRECT, TRIG, CHOLHDL  Hepatic Function Latest Ref Rng & Units 04/08/2020 04/13/2019 03/18/2018  Total Protein 6.0 - 8.5 g/dL 6.7 6.9 6.9  Albumin 3.7 - 4.7 g/dL 4.2 3.9 4.2  AST 0 - 40 IU/L 37 35 19  ALT 0 - 32 IU/L $Remov'30 26 15  'tqRqdv$ Alk Phosphatase 44 - 121 IU/L 50 79 73  Total Bilirubin 0.0 - 1.2 mg/dL 0.3 0.3 0.2    Lab Results  Component Value Date/Time   TSH 1.770 10/19/2019 10:13 AM   TSH 2.670 04/13/2019 11:40 AM   FREET4 0.94 10/19/2019 10:13 AM   FREET4 0.74 (L) 04/13/2019 11:40 AM    CBC Latest Ref Rng & Units 04/13/2019 03/18/2018 03/12/2016  WBC 3.4 - 10.8 x10E3/uL 3.9 4.2 4.2  Hemoglobin 11.1 - 15.9 g/dL 11.4 11.8 12.1  Hematocrit 34.0 - 46.6 % 33.6(L) 34.5 35.2  Platelets 150 - 450 x10E3/uL 124(L) 160 165    Lab Results  Component Value Date/Time   VD25OH 26.8 (L) 04/13/2019 11:40 AM   VD25OH 29.5 (L)  03/18/2018 02:04 PM    Clinical ASCVD: No  The 10-year ASCVD risk score Mikey Bussing DC Jr., et al., 2013) is: 17.7%   Values used to calculate the score:     Age: 51 years     Sex: Female     Is Non-Hispanic African American: No     Diabetic: No     Tobacco smoker: No     Systolic Blood Pressure: 258 mmHg     Is BP treated: Yes     HDL Cholesterol: 30 mg/dL     Total Cholesterol: 142 mg/dL    Depression screen [facility] 2/9 05/06/2020 04/04/2020 03/09/2020  Decreased Interest 0 0 0  Down, Depressed, Hopeless 0 0 0  PHQ - 2 Score 0 0 0  Altered sleeping - - -  Tired, decreased energy - - -  Change in  appetite - - -  Feeling bad or failure about yourself  - - -  Trouble concentrating - - -  Moving slowly or fidgety/restless - - -  Suicidal thoughts - - -  PHQ-9 Score - - -  Difficult doing work/chores - - -      Social History   Tobacco Use  Smoking Status Never Smoker  Smokeless Tobacco Never Used   BP Readings from Last 3 Encounters:  06/17/20 138/82  05/06/20 130/68  04/06/20 (!) 148/82   Pulse Readings from Last 3 Encounters:  06/17/20 90  05/06/20 70  04/06/20 87   Wt Readings from Last 3 Encounters:  06/17/20 276 lb (125.2 kg)  05/06/20 274 lb 12.8 oz (124.6 kg)  04/06/20 274 lb (124.3 kg)   BMI Readings from Last 3 Encounters:  06/17/20 53.90 kg/m  05/06/20 53.67 kg/m  04/06/20 50.12 kg/m    Assessment/Interventions: Review of patient past medical history, allergies, medications, health status, including review of consultants reports, laboratory and other test data, was performed as part of comprehensive evaluation and provision of chronic care management services.   SDOH:  (Social Determinants of Health) assessments and interventions performed: Yes   Financial Resource Strain: Low Risk   . Difficulty of Paying Living Expenses: Not very hard    SDOH Screenings   Alcohol Screen: Low Risk   . Last Alcohol Screening Score (AUDIT): 0  Depression (PHQ2-9): Low Risk   . PHQ-2 Score: 0  Financial Resource Strain: Low Risk   . Difficulty of Paying Living Expenses: Not very hard  Food Insecurity: Not on file  Housing: Not on file  Physical Activity: Not on file  Social Connections: Not on file  Stress: Not on file  Tobacco Use: Low Risk   . Smoking Tobacco Use: Never Smoker  . Smokeless Tobacco Use: Never Used  Transportation Needs: Not on file    [place]  No Known Allergies  Medications Reviewed Today    Reviewed by Edythe Clarity, [facility] (Pharmacist) on 08/03/20 at 35  Med List Status: <None>  Medication Order Taking? Sig  Documenting Provider Last Dose Status Informant  albuterol (VENTOLIN HFA) 108 (90 Base) MCG/ACT inhaler [id number] Yes Inhale 2 puffs into the lungs every 6 (six) hours as needed for wheezing or shortness of breath. Ronnell Freshwater, NP Taking Active   alendronate (FOSAMAX) 70 MG tablet [id number] Yes Take 1 tablet (70 mg total) by mouth every 7 (seven) days. Take with a full glass of water on an empty stomach. Ronnell Freshwater, NP Taking Active   cholecalciferol (VITAMIN D) 1000 units tablet [id number] Yes Take 1,000  Units by mouth daily. [provider] Taking Active   Cyanocobalamin (VITAMIN B-12 PO) [id number] Yes Take by mouth. Pt takes [place] gummies [provider] Taking Active   diclofenac Sodium (VOLTAREN) 1 % GEL [id number] Yes Apply topically 4 (four) times daily. [provider] Taking Active   diphenoxylate-atropine (LOMOTIL) 2.5-0.025 MG tablet [id number] Yes Take 1 tablet by mouth 4 (four) times daily as needed for diarrhea or loose stools. Ronnell Freshwater, NP Taking Active   docusate sodium (COLACE) 100 MG capsule [id number] Yes Take 200 mg by mouth daily.  [provider] Taking Active            Med Note Serena Colonel   Tue Jun 07, 2015 11:15 AM) Received from: [facility]  DULoxetine (CYMBALTA) 30 MG capsule [id number] Yes Take 1 capsule po bid Ronnell Freshwater, NP Taking Active   esomeprazole (NEXIUM) 20 MG capsule [id number] Yes Take 20 mg by mouth daily at 12 noon. [provider] Taking Active Self  Fluticasone-Salmeterol (ADVAIR) 250-50 MCG/DOSE AEPB [id number] Yes INHALE 1 PUFF TWICE DAILY Luiz Ochoa, NP Taking Active   furosemide (LASIX) 20 MG tablet [id number] Yes Take 1 tablet (20 mg total) by mouth daily. Ronnell Freshwater, NP Taking Active   gabapentin (NEURONTIN) 300 MG capsule [id number] Yes Take 2 cap twice a daily for neuropathy Ronnell Freshwater, NP Taking Active   ibuprofen (ADVIL) 800 MG tablet [id number]  Yes Take 1 tablet (800 mg total) by mouth 2 (two) times daily as needed. Reported on 06/22/2015 Ronnell Freshwater, NP Taking Active   ipratropium-albuterol (DUONEB) 0.5-2.5 (3) MG/3ML SOLN [id number] Yes Take 3 mLs by nebulization every 4 (four) hours as needed. Ronnell Freshwater, NP Taking Active   levothyroxine (SYNTHROID) 50 MCG tablet [id number] Yes Take 1 tablet (50 mcg total) by mouth daily. Ronnell Freshwater, NP Taking Active   lisinopril (ZESTRIL) 20 MG tablet [id number] Yes Take 1 tablet (20 mg total) by mouth daily. Lavera Guise, MD Taking Active   Magnesium 250 MG TABS [id number] Yes Take by mouth. [provider] Taking Active            Med Note Koleen Nimrod, RACHEL Carlean Jews   Wed Jun 22, 2015  3:46 PM) Reports taking once a day  metoprolol succinate (TOPROL-XL) 50 MG 24 hr tablet [id number] Yes Take 50 mg by mouth daily. Take with or immediately following a meal. [provider] Taking Active   Potassium 99 MG TABS [id number] Yes Take by mouth. [provider] Taking Active            Med Note Myriam Jacobson Jun 16, 2015 11:19 AM) Takes 2 tablets everyday.  rosuvastatin (CRESTOR) 10 MG tablet [id number] Yes Take 10 mg by mouth 2 (two) times a week. [provider] Taking Active   spironolactone (ALDACTONE) 25 MG tablet [id number] Yes  [provider] Taking Active           Patient Active Problem List   Diagnosis Date Noted  . Dyspnea on exertion 04/03/2020  . Hx of malignant neoplasm of uterine body 11/03/2019  . Left lower quadrant abdominal pain 10/23/2019  . Age-related osteoporosis without current pathological fracture 10/23/2019  . Encounter for screening mammogram for malignant neoplasm of breast 10/23/2019  . Acquired hypothyroidism 08/02/2019  . Other symptoms and signs involving the nervous system 04/06/2019  . Memory loss 04/06/2019  . Acute upper respiratory infection 01/27/2019  .  Exposure to COVID-19 virus 01/27/2019  .  Intermittent claudication ([place]) 11/01/2018  . Moderate major depression ([place]) 09/22/2018  . Peripheral arterial disease ([place]) 06/25/2018  . Diverticulitis of large intestine without perforation or abscess without bleeding 05/29/2018  . Other fatigue 03/20/2018  . Dysuria 03/20/2018  . Gastroenteritis, acute 01/26/2018  . Diarrhea 01/26/2018  . Nausea 01/26/2018  . Chronic systolic heart failure ([place]) 01/26/2018  . Cough variant asthma 12/07/2017  . Need for vaccination against Streptococcus pneumoniae using pneumococcal conjugate vaccine 13 12/07/2017  . AICD lead displacement 11/20/2017  . Cardiac resynchronization therapy defibrillator (CRT-D) in place 11/14/2017  . Dilated cardiomyopathy ([place]) 09/04/2017  . Left bundle branch block 09/04/2017  . OSA on CPAP 07/31/2017  . Abscess of female pelvis 05/07/2014  . Acute deep vein thrombosis (DVT) of femoral vein of left lower extremity ([place]) 03/29/2014  . Lumbago-sciatica due to displacement of lumbar intervertebral disc 12/28/2013  . Chronic bilateral low back pain with bilateral sciatica 12/28/2013  . Neuropathy due to chemotherapeutic drug ([place]) 11/02/2013  . Encounter for general adult medical examination with abnormal findings 06/01/2013  . Morbid obesity ([place]) 04/20/2013  . Essential hypertension 04/13/2013  . Malignant neoplasm of endometrium ([place]) 03/30/2013  . Shortness of breath 03/30/2013  . CMC arthritis, thumb, degenerative 01/02/2013    Immunization History  Administered Date(s) Administered  . Influenza, High Dose Seasonal PF 04/27/2018, 02/25/2019  . Influenza,inj,Quad PF,6+ Mos 03/30/2014  . Influenza-Unspecified 01/28/2017  . PFIZER(Purple Top)SARS-COV-2 Vaccination 06/24/2019, 07/15/2019, 03/23/2020    Conditions to be addressed/monitored:  HTN, Chronic Systolic HF, Hypothyroidism, Obesity, Depression,   Care Plan : General Pharmacy (Adult)  Updates made by Edythe Clarity, RPH since 08/03/2020 12:00 AM     Problem: HTN, Chronic Systolic HF, Hypothyroidism, Obesity, Depression,   Priority: High  Onset Date: 08/03/2020    Long-Range Goal: Patient-Specific Goal   Start Date: 08/03/2020  Expected End Date: 02/02/2021  This Visit's Progress: On track  Priority: High  Note:   Current Barriers:  . Unable to achieve control of pain level.   Pharmacist Clinical Goal(s):  [name] Patient will achieve adherence to monitoring guidelines and medication adherence to achieve therapeutic efficacy . maintain control of blood pressure as evidenced by home monitoring  . adhere to prescribed medication regimen as evidenced by fill dates and use of pill box through collaboration with PharmD and provider.   Interventions: . 1:1 collaboration with Lavera Guise, MD regarding development and update of comprehensive plan of care as evidenced by provider attestation and co-signature . Inter-disciplinary care team collaboration (see longitudinal plan of care) . Comprehensive medication review performed; medication list updated in electronic medical record  Hypertension (BP goal <140/90) -Controlled -Current treatment: . Lisinopril $RemoveBefo'20mg'DULKroCSMSf$  . Metoprolol XL $RemoveBefor'50mg'JgyOsAkHQFeq$  daily -Medications previously tried: spironolactone, HCTZ -Current home readings: 130/70 per patient recall -Current dietary habits: she is starting to watch her salt, now drinking protein shakes to replace her lack of protein intake as she does not cook meat much -Current exercise habits: minimal, gets SOB walking through house -Denies hypotensive/hypertensive symptoms -Educated on BP goals and benefits of medications for prevention of heart attack, stroke and kidney damage; Daily salt intake goal < 2300 mg; Importance of home blood pressure monitoring; -Counseled to monitor BP at home daily, document, and provide log at future appointments -Recommended to continue current medication Recommended to continue to watch salt intake  Heart Failure (Goal: manage  symptoms and prevent exacerbations) -Controlled -Last ejection fraction: 35-45% (Date: 2014) -HF  type: Systolic  -Current treatment: . Furosemide $RemoveBefo'20mg'mJhSZtTOYyC$  . Metoprolol XL $RemoveBefor'50mg'kHuZyhJafBCS$  daily - [place] Records -Medications previously tried: none noted  -Current home BP/HR readings: 130/70 P 60s -Current dietary habits: patient watching salt intake, she reports she has lost approx. 14 lbs recently -Current exercise habits: minimal mainly due to pain -Educated on Benefits of medications for managing symptoms and prolonging life Importance of weighing daily; if you gain more than 3 pounds in one day or 5 pounds in one week, call providers Proper diuretic administration and potassium supplementation -Recommended to continue current medication  Depression/Anxiety (Goal: Minimize symptoms) -Controlled -Current treatment: . Duloxetine $RemoveBefo'30mg'hSCVUUcUMmx$  daily -Medications previously tried/failed: fluoxetine -Educated on Benefits of medication for symptom control  -Patient only taking once daily due to nausea, she is tolerating this dose fine -Reports no symptoms of depression -Recommended to continue current medication  Obesity (Goal: Control weight/weight loss) -Controlled -Current treatment  . None noted -Medications previously tried: none noted -She reports a recent weight loss of about 14 lbs.  She is watching what she eats. -Reports pain and SOB from walking within the house.  -Counseled on diet and exercise extensively Counseled on acceptable weight loss per week.  Encouraged her to continue the good work with lifestyle mods.  Hypothyroidism (Goal: maintain TSH) -Controlled -Current treatment  . Levothyroxine 41mcg daily -Medications previously tried: none noted -Patient verbalized taking correctly.  Takes 30 minutes before other meds, food, or drink in the morning on empty stomach. -Most recent TSH well controlled.  -Recommended to continue current medication  Pain(Goal: minimize pain level) -Not  ideally controlled -Current treatment  . Gabapentin $RemoveBefo'300mg'pNtnNEmRgoA$  two tablets twice daily . IBU $Rem'800mg'whLD$  bid prn -Medications previously tried: none noted -Patient currently only taking IBU very occasionally -Reports pain is constantly 8/10 level.  -She does not like to take a lot of medication for pain. -Gabapentin helps mainly at night time when she wants to sleep. -Recommended to continue current medication Recommended she could also try Tylenol on days where she does not want to take IBU.  Health Maintenance -Vaccine gaps: PCV 13 -Educated on benefits of pneumonia vaccine and how she can go about getting it. -Patient agreeable to recommendation. -Plans to get it at pharmacy or in MD office next visit.   Patient Goals/Self-Care Activities . Patient will:  - take medications as prescribed check blood pressure daily, document, and provide at future appointments weigh daily, and contact provider if weight gain of 3 pounds in one day or 5 pounds in a week engage in dietary modifications by continuing to limit sodium intake.  Follow Up Plan: The care management team will reach out to the patient again over the next 90 days.        Medication Assistance: None required.  Patient affirms current coverage meets needs.  Patient's preferred pharmacy is:  [facility], [address] [place] [address] Phone: [phone] Fax: [phone]  Uses pill box? Yes Pt endorses 100% compliance  We discussed: Benefits of medication synchronization, packaging and delivery as well as enhanced pharmacist oversight with [employer]. Patient decided to: Continue current medication management strategy  Care Plan and Follow Up Patient Decision:  Patient agrees to Care Plan and Follow-up.  Plan: The care management team will reach out to the patient again over the next 90 days.  Beverly Milch, PharmD Clinical Pharmacist [facility] [phone]

## 2020-08-01 ENCOUNTER — Other Ambulatory Visit: Payer: Self-pay | Admitting: Internal Medicine

## 2020-08-02 ENCOUNTER — Telehealth: Payer: Self-pay | Admitting: Pharmacist

## 2020-08-02 NOTE — Progress Notes (Addendum)
Chronic Care Management Pharmacy Assistant   Name: Diana Bernard  MRN: [id number] DOB: [birth year]  Reason for Encounter: Initial Questions   Medications: Outpatient Encounter Medications as of 08/02/2020  Medication Sig Note   albuterol (VENTOLIN HFA) 108 (90 Base) MCG/ACT inhaler Inhale 2 puffs into the lungs every 6 (six) hours as needed for wheezing or shortness of breath.    alendronate (FOSAMAX) 70 MG tablet Take 1 tablet (70 mg total) by mouth every 7 (seven) days. Take with a full glass of water on an empty stomach.    azithromycin (ZITHROMAX) 250 MG tablet Take one tab po qd for 10 days    cholecalciferol (VITAMIN D) 1000 units tablet Take 1,000 Units by mouth daily.    Cyanocobalamin (VITAMIN B-12 PO) Take by mouth. Pt takes [place] gummies    diclofenac Sodium (VOLTAREN) 1 % GEL Apply topically 4 (four) times daily.    diphenoxylate-atropine (LOMOTIL) 2.5-0.025 MG tablet Take 1 tablet by mouth 4 (four) times daily as needed for diarrhea or loose stools.    docusate sodium (STOOL SOFTENER) 100 MG capsule Take 200 mg by mouth daily.  06/07/2015: Received from: [facility]   DULoxetine (CYMBALTA) 30 MG capsule Take 1 capsule po bid    esomeprazole (NEXIUM) 20 MG capsule Take 20 mg by mouth daily at 12 noon.    Fluticasone-Salmeterol (ADVAIR) 250-50 MCG/DOSE AEPB INHALE 1 PUFF TWICE DAILY    furosemide (LASIX) 20 MG tablet Take 1 tablet (20 mg total) by mouth daily.    gabapentin (NEURONTIN) 300 MG capsule Take 2 cap twice a daily for neuropathy    ibuprofen (ADVIL) 800 MG tablet Take 1 tablet (800 mg total) by mouth 2 (two) times daily as needed. Reported on 06/22/2015    ipratropium-albuterol (DUONEB) 0.5-2.5 (3) MG/3ML SOLN Take 3 mLs by nebulization every 4 (four) hours as needed.    levothyroxine (SYNTHROID) 50 MCG tablet Take 1 tablet (50 mcg total) by mouth daily.    lisinopril (ZESTRIL) 20 MG tablet Take 1 tablet (20 mg total) by mouth daily.    Magnesium 250  MG TABS Take by mouth. 06/22/2015: Reports taking once a day   Potassium 99 MG TABS Take by mouth. 06/16/2015: Takes 2 tablets everyday.   spironolactone (ALDACTONE) 25 MG tablet     No facility-administered encounter medications on file as of 08/02/2020.   Have you seen any other providers since your last visit? Patient stated no.  Any changes in your medications or health? Patient stated no.  Any side effects from any medications? Patient stated her breathing.  Do you have an symptoms or problems not managed by your medications? Patient stated her pain.   Any concerns about your health right now? Patient stated her shortness of breath.  Has your provider asked that you check blood pressure, blood sugar, or follow special diet at home? Patient stated no but she monitors her blood pressure on her own.  Do you get any type of exercise on a regular basis? Patient stated she does stretching exercises every other day.   Can you think of a goal you would like to reach for your health? Patient stated she would like to get off some of her medication and loose weight.   Do you have any problems getting your medications? Patient stated no.   Is there anything that you would like to discuss during the appointment? Patient stated no.   Please bring medications and supplements to appointment, patient  reminded.   Follow-Up:Pharmacist Review  Charlann Lange, RMA Clinical Pharmacist Assistant [phone]  5 minutes spent in review, coordination, and documentation.  Reviewed by: Beverly Milch, PharmD Clinical Pharmacist [facility] [phone]

## 2020-08-03 ENCOUNTER — Ambulatory Visit: Payer: Medicare HMO | Admitting: Pharmacist

## 2020-08-03 DIAGNOSIS — I5022 Chronic systolic (congestive) heart failure: Secondary | ICD-10-CM

## 2020-08-03 DIAGNOSIS — I10 Essential (primary) hypertension: Secondary | ICD-10-CM

## 2020-08-03 DIAGNOSIS — F321 Major depressive disorder, single episode, moderate: Secondary | ICD-10-CM

## 2020-08-03 NOTE — Patient Instructions (Addendum)
Visit Information  Goals Addressed            This Visit's Progress   . Track and Manage Fluids and Swelling-Heart Failure       Timeframe:  Long-Range Goal Priority:  High Start Date: 08/03/20                            Expected End Date:  02/02/21                     Follow Up Date 11/02/20   - call office if I gain more than 2 pounds in one day or 5 pounds in one week - keep legs up while sitting - meet with dietitian - use salt in moderation - watch for swelling in feet, ankles and legs every day    Why is this important?    It is important to check your weight daily and watch how much salt and liquids you have.   It will help you to manage your heart failure.    Notes:       Patient Care Plan: General Pharmacy (Adult)    Problem Identified: HTN, Chronic Systolic HF, Hypothyroidism, Obesity, Depression,   Priority: High  Onset Date: 08/03/2020    Long-Range Goal: Patient-Specific Goal   Start Date: 08/03/2020  Expected End Date: 02/02/2021  This Visit's Progress: On track  Priority: High  Note:   Current Barriers:  . Unable to achieve control of pain level.   Pharmacist Clinical Goal(s):  [name] Patient will achieve adherence to monitoring guidelines and medication adherence to achieve therapeutic efficacy . maintain control of blood pressure as evidenced by home monitoring  . adhere to prescribed medication regimen as evidenced by fill dates and use of pill box through collaboration with PharmD and provider.   Interventions: . 1:1 collaboration with Lavera Guise, MD regarding development and update of comprehensive plan of care as evidenced by provider attestation and co-signature . Inter-disciplinary care team collaboration (see longitudinal plan of care) . Comprehensive medication review performed; medication list updated in electronic medical record  Hypertension (BP goal <140/90) -Controlled -Current treatment: . Lisinopril 20mg  . Metoprolol XL 50mg   daily -Medications previously tried: spironolactone, HCTZ -Current home readings: 130/70 per patient recall -Current dietary habits: she is starting to watch her salt, now drinking protein shakes to replace her lack of protein intake as she does not cook meat much -Current exercise habits: minimal, gets SOB walking through house -Denies hypotensive/hypertensive symptoms -Educated on BP goals and benefits of medications for prevention of heart attack, stroke and kidney damage; Daily salt intake goal < 2300 mg; Importance of home blood pressure monitoring; -Counseled to monitor BP at home daily, document, and provide log at future appointments -Recommended to continue current medication Recommended to continue to watch salt intake  Heart Failure (Goal: manage symptoms and prevent exacerbations) -Controlled -Last ejection fraction: 35-45% (Date: 2014) -HF type: Systolic  -Current treatment: . Furosemide 20mg  . Metoprolol XL 50mg  daily - [facility] -Medications previously tried: none noted  -Current home BP/HR readings: 130/70 P 60s -Current dietary habits: patient watching salt intake, she reports she has lost approx. 14 lbs recently -Current exercise habits: minimal mainly due to pain -Educated on Benefits of medications for managing symptoms and prolonging life Importance of weighing daily; if you gain more than 3 pounds in one day or 5 pounds in one week, call providers Proper diuretic administration and potassium  supplementation -Recommended to continue current medication  Depression/Anxiety (Goal: Minimize symptoms) -Controlled -Current treatment: . Duloxetine 30mg  daily -Medications previously tried/failed: fluoxetine -Educated on Benefits of medication for symptom control  -Patient only taking once daily due to nausea, she is tolerating this dose fine -Reports no symptoms of depression -Recommended to continue current medication  Obesity (Goal: Control weight/weight  loss) -Controlled -Current treatment  . None noted -Medications previously tried: none noted -She reports a recent weight loss of about 14 lbs.  She is watching what she eats. -Reports pain and SOB from walking within the house.  -Counseled on diet and exercise extensively Counseled on acceptable weight loss per week.  Encouraged her to continue the good work with lifestyle mods.  Hypothyroidism (Goal: maintain TSH) -Controlled -Current treatment  . Levothyroxine 28mcg daily -Medications previously tried: none noted -Patient verbalized taking correctly.  Takes 30 minutes before other meds, food, or drink in the morning on empty stomach. -Most recent TSH well controlled.  -Recommended to continue current medication  Pain(Goal: minimize pain level) -Not ideally controlled -Current treatment  . Gabapentin 300mg  two tablets twice daily . IBU 800mg  bid prn -Medications previously tried: none noted -Patient currently only taking IBU very occasionally -Reports pain is constantly 8/10 level.  -She does not like to take a lot of medication for pain. -Gabapentin helps mainly at night time when she wants to sleep. -Recommended to continue current medication Recommended she could also try Tylenol on days where she does not want to take IBU.  Health Maintenance -Vaccine gaps: PCV 13 -Educated on benefits of pneumonia vaccine and how she can go about getting it. -Patient agreeable to recommendation. -Plans to get it at pharmacy or in MD office next visit.   Patient Goals/Self-Care Activities . Patient will:  - take medications as prescribed check blood pressure daily, document, and provide at future appointments weigh daily, and contact provider if weight gain of 3 pounds in one day or 5 pounds in a week engage in dietary modifications by continuing to limit sodium intake.  Follow Up Plan: The care management team will reach out to the patient again over the next 90 days.        Diana Bernard was given information about Chronic Care Management services today including:  1. CCM service includes personalized support from designated clinical staff supervised by her physician, including individualized plan of care and coordination with other care providers 2. 24/7 contact phone numbers for assistance for urgent and routine care needs. 3. Standard insurance, coinsurance, copays and deductibles apply for chronic care management only during months in which we provide at least 20 minutes of these services. Most insurances cover these services at 100%, however patients may be responsible for any copay, coinsurance and/or deductible if applicable. This service may help you avoid the need for more expensive face-to-face services. 4. Only one practitioner may furnish and bill the service in a calendar month. 5. The patient may stop CCM services at any time (effective at the end of the month) by phone call to the office staff.  Patient agreed to services and verbal consent obtained.   The patient verbalized understanding of instructions, educational materials, and care plan provided today and agreed to receive a mailed copy of patient instructions, educational materials, and care plan.  Telephone follow up appointment with pharmacy team member scheduled for: 3 months  Diana Bernard, [facility]  Heart Failure, Self-Care Heart failure is a serious condition. The following information explains things you need  to do to take care of yourself at home. To help you stay as healthy as possible, you may be asked to change your diet, take certain medicines, and make other changes in your life. Your doctor may also give you more specific instructions. If you have problems or questions, call your doctor. What are the risks? Having heart failure makes it more likely for you to have some problems. These problems can get worse if you do not take good care of yourself. Problems may include:  Damage to  the kidneys, liver, or lungs.  Malnutrition.  Abnormal heart rhythms.  Blood clotting problems that could cause a stroke. Supplies needed:  Scale for weighing yourself.  Blood pressure monitor.  Notebook.  Medicines. How to care for yourself when you have heart failure Medicines Take over-the-counter and prescription medicines only as told by your doctor. Take your medicines every day.  Do not stop taking your medicine unless your doctor tells you to do so.  Do not skip any medicines.  Get your prescriptions refilled before you run out of medicine. This is important.  Talk with your doctor if you cannot afford your medicines. Eating and drinking  Eat heart-healthy foods. Talk with a diet specialist (dietitian) to create an eating plan.  Limit salt (sodium) if told by your doctor. Ask your diet specialist to tell you which seasonings are healthy for your heart.  Cook in healthy ways instead of frying. Healthy ways of cooking include roasting, grilling, broiling, baking, poaching, steaming, and stir-frying.  Choose foods that: ? Have no trans fat. ? Are low in saturated fat and cholesterol.  Choose healthy foods, such as: ? Fresh or frozen fruits and vegetables. ? Fish. ? Low-fat (lean) meats. ? Legumes, such as beans, peas, and lentils. ? Fat-free or low-fat dairy products. ? Whole-grain foods. ? High-fiber foods.  Limit how much fluid you drink, if told by your doctor.   Alcohol use  Do not drink alcohol if: ? Your doctor tells you not to drink. ? Your heart was damaged by alcohol, or you have very bad heart failure. ? You are pregnant, may be pregnant, or are planning to become pregnant.  If you drink alcohol: ? Limit how much you have to:  0-1 drink a day for women.  0-2 drinks a day for men. ? Know how much alcohol is in your drink. In the [place] one drink equals one 12 oz bottle of beer (355 mL), one 5 oz glass of wine (148 mL), or one 1 oz glass of  hard liquor (44 mL). Lifestyle  Do not smoke or use any products that contain nicotine or tobacco. If you need help quitting, ask your doctor. ? Do not use nicotine gum or patches before talking to your doctor.  Do not use illegal drugs.  Lose weight if told by your doctor.  Do physical activity if told by your doctor. Talk to your doctor before you begin an exercise if: ? You are an older adult. ? You have very bad heart failure.  Learn to manage stress. If you need help, ask your doctor.  Get physical rehab (rehabilitation) to help you stay independent and to help with your quality of life.  Participate in a cardiac rehab program. This program helps you improve your health through exercise, education, and counseling.  Plan time to rest when you get tired.   Check weight and blood pressure  Weigh yourself every day. This will help you to know if  fluid is building up in your body. ? Weigh yourself every morning after you pee (urinate) and before you eat breakfast. ? Wear the same amount of clothing each time. ? Write down your daily weight. Give your record to your doctor.  Check and write down your blood pressure as told by your doctor.  Check your pulse as told by your doctor.   Dealing with very hot and very cold weather  If it is very hot: ? Avoid activities that take a lot of energy. ? Use air conditioning or fans, or find a cooler place. ? Avoid caffeine and alcohol. ? Wear clothing that is loose-fitting, lightweight, and light-colored.  If it is very cold: ? Avoid activities that take a lot of energy. ? Layer your clothes. ? Wear mittens or gloves, a hat, and a face covering when you go outside. ? Avoid alcohol. Follow these instructions at home:  Stay up to date with shots (vaccines). Get pneumococcal and flu (influenza) shots.  Keep all follow-up visits. Contact a doctor if:  You gain 2-3 lb (1-1.4 kg) in 24 hours or 5 lb (2.3 kg) in a week.  You have  increasing shortness of breath.  You cannot do your normal activities.  You get tired easily.  You cough a lot.  You do not feel like eating or feel like you may vomit (nauseous).  You have swelling in your hands, feet, ankles, or belly (abdomen).  You cannot sleep well because it is hard to breathe.  You feel like your heart is beating fast (palpitations).  You get dizzy when you stand up.  You feel depressed or sad. Get help right away if:  You have trouble breathing.  You or someone else notices a change in your behavior, such as having trouble staying awake.  You have chest pain or discomfort.  You pass out (faint). These symptoms may be an emergency. Get help right away. Call your local emergency services (911 in the [place]  Do not wait to see if the symptoms will go away.  Do not drive yourself to the hospital. Summary  Heart failure is a serious condition. To care for yourself, you may have to change your diet, take medicines, and make other lifestyle changes.  Take your medicines every day. Do not stop taking them unless your doctor tells you to do so.  Limit salt and eat heart-healthy foods.  Ask your doctor if you can drink alcohol. You may have to stop alcohol use if you have very bad heart failure.  Contact your doctor if you gain weight quickly or feel that your heart is beating too fast. Get help right away if you pass out or have chest pain or trouble breathing. This information is not intended to replace advice given to you by your health care provider. Make sure you discuss any questions you have with your health care provider. Document Revised: 11/07/2019 Document Reviewed: 11/07/2019 Elsevier Patient Education  [place].

## 2020-08-10 DIAGNOSIS — H26493 Other secondary cataract, bilateral: Secondary | ICD-10-CM | POA: Diagnosis not present

## 2020-08-10 DIAGNOSIS — H1045 Other chronic allergic conjunctivitis: Secondary | ICD-10-CM | POA: Diagnosis not present

## 2020-08-10 DIAGNOSIS — Z0100 Encounter for examination of eyes and vision without abnormal findings: Secondary | ICD-10-CM | POA: Diagnosis not present

## 2020-08-19 ENCOUNTER — Telehealth: Payer: Self-pay

## 2020-08-19 NOTE — Telephone Encounter (Signed)
Completed medical records for [facility]  Mailed to [address], [address] [place] [address] 

## 2020-08-26 ENCOUNTER — Telehealth: Payer: Self-pay | Admitting: Pharmacist

## 2020-08-26 NOTE — Progress Notes (Addendum)
    Chronic Care Management Pharmacy Assistant   Name: Diana Bernard  MRN: [id number] DOB: 20-Jun-1945  Reason for Encounter: Adherence Review Medications: Outpatient Encounter Medications as of 08/26/2020  Medication Sig Note   albuterol (VENTOLIN HFA) 108 (90 Base) MCG/ACT inhaler Inhale 2 puffs into the lungs every 6 (six) hours as needed for wheezing or shortness of breath.    alendronate (FOSAMAX) 70 MG tablet Take 1 tablet (70 mg total) by mouth every 7 (seven) days. Take with a full glass of water on an empty stomach.    cholecalciferol (VITAMIN D) 1000 units tablet Take 1,000 Units by mouth daily.    Cyanocobalamin (VITAMIN B-12 PO) Take by mouth. Pt takes [place] gummies    diclofenac Sodium (VOLTAREN) 1 % GEL Apply topically 4 (four) times daily.    diphenoxylate-atropine (LOMOTIL) 2.5-0.025 MG tablet Take 1 tablet by mouth 4 (four) times daily as needed for diarrhea or loose stools.    docusate sodium (COLACE) 100 MG capsule Take 200 mg by mouth daily.  06/07/2015: Received from: [facility]   DULoxetine (CYMBALTA) 30 MG capsule Take 1 capsule po bid    esomeprazole (NEXIUM) 20 MG capsule Take 20 mg by mouth daily at 12 noon.    Fluticasone-Salmeterol (ADVAIR) 250-50 MCG/DOSE AEPB INHALE 1 PUFF TWICE DAILY    furosemide (LASIX) 20 MG tablet Take 1 tablet (20 mg total) by mouth daily.    gabapentin (NEURONTIN) 300 MG capsule Take 2 cap twice a daily for neuropathy    ibuprofen (ADVIL) 800 MG tablet Take 1 tablet (800 mg total) by mouth 2 (two) times daily as needed. Reported on 06/22/2015    ipratropium-albuterol (DUONEB) 0.5-2.5 (3) MG/3ML SOLN Take 3 mLs by nebulization every 4 (four) hours as needed.    levothyroxine (SYNTHROID) 50 MCG tablet Take 1 tablet (50 mcg total) by mouth daily.    lisinopril (ZESTRIL) 20 MG tablet Take 1 tablet (20 mg total) by mouth daily.    Magnesium 250 MG TABS Take by mouth. 06/22/2015: Reports taking once a day   metoprolol succinate  (TOPROL-XL) 50 MG 24 hr tablet Take 50 mg by mouth daily. Take with or immediately following a meal.    Potassium 99 MG TABS Take by mouth. 06/16/2015: Takes 2 tablets everyday.   rosuvastatin (CRESTOR) 10 MG tablet Take 10 mg by mouth 2 (two) times a week.    spironolactone (ALDACTONE) 25 MG tablet     No facility-administered encounter medications on file as of 08/26/2020.    Reviewed the patients chart for any medical/health and/or medication changes, there were not any changes noted at this time. Patients last visit was with the pharmacist Leata Mouse on 08/03/20. Checked the patients star metric medications reviewing the fill dates of each one.    Follow-Up:Pharmacist Review  Charlann Lange, [place] Pharmacist Assistant [phone]

## 2020-08-27 DIAGNOSIS — I5022 Chronic systolic (congestive) heart failure: Secondary | ICD-10-CM

## 2020-08-27 DIAGNOSIS — E039 Hypothyroidism, unspecified: Secondary | ICD-10-CM

## 2020-08-27 DIAGNOSIS — I10 Essential (primary) hypertension: Secondary | ICD-10-CM

## 2020-09-13 DIAGNOSIS — Z6841 Body Mass Index (BMI) 40.0 and over, adult: Secondary | ICD-10-CM | POA: Diagnosis not present

## 2020-09-16 DIAGNOSIS — I429 Cardiomyopathy, unspecified: Secondary | ICD-10-CM | POA: Diagnosis not present

## 2020-09-16 DIAGNOSIS — Z4502 Encounter for adjustment and management of automatic implantable cardiac defibrillator: Secondary | ICD-10-CM | POA: Diagnosis not present

## 2020-09-21 ENCOUNTER — Other Ambulatory Visit: Payer: Self-pay | Admitting: Nurse Practitioner

## 2020-09-21 DIAGNOSIS — E039 Hypothyroidism, unspecified: Secondary | ICD-10-CM

## 2020-09-22 ENCOUNTER — Telehealth: Payer: Self-pay | Admitting: Pharmacist

## 2020-09-22 NOTE — Progress Notes (Addendum)
Chronic Care Management Pharmacy Assistant   Name: Diana Bernard  MRN: [id number] DOB: Sep 19, 1945  Reason for Encounter: General Disease State Call   Conditions to be addressed/monitored: HTN, Heart Failure, Obesity.  Recent office visits:  None since 08/26/20  Recent consult visits:  09/13/20 General Surgery Karrie Meres. No information given.  09/02/20 Cardiology Nydia Bouton, MD  For follow-up. Per note:referral placed for bariatric surgery  Hospital visits:  None since 08/26/20  Medications: Outpatient Encounter Medications as of 09/22/2020  Medication Sig Note   albuterol (VENTOLIN HFA) 108 (90 Base) MCG/ACT inhaler Inhale 2 puffs into the lungs every 6 (six) hours as needed for wheezing or shortness of breath.    alendronate (FOSAMAX) 70 MG tablet Take 1 tablet (70 mg total) by mouth every 7 (seven) days. Take with a full glass of water on an empty stomach.    cholecalciferol (VITAMIN D) 1000 units tablet Take 1,000 Units by mouth daily.    Cyanocobalamin (VITAMIN B-12 PO) Take by mouth. Pt takes [place] gummies    diclofenac Sodium (VOLTAREN) 1 % GEL Apply topically 4 (four) times daily.    diphenoxylate-atropine (LOMOTIL) 2.5-0.025 MG tablet Take 1 tablet by mouth 4 (four) times daily as needed for diarrhea or loose stools.    docusate sodium (COLACE) 100 MG capsule Take 200 mg by mouth daily.  06/07/2015: Received from: [facility]   DULoxetine (CYMBALTA) 30 MG capsule Take 1 capsule po bid    esomeprazole (NEXIUM) 20 MG capsule Take 20 mg by mouth daily at 12 noon.    Fluticasone-Salmeterol (ADVAIR) 250-50 MCG/DOSE AEPB INHALE 1 PUFF TWICE DAILY    furosemide (LASIX) 20 MG tablet Take 1 tablet (20 mg total) by mouth daily.    gabapentin (NEURONTIN) 300 MG capsule Take 2 cap twice a daily for neuropathy    ibuprofen (ADVIL) 800 MG tablet Take 1 tablet (800 mg total) by mouth 2 (two) times daily as needed. Reported on 06/22/2015     ipratropium-albuterol (DUONEB) 0.5-2.5 (3) MG/3ML SOLN Take 3 mLs by nebulization every 4 (four) hours as needed.    levothyroxine (SYNTHROID) 50 MCG tablet Take 1 tablet (50 mcg total) by mouth daily.    lisinopril (ZESTRIL) 20 MG tablet Take 1 tablet (20 mg total) by mouth daily.    Magnesium 250 MG TABS Take by mouth. 06/22/2015: Reports taking once a day   metoprolol succinate (TOPROL-XL) 50 MG 24 hr tablet Take 50 mg by mouth daily. Take with or immediately following a meal.    Potassium 99 MG TABS Take by mouth. 06/16/2015: Takes 2 tablets everyday.   rosuvastatin (CRESTOR) 10 MG tablet Take 10 mg by mouth 2 (two) times a week.    spironolactone (ALDACTONE) 25 MG tablet     No facility-administered encounter medications on file as of 09/22/2020.    GEN CALL: Patient stated her pain is still a level 10 all the time she stated she was told to loose weight but cant do much because her breathing. She stated her breathing seems to be worse. She stated she is using her Advair inhaler everyday abut it does not seem to be working any good. I suggested she start doing sitting/leg exercising because the patient stated she feels like her legs are extremely weak.  Star Rating Drugs: Gabapentin 300 mg 45 DS 06/16/20 Lisinopril 20 mg 90 DS 12/24/19 Rosuvastatin 10 mg 84 DS 06/26/20  Follow-Up:Pharmacist Review  Charlann Lange, RMA Clinical Pharmacist Assistant  [phone]  10 minutes spent in review, coordination, and documentation.  Reviewed by: Beverly Milch, PharmD Clinical Pharmacist [facility] [phone]

## 2020-09-23 ENCOUNTER — Other Ambulatory Visit: Payer: Self-pay

## 2020-09-23 ENCOUNTER — Telehealth: Payer: Self-pay

## 2020-09-23 MED ORDER — CIPROFLOXACIN HCL 500 MG PO TABS: 500.0000 mg | ORAL_TABLET | Freq: Two times a day (BID) | ORAL | 0 refills | Status: DC

## 2020-09-23 MED ORDER — METRONIDAZOLE 500 MG PO TABS: 500.0000 mg | ORAL_TABLET | Freq: Two times a day (BID) | ORAL | 0 refills | Status: AC

## 2020-09-23 NOTE — Telephone Encounter (Signed)
pt called stating she has diverticulitis and has been having issues for 2 weeks now diarrhea and constipation back and forth. she is asking if abx's can be sent in for it. has hx of surgery for stomach cancer 7 yrs ago.  Per Alyssa: I think she needs to be seen in the clinic or if she cannot wait until next week, she needs to go to urgent care. I do not know this patient and I do not feel comfortable prescribing antibiotics for an infection I cannot assess her for.  I called pt back and gave her information and I did schedule her appt for 09/27/20 at 10 am with DFK.  Told pt if it got worse to go to urgent care or hospital.

## 2020-09-24 ENCOUNTER — Other Ambulatory Visit: Payer: Self-pay | Admitting: Nurse Practitioner

## 2020-09-24 DIAGNOSIS — E039 Hypothyroidism, unspecified: Secondary | ICD-10-CM

## 2020-09-27 ENCOUNTER — Ambulatory Visit (INDEPENDENT_AMBULATORY_CARE_PROVIDER_SITE_OTHER): Payer: Medicare HMO | Admitting: Internal Medicine

## 2020-09-27 ENCOUNTER — Encounter: Payer: Self-pay | Admitting: Internal Medicine

## 2020-09-27 ENCOUNTER — Other Ambulatory Visit: Payer: Self-pay

## 2020-09-27 VITALS — BP 130/72 | HR 90 | Temp 97.5°F | Resp 16 | Ht 60.0 in | Wt 272.6 lb

## 2020-09-27 DIAGNOSIS — E039 Hypothyroidism, unspecified: Secondary | ICD-10-CM | POA: Diagnosis not present

## 2020-09-27 DIAGNOSIS — I10 Essential (primary) hypertension: Secondary | ICD-10-CM | POA: Diagnosis not present

## 2020-09-27 DIAGNOSIS — R0602 Shortness of breath: Secondary | ICD-10-CM

## 2020-09-27 DIAGNOSIS — R5381 Other malaise: Secondary | ICD-10-CM | POA: Diagnosis not present

## 2020-09-27 DIAGNOSIS — Z6841 Body Mass Index (BMI) 40.0 and over, adult: Secondary | ICD-10-CM | POA: Diagnosis not present

## 2020-09-27 DIAGNOSIS — K5792 Diverticulitis of intestine, part unspecified, without perforation or abscess without bleeding: Secondary | ICD-10-CM | POA: Diagnosis not present

## 2020-09-27 DIAGNOSIS — I5022 Chronic systolic (congestive) heart failure: Secondary | ICD-10-CM | POA: Diagnosis not present

## 2020-09-27 NOTE — Progress Notes (Signed)
[facility] [place], [place] [address]  Internal MEDICINE  Office Visit Note  Patient Name: Diana Bernard  [id number]  [id number]  Date of Service: 09/28/2020  Chief Complaint  Patient presents with  . Acute Visit    Possible diverticulitis, started 3 weeks ago, gas, hurts where abscess were, lower back pain, legs are weak, SOB, fatigued all the time   . Quality Metric Gaps    Mammogram, Shingrix,     HPI Patient is here for follow-up after being treated for acute diverticulitis she was started on Flagyl and Cipro 500 twice daily her symptoms have resolved. Patient is complaining of lower back pain and leg pain, she is complaining of weakness in her lower extremities and getting short of breath with minimal exertion.  Again she continues to have problems with her weight and asking for help to control her weight. She recently had added a shake as a replacement for dinner which ahs 20 g of protein in it.  However the other meals are mostly carbohydrates. She usually eats prepackaged meals does not cook at home however is willing to modify her diet    Current Medication: Outpatient Encounter Medications as of 09/27/2020  Medication Sig Note  . albuterol (VENTOLIN HFA) 108 (90 Base) MCG/ACT inhaler Inhale 2 puffs into the lungs every 6 (six) hours as needed for wheezing or shortness of breath.   [name] alendronate (FOSAMAX) 70 MG tablet Take 1 tablet (70 mg total) by mouth every 7 (seven) days. Take with a full glass of water on an empty stomach.   . cholecalciferol (VITAMIN D) 1000 units tablet Take 1,000 Units by mouth daily.   . ciprofloxacin (CIPRO) 500 MG tablet Take 1 tablet (500 mg total) by mouth 2 (two) times daily. For 7 days   . Cyanocobalamin (VITAMIN B-12 PO) Take by mouth. Pt takes [place] gummies   . diclofenac Sodium (VOLTAREN) 1 % GEL Apply topically 4 (four) times daily.   . diphenoxylate-atropine (LOMOTIL) 2.5-0.025 MG tablet Take 1  tablet by mouth 4 (four) times daily as needed for diarrhea or loose stools.   . docusate sodium (COLACE) 100 MG capsule Take 200 mg by mouth daily.  06/07/2015: Received from: [facility]  . DULoxetine (CYMBALTA) 30 MG capsule Take 1 capsule po bid   . esomeprazole (NEXIUM) 20 MG capsule Take 20 mg by mouth daily at 12 noon.   . Fluticasone-Salmeterol (ADVAIR) 250-50 MCG/DOSE AEPB INHALE 1 PUFF TWICE DAILY   . furosemide (LASIX) 20 MG tablet Take 1 tablet (20 mg total) by mouth daily.   [name] gabapentin (NEURONTIN) 300 MG capsule Take 2 cap twice a daily for neuropathy   . ibuprofen (ADVIL) 800 MG tablet Take 1 tablet (800 mg total) by mouth 2 (two) times daily as needed. Reported on 06/22/2015   . ipratropium-albuterol (DUONEB) 0.5-2.5 (3) MG/3ML SOLN Take 3 mLs by nebulization every 4 (four) hours as needed.   [name] levothyroxine (SYNTHROID) 50 MCG tablet Take 1 tablet (50 mcg total) by mouth daily.   [name] lisinopril (ZESTRIL) 20 MG tablet Take 1 tablet (20 mg total) by mouth daily.   . Magnesium 250 MG TABS Take by mouth. 06/22/2015: Reports taking once a day  . metoprolol succinate (TOPROL-XL) 50 MG 24 hr tablet Take 50 mg by mouth daily. Take with or immediately following a meal.   . metroNIDAZOLE (FLAGYL) 500 MG tablet Take 1 tablet (500 mg total) by mouth 2 (two) times daily  for 7 days.   . Potassium 99 MG TABS Take by mouth. 06/16/2015: Takes 2 tablets everyday.  . rosuvastatin (CRESTOR) 10 MG tablet Take 10 mg by mouth 2 (two) times a week.   . spironolactone (ALDACTONE) 25 MG tablet     No facility-administered encounter medications on file as of 09/27/2020.    Surgical History: Past Surgical History:  Procedure Laterality Date  . ABCESS DRAINAGE  2016   Abdominal abcess due to diverticulitis  . caridoverter defibrillator  11/14/2017   ICD  . CATARACT EXTRACTION W/PHACO Left 10/26/2015   Procedure: CATARACT EXTRACTION PHACO AND INTRAOCULAR LENS PLACEMENT (IOC) LEFT EYE;  Surgeon: Leandrew Koyanagi, MD;  Location: [place];  Service: Ophthalmology;  Laterality: Left;  . CATARACT EXTRACTION W/PHACO Right 12/07/2015   Procedure: CATARACT EXTRACTION PHACO AND INTRAOCULAR LENS PLACEMENT (IOC);  Surgeon: Leandrew Koyanagi, MD;  Location: [place];  Service: Ophthalmology;  Laterality: Right;  . CHOLECYSTECTOMY    . FOOT SURGERY     Per patient, bilateral foot surgery.  [name] PELVIC FRACTURE SURGERY     Per patient for cancer.    Medical History: Past Medical History:  Diagnosis Date  . Arthritis   . Asthma   . Cancer ([place])   . Deep vein thrombosis (DVT) ([place]) 2015  . Depression   . GERD (gastroesophageal reflux disease)   . Hypertension   . Weakness of both legs     Family History: Family History  Problem Relation Age of Onset  . Depression Sister   . Heart disease Sister   . Early death Sister   . Depression Sister   . Depression Sister   . Depression Sister   . Depression Sister     Social History   Socioeconomic History  . Marital status: Married    Spouse name: Not on file  . Number of children: Not on file  . Years of education: Not on file  . Highest education level: Not on file  Occupational History  . Not on file  Tobacco Use  . Smoking status: Never Smoker  . Smokeless tobacco: Never Used  Vaping Use  . Vaping Use: Never used  Substance and Sexual Activity  . Alcohol use: No  . Drug use: No  . Sexual activity: Not Currently  Other Topics Concern  . Not on file  Social History Narrative  . Not on file   Social Determinants of Health   Financial Resource Strain: Low Risk   . Difficulty of Paying Living Expenses: Not very hard  Food Insecurity: Not on file  Transportation Needs: Not on file  Physical Activity: Not on file  Stress: Not on file  Social Connections: Not on file  Intimate Partner Violence: Not on file      Review of Systems  Constitutional: Negative for chills, diaphoresis and fatigue.  HENT:  Negative for ear pain, postnasal drip and sinus pressure.   Eyes: Negative for photophobia, discharge, redness, itching and visual disturbance.  Respiratory: Positive for shortness of breath. Negative for cough and wheezing.   Cardiovascular: Negative for chest pain, palpitations and leg swelling.  Gastrointestinal: Negative for abdominal pain, constipation, diarrhea, nausea and vomiting.  Genitourinary: Negative for dysuria and flank pain.  Musculoskeletal: Negative for arthralgias, back pain, gait problem and neck pain.  Skin: Negative for color change.  Allergic/Immunologic: Negative for environmental allergies and food allergies.  Neurological: Negative for dizziness and headaches.  Hematological: Does not bruise/bleed easily.  Psychiatric/Behavioral: Negative for agitation,  behavioral problems (depression) and hallucinations.    Vital Signs: BP 130/72   Pulse 90   Temp (!) 97.5 F (36.4 C)   Resp 16   Ht 5' (1.524 m)   Wt 272 lb 9.6 oz (123.7 kg)   SpO2 97%   BMI 53.24 kg/m    Physical Exam Constitutional:      Appearance: Normal appearance.  HENT:     Head: Normocephalic and atraumatic.     Nose: Nose normal.     Mouth/Throat:     Mouth: Mucous membranes are moist.     Pharynx: No posterior oropharyngeal erythema.  Eyes:     Extraocular Movements: Extraocular movements intact.     Pupils: Pupils are equal, round, and reactive to light.  Cardiovascular:     Pulses: Normal pulses.     Heart sounds: Normal heart sounds.  Pulmonary:     Effort: Pulmonary effort is normal.     Breath sounds: Normal breath sounds.  Abdominal:     Palpations: Abdomen is soft.  Skin:    General: Skin is warm.  Neurological:     General: No focal deficit present.     Mental Status: She is alert.  Psychiatric:        Mood and Affect: Mood normal.        Behavior: Behavior normal.        Assessment/Plan: 1. Acute diverticulitis Symptoms are improving with Flagyl and Cipro she  will finish the antibiotic course might need GI if she has further flareups  2. Chronic systolic heart failure [facility]) Patient is followed by [facility] cardiology  3. BMI 50.0-59.9, adult (HCC) Obesity Counseling: Risk Assessment: An assessment of behavioral risk factors was made today and includes lack of exercise sedentary lifestyle, lack of portion control and poor dietary habits.  Risk Modification Advice: She was counseled on portion control guidelines. Restricting daily caloric intake to 1500. The detrimental long term effects of obesity on her health and ongoing poor compliance was also discussed with the patient.  Patient was given diet plan with high-protein diets - Metabolic Test  4. Physical deconditioning Patient is given a task of move more and sit less , increase activity as tolerated  General Counseling: Viveca verbalizes understanding of the findings of todays visit and agrees with plan of treatment. I have discussed any further diagnostic evaluation that may be needed or ordered today. We also reviewed her medications today. she has been encouraged to call the office with any questions or concerns that should arise related to todays visit.    Orders Placed This Encounter  Procedures  . Metabolic Test    No orders of the defined types were placed in this encounter.   Total time spent: 30 Minutes Time spent includes review of chart, medications, test results, and follow up plan with the patient.    Controlled Substance Database was reviewed by me.   Dr Lavera Guise Internal medicine

## 2020-09-29 ENCOUNTER — Ambulatory Visit: Payer: Medicare HMO | Admitting: Physician Assistant

## 2020-10-05 ENCOUNTER — Other Ambulatory Visit: Payer: Self-pay

## 2020-10-05 MED ORDER — FLUTICASONE-SALMETEROL 250-50 MCG/ACT IN AEPB: 1.0000 | INHALATION_SPRAY | Freq: Two times a day (BID) | RESPIRATORY_TRACT | 1 refills | Status: DC

## 2020-10-07 ENCOUNTER — Other Ambulatory Visit: Payer: Self-pay | Admitting: Nurse Practitioner

## 2020-10-11 DIAGNOSIS — R0982 Postnasal drip: Secondary | ICD-10-CM | POA: Diagnosis not present

## 2020-10-11 DIAGNOSIS — Z8719 Personal history of other diseases of the digestive system: Secondary | ICD-10-CM | POA: Diagnosis not present

## 2020-10-11 DIAGNOSIS — J3489 Other specified disorders of nose and nasal sinuses: Secondary | ICD-10-CM | POA: Diagnosis not present

## 2020-10-11 DIAGNOSIS — M2569 Stiffness of other specified joint, not elsewhere classified: Secondary | ICD-10-CM | POA: Diagnosis not present

## 2020-10-11 DIAGNOSIS — R0981 Nasal congestion: Secondary | ICD-10-CM | POA: Diagnosis not present

## 2020-10-11 DIAGNOSIS — R42 Dizziness and giddiness: Secondary | ICD-10-CM | POA: Diagnosis not present

## 2020-10-12 ENCOUNTER — Other Ambulatory Visit: Payer: Self-pay

## 2020-10-12 DIAGNOSIS — G4733 Obstructive sleep apnea (adult) (pediatric): Secondary | ICD-10-CM | POA: Diagnosis not present

## 2020-10-12 MED ORDER — GABAPENTIN 300 MG PO CAPS: ORAL_CAPSULE | ORAL | 1 refills | Status: DC

## 2020-10-19 DIAGNOSIS — Z8709 Personal history of other diseases of the respiratory system: Secondary | ICD-10-CM | POA: Diagnosis not present

## 2020-10-19 DIAGNOSIS — R519 Headache, unspecified: Secondary | ICD-10-CM | POA: Diagnosis not present

## 2020-10-19 DIAGNOSIS — M2569 Stiffness of other specified joint, not elsewhere classified: Secondary | ICD-10-CM | POA: Diagnosis not present

## 2020-10-19 DIAGNOSIS — Z8719 Personal history of other diseases of the digestive system: Secondary | ICD-10-CM | POA: Diagnosis not present

## 2020-10-24 ENCOUNTER — Encounter: Payer: Self-pay | Admitting: Internal Medicine

## 2020-10-24 ENCOUNTER — Other Ambulatory Visit: Payer: Self-pay

## 2020-10-24 ENCOUNTER — Ambulatory Visit (INDEPENDENT_AMBULATORY_CARE_PROVIDER_SITE_OTHER): Payer: Medicare HMO | Admitting: Internal Medicine

## 2020-10-24 VITALS — BP 158/78 | HR 70 | Temp 97.4°F | Resp 16 | Ht 60.0 in | Wt 271.0 lb

## 2020-10-24 DIAGNOSIS — E038 Other specified hypothyroidism: Secondary | ICD-10-CM

## 2020-10-24 DIAGNOSIS — Z0001 Encounter for general adult medical examination with abnormal findings: Secondary | ICD-10-CM

## 2020-10-24 DIAGNOSIS — N1831 Chronic kidney disease, stage 3a: Secondary | ICD-10-CM

## 2020-10-24 DIAGNOSIS — R300 Dysuria: Secondary | ICD-10-CM

## 2020-10-24 DIAGNOSIS — M5442 Lumbago with sciatica, left side: Secondary | ICD-10-CM

## 2020-10-24 DIAGNOSIS — M5441 Lumbago with sciatica, right side: Secondary | ICD-10-CM

## 2020-10-24 DIAGNOSIS — Z6841 Body Mass Index (BMI) 40.0 and over, adult: Secondary | ICD-10-CM | POA: Diagnosis not present

## 2020-10-24 DIAGNOSIS — R7301 Impaired fasting glucose: Secondary | ICD-10-CM

## 2020-10-24 DIAGNOSIS — Z1231 Encounter for screening mammogram for malignant neoplasm of breast: Secondary | ICD-10-CM

## 2020-10-24 DIAGNOSIS — I5022 Chronic systolic (congestive) heart failure: Secondary | ICD-10-CM

## 2020-10-24 DIAGNOSIS — G8929 Other chronic pain: Secondary | ICD-10-CM

## 2020-10-24 LAB — GLUCOSE, POCT (MANUAL RESULT ENTRY): POC Glucose: 103 mg/dl — AB (ref 70–99)

## 2020-10-24 MED ORDER — TRAMADOL HCL 50 MG PO TABS: 50.0000 mg | ORAL_TABLET | Freq: Four times a day (QID) | ORAL | 0 refills | Status: DC | PRN

## 2020-10-24 MED ORDER — GABAPENTIN 100 MG PO CAPS: ORAL_CAPSULE | ORAL | 3 refills | Status: DC

## 2020-10-24 NOTE — Progress Notes (Signed)
[facility] [place], [place] [address]  Internal MEDICINE  Office Visit Note  Patient Name: Diana Bernard  [id number]  [id number]  Date of Service: 10/31/2020  Chief Complaint  Patient presents with   Depression   Gastroesophageal Reflux   Hypertension   Asthma   Medicare Wellness    Cant turn neck, ears feel full, and throat sore sometimes, vision seems smoky, noticed all this about 4 weeks ago   Quality Metric Gaps    Shingrix, mammogram     HPI Pt is here for routine health maintenance examination Patient is complaining of neck pain, sore throat, allergies?? Patient is also complaining of lower back pain and leg pain, she is complaining of weakness in her lower extremities and getting short of breath with minimal exertion.  Again she continues to have problems with her weight and asking for help to control her weight. She recently had added a shake as a replacement for dinner which ahs 20 g of protein in it.  However the other meals are mostly carbohydrates. She usually eats prepackaged meals does not cook at home however is willing to modify her diet Followed by cardiology for chronic diastolic heart failure seems to be doing well. Main concern at this time is her inability to move due to her back pain and leg pain she is working hard to monitor her calories and to increase her protein intake Current Medication: Outpatient Encounter Medications as of 10/24/2020  Medication Sig Note   albuterol (VENTOLIN HFA) 108 (90 Base) MCG/ACT inhaler Inhale 2 puffs into the lungs every 6 (six) hours as needed for wheezing or shortness of breath.    alendronate (FOSAMAX) 70 MG tablet Take 1 tablet (70 mg total) by mouth every 7 (seven) days. Take with a full glass of water on an empty stomach.    cholecalciferol (VITAMIN D) 1000 units tablet Take 1,000 Units by mouth daily.    ciprofloxacin (CIPRO) 500 MG tablet Take 1 tablet (500 mg total) by mouth 2 (two)  times daily. For 7 days    Cyanocobalamin (VITAMIN B-12 PO) Take by mouth. Pt takes [place] gummies    diclofenac Sodium (VOLTAREN) 1 % GEL Apply topically 4 (four) times daily.    diphenoxylate-atropine (LOMOTIL) 2.5-0.025 MG tablet Take 1 tablet by mouth 4 (four) times daily as needed for diarrhea or loose stools.    docusate sodium (COLACE) 100 MG capsule Take 200 mg by mouth daily.  06/07/2015: Received from: [facility]   DULoxetine (CYMBALTA) 30 MG capsule Take 1 capsule po bid    esomeprazole (NEXIUM) 20 MG capsule Take 20 mg by mouth daily at 12 noon.    fluticasone-salmeterol (ADVAIR) 250-50 MCG/ACT AEPB Inhale 1 puff into the lungs in the morning and at bedtime.    furosemide (LASIX) 20 MG tablet Take 1 tablet (20 mg total) by mouth daily.    gabapentin (NEURONTIN) 100 MG capsule Take 2 tabs 2 x day    ibuprofen (ADVIL) 800 MG tablet Take 1 tablet (800 mg total) by mouth 2 (two) times daily as needed. Reported on 06/22/2015    ipratropium-albuterol (DUONEB) 0.5-2.5 (3) MG/3ML SOLN Take 3 mLs by nebulization every 4 (four) hours as needed.    levothyroxine (SYNTHROID) 50 MCG tablet Take 1 tablet (50 mcg total) by mouth daily.    lisinopril (ZESTRIL) 20 MG tablet Take 1 tablet (20 mg total) by mouth daily.    Magnesium 250 MG TABS Take by  mouth. 06/22/2015: Reports taking once a day   metoprolol succinate (TOPROL-XL) 50 MG 24 hr tablet Take 50 mg by mouth daily. Take with or immediately following a meal.    Potassium 99 MG TABS Take by mouth. 06/16/2015: Takes 2 tablets everyday.   rosuvastatin (CRESTOR) 10 MG tablet Take 10 mg by mouth 2 (two) times a week.    spironolactone (ALDACTONE) 25 MG tablet     [DISCONTINUED] gabapentin (NEURONTIN) 300 MG capsule Take 2 cap twice a daily for neuropathy    [DISCONTINUED] traMADol (ULTRAM) 50 MG tablet Take 1 tablet (50 mg total) by mouth every 6 (six) hours as needed for up to 5 days.    [EXPIRED] traMADol (ULTRAM) 50 MG tablet Take 1  tablet (50 mg total) by mouth every 6 (six) hours as needed for up to 5 days.    No facility-administered encounter medications on file as of 10/24/2020.    Surgical History: Past Surgical History:  Procedure Laterality Date   ABCESS DRAINAGE  2016   Abdominal abcess due to diverticulitis   caridoverter defibrillator  11/14/2017   ICD   CATARACT EXTRACTION W/PHACO Left 10/26/2015   Procedure: CATARACT EXTRACTION PHACO AND INTRAOCULAR LENS PLACEMENT (IOC) LEFT EYE;  Surgeon: Leandrew Koyanagi, MD;  Location: [place];  Service: Ophthalmology;  Laterality: Left;   CATARACT EXTRACTION W/PHACO Right 12/07/2015   Procedure: CATARACT EXTRACTION PHACO AND INTRAOCULAR LENS PLACEMENT (IOC);  Surgeon: Leandrew Koyanagi, MD;  Location: [place];  Service: Ophthalmology;  Laterality: Right;   CHOLECYSTECTOMY     FOOT SURGERY     Per patient, bilateral foot surgery.   PELVIC FRACTURE SURGERY     Per patient for cancer.    Medical History: Past Medical History:  Diagnosis Date   Arthritis    Asthma    Cancer ([place])    Deep vein thrombosis (DVT) ([place]) 2015   Depression    GERD (gastroesophageal reflux disease)    Hypertension    Weakness of both legs     Family History: Family History  Problem Relation Age of Onset   Depression Sister    Heart disease Sister    Early death Sister    Depression Sister    Depression Sister    Depression Sister    Depression Sister     Social History: Social History   Socioeconomic History   Marital status: Married    Spouse name: Not on file   Number of children: Not on file   Years of education: Not on file   Highest education level: Not on file  Occupational History   Not on file  Tobacco Use   Smoking status: Never   Smokeless tobacco: Never  Vaping Use   Vaping Use: Never used  Substance and Sexual Activity   Alcohol use: No   Drug use: No   Sexual activity: Not Currently  Other Topics Concern   Not on file   Social History Narrative   Not on file   Social Determinants of Health   Financial Resource Strain: Low Risk    Difficulty of Paying Living Expenses: Not very hard  Food Insecurity: Not on file  Transportation Needs: Not on file  Physical Activity: Not on file  Stress: Not on file  Social Connections: Not on file      Review of Systems  Constitutional:  Negative for chills, fatigue and unexpected weight change.  HENT:  Positive for postnasal drip and sore throat. Negative for congestion, rhinorrhea  and sneezing.   Eyes:  Negative for redness.  Respiratory:  Negative for cough, chest tightness and shortness of breath.   Cardiovascular:  Negative for chest pain and palpitations.  Gastrointestinal:  Negative for abdominal pain, constipation, diarrhea, nausea and vomiting.  Genitourinary:  Negative for dysuria and frequency.  Musculoskeletal:  Positive for arthralgias and back pain. Negative for joint swelling and neck pain.  Skin:  Negative for rash.  Neurological: Negative.  Negative for tremors and numbness.  Hematological:  Negative for adenopathy. Does not bruise/bleed easily.  Psychiatric/Behavioral:  Negative for behavioral problems (Depression), sleep disturbance and suicidal ideas. The patient is not nervous/anxious.     Vital Signs: BP (!) 158/78   Pulse 70   Temp (!) 97.4 F (36.3 C)   Resp 16   Ht 5' (1.524 m)   Wt 271 lb (122.9 kg)   SpO2 96%   BMI 52.93 kg/m    Physical Exam Constitutional:      Appearance: Normal appearance.  HENT:     Head: Normocephalic and atraumatic.     Nose: Nose normal.     Mouth/Throat:     Mouth: Mucous membranes are moist.     Pharynx: No posterior oropharyngeal erythema.  Eyes:     Extraocular Movements: Extraocular movements intact.     Pupils: Pupils are equal, round, and reactive to light.  Cardiovascular:     Rate and Rhythm: Normal rate and regular rhythm.     Pulses: Normal pulses.     Heart sounds: Normal heart  sounds.  Pulmonary:     Effort: Pulmonary effort is normal.     Breath sounds: Normal breath sounds.  Neurological:     General: No focal deficit present.     Mental Status: She is alert.  Psychiatric:        Mood and Affect: Mood normal.        Behavior: Behavior normal.      Assessment/Plan: 1. Encounter for general adult medical examination with abnormal findings All age appropriate lab and diagnostic is updated today  2. Chronic systolic heart failure [facility]) Patient is followed by [facility] cardiology  3. Breast cancer screening by mammogram Update mammogram order sent - MM DIGITAL SCREENING BILATERAL; Future  4. Other specified hypothyroidism Update labs - TSH + free T4  5. Chronic kidney disease, stage 3a ([place]) Will continue to monitor  - Basic Metabolic Panel (BMET)  6. Impaired fasting glucose Pt shown instructions on how to inject trulicity.  Pt understood the instructions and was given 2 sample boxes of Trulicity 8.27 mg - POCT Glucose (CBG) 103  7. Dysuria - UA/M w/rflx Culture, Routine   w/rflx Culture, Routine  8. BMI 50.0-59.9, adult (HCC) Obesity Counseling: Risk Assessment: An assessment of behavioral risk factors was made today and includes lack of exercise sedentary lifestyle, lack of portion control and poor dietary habits.  Risk Modification Advice: She was counseled on portion control guidelines. Restricting daily caloric intake to 1500. The detrimental long term effects of obesity on her health and ongoing poor compliance was also discussed with the patient.   9. Chronic bilateral low back pain with bilateral sciatica  [place] Controlled Substance Database was reviewed by me.Will add tramadol for control of pain and better mobility   General Counseling: Oliviarose verbalizes understanding of the findings of todays visit and agrees with plan of treatment. I have discussed any further diagnostic evaluation that may be needed or ordered today. We also reviewed  her medications  today. she has been encouraged to call the office with any questions or concerns that should arise related to todays visit. Visit involved complex medical problems and high level of critical thinking and complex decision making Orders Placed This Encounter  Procedures   Microscopic Examination   MM DIGITAL SCREENING BILATERAL   UA/M w/rflx Culture, Routine   Basic Metabolic Panel (BMET)   TSH + free T4   POCT Glucose (CBG)    Meds ordered this encounter  Medications   DISCONTD: traMADol (ULTRAM) 50 MG tablet    Sig: Take 1 tablet (50 mg total) by mouth every 6 (six) hours as needed for up to 5 days.    Dispense:  15 tablet    Refill:  0   gabapentin (NEURONTIN) 100 MG capsule    Sig: Take 2 tabs 2 x day    Dispense:  180 capsule    Refill:  3   traMADol (ULTRAM) 50 MG tablet    Sig: Take 1 tablet (50 mg total) by mouth every 6 (six) hours as needed for up to 5 days.    Dispense:  15 tablet    Refill:  0    Total time spent:50 Minutes  Time spent includes review of chart, medications, test results, and follow up plan with the patient.     Lavera Guise, MD  Internal Medicine

## 2020-10-24 NOTE — Progress Notes (Signed)
Pt shown instructions on how to inject trulicity.  Pt understood the instructions and was given 2 sample boxes of Trulicity 8.30 mg.

## 2020-10-25 LAB — UA/M W/RFLX CULTURE, ROUTINE
Bilirubin, UA: NEGATIVE
Glucose, UA: NEGATIVE
Ketones, UA: NEGATIVE
Leukocytes,UA: NEGATIVE
Nitrite, UA: NEGATIVE
Protein,UA: NEGATIVE
RBC, UA: NEGATIVE
Specific Gravity, UA: 1.01 (ref 1.005–1.030)
Urobilinogen, Ur: 0.2 mg/dL (ref 0.2–1.0)
pH, UA: 6.5 (ref 5.0–7.5)

## 2020-10-25 LAB — MICROSCOPIC EXAMINATION
Bacteria, UA: NONE SEEN
Casts: NONE SEEN /lpf
RBC, Urine: NONE SEEN /hpf (ref 0–2)
WBC, UA: NONE SEEN /hpf (ref 0–5)

## 2020-10-25 MED ORDER — TRAMADOL HCL 50 MG PO TABS: 50.0000 mg | ORAL_TABLET | Freq: Four times a day (QID) | ORAL | 0 refills | Status: AC | PRN

## 2020-11-03 ENCOUNTER — Telehealth: Payer: Self-pay

## 2020-11-09 ENCOUNTER — Other Ambulatory Visit: Payer: Self-pay

## 2020-11-09 DIAGNOSIS — M5442 Lumbago with sciatica, left side: Secondary | ICD-10-CM

## 2020-11-09 DIAGNOSIS — G8929 Other chronic pain: Secondary | ICD-10-CM

## 2020-11-09 DIAGNOSIS — I5022 Chronic systolic (congestive) heart failure: Secondary | ICD-10-CM

## 2020-11-09 DIAGNOSIS — Z1231 Encounter for screening mammogram for malignant neoplasm of breast: Secondary | ICD-10-CM

## 2020-11-09 DIAGNOSIS — E038 Other specified hypothyroidism: Secondary | ICD-10-CM

## 2020-11-09 DIAGNOSIS — Z6841 Body Mass Index (BMI) 40.0 and over, adult: Secondary | ICD-10-CM

## 2020-11-09 DIAGNOSIS — R7301 Impaired fasting glucose: Secondary | ICD-10-CM

## 2020-11-09 DIAGNOSIS — Z0001 Encounter for general adult medical examination with abnormal findings: Secondary | ICD-10-CM

## 2020-11-09 DIAGNOSIS — N1831 Chronic kidney disease, stage 3a: Secondary | ICD-10-CM

## 2020-11-09 DIAGNOSIS — R300 Dysuria: Secondary | ICD-10-CM

## 2020-11-09 MED ORDER — GABAPENTIN 100 MG PO CAPS: ORAL_CAPSULE | ORAL | 3 refills | Status: DC

## 2020-11-22 ENCOUNTER — Telehealth: Payer: Self-pay

## 2020-11-23 ENCOUNTER — Ambulatory Visit: Payer: Self-pay | Admitting: Pharmacist

## 2020-11-23 NOTE — Patient Instructions (Addendum)
Visit Information   Goals Addressed             This Visit's Progress    Achieve a Healthy Weight-Obesity       Timeframe:  Long-Range Goal Priority:  High Start Date:   11/23/20                        /Expected End Date: 05/26/21                      Follow Up Date 02/27/21    - manage portion size - set a realistic goal - set goal weight    Why is this important?   When you are ready to manage your weight, have a plan and have set a goal, it is time to take action.  Taking small steps to change how you eat and exercise is a good place to start.    Notes:        Patient Care Plan: General Pharmacy (Adult)     Problem Identified: HTN, Chronic Systolic HF, Hypothyroidism, Obesity, Depression,   Priority: High  Onset Date: 08/03/2020     Long-Range Goal: Patient-Specific Goal   Start Date: 08/03/2020  Expected End Date: 02/02/2021  Recent Progress: On track  Priority: High  Note:   Current Barriers:  Unable to achieve control of pain level.   Pharmacist Clinical Goal(s):  Patient will achieve adherence to monitoring guidelines and medication adherence to achieve therapeutic efficacy maintain control of blood pressure as evidenced by home monitoring  adhere to prescribed medication regimen as evidenced by fill dates and use of pill box through collaboration with PharmD and provider.   Interventions: 1:1 collaboration with Lavera Guise, MD regarding development and update of comprehensive plan of care as evidenced by provider attestation and co-signature Inter-disciplinary care team collaboration (see longitudinal plan of care) Comprehensive medication review performed; medication list updated in electronic medical record  Hypertension (BP goal <140/90) -Controlled -Current treatment: Lisinopril '20mg'$  Metoprolol XL '50mg'$  daily -Medications previously tried: spironolactone, HCTZ -Current home readings: 130/70 per patient recall -Current dietary habits: she is  starting to watch her salt, now drinking protein shakes to replace her lack of protein intake as she does not cook meat much -Current exercise habits: minimal, gets SOB walking through house -Denies hypotensive/hypertensive symptoms -Educated on BP goals and benefits of medications for prevention of heart attack, stroke and kidney damage; Daily salt intake goal < 2300 mg; Importance of home blood pressure monitoring; -Counseled to monitor BP at home daily, document, and provide log at future appointments -Recommended to continue current medication Recommended to continue to watch salt intake  Heart Failure (Goal: manage symptoms and prevent exacerbations) -Controlled -Last ejection fraction: 35-45% (Date: 2014) -HF type: Systolic  -Current treatment: Furosemide '20mg'$  Metoprolol XL '50mg'$  daily - [facility] -Medications previously tried: none noted  -Current home BP/HR readings: 130/70 P 60s -Current dietary habits: patient watching salt intake, she reports she has lost approx. 14 lbs recently -Current exercise habits: minimal mainly due to pain -Educated on Benefits of medications for managing symptoms and prolonging life Importance of weighing daily; if you gain more than 3 pounds in one day or 5 pounds in one week, call providers Proper diuretic administration and potassium supplementation -Recommended to continue current medication  Depression/Anxiety (Goal: Minimize symptoms) -Controlled -Current treatment: Duloxetine '30mg'$  daily -Medications previously tried/failed: fluoxetine -Educated on Benefits of medication for symptom control  -Patient only taking  once daily due to nausea, she is tolerating this dose fine -Reports no symptoms of depression -Recommended to continue current medication  Obesity (Goal: Control weight/weight loss) -Controlled -Current treatment  None noted -Medications previously tried: none noted -She reports a recent weight loss of about 14 lbs.  She  is watching what she eats. -Reports pain and SOB from walking within the house.  -Counseled on diet and exercise extensively Counseled on acceptable weight loss per week.  Encouraged her to continue the good work with lifestyle mods.  Update 11/23/20 Recently started on Trulicity samples about one month ago. She is tolerating this well and is finished with them - reports about 4-5 lbs. Of weight loss in a month.  Denies any GI issues. Plan was originally to switch to tablet form after she completed this, however she does not mind the shots. Would recommend seeing which one is covered best on insurance and move forward with that GLP-1.  Encouraged her to reach out if cost was an issue so we could look at PAP options. Encouraged her to continue the good progress.  Hypothyroidism (Goal: maintain TSH) -Controlled -Current treatment  Levothyroxine 21mg daily -Medications previously tried: none noted -Patient verbalized taking correctly.  Takes 30 minutes before other meds, food, or drink in the morning on empty stomach. -Most recent TSH well controlled.  -Recommended to continue current medication  Pain(Goal: minimize pain level) -Not ideally controlled -Current treatment  Gabapentin '100mg'$  two tablets twice daily IBU '800mg'$  bid prn Tramadol '50mg'$  q6h prn -Medications previously tried: none noted -Patient currently only taking IBU very occasionally -Reports pain is constantly 8/10 level.  -She does not like to take a lot of medication for pain. -Gabapentin helps mainly at night time when she wants to sleep. -Recommended to continue current medication Recommended she could also try Tylenol on days where she does not want to take IBU.  Update 11/23/20 Recently started on tramadol. Pain level 5/10. She was able to mop the kitchen yesterday!! She is in much better spirits today in regards to her pain. Was some confusion on gabapentin '100mg'$  vs. '300mg'$  - she ran into an issue at her  pharmacy.  Discussed the new updated Rx was for '100mg'$  two tablets twice daily - patient understands. Only using tramadol once daily and infrequently. She is also doing exercises laying on her bed to help her back pain. Continue current meds!   Health Maintenance -Vaccine gaps: PCV 13 -Educated on benefits of pneumonia vaccine and how she can go about getting it. -Patient agreeable to recommendation. -Plans to get it at pharmacy or in MD office next visit.   Patient Goals/Self-Care Activities Patient will:  - take medications as prescribed check blood pressure daily, document, and provide at future appointments weigh daily, and contact provider if weight gain of 3 pounds in one day or 5 pounds in a week engage in dietary modifications by continuing to limit sodium intake.  Follow Up Plan: The care management team will reach out to the patient again over the next 120 days.       Patient verbalizes understanding of instructions provided today and [place]  Telephone follow up appointment with pharmacy team member scheduled for: 4 months  [place]

## 2020-11-23 NOTE — Progress Notes (Signed)
Chronic Care Management Pharmacy Note  11/23/2020 Name:  Diana Bernard MRN:  [id number] DOB:  1945-08-22  Subjective: Diana Bernard is an 75 y.o. year old female who is a primary patient of Humphrey Rolls Timoteo Gaul, MD.  The CCM team was consulted for assistance with disease management and care coordination needs.    Engaged with patient by telephone for follow up visit in response to provider referral for pharmacy case management and/or care coordination services.   Consent to Services:  The patient was given the following information about Chronic Care Management services today, agreed to services, and gave verbal consent: 1. CCM service includes personalized support from designated clinical staff supervised by the primary care provider, including individualized plan of care and coordination with other care providers 2. 24/7 contact phone numbers for assistance for urgent and routine care needs. 3. Service will only be billed when office clinical staff spend 20 minutes or more in a month to coordinate care. 4. Only one practitioner may furnish and bill the service in a calendar month. 5.The patient may stop CCM services at any time (effective at the end of the month) by phone call to the office staff. 6. The patient will be responsible for cost sharing (co-pay) of up to 20% of the service fee (after annual deductible is met). Patient agreed to services and consent obtained.  Patient Care Team: Lavera Guise, MD as PCP - General (Internal Medicine) Edythe Clarity, [facility] as Pharmacist (Pharmacist)  Recent office visits: 10/24/20 Humphrey Rolls) - Started on Trulicity for weight loss aid, also given tramadol to better manage back pain and imporve mobility.  09/27/20 Humphrey Rolls) - follow up for acute diverticulitis, symptoms are improving on ABX - advised on restricting dietary intake to 1500 calories daily.  Recent consult visits: None recent  Hospital visits: None in previous 6  months  Objective:  Lab Results  Component Value Date   CREATININE 1.15 (H) 04/08/2020   BUN 18 04/08/2020   GFRNONAA 48 (L) 04/08/2020   GFRAA 55 (L) 04/08/2020   NA 140 04/08/2020   K 4.4 04/08/2020   CALCIUM 9.7 04/08/2020   CO2 25 04/08/2020   GLUCOSE 95 04/08/2020    Lab Results  Component Value Date/Time   HGBA1C 5.7 (H) 10/19/2019 10:13 AM   HGBA1C 5.6 04/13/2019 11:40 AM    Last diabetic Eye exam: No results found for: HMDIABEYEEXA  Last diabetic Foot exam: No results found for: HMDIABFOOTEX   No results found for: CHOL, HDL, LDLCALC, LDLDIRECT, TRIG, CHOLHDL  Hepatic Function Latest Ref Rng & Units 04/08/2020 04/13/2019 03/18/2018  Total Protein 6.0 - 8.5 g/dL 6.7 6.9 6.9  Albumin 3.7 - 4.7 g/dL 4.2 3.9 4.2  AST 0 - 40 IU/L 37 35 19  ALT 0 - 32 IU/L $Remov'30 26 15  'RxqzPp$ Alk Phosphatase 44 - 121 IU/L 50 79 73  Total Bilirubin 0.0 - 1.2 mg/dL 0.3 0.3 0.2    Lab Results  Component Value Date/Time   TSH 1.770 10/19/2019 10:13 AM   TSH 2.670 04/13/2019 11:40 AM   FREET4 0.94 10/19/2019 10:13 AM   FREET4 0.74 (L) 04/13/2019 11:40 AM    CBC Latest Ref Rng & Units 04/13/2019 03/18/2018 03/12/2016  WBC 3.4 - 10.8 x10E3/uL 3.9 4.2 4.2  Hemoglobin 11.1 - 15.9 g/dL 11.4 11.8 12.1  Hematocrit 34.0 - 46.6 % 33.6(L) 34.5 35.2  Platelets 150 - 450 x10E3/uL 124(L) 160 165    Lab Results  Component Value Date/Time  VD25OH 26.8 (L) 04/13/2019 11:40 AM   VD25OH 29.5 (L) 03/18/2018 02:04 PM    Clinical ASCVD: No  The 10-year ASCVD risk score Denman George DC Jr., et al., 2013) is: 22.5%   Values used to calculate the score:     Age: 55 years     Sex: Female     Is Non-Hispanic African American: No     Diabetic: No     Tobacco smoker: No     Systolic Blood Pressure: 158 mmHg     Is BP treated: Yes     HDL Cholesterol: 30 mg/dL     Total Cholesterol: 142 mg/dL    Depression screen [facility] 2/9 10/24/2020 05/06/2020 04/04/2020  Decreased Interest 0 0 0  Down, Depressed, Hopeless 0 0 0   PHQ - 2 Score 0 0 0  Altered sleeping - - -  Tired, decreased energy - - -  Change in appetite - - -  Feeling bad or failure about yourself  - - -  Trouble concentrating - - -  Moving slowly or fidgety/restless - - -  Suicidal thoughts - - -  PHQ-9 Score - - -  Difficult doing work/chores - - -      Social History   Tobacco Use  Smoking Status Never  Smokeless Tobacco Never   BP Readings from Last 3 Encounters:  10/24/20 (!) 158/78  09/27/20 130/72  06/17/20 138/82   Pulse Readings from Last 3 Encounters:  10/24/20 70  09/27/20 90  06/17/20 90   Wt Readings from Last 3 Encounters:  10/24/20 271 lb (122.9 kg)  09/27/20 272 lb 9.6 oz (123.7 kg)  06/17/20 276 lb (125.2 kg)   BMI Readings from Last 3 Encounters:  10/24/20 52.93 kg/m  09/27/20 53.24 kg/m  06/17/20 53.90 kg/m    Assessment/Interventions: Review of patient past medical history, allergies, medications, health status, including review of consultants reports, laboratory and other test data, was performed as part of comprehensive evaluation and provision of chronic care management services.   SDOH:  (Social Determinants of Health) assessments and interventions performed: Yes   Financial Resource Strain: Low Risk    Difficulty of Paying Living Expenses: Not very hard    SDOH Screenings   Alcohol Screen: Low Risk    Last Alcohol Screening Score (AUDIT): 0  Depression (PHQ2-9): Low Risk    PHQ-2 Score: 0  Financial Resource Strain: Low Risk    Difficulty of Paying Living Expenses: Not very hard  Food Insecurity: Not on file  Housing: Not on file  Physical Activity: Not on file  Social Connections: Not on file  Stress: Not on file  Tobacco Use: Low Risk    Smoking Tobacco Use: Never   Smokeless Tobacco Use: Never  Transportation Needs: Not on file    CCM Care Plan  No Known Allergies  Medications Reviewed Today     Reviewed by Jeannetta Ellis, CMA (Certified Medical Assistant) on  10/24/20 at 0901  Med List Status: <None>   Medication Order Taking? Sig Documenting Provider Last Dose Status Informant  albuterol (VENTOLIN HFA) 108 (90 Base) MCG/ACT inhaler [id number]  Inhale 2 puffs into the lungs every 6 (six) hours as needed for wheezing or shortness of breath. Carlean Jews, NP  Active   alendronate (FOSAMAX) 70 MG tablet [id number]  Take 1 tablet (70 mg total) by mouth every 7 (seven) days. Take with a full glass of water on an empty stomach. Carlean Jews, NP  Active  cholecalciferol (VITAMIN D) 1000 units tablet [id number]  Take 1,000 Units by mouth daily. [provider]  Active   ciprofloxacin (CIPRO) 500 MG tablet [id number]  Take 1 tablet (500 mg total) by mouth 2 (two) times daily. For 7 days Lavera Guise, MD  Active   Cyanocobalamin (VITAMIN B-12 PO) [id number]  Take by mouth. Pt takes [place] gummies [provider]  Active   diclofenac Sodium (VOLTAREN) 1 % GEL [id number]  Apply topically 4 (four) times daily. [provider]  Active   diphenoxylate-atropine (LOMOTIL) 2.5-0.025 MG tablet [id number]  Take 1 tablet by mouth 4 (four) times daily as needed for diarrhea or loose stools. Ronnell Freshwater, NP  Active   docusate sodium (COLACE) 100 MG capsule [id number]  Take 200 mg by mouth daily.  [provider]  Active            Med Note Serena Colonel   Tue Jun 07, 2015 11:15 AM) Received from: [facility]  DULoxetine (CYMBALTA) 30 MG capsule [id number]  Take 1 capsule po bid Ronnell Freshwater, NP  Active   esomeprazole (NEXIUM) 20 MG capsule [id number]  Take 20 mg by mouth daily at 12 noon. [provider]  Active Self  fluticasone-salmeterol (ADVAIR) 250-50 MCG/ACT AEPB [id number]  Inhale 1 puff into the lungs in the morning and at bedtime. Lavera Guise, MD  Active   furosemide (LASIX) 20 MG tablet [id number]  Take 1 tablet (20 mg total) by mouth daily. Ronnell Freshwater, NP  Active   gabapentin (NEURONTIN)  300 MG capsule [id number]  Take 2 cap twice a daily for neuropathy Lavera Guise, MD  Active   ibuprofen (ADVIL) 800 MG tablet [id number]  Take 1 tablet (800 mg total) by mouth 2 (two) times daily as needed. Reported on 06/22/2015 Ronnell Freshwater, NP  Active   ipratropium-albuterol (DUONEB) 0.5-2.5 (3) MG/3ML SOLN [id number]  Take 3 mLs by nebulization every 4 (four) hours as needed. Ronnell Freshwater, NP  Active   levothyroxine (SYNTHROID) 50 MCG tablet [id number]  Take 1 tablet (50 mcg total) by mouth daily. Ronnell Freshwater, NP  Active   lisinopril (ZESTRIL) 20 MG tablet [id number]  Take 1 tablet (20 mg total) by mouth daily. Lavera Guise, MD  Active   Magnesium 250 MG TABS [id number]  Take by mouth. [provider]  Active            Med Note Koleen Nimrod, RACHEL Carlean Jews   Wed Jun 22, 2015  3:46 PM) Reports taking once a day  metoprolol succinate (TOPROL-XL) 50 MG 24 hr tablet [id number]  Take 50 mg by mouth daily. Take with or immediately following a meal. [provider]  Active   Potassium 99 MG TABS [id number]  Take by mouth. [provider]  Active            Med Note Serena Colonel   Thu Jun 16, 2015 11:19 AM) Takes 2 tablets everyday.  rosuvastatin (CRESTOR) 10 MG tablet [id number]  Take 10 mg by mouth 2 (two) times a week. [provider]  Active   spironolactone (ALDACTONE) 25 MG tablet [id number]   [provider]  Active             Patient Active Problem List   Diagnosis Date Noted   Dyspnea on exertion 04/03/2020   Hx of malignant neoplasm of uterine body 11/03/2019   Left lower quadrant abdominal pain 10/23/2019  Age-related osteoporosis without current pathological fracture 10/23/2019   Encounter for screening mammogram for malignant neoplasm of breast 10/23/2019   Acquired hypothyroidism 08/02/2019   Other symptoms and signs involving the nervous system 04/06/2019   Memory loss 04/06/2019   Acute upper respiratory infection  01/27/2019   Exposure to COVID-19 virus 01/27/2019   Intermittent claudication ([place]) 11/01/2018   Moderate major depression ([place]) 09/22/2018   Peripheral arterial disease ([place]) 06/25/2018   Diverticulitis of large intestine without perforation or abscess without bleeding 05/29/2018   Other fatigue 03/20/2018   Dysuria 03/20/2018   Gastroenteritis, acute 01/26/2018   Diarrhea 01/26/2018   Nausea 29/19/1660   Chronic systolic heart failure ([place]) 01/26/2018   Cough variant asthma 12/07/2017   Need for vaccination against Streptococcus pneumoniae using pneumococcal conjugate vaccine 13 12/07/2017   AICD lead displacement 11/20/2017   Cardiac resynchronization therapy defibrillator (CRT-D) in place 11/14/2017   Dilated cardiomyopathy ([place]) 09/04/2017   Left bundle branch block 09/04/2017   OSA on CPAP 07/31/2017   Abscess of female pelvis 05/07/2014   Acute deep vein thrombosis (DVT) of femoral vein of left lower extremity ([place]) 03/29/2014   Lumbago-sciatica due to displacement of lumbar intervertebral disc 12/28/2013   Chronic bilateral low back pain with bilateral sciatica 12/28/2013   Neuropathy due to chemotherapeutic drug ([place]) 11/02/2013   Encounter for general adult medical examination with abnormal findings 06/01/2013   Morbid obesity ([place]) 04/20/2013   Essential hypertension 04/13/2013   Malignant neoplasm of endometrium ([place]) 03/30/2013   Shortness of breath 03/30/2013   CMC arthritis, thumb, degenerative 01/02/2013    Immunization History  Administered Date(s) Administered   Influenza, High Dose Seasonal PF 04/27/2018, 02/25/2019   Influenza,inj,Quad PF,6+ Mos 03/30/2014   Influenza-Unspecified 01/28/2017   PFIZER(Purple Top)SARS-COV-2 Vaccination 06/24/2019, 07/15/2019, 03/23/2020, 07/21/2020   Pneumococcal Polysaccharide-23 02/23/2013   Tdap 02/24/1960   Zoster, Unspecified 02/23/1998    Conditions to be addressed/monitored:  HTN, Chronic Systolic HF,  Hypothyroidism, Obesity, Depression,   Care Plan : General Pharmacy (Adult)  Updates made by Edythe Clarity, RPH since 11/23/2020 12:00 AM     Problem: HTN, Chronic Systolic HF, Hypothyroidism, Obesity, Depression,   Priority: High  Onset Date: 08/03/2020     Long-Range Goal: Patient-Specific Goal   Start Date: 08/03/2020  Expected End Date: 02/02/2021  Recent Progress: On track  Priority: High  Note:   Current Barriers:  Unable to achieve control of pain level.   Pharmacist Clinical Goal(s):  Patient will achieve adherence to monitoring guidelines and medication adherence to achieve therapeutic efficacy maintain control of blood pressure as evidenced by home monitoring  adhere to prescribed medication regimen as evidenced by fill dates and use of pill box through collaboration with PharmD and provider.   Interventions: 1:1 collaboration with Lavera Guise, MD regarding development and update of comprehensive plan of care as evidenced by provider attestation and co-signature Inter-disciplinary care team collaboration (see longitudinal plan of care) Comprehensive medication review performed; medication list updated in electronic medical record  Hypertension (BP goal <140/90) -Controlled -Current treatment: Lisinopril 20mg  Metoprolol XL 50mg  daily -Medications previously tried: spironolactone, HCTZ -Current home readings: 130/70 per patient recall -Current dietary habits: she is starting to watch her salt, now drinking protein shakes to replace her lack of protein intake as she does not cook meat much -Current exercise habits: minimal, gets SOB walking through house -Denies hypotensive/hypertensive symptoms -Educated on BP goals and benefits of medications for prevention of heart attack, stroke and kidney damage; Daily  salt intake goal < 2300 mg; Importance of home blood pressure monitoring; -Counseled to monitor BP at home daily, document, and provide log at future  appointments -Recommended to continue current medication Recommended to continue to watch salt intake  Heart Failure (Goal: manage symptoms and prevent exacerbations) -Controlled -Last ejection fraction: 35-45% (Date: 2014) -HF type: Systolic  -Current treatment: Furosemide 20mg  Metoprolol XL 50mg  daily - [place] Records -Medications previously tried: none noted  -Current home BP/HR readings: 130/70 P 60s -Current dietary habits: patient watching salt intake, she reports she has lost approx. 14 lbs recently -Current exercise habits: minimal mainly due to pain -Educated on Benefits of medications for managing symptoms and prolonging life Importance of weighing daily; if you gain more than 3 pounds in one day or 5 pounds in one week, call providers Proper diuretic administration and potassium supplementation -Recommended to continue current medication  Depression/Anxiety (Goal: Minimize symptoms) -Controlled -Current treatment: Duloxetine 30mg  daily -Medications previously tried/failed: fluoxetine -Educated on Benefits of medication for symptom control  -Patient only taking once daily due to nausea, she is tolerating this dose fine -Reports no symptoms of depression -Recommended to continue current medication  Obesity (Goal: Control weight/weight loss) -Controlled -Current treatment  None noted -Medications previously tried: none noted -She reports a recent weight loss of about 14 lbs.  She is watching what she eats. -Reports pain and SOB from walking within the house.  -Counseled on diet and exercise extensively Counseled on acceptable weight loss per week.  Encouraged her to continue the good work with lifestyle mods.  Update 11/23/20 Recently started on Trulicity samples about one month ago. She is tolerating this well and is finished with them - reports about 4-5 lbs. Of weight loss in a month.  Denies any GI issues. Plan was originally to switch to tablet form after she  completed this, however she does not mind the shots. Would recommend seeing which one is covered best on insurance and move forward with that GLP-1.  Encouraged her to reach out if cost was an issue so we could look at PAP options. Encouraged her to continue the good progress.  Hypothyroidism (Goal: maintain TSH) -Controlled -Current treatment  Levothyroxine 76mcg daily -Medications previously tried: none noted -Patient verbalized taking correctly.  Takes 30 minutes before other meds, food, or drink in the morning on empty stomach. -Most recent TSH well controlled.  -Recommended to continue current medication  Pain(Goal: minimize pain level) -Not ideally controlled -Current treatment  Gabapentin 100mg  two tablets twice daily IBU 800mg  bid prn Tramadol 50mg  q6h prn -Medications previously tried: none noted -Patient currently only taking IBU very occasionally -Reports pain is constantly 8/10 level.  -She does not like to take a lot of medication for pain. -Gabapentin helps mainly at night time when she wants to sleep. -Recommended to continue current medication Recommended she could also try Tylenol on days where she does not want to take IBU.  Update 11/23/20 Recently started on tramadol. Pain level 5/10. She was able to mop the kitchen yesterday!! She is in much better spirits today in regards to her pain. Was some confusion on gabapentin 100mg  vs. 300mg  - she ran into an issue at her pharmacy.  Discussed the new updated Rx was for 100mg  two tablets twice daily - patient understands. Only using tramadol once daily and infrequently. She is also doing exercises laying on her bed to help her back pain. Continue current meds!   Health Maintenance -Vaccine gaps: PCV 13 -Educated on benefits  of pneumonia vaccine and how she can go about getting it. -Patient agreeable to recommendation. -Plans to get it at pharmacy or in MD office next visit.   Patient Goals/Self-Care  Activities Patient will:  - take medications as prescribed check blood pressure daily, document, and provide at future appointments weigh daily, and contact provider if weight gain of 3 pounds in one day or 5 pounds in a week engage in dietary modifications by continuing to limit sodium intake.  Follow Up Plan: The care management team will reach out to the patient again over the next 120 days.        Medication Assistance: None required.  Patient affirms current coverage meets needs.  Patient's preferred pharmacy is:  [facility] - [place], [address] [place] [address] Phone: [phone] Fax: [phone]  Uses pill box? Yes Pt endorses 100% compliance  We discussed: Benefits of medication synchronization, packaging and delivery as well as enhanced pharmacist oversight with [employer]. Patient decided to: Continue current medication management strategy  Care Plan and Follow Up Patient Decision:  Patient agrees to Care Plan and Follow-up.  Plan: The care management team will reach out to the patient again over the next 90 days.  Beverly Milch, PharmD Clinical Pharmacist [facility] [phone]

## 2020-11-24 ENCOUNTER — Ambulatory Visit (INDEPENDENT_AMBULATORY_CARE_PROVIDER_SITE_OTHER): Payer: Medicare HMO | Admitting: Physician Assistant

## 2020-11-24 ENCOUNTER — Other Ambulatory Visit: Payer: Self-pay

## 2020-11-24 ENCOUNTER — Encounter: Payer: Self-pay | Admitting: Physician Assistant

## 2020-11-24 DIAGNOSIS — Z6841 Body Mass Index (BMI) 40.0 and over, adult: Secondary | ICD-10-CM

## 2020-11-24 DIAGNOSIS — M5441 Lumbago with sciatica, right side: Secondary | ICD-10-CM

## 2020-11-24 DIAGNOSIS — M5442 Lumbago with sciatica, left side: Secondary | ICD-10-CM | POA: Diagnosis not present

## 2020-11-24 DIAGNOSIS — I5022 Chronic systolic (congestive) heart failure: Secondary | ICD-10-CM

## 2020-11-24 DIAGNOSIS — Z23 Encounter for immunization: Secondary | ICD-10-CM | POA: Diagnosis not present

## 2020-11-24 DIAGNOSIS — R7301 Impaired fasting glucose: Secondary | ICD-10-CM

## 2020-11-24 DIAGNOSIS — G8929 Other chronic pain: Secondary | ICD-10-CM

## 2020-11-24 MED ORDER — TRAMADOL HCL 50 MG PO TABS: 50.0000 mg | ORAL_TABLET | Freq: Three times a day (TID) | ORAL | 0 refills | Status: AC | PRN

## 2020-11-24 MED ORDER — TRULICITY 0.75 MG/0.5ML ~~LOC~~ SOAJ: 0.7500 mg | SUBCUTANEOUS | 2 refills | Status: DC

## 2020-11-24 MED ORDER — ZOSTER VAC RECOMB ADJUVANTED 50 MCG/0.5ML IM SUSR: 0.5000 mL | Freq: Once | INTRAMUSCULAR | 0 refills | Status: AC

## 2020-11-24 MED ORDER — TETANUS-DIPHTH-ACELL PERTUSSIS 5-2.5-18.5 LF-MCG/0.5 IM SUSP: 0.5000 mL | Freq: Once | INTRAMUSCULAR | 0 refills | Status: AC

## 2020-11-24 NOTE — Progress Notes (Signed)
[facility] [place], [place] [address]  Internal MEDICINE  Office Visit Note  Patient Name: Diana Bernard  [id number]  ST:[id number]  Date of Service: 11/24/2020  Chief Complaint  Patient presents with   Follow-up   Hypertension   Depression    HPI Pt is here for routine follow up -Biggest meal at lunch time and has been watching her diet to work on weight loss -Tolerating trulicity well and has lost 13lbs since last visit--will send script today. Patient discussed with pharmacist and was told to contact him if cost of trulicity was prohibitive and required any assistance/alternative -Colonoscopy scheduled for November -Did not have labs ordered last visit done and will do this now -Taking tramadol as needed and is helping her mobility. She does request a refill of this today for #15  Current Medication: Outpatient Encounter Medications as of 11/24/2020  Medication Sig Note   albuterol (VENTOLIN HFA) 108 (90 Base) MCG/ACT inhaler Inhale 2 puffs into the lungs every 6 (six) hours as needed for wheezing or shortness of breath.    alendronate (FOSAMAX) 70 MG tablet Take 1 tablet (70 mg total) by mouth every 7 (seven) days. Take with a full glass of water on an empty stomach.    cholecalciferol (VITAMIN D) 1000 units tablet Take 1,000 Units by mouth daily.    Cyanocobalamin (VITAMIN B-12 PO) Take by mouth. Pt takes [place] gummies    diclofenac Sodium (VOLTAREN) 1 % GEL Apply topically 4 (four) times daily.    diphenoxylate-atropine (LOMOTIL) 2.5-0.025 MG tablet Take 1 tablet by mouth 4 (four) times daily as needed for diarrhea or loose stools.    docusate sodium (COLACE) 100 MG capsule Take 200 mg by mouth daily.  06/07/2015: Received from: [place] (TRULICITY) [id number] SOPN Inject 0.75 mg into the skin once a week.    DULoxetine (CYMBALTA) 30 MG capsule Take 1 capsule po bid    esomeprazole (NEXIUM) 20 MG capsule Take 20 mg  by mouth daily at 12 noon.    fluticasone-salmeterol (ADVAIR) 250-50 MCG/ACT AEPB Inhale 1 puff into the lungs in the morning and at bedtime.    furosemide (LASIX) 20 MG tablet Take 1 tablet (20 mg total) by mouth daily.    gabapentin (NEURONTIN) 300 MG capsule Take 300 mg by mouth 3 (three) times daily.    ipratropium-albuterol (DUONEB) 0.5-2.5 (3) MG/3ML SOLN Take 3 mLs by nebulization every 4 (four) hours as needed.    lisinopril (ZESTRIL) 20 MG tablet Take 1 tablet (20 mg total) by mouth daily.    Magnesium 250 MG TABS Take by mouth. 06/22/2015: Reports taking once a day   metoprolol succinate (TOPROL-XL) 50 MG 24 hr tablet Take 50 mg by mouth daily. Take with or immediately following a meal.    Potassium 99 MG TABS Take by mouth. 06/16/2015: Takes 2 tablets everyday.   rosuvastatin (CRESTOR) 10 MG tablet Take 10 mg by mouth 2 (two) times a week.    spironolactone (ALDACTONE) 25 MG tablet 25 mg daily.    traMADol (ULTRAM) 50 MG tablet Take 1 tablet (50 mg total) by mouth every 8 (eight) hours as needed for up to 5 days.    [DISCONTINUED] ciprofloxacin (CIPRO) 500 MG tablet Take 1 tablet (500 mg total) by mouth 2 (two) times daily. For 7 days    [DISCONTINUED] gabapentin (NEURONTIN) 100 MG capsule Take 2 tabs 2 x day    [DISCONTINUED] ibuprofen (ADVIL) 800  MG tablet Take 1 tablet (800 mg total) by mouth 2 (two) times daily as needed. Reported on 06/22/2015    [DISCONTINUED] levothyroxine (SYNTHROID) 50 MCG tablet Take 1 tablet (50 mcg total) by mouth daily.    [DISCONTINUED] Tdap (BOOSTRIX) 5-2.5-18.5 LF-MCG/0.5 injection Inject 0.5 mLs into the muscle once.    [DISCONTINUED] Zoster Vaccine Adjuvanted [facility]) injection Inject 0.5 mLs into the muscle once.    Tdap (BOOSTRIX) 5-2.5-18.5 LF-MCG/0.5 injection Inject 0.5 mLs into the muscle once for 1 dose.    Zoster Vaccine Adjuvanted [facility]) injection Inject 0.5 mLs into the muscle once for 1 dose.    No facility-administered encounter  medications on file as of 11/24/2020.    Surgical History: Past Surgical History:  Procedure Laterality Date   ABCESS DRAINAGE  2016   Abdominal abcess due to diverticulitis   caridoverter defibrillator  11/14/2017   ICD   CATARACT EXTRACTION W/PHACO Left 10/26/2015   Procedure: CATARACT EXTRACTION PHACO AND INTRAOCULAR LENS PLACEMENT (IOC) LEFT EYE;  Surgeon: Leandrew Koyanagi, MD;  Location: [place];  Service: Ophthalmology;  Laterality: Left;   CATARACT EXTRACTION W/PHACO Right 12/07/2015   Procedure: CATARACT EXTRACTION PHACO AND INTRAOCULAR LENS PLACEMENT (IOC);  Surgeon: Leandrew Koyanagi, MD;  Location: [place];  Service: Ophthalmology;  Laterality: Right;   CHOLECYSTECTOMY     FOOT SURGERY     Per patient, bilateral foot surgery.   PELVIC FRACTURE SURGERY     Per patient for cancer.    Medical History: Past Medical History:  Diagnosis Date   Arthritis    Asthma    Cancer ([place])    Deep vein thrombosis (DVT) ([place]) 2015   Depression    GERD (gastroesophageal reflux disease)    Hypertension    Weakness of both legs     Family History: Family History  Problem Relation Age of Onset   Depression Sister    Heart disease Sister    Early death Sister    Depression Sister    Depression Sister    Depression Sister    Depression Sister     Social History   Socioeconomic History   Marital status: Married    Spouse name: Not on file   Number of children: Not on file   Years of education: Not on file   Highest education level: Not on file  Occupational History   Not on file  Tobacco Use   Smoking status: Never   Smokeless tobacco: Never  Vaping Use   Vaping Use: Never used  Substance and Sexual Activity   Alcohol use: No   Drug use: No   Sexual activity: Not Currently  Other Topics Concern   Not on file  Social History Narrative   Not on file   Social Determinants of Health   Financial Resource Strain: Low Risk    Difficulty of  Paying Living Expenses: Not very hard  Food Insecurity: Not on file  Transportation Needs: Not on file  Physical Activity: Not on file  Stress: Not on file  Social Connections: Not on file  Intimate Partner Violence: Not on file      Review of Systems  Constitutional:  Negative for chills, fatigue and unexpected weight change.  HENT:  Negative for congestion, postnasal drip, rhinorrhea, sneezing and sore throat.   Eyes:  Negative for redness.  Respiratory:  Negative for cough, chest tightness and shortness of breath.   Cardiovascular:  Negative for chest pain and palpitations.  Gastrointestinal:  Negative for abdominal  pain, constipation, diarrhea, nausea and vomiting.  Genitourinary:  Negative for dysuria and frequency.  Musculoskeletal:  Positive for arthralgias and back pain. Negative for joint swelling and neck pain.  Skin:  Negative for rash.  Neurological: Negative.  Negative for tremors and numbness.  Hematological:  Negative for adenopathy. Does not bruise/bleed easily.  Psychiatric/Behavioral:  Negative for behavioral problems (Depression), sleep disturbance and suicidal ideas. The patient is not nervous/anxious.    Vital Signs: BP 136/60 Comment: 145/67  Pulse 67   Temp 97.8 F (36.6 C)   Resp 16   Ht 5' (1.524 m)   Wt 258 lb (117 kg)   SpO2 97%   BMI 50.39 kg/m    Physical Exam Vitals and nursing note reviewed.  Constitutional:      General: She is not in acute distress.    Appearance: She is well-developed. She is obese. She is not diaphoretic.  HENT:     Head: Normocephalic and atraumatic.     Mouth/Throat:     Pharynx: No oropharyngeal exudate.  Eyes:     Pupils: Pupils are equal, round, and reactive to light.  Neck:     Thyroid: No thyromegaly.     Vascular: No JVD.     Trachea: No tracheal deviation.  Cardiovascular:     Rate and Rhythm: Normal rate and regular rhythm.     Heart sounds: Normal heart sounds. No murmur heard.   No friction rub. No  gallop.  Pulmonary:     Effort: Pulmonary effort is normal. No respiratory distress.     Breath sounds: No wheezing or rales.  Chest:     Chest wall: No tenderness.  Abdominal:     General: Bowel sounds are normal.     Palpations: Abdomen is soft.  Musculoskeletal:        General: Normal range of motion.     Cervical back: Normal range of motion and neck supple.  Lymphadenopathy:     Cervical: No cervical adenopathy.  Skin:    General: Skin is warm and dry.  Neurological:     Mental Status: She is alert and oriented to person, place, and time.     Cranial Nerves: No cranial nerve deficit.  Psychiatric:        Behavior: Behavior normal.        Thought Content: Thought content normal.        Judgment: Judgment normal.       Assessment/Plan: 1. Impaired fasting glucose Continue trulicity for sugar control and wt loss. - Dulaglutide (TRULICITY) A999333 0000000 SOPN; Inject 0.75 mg into the skin once a week.  Dispense: 3 mL; Refill: 2  2. Chronic bilateral low back pain with bilateral sciatica May use tramadol as needed  - traMADol (ULTRAM) 50 MG tablet; Take 1 tablet (50 mg total) by mouth every 8 (eight) hours as needed for up to 5 days.  Dispense: 15 tablet; Refill: 0  3. Chronic systolic heart failure ([place]) Followed by cardiology  4. Need for Tdap vaccination - Tdap ([place]) 5-2.5-18.5 LF-MCG/0.5 injection; Inject 0.5 mLs into the muscle once for 1 dose.  Dispense: 0.5 mL; Refill: 0  5. Need for shingles vaccine - Zoster Vaccine Adjuvanted [facility]) injection; Inject 0.5 mLs into the muscle once for 1 dose.  Dispense: 0.5 mL; Refill: 0  6. Morbid obesity with BMI of 50.0-59.9, adult ([place]) Has lost 13 lbs since last visit and applauded for this. Continue trulicity and working on wt loss goals  Obesity Counseling:  Risk Assessment: An assessment of behavioral risk factors was made today and includes lack of exercise sedentary lifestyle, lack of portion control and poor  dietary habits.  Risk Modification Advice: She was counseled on portion control guidelines. Restricting daily caloric intake to 1500. The detrimental long term effects of obesity on her health and ongoing poor compliance was also discussed with the patient.     General Counseling: Illianna verbalizes understanding of the findings of todays visit and agrees with plan of treatment. I have discussed any further diagnostic evaluation that may be needed or ordered today. We also reviewed her medications today. she has been encouraged to call the office with any questions or concerns that should arise related to todays visit.    No orders of the defined types were placed in this encounter.   Meds ordered this encounter  Medications   Zoster Vaccine Adjuvanted [facility]) injection    Sig: Inject 0.5 mLs into the muscle once for 1 dose.    Dispense:  0.5 mL    Refill:  0   Tdap (BOOSTRIX) 5-2.5-18.5 LF-MCG/0.5 injection    Sig: Inject 0.5 mLs into the muscle once for 1 dose.    Dispense:  0.5 mL    Refill:  0   Dulaglutide (TRULICITY) A999333 0000000 SOPN    Sig: Inject 0.75 mg into the skin once a week.    Dispense:  3 mL    Refill:  2   traMADol (ULTRAM) 50 MG tablet    Sig: Take 1 tablet (50 mg total) by mouth every 8 (eight) hours as needed for up to 5 days.    Dispense:  15 tablet    Refill:  0     This patient was seen by Drema Dallas, PA-C in collaboration with Dr. Clayborn Bigness as a part of collaborative care agreement.   Total time spent:35 Minutes Time spent includes review of chart, medications, test results, and follow up plan with the patient.      Dr Lavera Guise Internal medicine

## 2020-11-27 DIAGNOSIS — I5022 Chronic systolic (congestive) heart failure: Secondary | ICD-10-CM | POA: Diagnosis not present

## 2020-11-27 DIAGNOSIS — I10 Essential (primary) hypertension: Secondary | ICD-10-CM | POA: Diagnosis not present

## 2020-11-27 DIAGNOSIS — E039 Hypothyroidism, unspecified: Secondary | ICD-10-CM | POA: Diagnosis not present

## 2020-12-02 DIAGNOSIS — N1831 Chronic kidney disease, stage 3a: Secondary | ICD-10-CM | POA: Diagnosis not present

## 2020-12-02 DIAGNOSIS — E038 Other specified hypothyroidism: Secondary | ICD-10-CM | POA: Diagnosis not present

## 2020-12-03 LAB — BASIC METABOLIC PANEL
BUN/Creatinine Ratio: 19 (ref 12–28)
BUN: 30 mg/dL — ABNORMAL HIGH (ref 8–27)
CO2: 23 mmol/L (ref 20–29)
Calcium: 10.4 mg/dL — ABNORMAL HIGH (ref 8.7–10.3)
Chloride: 98 mmol/L (ref 96–106)
Creatinine, Ser: 1.56 mg/dL — ABNORMAL HIGH (ref 0.57–1.00)
Glucose: 102 mg/dL — ABNORMAL HIGH (ref 65–99)
Potassium: 4.7 mmol/L (ref 3.5–5.2)
Sodium: 136 mmol/L (ref 134–144)
eGFR: 35 mL/min/{1.73_m2} — ABNORMAL LOW (ref >59)

## 2020-12-03 LAB — TSH+FREE T4
Free T4: 0.84 ng/dL (ref 0.82–1.77)
TSH: 2.57 u[IU]/mL (ref 0.450–4.500)

## 2020-12-07 ENCOUNTER — Other Ambulatory Visit: Payer: Self-pay | Admitting: Nurse Practitioner

## 2020-12-07 DIAGNOSIS — M810 Age-related osteoporosis without current pathological fracture: Secondary | ICD-10-CM

## 2020-12-08 DIAGNOSIS — K6289 Other specified diseases of anus and rectum: Secondary | ICD-10-CM | POA: Diagnosis not present

## 2020-12-08 DIAGNOSIS — Z86010 Personal history of colonic polyps: Secondary | ICD-10-CM | POA: Diagnosis not present

## 2020-12-08 DIAGNOSIS — C20 Malignant neoplasm of rectum: Secondary | ICD-10-CM | POA: Diagnosis not present

## 2020-12-08 DIAGNOSIS — J449 Chronic obstructive pulmonary disease, unspecified: Secondary | ICD-10-CM | POA: Diagnosis not present

## 2020-12-08 DIAGNOSIS — I509 Heart failure, unspecified: Secondary | ICD-10-CM | POA: Diagnosis not present

## 2020-12-08 DIAGNOSIS — D490 Neoplasm of unspecified behavior of digestive system: Secondary | ICD-10-CM | POA: Diagnosis not present

## 2020-12-08 DIAGNOSIS — I110 Hypertensive heart disease with heart failure: Secondary | ICD-10-CM | POA: Diagnosis not present

## 2020-12-08 DIAGNOSIS — K5669 Other partial intestinal obstruction: Secondary | ICD-10-CM | POA: Diagnosis not present

## 2020-12-08 DIAGNOSIS — Z6841 Body Mass Index (BMI) 40.0 and over, adult: Secondary | ICD-10-CM | POA: Diagnosis not present

## 2020-12-08 DIAGNOSIS — C189 Malignant neoplasm of colon, unspecified: Secondary | ICD-10-CM | POA: Diagnosis not present

## 2020-12-08 DIAGNOSIS — Z1211 Encounter for screening for malignant neoplasm of colon: Secondary | ICD-10-CM | POA: Diagnosis not present

## 2020-12-16 DIAGNOSIS — Z95810 Presence of automatic (implantable) cardiac defibrillator: Secondary | ICD-10-CM | POA: Diagnosis not present

## 2020-12-16 DIAGNOSIS — Z4502 Encounter for adjustment and management of automatic implantable cardiac defibrillator: Secondary | ICD-10-CM | POA: Diagnosis not present

## 2020-12-16 DIAGNOSIS — R918 Other nonspecific abnormal finding of lung field: Secondary | ICD-10-CM | POA: Diagnosis not present

## 2020-12-16 DIAGNOSIS — R590 Localized enlarged lymph nodes: Secondary | ICD-10-CM | POA: Diagnosis not present

## 2020-12-16 DIAGNOSIS — C20 Malignant neoplasm of rectum: Secondary | ICD-10-CM | POA: Diagnosis not present

## 2020-12-16 NOTE — Progress Notes (Signed)
Review.

## 2020-12-19 DIAGNOSIS — I509 Heart failure, unspecified: Secondary | ICD-10-CM | POA: Diagnosis not present

## 2020-12-19 DIAGNOSIS — C20 Malignant neoplasm of rectum: Secondary | ICD-10-CM | POA: Diagnosis not present

## 2020-12-19 DIAGNOSIS — Z8542 Personal history of malignant neoplasm of other parts of uterus: Secondary | ICD-10-CM | POA: Diagnosis not present

## 2020-12-19 DIAGNOSIS — Z6841 Body Mass Index (BMI) 40.0 and over, adult: Secondary | ICD-10-CM | POA: Diagnosis not present

## 2020-12-19 DIAGNOSIS — I110 Hypertensive heart disease with heart failure: Secondary | ICD-10-CM | POA: Diagnosis not present

## 2020-12-19 DIAGNOSIS — I428 Other cardiomyopathies: Secondary | ICD-10-CM | POA: Diagnosis not present

## 2020-12-19 DIAGNOSIS — Z95810 Presence of automatic (implantable) cardiac defibrillator: Secondary | ICD-10-CM | POA: Diagnosis not present

## 2020-12-19 DIAGNOSIS — J449 Chronic obstructive pulmonary disease, unspecified: Secondary | ICD-10-CM | POA: Diagnosis not present

## 2020-12-21 DIAGNOSIS — G893 Neoplasm related pain (acute) (chronic): Secondary | ICD-10-CM | POA: Diagnosis not present

## 2020-12-21 DIAGNOSIS — G6289 Other specified polyneuropathies: Secondary | ICD-10-CM | POA: Diagnosis not present

## 2020-12-21 DIAGNOSIS — N182 Chronic kidney disease, stage 2 (mild): Secondary | ICD-10-CM | POA: Diagnosis not present

## 2020-12-21 DIAGNOSIS — C20 Malignant neoplasm of rectum: Secondary | ICD-10-CM | POA: Diagnosis not present

## 2020-12-26 DIAGNOSIS — C20 Malignant neoplasm of rectum: Secondary | ICD-10-CM | POA: Diagnosis not present

## 2020-12-28 DIAGNOSIS — J449 Chronic obstructive pulmonary disease, unspecified: Secondary | ICD-10-CM | POA: Diagnosis not present

## 2020-12-28 DIAGNOSIS — I110 Hypertensive heart disease with heart failure: Secondary | ICD-10-CM | POA: Diagnosis not present

## 2020-12-28 DIAGNOSIS — C20 Malignant neoplasm of rectum: Secondary | ICD-10-CM | POA: Diagnosis not present

## 2020-12-28 DIAGNOSIS — F32A Depression, unspecified: Secondary | ICD-10-CM | POA: Diagnosis not present

## 2020-12-28 DIAGNOSIS — E119 Type 2 diabetes mellitus without complications: Secondary | ICD-10-CM | POA: Diagnosis not present

## 2020-12-28 DIAGNOSIS — K219 Gastro-esophageal reflux disease without esophagitis: Secondary | ICD-10-CM | POA: Diagnosis not present

## 2020-12-28 DIAGNOSIS — C218 Malignant neoplasm of overlapping sites of rectum, anus and anal canal: Secondary | ICD-10-CM | POA: Diagnosis not present

## 2020-12-28 DIAGNOSIS — Z6841 Body Mass Index (BMI) 40.0 and over, adult: Secondary | ICD-10-CM | POA: Diagnosis not present

## 2020-12-28 DIAGNOSIS — I5032 Chronic diastolic (congestive) heart failure: Secondary | ICD-10-CM | POA: Diagnosis not present

## 2020-12-29 ENCOUNTER — Ambulatory Visit (INDEPENDENT_AMBULATORY_CARE_PROVIDER_SITE_OTHER): Payer: Medicare HMO | Admitting: Internal Medicine

## 2020-12-29 ENCOUNTER — Other Ambulatory Visit: Payer: Self-pay

## 2020-12-29 ENCOUNTER — Encounter: Payer: Self-pay | Admitting: Internal Medicine

## 2020-12-29 VITALS — BP 132/72 | HR 65 | Temp 98.3°F | Resp 16 | Ht 60.0 in | Wt 253.0 lb

## 2020-12-29 DIAGNOSIS — M5441 Lumbago with sciatica, right side: Secondary | ICD-10-CM | POA: Diagnosis not present

## 2020-12-29 DIAGNOSIS — M5442 Lumbago with sciatica, left side: Secondary | ICD-10-CM | POA: Diagnosis not present

## 2020-12-29 DIAGNOSIS — Z6841 Body Mass Index (BMI) 40.0 and over, adult: Secondary | ICD-10-CM | POA: Diagnosis not present

## 2020-12-29 DIAGNOSIS — G8929 Other chronic pain: Secondary | ICD-10-CM

## 2020-12-29 DIAGNOSIS — R7301 Impaired fasting glucose: Secondary | ICD-10-CM | POA: Diagnosis not present

## 2020-12-29 MED ORDER — TRULICITY 1.5 MG/0.5ML ~~LOC~~ SOAJ: 1.5000 mg | SUBCUTANEOUS | 3 refills | Status: DC

## 2020-12-29 NOTE — Progress Notes (Signed)
[facility] [place], [place] [address]  Internal MEDICINE  Office Visit Note  Patient Name: Diana Bernard  [id number]  [id number]  Date of Service: 01/04/2021  Chief Complaint  Patient presents with   Medical Management of Chronic Issues    Weight management    Hypertension   Depression   Follow-up    Discuss  cancer    HPI  Patient is here for routine follow-up. -She has done really well with weight loss has lost over 25 pounds since last 19month, has been watching her diet patient is on Trulicity 0A999333mg weekly no side effects are noticed -Patient is seen by orthopedics and is not taking tramadol anymore for chronic back pain -Blood pressure is under good control -Patient continues to do light exercise    Current Medication: Outpatient Encounter Medications as of 12/29/2020  Medication Sig Note   albuterol (VENTOLIN HFA) 108 (90 Base) MCG/ACT inhaler Inhale 2 puffs into the lungs every 6 (six) hours as needed for wheezing or shortness of breath.    alendronate (FOSAMAX) 70 MG tablet Take 1 tablet (70 mg total) by mouth every 7 (seven) days. Take with a full glass of water on an empty stomach.    cholecalciferol (VITAMIN D) 1000 units tablet Take 1,000 Units by mouth daily.    Cyanocobalamin (VITAMIN B-12 PO) Take by mouth. Pt takes [facility] [place] gummies    diclofenac Sodium (VOLTAREN) 1 % GEL Apply topically 4 (four) times daily.    diphenoxylate-atropine (LOMOTIL) 2.5-0.025 MG tablet Take 1 tablet by mouth 4 (four) times daily as needed for diarrhea or loose stools.    docusate sodium (COLACE) 100 MG capsule Take 200 mg by mouth daily.  06/07/2015: [place](TRULICITY) 1.5 M0000000SOPN Inject 1.5 mg into the skin once a week.    DULoxetine (CYMBALTA) 30 MG capsule Take 1 capsule po bid    esomeprazole (NEXIUM) 20 MG capsule Take 20 mg by mouth daily at 12 noon.    fluticasone-salmeterol (ADVAIR) 250-50  MCG/ACT AEPB Inhale 1 puff into the lungs in the morning and at bedtime.    furosemide (LASIX) 20 MG tablet Take 1 tablet (20 mg total) by mouth daily.    gabapentin (NEURONTIN) 300 MG capsule Take 300 mg by mouth 3 (three) times daily.    ipratropium-albuterol (DUONEB) 0.5-2.5 (3) MG/3ML SOLN Take 3 mLs by nebulization every 4 (four) hours as needed.    lisinopril (ZESTRIL) 20 MG tablet Take 1 tablet (20 mg total) by mouth daily.    Magnesium 250 MG TABS Take by mouth. 06/22/2015: Reports taking once a day   metoprolol succinate (TOPROL-XL) 50 MG 24 hr tablet Take 50 mg by mouth daily. Take with or immediately following a meal.    oxyCODONE (OXY IR/ROXICODONE) 5 MG immediate release tablet Take by mouth.    rosuvastatin (CRESTOR) 10 MG tablet Take 10 mg by mouth 2 (two) times a week.    spironolactone (ALDACTONE) 25 MG tablet 25 mg daily.    [DISCONTINUED] Dulaglutide (TRULICITY) [id number] Inject 0.75 mg into the skin once a week.    [DISCONTINUED] Potassium 99 MG TABS Take by mouth. (Patient not taking: Reported on 12/29/2020) 06/16/2015: Takes 2 tablets everyday.   No facility-administered encounter medications on file as of 12/29/2020.    Surgical History: Past Surgical History:  Procedure Laterality Date   ABCESS DRAINAGE  2016   Abdominal abcess due to diverticulitis  caridoverter defibrillator  11/14/2017   ICD   CATARACT EXTRACTION W/PHACO Left 10/26/2015   Procedure: CATARACT EXTRACTION PHACO AND INTRAOCULAR LENS PLACEMENT (IOC) LEFT EYE;  Surgeon: Leandrew Koyanagi, MD;  Location: [place];  Service: Ophthalmology;  Laterality: Left;   CATARACT EXTRACTION W/PHACO Right 12/07/2015   Procedure: CATARACT EXTRACTION PHACO AND INTRAOCULAR LENS PLACEMENT (IOC);  Surgeon: Leandrew Koyanagi, MD;  Location: [facility];  Service: Ophthalmology;  Laterality: Right;   CHOLECYSTECTOMY     FOOT SURGERY     Per patient, bilateral foot surgery.   PELVIC FRACTURE  SURGERY     Per patient for cancer.    Medical History: Past Medical History:  Diagnosis Date   Arthritis    Asthma    Cancer ([place])    Deep vein thrombosis (DVT) ([place]) 2015   Depression    GERD (gastroesophageal reflux disease)    Hypertension    Weakness of both legs     Family History: Family History  Problem Relation Age of Onset   Depression Sister    Heart disease Sister    Early death Sister    Depression Sister    Depression Sister    Depression Sister    Depression Sister     Social History   Socioeconomic History   Marital status: Married    Spouse name: Not on file   Number of children: Not on file   Years of education: Not on file   Highest education level: Not on file  Occupational History   Not on file  Tobacco Use   Smoking status: Never   Smokeless tobacco: Never  Vaping Use   Vaping Use: Never used  Substance and Sexual Activity   Alcohol use: No   Drug use: No   Sexual activity: Not Currently  Other Topics Concern   Not on file  Social History Narrative   Not on file   Social Determinants of Health   Financial Resource Strain: Low Risk    Difficulty of Paying Living Expenses: Not very hard  Food Insecurity: Not on file  Transportation Needs: Not on file  Physical Activity: Not on file  Stress: Not on file  Social Connections: Not on file  Intimate Partner Violence: Not on file      Review of Systems  Constitutional:  Negative for chills, fatigue and unexpected weight change.  HENT:  Positive for postnasal drip. Negative for congestion, rhinorrhea, sneezing and sore throat.   Eyes:  Negative for redness.  Respiratory:  Negative for cough, chest tightness and shortness of breath.   Cardiovascular:  Negative for chest pain and palpitations.  Gastrointestinal:  Negative for abdominal pain, constipation, diarrhea, nausea and vomiting.  Genitourinary:  Negative for dysuria and frequency.  Musculoskeletal:  Negative for arthralgias,  back pain, joint swelling and neck pain.  Skin:  Negative for rash.  Neurological: Negative.  Negative for tremors and numbness.  Hematological:  Negative for adenopathy. Does not bruise/bleed easily.  Psychiatric/Behavioral:  Negative for behavioral problems (Depression), sleep disturbance and suicidal ideas. The patient is not nervous/anxious.    Vital Signs: BP 132/72   Pulse 65   Temp 98.3 F (36.8 C)   Resp 16   Ht 5' (1.524 m)   Wt 253 lb (114.8 kg)   SpO2 97%   BMI 49.41 kg/m    Physical Exam Constitutional:      Appearance: Normal appearance.  HENT:     Head: Normocephalic and atraumatic.  Nose: Nose normal.     Mouth/Throat:     Mouth: Mucous membranes are moist.     Pharynx: No posterior oropharyngeal erythema.  Eyes:     Extraocular Movements: Extraocular movements intact.     Pupils: Pupils are equal, round, and reactive to light.  Cardiovascular:     Pulses: Normal pulses.     Heart sounds: Normal heart sounds.  Pulmonary:     Effort: Pulmonary effort is normal.     Breath sounds: Normal breath sounds.  Neurological:     General: No focal deficit present.     Mental Status: She is alert.  Psychiatric:        Mood and Affect: Mood normal.        Behavior: Behavior normal.    .   Assessment/Plan: 1. Impaired fasting glucose Will Increase Trulicity to 1.5 mg q week  - Dulaglutide (TRULICITY) 1.5 0000000 SOPN; Inject 1.5 mg into the skin once a week.  Dispense: 6 mL; Refill: 3  2. Chronic bilateral low back pain with bilateral sciatica This is given by ortho - oxyCODONE (OXY IR/ROXICODONE) 5 MG immediate release tablet; Take by mouth.  3. BMI 45.0-49.9, adult ([facility]) Obesity Counseling: Risk Assessment: An assessment of behavioral risk factors was made today and includes lack of exercise sedentary lifestyle, lack of portion control and poor dietary habits.  Risk Modification Advice: She was counseled on portion control guidelines. Restricting  daily caloric intake to 1200. The detrimental long term effects of obesity on her health and ongoing poor compliance was also discussed with the patient.     General Counseling: Diana Bernard verbalizes understanding of the findings of todays visit and agrees with plan of treatment. I have discussed any further diagnostic evaluation that may be needed or ordered today. We also reviewed her medications today. she has been encouraged to call the office with any questions or concerns that should arise related to todays visit.    No orders of the defined types were placed in this encounter.   Meds ordered this encounter  Medications   Dulaglutide (TRULICITY) 1.5 0000000 SOPN    Sig: Inject 1.5 mg into the skin once a week.    Dispense:  6 mL    Refill:  3    Total time spent:30 Minutes Time spent includes review of chart, medications, test results, and follow up plan with the patient.   [place] Controlled Substance Database was reviewed by me.   Dr Lavera Guise Internal medicine

## 2021-01-05 DIAGNOSIS — I5022 Chronic systolic (congestive) heart failure: Secondary | ICD-10-CM | POA: Diagnosis not present

## 2021-01-05 DIAGNOSIS — I420 Dilated cardiomyopathy: Secondary | ICD-10-CM | POA: Diagnosis not present

## 2021-01-05 DIAGNOSIS — Z79899 Other long term (current) drug therapy: Secondary | ICD-10-CM | POA: Diagnosis not present

## 2021-01-05 DIAGNOSIS — R0600 Dyspnea, unspecified: Secondary | ICD-10-CM | POA: Diagnosis not present

## 2021-01-05 DIAGNOSIS — C20 Malignant neoplasm of rectum: Secondary | ICD-10-CM | POA: Diagnosis not present

## 2021-01-12 ENCOUNTER — Telehealth: Payer: Self-pay

## 2021-01-12 DIAGNOSIS — I420 Dilated cardiomyopathy: Secondary | ICD-10-CM | POA: Diagnosis not present

## 2021-01-12 DIAGNOSIS — I348 Other nonrheumatic mitral valve disorders: Secondary | ICD-10-CM | POA: Diagnosis not present

## 2021-01-12 DIAGNOSIS — Z01810 Encounter for preprocedural cardiovascular examination: Secondary | ICD-10-CM | POA: Diagnosis not present

## 2021-01-12 DIAGNOSIS — R0600 Dyspnea, unspecified: Secondary | ICD-10-CM | POA: Diagnosis not present

## 2021-01-12 DIAGNOSIS — I5022 Chronic systolic (congestive) heart failure: Secondary | ICD-10-CM | POA: Diagnosis not present

## 2021-01-12 NOTE — Telephone Encounter (Signed)
Trulicity is making her feel sick,  nauseated when she first started, tomorrow she is scheduled to take another dose. 01-25-21 pt is having surgery for cancer and will have a colostomy bag, should pt take a smaller dose of the trulicity? She is unsure if the cancer made her sick or the trulicity.

## 2021-01-13 DIAGNOSIS — Z01818 Encounter for other preprocedural examination: Secondary | ICD-10-CM | POA: Diagnosis not present

## 2021-01-13 DIAGNOSIS — G620 Drug-induced polyneuropathy: Secondary | ICD-10-CM | POA: Diagnosis not present

## 2021-01-13 DIAGNOSIS — R0600 Dyspnea, unspecified: Secondary | ICD-10-CM | POA: Diagnosis not present

## 2021-01-13 DIAGNOSIS — J449 Chronic obstructive pulmonary disease, unspecified: Secondary | ICD-10-CM | POA: Diagnosis not present

## 2021-01-13 DIAGNOSIS — G4730 Sleep apnea, unspecified: Secondary | ICD-10-CM | POA: Diagnosis not present

## 2021-01-13 DIAGNOSIS — Z419 Encounter for procedure for purposes other than remedying health state, unspecified: Secondary | ICD-10-CM | POA: Diagnosis not present

## 2021-01-13 DIAGNOSIS — I10 Essential (primary) hypertension: Secondary | ICD-10-CM | POA: Diagnosis not present

## 2021-01-13 DIAGNOSIS — M5126 Other intervertebral disc displacement, lumbar region: Secondary | ICD-10-CM | POA: Diagnosis not present

## 2021-01-13 DIAGNOSIS — I5022 Chronic systolic (congestive) heart failure: Secondary | ICD-10-CM | POA: Diagnosis not present

## 2021-01-13 DIAGNOSIS — K219 Gastro-esophageal reflux disease without esophagitis: Secondary | ICD-10-CM | POA: Diagnosis not present

## 2021-01-13 DIAGNOSIS — I420 Dilated cardiomyopathy: Secondary | ICD-10-CM | POA: Diagnosis not present

## 2021-01-15 DIAGNOSIS — R9431 Abnormal electrocardiogram [ECG] [EKG]: Secondary | ICD-10-CM | POA: Diagnosis not present

## 2021-01-15 DIAGNOSIS — Z01818 Encounter for other preprocedural examination: Secondary | ICD-10-CM | POA: Diagnosis not present

## 2021-01-17 ENCOUNTER — Telehealth: Payer: Self-pay | Admitting: Pharmacist

## 2021-01-17 NOTE — Telephone Encounter (Signed)
Informed pt she can take it .75 of trulicity, her surgery is next Wednesday 01/25/21, she said she just picked up meds and will call next time she needs a refill

## 2021-01-17 NOTE — Progress Notes (Addendum)
Chronic Care Management Pharmacy Assistant   Name: Diana Bernard  MRN: [id number] DOB: 1945-05-05  Reason for Encounter: Disease State For HTN.    Conditions to be addressed/monitored: HTN, Chronic Systolic HF, Hypothyroidism, Obesity, Depression.  Recent office visits: 01/12/21 (Telephone) CHANGED Trulicity to .75 weekly.  12/29/20 Dr. Humphrey Rolls For follow-up. STOPPED 0.75 my subcutaneous weekly. STARTED 1.5 mg Subcutaneous weekly.   11/24/20 McDonough, Si Gaul, PA-C. For follow-up. STARTED Dulaglutide 0.75 mg subcutaneous weekly and Tramadol 50 every 8 hours PRN.   Recent consult visits:  01/13/21 Pre-Admission Testing Marlene Bast, [address], [place] and [place], [place]. No information given. 01/12/21 Cardiology Marlene Bast No information given.  01/05/21 Surgical Oncology Stitzenberg, Clint Lipps, MD For rectal Carcinoma.  12/28/20 Radiation Oncology Pryor Curia, MD. For rectal cancer.  12/28/20 Marygrace Drought, Annell Greening. For op visit. Per note: PR SIGMOIDOSCOPY W/ENDOSCOPIC US EXAM  SIGMOIDOSCOPY, FLEXIBLE, WITH ENDOSCOPIC ULTRASOUND EXAM. 12/26/20 Surgical Oncology Stitzenberg, Clint Lipps For rectal cancer. 12/21/20 Hematology and Oncology Antoine Poche Vickki Hearing For rectal cancer.  12/19/20 Radiation Oncology Pryor Curia.  12/16/20 12/08/20 Heath Gold For rectal cancer. Per note: Surgery for colonoscopy.  11/28/20 Radiation Oncology Pryor Curia, [place] Hospital visits:  None since 11/23/20  Medications: Outpatient Encounter Medications as of 01/17/2021  Medication Sig Note   albuterol (VENTOLIN HFA) 108 (90 Base) MCG/ACT inhaler Inhale 2 puffs into the lungs every 6 (six) hours as needed for wheezing or shortness of breath.    alendronate (FOSAMAX) 70 MG tablet Take 1 tablet (70 mg total) by mouth every 7 (seven) days. Take with a full glass of water on an empty stomach.    cholecalciferol (VITAMIN D)  1000 units tablet Take 1,000 Units by mouth daily.    Cyanocobalamin (VITAMIN B-12 PO) Take by mouth. Pt takes [place] gummies    diclofenac Sodium (VOLTAREN) 1 % GEL Apply topically 4 (four) times daily.    diphenoxylate-atropine (LOMOTIL) 2.5-0.025 MG tablet Take 1 tablet by mouth 4 (four) times daily as needed for diarrhea or loose stools.    docusate sodium (COLACE) 100 MG capsule Take 200 mg by mouth daily.  06/07/2015: Received from: [place] (TRULICITY) 1.5 FO/2.7XA SOPN Inject 1.5 mg into the skin once a week.    DULoxetine (CYMBALTA) 30 MG capsule Take 1 capsule po bid    esomeprazole (NEXIUM) 20 MG capsule Take 20 mg by mouth daily at 12 noon.    fluticasone-salmeterol (ADVAIR) 250-50 MCG/ACT AEPB Inhale 1 puff into the lungs in the morning and at bedtime.    furosemide (LASIX) 20 MG tablet Take 1 tablet (20 mg total) by mouth daily.    gabapentin (NEURONTIN) 300 MG capsule Take 300 mg by mouth 3 (three) times daily.    ipratropium-albuterol (DUONEB) 0.5-2.5 (3) MG/3ML SOLN Take 3 mLs by nebulization every 4 (four) hours as needed.    lisinopril (ZESTRIL) 20 MG tablet Take 1 tablet (20 mg total) by mouth daily.    Magnesium 250 MG TABS Take by mouth. 06/22/2015: Reports taking once a day   metoprolol succinate (TOPROL-XL) 50 MG 24 hr tablet Take 50 mg by mouth daily. Take with or immediately following a meal.    oxyCODONE (OXY IR/ROXICODONE) 5 MG immediate release tablet Take by mouth.    rosuvastatin (CRESTOR) 10 MG tablet Take 10 mg by mouth 2 (two) times a week.    spironolactone (ALDACTONE) 25 MG tablet 25 mg  daily.    No facility-administered encounter medications on file as of 01/17/2021.   Reviewed chart prior to disease state call. Spoke with patient regarding BP  Recent Office Vitals: BP Readings from Last 3 Encounters:  12/29/20 132/72  11/24/20 136/60  10/24/20 (!) 158/78   Pulse Readings from Last 3 Encounters:  12/29/20 65  11/24/20 67   10/24/20 70    Wt Readings from Last 3 Encounters:  12/29/20 253 lb (114.8 kg)  11/24/20 258 lb (117 kg)  10/24/20 271 lb (122.9 kg)     Kidney Function Lab Results  Component Value Date/Time   CREATININE 1.56 (H) 12/02/2020 08:39 AM   CREATININE 1.15 (H) 04/08/2020 10:16 AM   GFRNONAA 48 (L) 04/08/2020 10:16 AM   GFRAA 55 (L) 04/08/2020 10:16 AM    BMP Latest Ref Rng & Units 12/02/2020 04/08/2020 03/09/2020  Glucose 65 - 99 mg/dL 102(H) 95 103(H)  BUN 8 - 27 mg/dL 30(H) 18 21  Creatinine 0.57 - 1.00 mg/dL 1.56(H) 1.15(H) 1.44(H)  BUN/Creat Ratio 12 - 28 19 16 15   Sodium 134 - 144 mmol/L 136 140 143  Potassium 3.5 - 5.2 mmol/L 4.7 4.4 4.6  Chloride 96 - 106 mmol/L 98 99 99  CO2 20 - 29 mmol/L 23 25 31(H)  Calcium 8.7 - 10.3 mg/dL 10.4(H) 9.7 10.6(H)    Current antihypertensive regimen:  Lisinopril 20mg  Metoprolol XL 50mg  daily  How often are you checking your Blood Pressure? Patient stated daily  Current home BP readings: 01/16/21 140/80  What recent interventions/DTPs have been made by any provider to improve Blood Pressure control since last CPP Visit: None.   Any recent hospitalizations or ED visits since last visit with CPP? Patient stated she has recently had surgeries/procedures done but no hospitalizations or ER visits.   What diet changes have been made to improve Blood Pressure Control?  Patient stated she is not eating as much but when she does eat she stated her foods stay down.  What exercise is being done to improve your Blood Pressure Control?  Patient stated she is being fatigue but she foes do her house duties/ grocery shopping.   Adherence Review: Is the patient currently on ACE/ARB medication? Lisinopril 20 mg  Does the patient have >5 day gap between last estimated fill dates? Per misc rpts, no.   Care Gaps:Patient is due for her mammogram.   Star Rating Drugs:Trulicity 3/71/06 28 DS, Rosuvastatin 10 mg 11/14/20 90 DS, Lisinopril 20 mg 10/03/20 90  DS.  Follow-Up:Pharmacist Review  Charlann Lange, [place] Pharmacist Assistant [phone]

## 2021-01-19 DIAGNOSIS — Z5331 Laparoscopic surgical procedure converted to open procedure: Secondary | ICD-10-CM | POA: Diagnosis not present

## 2021-01-19 DIAGNOSIS — C20 Malignant neoplasm of rectum: Secondary | ICD-10-CM | POA: Diagnosis not present

## 2021-01-19 DIAGNOSIS — Z5339 Other specified procedure converted to open procedure: Secondary | ICD-10-CM | POA: Diagnosis not present

## 2021-01-20 DIAGNOSIS — M109 Gout, unspecified: Secondary | ICD-10-CM | POA: Diagnosis not present

## 2021-01-25 DIAGNOSIS — E039 Hypothyroidism, unspecified: Secondary | ICD-10-CM | POA: Diagnosis not present

## 2021-01-25 DIAGNOSIS — I272 Pulmonary hypertension, unspecified: Secondary | ICD-10-CM | POA: Diagnosis not present

## 2021-01-25 DIAGNOSIS — R140 Abdominal distension (gaseous): Secondary | ICD-10-CM | POA: Diagnosis not present

## 2021-01-25 DIAGNOSIS — Z452 Encounter for adjustment and management of vascular access device: Secondary | ICD-10-CM | POA: Diagnosis not present

## 2021-01-25 DIAGNOSIS — I5042 Chronic combined systolic (congestive) and diastolic (congestive) heart failure: Secondary | ICD-10-CM | POA: Diagnosis not present

## 2021-01-25 DIAGNOSIS — T8131XA Disruption of external operation (surgical) wound, not elsewhere classified, initial encounter: Secondary | ICD-10-CM | POA: Diagnosis not present

## 2021-01-25 DIAGNOSIS — R590 Localized enlarged lymph nodes: Secondary | ICD-10-CM | POA: Diagnosis not present

## 2021-01-25 DIAGNOSIS — Z5331 Laparoscopic surgical procedure converted to open procedure: Secondary | ICD-10-CM | POA: Diagnosis not present

## 2021-01-25 DIAGNOSIS — N179 Acute kidney failure, unspecified: Secondary | ICD-10-CM | POA: Diagnosis not present

## 2021-01-25 DIAGNOSIS — D62 Acute posthemorrhagic anemia: Secondary | ICD-10-CM | POA: Diagnosis not present

## 2021-01-25 DIAGNOSIS — G8918 Other acute postprocedural pain: Secondary | ICD-10-CM | POA: Diagnosis not present

## 2021-01-25 DIAGNOSIS — I517 Cardiomegaly: Secondary | ICD-10-CM | POA: Diagnosis not present

## 2021-01-25 DIAGNOSIS — I420 Dilated cardiomyopathy: Secondary | ICD-10-CM | POA: Diagnosis not present

## 2021-01-25 DIAGNOSIS — C20 Malignant neoplasm of rectum: Secondary | ICD-10-CM | POA: Diagnosis not present

## 2021-01-25 DIAGNOSIS — Y842 Radiological procedure and radiotherapy as the cause of abnormal reaction of the patient, or of later complication, without mention of misadventure at the time of the procedure: Secondary | ICD-10-CM | POA: Diagnosis not present

## 2021-01-25 DIAGNOSIS — K560 Paralytic ileus: Secondary | ICD-10-CM | POA: Diagnosis not present

## 2021-01-25 DIAGNOSIS — R4182 Altered mental status, unspecified: Secondary | ICD-10-CM | POA: Diagnosis not present

## 2021-01-25 DIAGNOSIS — I130 Hypertensive heart and chronic kidney disease with heart failure and stage 1 through stage 4 chronic kidney disease, or unspecified chronic kidney disease: Secondary | ICD-10-CM | POA: Diagnosis not present

## 2021-01-25 DIAGNOSIS — I348 Other nonrheumatic mitral valve disorders: Secondary | ICD-10-CM | POA: Diagnosis not present

## 2021-01-25 DIAGNOSIS — I10 Essential (primary) hypertension: Secondary | ICD-10-CM | POA: Diagnosis not present

## 2021-01-25 DIAGNOSIS — K567 Ileus, unspecified: Secondary | ICD-10-CM | POA: Diagnosis not present

## 2021-01-25 DIAGNOSIS — Z6841 Body Mass Index (BMI) 40.0 and over, adult: Secondary | ICD-10-CM | POA: Diagnosis not present

## 2021-01-25 DIAGNOSIS — C775 Secondary and unspecified malignant neoplasm of intrapelvic lymph nodes: Secondary | ICD-10-CM | POA: Diagnosis not present

## 2021-01-25 DIAGNOSIS — D012 Carcinoma in situ of rectum: Secondary | ICD-10-CM | POA: Diagnosis not present

## 2021-01-25 DIAGNOSIS — R110 Nausea: Secondary | ICD-10-CM | POA: Diagnosis not present

## 2021-01-25 DIAGNOSIS — I428 Other cardiomyopathies: Secondary | ICD-10-CM | POA: Diagnosis not present

## 2021-01-25 DIAGNOSIS — Z978 Presence of other specified devices: Secondary | ICD-10-CM | POA: Diagnosis not present

## 2021-01-25 DIAGNOSIS — I5022 Chronic systolic (congestive) heart failure: Secondary | ICD-10-CM | POA: Diagnosis not present

## 2021-01-25 HISTORY — PX: OSTOMY: SHX5997

## 2021-01-26 DIAGNOSIS — R4182 Altered mental status, unspecified: Secondary | ICD-10-CM | POA: Diagnosis not present

## 2021-01-27 DIAGNOSIS — I517 Cardiomegaly: Secondary | ICD-10-CM | POA: Diagnosis not present

## 2021-01-27 DIAGNOSIS — I348 Other nonrheumatic mitral valve disorders: Secondary | ICD-10-CM | POA: Diagnosis not present

## 2021-01-27 DIAGNOSIS — I5022 Chronic systolic (congestive) heart failure: Secondary | ICD-10-CM | POA: Diagnosis not present

## 2021-01-27 DIAGNOSIS — E039 Hypothyroidism, unspecified: Secondary | ICD-10-CM | POA: Diagnosis not present

## 2021-01-27 DIAGNOSIS — C20 Malignant neoplasm of rectum: Secondary | ICD-10-CM | POA: Diagnosis not present

## 2021-01-27 DIAGNOSIS — I272 Pulmonary hypertension, unspecified: Secondary | ICD-10-CM | POA: Diagnosis not present

## 2021-01-27 DIAGNOSIS — I10 Essential (primary) hypertension: Secondary | ICD-10-CM | POA: Diagnosis not present

## 2021-01-29 DIAGNOSIS — Z978 Presence of other specified devices: Secondary | ICD-10-CM | POA: Diagnosis not present

## 2021-01-29 DIAGNOSIS — R140 Abdominal distension (gaseous): Secondary | ICD-10-CM | POA: Diagnosis not present

## 2021-01-29 DIAGNOSIS — R110 Nausea: Secondary | ICD-10-CM | POA: Diagnosis not present

## 2021-02-01 DIAGNOSIS — Z978 Presence of other specified devices: Secondary | ICD-10-CM | POA: Diagnosis not present

## 2021-02-02 DIAGNOSIS — K560 Paralytic ileus: Secondary | ICD-10-CM | POA: Diagnosis not present

## 2021-02-03 DIAGNOSIS — K567 Ileus, unspecified: Secondary | ICD-10-CM | POA: Diagnosis not present

## 2021-02-03 DIAGNOSIS — R590 Localized enlarged lymph nodes: Secondary | ICD-10-CM | POA: Diagnosis not present

## 2021-02-05 DIAGNOSIS — C20 Malignant neoplasm of rectum: Secondary | ICD-10-CM | POA: Diagnosis not present

## 2021-02-06 DIAGNOSIS — C20 Malignant neoplasm of rectum: Secondary | ICD-10-CM | POA: Diagnosis not present

## 2021-02-08 DIAGNOSIS — D012 Carcinoma in situ of rectum: Secondary | ICD-10-CM | POA: Diagnosis not present

## 2021-02-09 DIAGNOSIS — I447 Left bundle-branch block, unspecified: Secondary | ICD-10-CM | POA: Diagnosis not present

## 2021-02-09 DIAGNOSIS — Z4801 Encounter for change or removal of surgical wound dressing: Secondary | ICD-10-CM | POA: Diagnosis not present

## 2021-02-09 DIAGNOSIS — I420 Dilated cardiomyopathy: Secondary | ICD-10-CM | POA: Diagnosis not present

## 2021-02-09 DIAGNOSIS — K9409 Other complications of colostomy: Secondary | ICD-10-CM | POA: Diagnosis not present

## 2021-02-09 DIAGNOSIS — M1030 Gout due to renal impairment, unspecified site: Secondary | ICD-10-CM | POA: Diagnosis not present

## 2021-02-09 DIAGNOSIS — I5022 Chronic systolic (congestive) heart failure: Secondary | ICD-10-CM | POA: Diagnosis not present

## 2021-02-09 DIAGNOSIS — N1831 Chronic kidney disease, stage 3a: Secondary | ICD-10-CM | POA: Diagnosis not present

## 2021-02-09 DIAGNOSIS — I130 Hypertensive heart and chronic kidney disease with heart failure and stage 1 through stage 4 chronic kidney disease, or unspecified chronic kidney disease: Secondary | ICD-10-CM | POA: Diagnosis not present

## 2021-02-09 DIAGNOSIS — C20 Malignant neoplasm of rectum: Secondary | ICD-10-CM | POA: Diagnosis not present

## 2021-02-10 DIAGNOSIS — I447 Left bundle-branch block, unspecified: Secondary | ICD-10-CM | POA: Diagnosis not present

## 2021-02-10 DIAGNOSIS — C20 Malignant neoplasm of rectum: Secondary | ICD-10-CM | POA: Diagnosis not present

## 2021-02-10 DIAGNOSIS — K9409 Other complications of colostomy: Secondary | ICD-10-CM | POA: Diagnosis not present

## 2021-02-10 DIAGNOSIS — Z4801 Encounter for change or removal of surgical wound dressing: Secondary | ICD-10-CM | POA: Diagnosis not present

## 2021-02-10 DIAGNOSIS — N1831 Chronic kidney disease, stage 3a: Secondary | ICD-10-CM | POA: Diagnosis not present

## 2021-02-10 DIAGNOSIS — I130 Hypertensive heart and chronic kidney disease with heart failure and stage 1 through stage 4 chronic kidney disease, or unspecified chronic kidney disease: Secondary | ICD-10-CM | POA: Diagnosis not present

## 2021-02-10 DIAGNOSIS — M1030 Gout due to renal impairment, unspecified site: Secondary | ICD-10-CM | POA: Diagnosis not present

## 2021-02-10 DIAGNOSIS — I5022 Chronic systolic (congestive) heart failure: Secondary | ICD-10-CM | POA: Diagnosis not present

## 2021-02-10 DIAGNOSIS — I420 Dilated cardiomyopathy: Secondary | ICD-10-CM | POA: Diagnosis not present

## 2021-02-11 DIAGNOSIS — M1030 Gout due to renal impairment, unspecified site: Secondary | ICD-10-CM | POA: Diagnosis not present

## 2021-02-11 DIAGNOSIS — N1831 Chronic kidney disease, stage 3a: Secondary | ICD-10-CM | POA: Diagnosis not present

## 2021-02-11 DIAGNOSIS — R918 Other nonspecific abnormal finding of lung field: Secondary | ICD-10-CM | POA: Diagnosis not present

## 2021-02-11 DIAGNOSIS — I5022 Chronic systolic (congestive) heart failure: Secondary | ICD-10-CM | POA: Diagnosis not present

## 2021-02-11 DIAGNOSIS — I447 Left bundle-branch block, unspecified: Secondary | ICD-10-CM | POA: Diagnosis not present

## 2021-02-11 DIAGNOSIS — Z4801 Encounter for change or removal of surgical wound dressing: Secondary | ICD-10-CM | POA: Diagnosis not present

## 2021-02-11 DIAGNOSIS — R531 Weakness: Secondary | ICD-10-CM | POA: Diagnosis not present

## 2021-02-11 DIAGNOSIS — M6281 Muscle weakness (generalized): Secondary | ICD-10-CM | POA: Diagnosis not present

## 2021-02-11 DIAGNOSIS — R2681 Unsteadiness on feet: Secondary | ICD-10-CM | POA: Diagnosis not present

## 2021-02-11 DIAGNOSIS — R5381 Other malaise: Secondary | ICD-10-CM | POA: Diagnosis not present

## 2021-02-11 DIAGNOSIS — I130 Hypertensive heart and chronic kidney disease with heart failure and stage 1 through stage 4 chronic kidney disease, or unspecified chronic kidney disease: Secondary | ICD-10-CM | POA: Diagnosis not present

## 2021-02-11 DIAGNOSIS — T8141XA Infection following a procedure, superficial incisional surgical site, initial encounter: Secondary | ICD-10-CM | POA: Diagnosis not present

## 2021-02-11 DIAGNOSIS — J449 Chronic obstructive pulmonary disease, unspecified: Secondary | ICD-10-CM | POA: Diagnosis not present

## 2021-02-11 DIAGNOSIS — N189 Chronic kidney disease, unspecified: Secondary | ICD-10-CM | POA: Diagnosis not present

## 2021-02-11 DIAGNOSIS — R509 Fever, unspecified: Secondary | ICD-10-CM | POA: Diagnosis not present

## 2021-02-11 DIAGNOSIS — K6389 Other specified diseases of intestine: Secondary | ICD-10-CM | POA: Diagnosis not present

## 2021-02-11 DIAGNOSIS — Z741 Need for assistance with personal care: Secondary | ICD-10-CM | POA: Diagnosis not present

## 2021-02-11 DIAGNOSIS — I10 Essential (primary) hypertension: Secondary | ICD-10-CM | POA: Diagnosis not present

## 2021-02-11 DIAGNOSIS — J90 Pleural effusion, not elsewhere classified: Secondary | ICD-10-CM | POA: Diagnosis not present

## 2021-02-11 DIAGNOSIS — E440 Moderate protein-calorie malnutrition: Secondary | ICD-10-CM | POA: Diagnosis not present

## 2021-02-11 DIAGNOSIS — R109 Unspecified abdominal pain: Secondary | ICD-10-CM | POA: Diagnosis not present

## 2021-02-11 DIAGNOSIS — R0902 Hypoxemia: Secondary | ICD-10-CM | POA: Diagnosis not present

## 2021-02-11 DIAGNOSIS — C20 Malignant neoplasm of rectum: Secondary | ICD-10-CM | POA: Diagnosis not present

## 2021-02-11 DIAGNOSIS — K9400 Colostomy complication, unspecified: Secondary | ICD-10-CM | POA: Diagnosis not present

## 2021-02-11 DIAGNOSIS — J982 Interstitial emphysema: Secondary | ICD-10-CM | POA: Diagnosis not present

## 2021-02-11 DIAGNOSIS — K5989 Other specified functional intestinal disorders: Secondary | ICD-10-CM | POA: Diagnosis not present

## 2021-02-11 DIAGNOSIS — J9811 Atelectasis: Secondary | ICD-10-CM | POA: Diagnosis not present

## 2021-02-11 DIAGNOSIS — J189 Pneumonia, unspecified organism: Secondary | ICD-10-CM | POA: Diagnosis not present

## 2021-02-11 DIAGNOSIS — K651 Peritoneal abscess: Secondary | ICD-10-CM | POA: Diagnosis not present

## 2021-02-11 DIAGNOSIS — Z20822 Contact with and (suspected) exposure to covid-19: Secondary | ICD-10-CM | POA: Diagnosis not present

## 2021-02-11 DIAGNOSIS — K9409 Other complications of colostomy: Secondary | ICD-10-CM | POA: Diagnosis not present

## 2021-02-11 DIAGNOSIS — R339 Retention of urine, unspecified: Secondary | ICD-10-CM | POA: Diagnosis not present

## 2021-02-11 DIAGNOSIS — I420 Dilated cardiomyopathy: Secondary | ICD-10-CM | POA: Diagnosis not present

## 2021-02-11 DIAGNOSIS — K9403 Colostomy malfunction: Secondary | ICD-10-CM | POA: Diagnosis not present

## 2021-02-11 DIAGNOSIS — R279 Unspecified lack of coordination: Secondary | ICD-10-CM | POA: Diagnosis not present

## 2021-02-11 DIAGNOSIS — M6289 Other specified disorders of muscle: Secondary | ICD-10-CM | POA: Diagnosis not present

## 2021-02-11 DIAGNOSIS — N179 Acute kidney failure, unspecified: Secondary | ICD-10-CM | POA: Diagnosis not present

## 2021-02-11 HISTORY — PX: OSTOMY: SHX5997

## 2021-02-13 ENCOUNTER — Inpatient Hospital Stay: Payer: Medicare HMO | Admitting: Physician Assistant

## 2021-02-13 DIAGNOSIS — R339 Retention of urine, unspecified: Secondary | ICD-10-CM | POA: Diagnosis not present

## 2021-02-14 DIAGNOSIS — K651 Peritoneal abscess: Secondary | ICD-10-CM | POA: Diagnosis not present

## 2021-02-14 DIAGNOSIS — K9409 Other complications of colostomy: Secondary | ICD-10-CM | POA: Diagnosis not present

## 2021-02-14 DIAGNOSIS — K6389 Other specified diseases of intestine: Secondary | ICD-10-CM | POA: Diagnosis not present

## 2021-02-18 DIAGNOSIS — J189 Pneumonia, unspecified organism: Secondary | ICD-10-CM | POA: Diagnosis not present

## 2021-02-18 DIAGNOSIS — J9811 Atelectasis: Secondary | ICD-10-CM | POA: Diagnosis not present

## 2021-02-19 DIAGNOSIS — R509 Fever, unspecified: Secondary | ICD-10-CM | POA: Diagnosis not present

## 2021-02-20 DIAGNOSIS — J189 Pneumonia, unspecified organism: Secondary | ICD-10-CM | POA: Diagnosis not present

## 2021-02-20 DIAGNOSIS — J90 Pleural effusion, not elsewhere classified: Secondary | ICD-10-CM | POA: Diagnosis not present

## 2021-02-20 DIAGNOSIS — J9811 Atelectasis: Secondary | ICD-10-CM | POA: Diagnosis not present

## 2021-02-21 DIAGNOSIS — I5022 Chronic systolic (congestive) heart failure: Secondary | ICD-10-CM | POA: Diagnosis not present

## 2021-02-21 DIAGNOSIS — K9409 Other complications of colostomy: Secondary | ICD-10-CM | POA: Diagnosis not present

## 2021-02-21 DIAGNOSIS — M1030 Gout due to renal impairment, unspecified site: Secondary | ICD-10-CM | POA: Diagnosis not present

## 2021-02-21 DIAGNOSIS — I130 Hypertensive heart and chronic kidney disease with heart failure and stage 1 through stage 4 chronic kidney disease, or unspecified chronic kidney disease: Secondary | ICD-10-CM | POA: Diagnosis not present

## 2021-02-21 DIAGNOSIS — C20 Malignant neoplasm of rectum: Secondary | ICD-10-CM | POA: Diagnosis not present

## 2021-02-21 DIAGNOSIS — I420 Dilated cardiomyopathy: Secondary | ICD-10-CM | POA: Diagnosis not present

## 2021-02-21 DIAGNOSIS — I447 Left bundle-branch block, unspecified: Secondary | ICD-10-CM | POA: Diagnosis not present

## 2021-02-21 DIAGNOSIS — N1831 Chronic kidney disease, stage 3a: Secondary | ICD-10-CM | POA: Diagnosis not present

## 2021-02-21 DIAGNOSIS — Z4801 Encounter for change or removal of surgical wound dressing: Secondary | ICD-10-CM | POA: Diagnosis not present

## 2021-02-23 DIAGNOSIS — R918 Other nonspecific abnormal finding of lung field: Secondary | ICD-10-CM | POA: Diagnosis not present

## 2021-02-23 DIAGNOSIS — R509 Fever, unspecified: Secondary | ICD-10-CM | POA: Diagnosis not present

## 2021-02-24 DIAGNOSIS — Z20822 Contact with and (suspected) exposure to covid-19: Secondary | ICD-10-CM | POA: Diagnosis not present

## 2021-02-24 DIAGNOSIS — E1140 Type 2 diabetes mellitus with diabetic neuropathy, unspecified: Secondary | ICD-10-CM | POA: Diagnosis not present

## 2021-02-24 DIAGNOSIS — I130 Hypertensive heart and chronic kidney disease with heart failure and stage 1 through stage 4 chronic kidney disease, or unspecified chronic kidney disease: Secondary | ICD-10-CM | POA: Diagnosis not present

## 2021-02-24 DIAGNOSIS — K2101 Gastro-esophageal reflux disease with esophagitis, with bleeding: Secondary | ICD-10-CM | POA: Diagnosis not present

## 2021-02-24 DIAGNOSIS — R109 Unspecified abdominal pain: Secondary | ICD-10-CM | POA: Diagnosis not present

## 2021-02-24 DIAGNOSIS — D649 Anemia, unspecified: Secondary | ICD-10-CM | POA: Diagnosis not present

## 2021-02-24 DIAGNOSIS — Z933 Colostomy status: Secondary | ICD-10-CM | POA: Diagnosis not present

## 2021-02-24 DIAGNOSIS — R110 Nausea: Secondary | ICD-10-CM | POA: Diagnosis not present

## 2021-02-24 DIAGNOSIS — R279 Unspecified lack of coordination: Secondary | ICD-10-CM | POA: Diagnosis not present

## 2021-02-24 DIAGNOSIS — I482 Chronic atrial fibrillation, unspecified: Secondary | ICD-10-CM | POA: Diagnosis not present

## 2021-02-24 DIAGNOSIS — R54 Age-related physical debility: Secondary | ICD-10-CM | POA: Diagnosis not present

## 2021-02-24 DIAGNOSIS — Z6841 Body Mass Index (BMI) 40.0 and over, adult: Secondary | ICD-10-CM | POA: Diagnosis not present

## 2021-02-24 DIAGNOSIS — R509 Fever, unspecified: Secondary | ICD-10-CM | POA: Diagnosis not present

## 2021-02-24 DIAGNOSIS — K9409 Other complications of colostomy: Secondary | ICD-10-CM | POA: Diagnosis not present

## 2021-02-24 DIAGNOSIS — E43 Unspecified severe protein-calorie malnutrition: Secondary | ICD-10-CM | POA: Diagnosis not present

## 2021-02-24 DIAGNOSIS — N390 Urinary tract infection, site not specified: Secondary | ICD-10-CM | POA: Diagnosis not present

## 2021-02-24 DIAGNOSIS — M6281 Muscle weakness (generalized): Secondary | ICD-10-CM | POA: Diagnosis not present

## 2021-02-24 DIAGNOSIS — L02211 Cutaneous abscess of abdominal wall: Secondary | ICD-10-CM | POA: Diagnosis not present

## 2021-02-24 DIAGNOSIS — E119 Type 2 diabetes mellitus without complications: Secondary | ICD-10-CM | POA: Diagnosis not present

## 2021-02-24 DIAGNOSIS — R61 Generalized hyperhidrosis: Secondary | ICD-10-CM | POA: Diagnosis not present

## 2021-02-24 DIAGNOSIS — J449 Chronic obstructive pulmonary disease, unspecified: Secondary | ICD-10-CM | POA: Diagnosis not present

## 2021-02-24 DIAGNOSIS — R2681 Unsteadiness on feet: Secondary | ICD-10-CM | POA: Diagnosis not present

## 2021-02-24 DIAGNOSIS — Z741 Need for assistance with personal care: Secondary | ICD-10-CM | POA: Diagnosis not present

## 2021-02-24 DIAGNOSIS — G8929 Other chronic pain: Secondary | ICD-10-CM | POA: Diagnosis not present

## 2021-02-24 DIAGNOSIS — F339 Major depressive disorder, recurrent, unspecified: Secondary | ICD-10-CM | POA: Diagnosis not present

## 2021-02-24 DIAGNOSIS — R5381 Other malaise: Secondary | ICD-10-CM | POA: Diagnosis not present

## 2021-02-24 DIAGNOSIS — C20 Malignant neoplasm of rectum: Secondary | ICD-10-CM | POA: Diagnosis not present

## 2021-02-24 DIAGNOSIS — N179 Acute kidney failure, unspecified: Secondary | ICD-10-CM | POA: Diagnosis not present

## 2021-02-24 DIAGNOSIS — M6289 Other specified disorders of muscle: Secondary | ICD-10-CM | POA: Diagnosis not present

## 2021-02-24 DIAGNOSIS — R0602 Shortness of breath: Secondary | ICD-10-CM | POA: Diagnosis not present

## 2021-02-24 DIAGNOSIS — F419 Anxiety disorder, unspecified: Secondary | ICD-10-CM | POA: Diagnosis not present

## 2021-02-24 DIAGNOSIS — G894 Chronic pain syndrome: Secondary | ICD-10-CM | POA: Diagnosis not present

## 2021-02-24 DIAGNOSIS — R531 Weakness: Secondary | ICD-10-CM | POA: Diagnosis not present

## 2021-02-24 DIAGNOSIS — K9400 Colostomy complication, unspecified: Secondary | ICD-10-CM | POA: Diagnosis not present

## 2021-02-24 DIAGNOSIS — N189 Chronic kidney disease, unspecified: Secondary | ICD-10-CM | POA: Diagnosis not present

## 2021-02-24 DIAGNOSIS — S31104A Unspecified open wound of abdominal wall, left lower quadrant without penetration into peritoneal cavity, initial encounter: Secondary | ICD-10-CM | POA: Diagnosis not present

## 2021-02-24 DIAGNOSIS — E785 Hyperlipidemia, unspecified: Secondary | ICD-10-CM | POA: Diagnosis not present

## 2021-02-24 DIAGNOSIS — I5022 Chronic systolic (congestive) heart failure: Secondary | ICD-10-CM | POA: Diagnosis not present

## 2021-02-24 DIAGNOSIS — R339 Retention of urine, unspecified: Secondary | ICD-10-CM | POA: Diagnosis not present

## 2021-02-24 DIAGNOSIS — R5082 Postprocedural fever: Secondary | ICD-10-CM | POA: Diagnosis not present

## 2021-02-24 DIAGNOSIS — I10 Essential (primary) hypertension: Secondary | ICD-10-CM | POA: Diagnosis not present

## 2021-02-27 DIAGNOSIS — E119 Type 2 diabetes mellitus without complications: Secondary | ICD-10-CM | POA: Diagnosis not present

## 2021-02-27 DIAGNOSIS — K9400 Colostomy complication, unspecified: Secondary | ICD-10-CM | POA: Diagnosis not present

## 2021-02-27 DIAGNOSIS — I10 Essential (primary) hypertension: Secondary | ICD-10-CM | POA: Diagnosis not present

## 2021-02-27 DIAGNOSIS — G8929 Other chronic pain: Secondary | ICD-10-CM | POA: Diagnosis not present

## 2021-02-27 DIAGNOSIS — M6281 Muscle weakness (generalized): Secondary | ICD-10-CM | POA: Diagnosis not present

## 2021-02-27 DIAGNOSIS — C20 Malignant neoplasm of rectum: Secondary | ICD-10-CM | POA: Diagnosis not present

## 2021-02-27 DIAGNOSIS — J449 Chronic obstructive pulmonary disease, unspecified: Secondary | ICD-10-CM | POA: Diagnosis not present

## 2021-02-27 DIAGNOSIS — F419 Anxiety disorder, unspecified: Secondary | ICD-10-CM | POA: Diagnosis not present

## 2021-02-28 DIAGNOSIS — K2101 Gastro-esophageal reflux disease with esophagitis, with bleeding: Secondary | ICD-10-CM | POA: Diagnosis not present

## 2021-02-28 DIAGNOSIS — G894 Chronic pain syndrome: Secondary | ICD-10-CM | POA: Diagnosis not present

## 2021-02-28 DIAGNOSIS — F339 Major depressive disorder, recurrent, unspecified: Secondary | ICD-10-CM | POA: Diagnosis not present

## 2021-02-28 DIAGNOSIS — E1140 Type 2 diabetes mellitus with diabetic neuropathy, unspecified: Secondary | ICD-10-CM | POA: Diagnosis not present

## 2021-02-28 DIAGNOSIS — I482 Chronic atrial fibrillation, unspecified: Secondary | ICD-10-CM | POA: Diagnosis not present

## 2021-02-28 DIAGNOSIS — D649 Anemia, unspecified: Secondary | ICD-10-CM | POA: Diagnosis not present

## 2021-02-28 DIAGNOSIS — E785 Hyperlipidemia, unspecified: Secondary | ICD-10-CM | POA: Diagnosis not present

## 2021-02-28 DIAGNOSIS — I10 Essential (primary) hypertension: Secondary | ICD-10-CM | POA: Diagnosis not present

## 2021-02-28 DIAGNOSIS — I130 Hypertensive heart and chronic kidney disease with heart failure and stage 1 through stage 4 chronic kidney disease, or unspecified chronic kidney disease: Secondary | ICD-10-CM | POA: Diagnosis not present

## 2021-02-28 DIAGNOSIS — R54 Age-related physical debility: Secondary | ICD-10-CM | POA: Diagnosis not present

## 2021-03-02 DIAGNOSIS — E785 Hyperlipidemia, unspecified: Secondary | ICD-10-CM | POA: Diagnosis not present

## 2021-03-02 DIAGNOSIS — C20 Malignant neoplasm of rectum: Secondary | ICD-10-CM | POA: Diagnosis not present

## 2021-03-02 DIAGNOSIS — I130 Hypertensive heart and chronic kidney disease with heart failure and stage 1 through stage 4 chronic kidney disease, or unspecified chronic kidney disease: Secondary | ICD-10-CM | POA: Diagnosis not present

## 2021-03-02 DIAGNOSIS — E1140 Type 2 diabetes mellitus with diabetic neuropathy, unspecified: Secondary | ICD-10-CM | POA: Diagnosis not present

## 2021-03-02 DIAGNOSIS — Z933 Colostomy status: Secondary | ICD-10-CM | POA: Diagnosis not present

## 2021-03-04 DIAGNOSIS — R5082 Postprocedural fever: Secondary | ICD-10-CM | POA: Diagnosis not present

## 2021-03-04 DIAGNOSIS — S31109A Unspecified open wound of abdominal wall, unspecified quadrant without penetration into peritoneal cavity, initial encounter: Secondary | ICD-10-CM | POA: Diagnosis not present

## 2021-03-04 DIAGNOSIS — F4389 Other reactions to severe stress: Secondary | ICD-10-CM | POA: Diagnosis not present

## 2021-03-04 DIAGNOSIS — L98499 Non-pressure chronic ulcer of skin of other sites with unspecified severity: Secondary | ICD-10-CM | POA: Diagnosis not present

## 2021-03-04 DIAGNOSIS — R279 Unspecified lack of coordination: Secondary | ICD-10-CM | POA: Diagnosis not present

## 2021-03-04 DIAGNOSIS — N390 Urinary tract infection, site not specified: Secondary | ICD-10-CM | POA: Diagnosis not present

## 2021-03-04 DIAGNOSIS — R079 Chest pain, unspecified: Secondary | ICD-10-CM | POA: Diagnosis not present

## 2021-03-04 DIAGNOSIS — S31104A Unspecified open wound of abdominal wall, left lower quadrant without penetration into peritoneal cavity, initial encounter: Secondary | ICD-10-CM | POA: Diagnosis not present

## 2021-03-04 DIAGNOSIS — A499 Bacterial infection, unspecified: Secondary | ICD-10-CM | POA: Diagnosis not present

## 2021-03-04 DIAGNOSIS — E43 Unspecified severe protein-calorie malnutrition: Secondary | ICD-10-CM | POA: Diagnosis not present

## 2021-03-04 DIAGNOSIS — G479 Sleep disorder, unspecified: Secondary | ICD-10-CM | POA: Diagnosis not present

## 2021-03-04 DIAGNOSIS — G7281 Critical illness myopathy: Secondary | ICD-10-CM | POA: Diagnosis not present

## 2021-03-04 DIAGNOSIS — R0602 Shortness of breath: Secondary | ICD-10-CM | POA: Diagnosis not present

## 2021-03-04 DIAGNOSIS — R61 Generalized hyperhidrosis: Secondary | ICD-10-CM | POA: Diagnosis not present

## 2021-03-04 DIAGNOSIS — J449 Chronic obstructive pulmonary disease, unspecified: Secondary | ICD-10-CM | POA: Diagnosis not present

## 2021-03-04 DIAGNOSIS — I130 Hypertensive heart and chronic kidney disease with heart failure and stage 1 through stage 4 chronic kidney disease, or unspecified chronic kidney disease: Secondary | ICD-10-CM | POA: Diagnosis not present

## 2021-03-04 DIAGNOSIS — R5381 Other malaise: Secondary | ICD-10-CM | POA: Diagnosis not present

## 2021-03-04 DIAGNOSIS — N189 Chronic kidney disease, unspecified: Secondary | ICD-10-CM | POA: Diagnosis not present

## 2021-03-04 DIAGNOSIS — I5022 Chronic systolic (congestive) heart failure: Secondary | ICD-10-CM | POA: Diagnosis not present

## 2021-03-04 DIAGNOSIS — Z950 Presence of cardiac pacemaker: Secondary | ICD-10-CM | POA: Diagnosis not present

## 2021-03-04 DIAGNOSIS — F32A Depression, unspecified: Secondary | ICD-10-CM | POA: Diagnosis not present

## 2021-03-04 DIAGNOSIS — R110 Nausea: Secondary | ICD-10-CM | POA: Diagnosis not present

## 2021-03-04 DIAGNOSIS — R627 Adult failure to thrive: Secondary | ICD-10-CM | POA: Diagnosis not present

## 2021-03-04 DIAGNOSIS — R109 Unspecified abdominal pain: Secondary | ICD-10-CM | POA: Diagnosis not present

## 2021-03-04 DIAGNOSIS — K9409 Other complications of colostomy: Secondary | ICD-10-CM | POA: Diagnosis not present

## 2021-03-04 DIAGNOSIS — C20 Malignant neoplasm of rectum: Secondary | ICD-10-CM | POA: Diagnosis not present

## 2021-03-04 DIAGNOSIS — I10 Essential (primary) hypertension: Secondary | ICD-10-CM | POA: Diagnosis not present

## 2021-03-04 DIAGNOSIS — Z20822 Contact with and (suspected) exposure to covid-19: Secondary | ICD-10-CM | POA: Diagnosis not present

## 2021-03-04 DIAGNOSIS — R509 Fever, unspecified: Secondary | ICD-10-CM | POA: Diagnosis not present

## 2021-03-04 DIAGNOSIS — R531 Weakness: Secondary | ICD-10-CM | POA: Diagnosis not present

## 2021-03-04 DIAGNOSIS — M6281 Muscle weakness (generalized): Secondary | ICD-10-CM | POA: Diagnosis not present

## 2021-03-04 DIAGNOSIS — Z6841 Body Mass Index (BMI) 40.0 and over, adult: Secondary | ICD-10-CM | POA: Diagnosis not present

## 2021-03-04 DIAGNOSIS — L02211 Cutaneous abscess of abdominal wall: Secondary | ICD-10-CM | POA: Diagnosis not present

## 2021-03-04 DIAGNOSIS — D649 Anemia, unspecified: Secondary | ICD-10-CM | POA: Diagnosis not present

## 2021-03-05 DIAGNOSIS — R079 Chest pain, unspecified: Secondary | ICD-10-CM | POA: Diagnosis not present

## 2021-03-07 DIAGNOSIS — R5082 Postprocedural fever: Secondary | ICD-10-CM | POA: Diagnosis not present

## 2021-03-08 ENCOUNTER — Telehealth: Payer: Self-pay | Admitting: Student-PharmD

## 2021-03-08 NOTE — Progress Notes (Addendum)
Chronic Care Management Pharmacy Assistant   Name: Diana Bernard  MRN: [id number] DOB: 1945/10/08  Reason for Encounter: Disease State For HTN.    Conditions to be addressed/monitored: HTN, Chronic Systolic HF, Hypothyroidism, Obesity, Depression.  Recent office visits:  None since last CCM Call on 01/17/21  Recent consult visits:  01/19/21 Surgery Oncology Stitzenberg, Clint Lipps, [place] [facility] visits:  02/11/21 Surgical Ward Stitzenberg, Clint Lipps, MD For Urinary Retention. No medication changes. 01/25/21 Surgical Ward Stitzenberg, Clint Lipps, MD For Rectal carcinoma  STOPPED Docusate, Polyethylene.  01/23/21 Urgent Care Gearldine Shown, MD For COVID test.   Medications: Outpatient Encounter Medications as of 03/08/2021  Medication Sig Note   albuterol (VENTOLIN HFA) 108 (90 Base) MCG/ACT inhaler Inhale 2 puffs into the lungs every 6 (six) hours as needed for wheezing or shortness of breath.    alendronate (FOSAMAX) 70 MG tablet Take 1 tablet (70 mg total) by mouth every 7 (seven) days. Take with a full glass of water on an empty stomach.    cholecalciferol (VITAMIN D) 1000 units tablet Take 1,000 Units by mouth daily.    Cyanocobalamin (VITAMIN B-12 PO) Take by mouth. Pt takes [place] gummies    diclofenac Sodium (VOLTAREN) 1 % GEL Apply topically 4 (four) times daily.    diphenoxylate-atropine (LOMOTIL) 2.5-0.025 MG tablet Take 1 tablet by mouth 4 (four) times daily as needed for diarrhea or loose stools.    docusate sodium (COLACE) 100 MG capsule Take 200 mg by mouth daily.  06/07/2015: Received from: [place] (TRULICITY) 1.5 WG/9.5AO SOPN Inject 1.5 mg into the skin once a week.    DULoxetine (CYMBALTA) 30 MG capsule Take 1 capsule po bid    esomeprazole (NEXIUM) 20 MG capsule Take 20 mg by mouth daily at 12 noon.    fluticasone-salmeterol (ADVAIR) 250-50 MCG/ACT AEPB Inhale 1 puff into the lungs in the morning and at bedtime.    furosemide  (LASIX) 20 MG tablet Take 1 tablet (20 mg total) by mouth daily.    gabapentin (NEURONTIN) 300 MG capsule Take 300 mg by mouth 3 (three) times daily.    ipratropium-albuterol (DUONEB) 0.5-2.5 (3) MG/3ML SOLN Take 3 mLs by nebulization every 4 (four) hours as needed.    lisinopril (ZESTRIL) 20 MG tablet Take 1 tablet (20 mg total) by mouth daily.    Magnesium 250 MG TABS Take by mouth. 06/22/2015: Reports taking once a day   metoprolol succinate (TOPROL-XL) 50 MG 24 hr tablet Take 50 mg by mouth daily. Take with or immediately following a meal.    oxyCODONE (OXY IR/ROXICODONE) 5 MG immediate release tablet Take by mouth.    rosuvastatin (CRESTOR) 10 MG tablet Take 10 mg by mouth 2 (two) times a week.    spironolactone (ALDACTONE) 25 MG tablet 25 mg daily.    No facility-administered encounter medications on file as of 03/08/2021.   Reviewed chart prior to disease state call. Spoke with patient regarding BP  Recent Office Vitals: BP Readings from Last 3 Encounters:  12/29/20 132/72  11/24/20 136/60  10/24/20 (!) 158/78   Pulse Readings from Last 3 Encounters:  12/29/20 65  11/24/20 67  10/24/20 70    Wt Readings from Last 3 Encounters:  12/29/20 253 lb (114.8 kg)  11/24/20 258 lb (117 kg)  10/24/20 271 lb (122.9 kg)     Kidney Function Lab Results  Component Value Date/Time   CREATININE 1.56 (H) 12/02/2020 08:39  AM   CREATININE 1.15 (H) 04/08/2020 10:16 AM   GFRNONAA 48 (L) 04/08/2020 10:16 AM   GFRAA 55 (L) 04/08/2020 10:16 AM    BMP Latest Ref Rng & Units 12/02/2020 04/08/2020 03/09/2020  Glucose 65 - 99 mg/dL 102(H) 95 103(H)  BUN 8 - 27 mg/dL 30(H) 18 21  Creatinine 0.57 - 1.00 mg/dL 1.56(H) 1.15(H) 1.44(H)  BUN/Creat Ratio 12 - 28 19 16 15   Sodium 134 - 144 mmol/L 136 140 143  Potassium 3.5 - 5.2 mmol/L 4.7 4.4 4.6  Chloride 96 - 106 mmol/L 98 99 99  CO2 20 - 29 mmol/L 23 25 31(H)  Calcium 8.7 - 10.3 mg/dL 10.4(H) 9.7 10.6(H)    Current antihypertensive regimen:   Lisinopril 20mg  Metoprolol XL 50mg  daily  What recent interventions/DTPs have been made by any provider to improve Blood Pressure control since last CPP Visit: None.  Any recent hospitalizations or ED visits since last visit with CPP? Yes, documented above.   Adherence Review: Is the patient currently on ACE/ARB medication? Lisinopril 20 mg   Does the patient have >5 day gap between last estimated fill dates? Per misc rpts, no.   Care Gaps:Patient needs her mammogram done and updated labs.  Star Rating Drugs:Trulicity 1/66/06 28 DS, Rosuvastatin 10 mg 01/27/21 90 DS, Lisinopril 20 mg 12/15/20 90 DS  I received a call from a lady(Daughter) stated that Mrs. Shivley is currently in the hospital at this time. I informed her to let Mrs.Boldin know that I called to speak to her and call me if she needed any thing.  Follow-Up:Pharmacist Review  Charlann Lange, RMA Clinical Pharmacist Assistant [phone]  5 minutes spent in review, coordination, and documentation.  Reviewed by: Alena Bills, PharmD Clinical Pharmacist [phone]

## 2021-03-09 DIAGNOSIS — R5082 Postprocedural fever: Secondary | ICD-10-CM | POA: Diagnosis not present

## 2021-03-10 DIAGNOSIS — M6250 Muscle wasting and atrophy, not elsewhere classified, unspecified site: Secondary | ICD-10-CM | POA: Diagnosis not present

## 2021-03-10 DIAGNOSIS — E039 Hypothyroidism, unspecified: Secondary | ICD-10-CM | POA: Diagnosis not present

## 2021-03-10 DIAGNOSIS — E119 Type 2 diabetes mellitus without complications: Secondary | ICD-10-CM | POA: Diagnosis not present

## 2021-03-10 DIAGNOSIS — E43 Unspecified severe protein-calorie malnutrition: Secondary | ICD-10-CM | POA: Diagnosis not present

## 2021-03-10 DIAGNOSIS — G7281 Critical illness myopathy: Secondary | ICD-10-CM | POA: Diagnosis not present

## 2021-03-10 DIAGNOSIS — Z7401 Bed confinement status: Secondary | ICD-10-CM | POA: Diagnosis not present

## 2021-03-10 DIAGNOSIS — Z7189 Other specified counseling: Secondary | ICD-10-CM | POA: Diagnosis not present

## 2021-03-10 DIAGNOSIS — I959 Hypotension, unspecified: Secondary | ICD-10-CM | POA: Diagnosis not present

## 2021-03-10 DIAGNOSIS — L98493 Non-pressure chronic ulcer of skin of other sites with necrosis of muscle: Secondary | ICD-10-CM | POA: Diagnosis not present

## 2021-03-10 DIAGNOSIS — R531 Weakness: Secondary | ICD-10-CM | POA: Diagnosis not present

## 2021-03-10 DIAGNOSIS — I5032 Chronic diastolic (congestive) heart failure: Secondary | ICD-10-CM | POA: Diagnosis not present

## 2021-03-10 DIAGNOSIS — Z483 Aftercare following surgery for neoplasm: Secondary | ICD-10-CM | POA: Diagnosis not present

## 2021-03-10 DIAGNOSIS — I10 Essential (primary) hypertension: Secondary | ICD-10-CM | POA: Diagnosis not present

## 2021-03-10 DIAGNOSIS — L98419 Non-pressure chronic ulcer of buttock with unspecified severity: Secondary | ICD-10-CM | POA: Diagnosis not present

## 2021-03-10 DIAGNOSIS — L98499 Non-pressure chronic ulcer of skin of other sites with unspecified severity: Secondary | ICD-10-CM | POA: Diagnosis not present

## 2021-03-10 DIAGNOSIS — R279 Unspecified lack of coordination: Secondary | ICD-10-CM | POA: Diagnosis not present

## 2021-03-10 DIAGNOSIS — S31109A Unspecified open wound of abdominal wall, unspecified quadrant without penetration into peritoneal cavity, initial encounter: Secondary | ICD-10-CM | POA: Diagnosis not present

## 2021-03-10 DIAGNOSIS — A499 Bacterial infection, unspecified: Secondary | ICD-10-CM | POA: Diagnosis not present

## 2021-03-10 DIAGNOSIS — N390 Urinary tract infection, site not specified: Secondary | ICD-10-CM | POA: Diagnosis not present

## 2021-03-10 DIAGNOSIS — R5381 Other malaise: Secondary | ICD-10-CM | POA: Diagnosis not present

## 2021-03-10 DIAGNOSIS — Z433 Encounter for attention to colostomy: Secondary | ICD-10-CM | POA: Diagnosis not present

## 2021-03-10 DIAGNOSIS — K912 Postsurgical malabsorption, not elsewhere classified: Secondary | ICD-10-CM | POA: Diagnosis not present

## 2021-03-10 DIAGNOSIS — R079 Chest pain, unspecified: Secondary | ICD-10-CM | POA: Diagnosis not present

## 2021-03-10 DIAGNOSIS — M6281 Muscle weakness (generalized): Secondary | ICD-10-CM | POA: Diagnosis not present

## 2021-03-10 DIAGNOSIS — I130 Hypertensive heart and chronic kidney disease with heart failure and stage 1 through stage 4 chronic kidney disease, or unspecified chronic kidney disease: Secondary | ICD-10-CM | POA: Diagnosis not present

## 2021-03-10 DIAGNOSIS — J449 Chronic obstructive pulmonary disease, unspecified: Secondary | ICD-10-CM | POA: Diagnosis not present

## 2021-03-10 DIAGNOSIS — Z6841 Body Mass Index (BMI) 40.0 and over, adult: Secondary | ICD-10-CM | POA: Diagnosis not present

## 2021-03-10 DIAGNOSIS — Z452 Encounter for adjustment and management of vascular access device: Secondary | ICD-10-CM | POA: Diagnosis not present

## 2021-03-10 DIAGNOSIS — E871 Hypo-osmolality and hyponatremia: Secondary | ICD-10-CM | POA: Diagnosis not present

## 2021-03-10 DIAGNOSIS — R339 Retention of urine, unspecified: Secondary | ICD-10-CM | POA: Diagnosis not present

## 2021-03-10 DIAGNOSIS — Z950 Presence of cardiac pacemaker: Secondary | ICD-10-CM | POA: Diagnosis not present

## 2021-03-10 DIAGNOSIS — I5022 Chronic systolic (congestive) heart failure: Secondary | ICD-10-CM | POA: Diagnosis not present

## 2021-03-11 DIAGNOSIS — S31109A Unspecified open wound of abdominal wall, unspecified quadrant without penetration into peritoneal cavity, initial encounter: Secondary | ICD-10-CM | POA: Diagnosis not present

## 2021-03-11 DIAGNOSIS — E43 Unspecified severe protein-calorie malnutrition: Secondary | ICD-10-CM | POA: Diagnosis not present

## 2021-03-11 DIAGNOSIS — J449 Chronic obstructive pulmonary disease, unspecified: Secondary | ICD-10-CM | POA: Diagnosis not present

## 2021-03-11 DIAGNOSIS — I5032 Chronic diastolic (congestive) heart failure: Secondary | ICD-10-CM | POA: Diagnosis not present

## 2021-03-11 DIAGNOSIS — G7281 Critical illness myopathy: Secondary | ICD-10-CM | POA: Diagnosis not present

## 2021-03-11 DIAGNOSIS — Z950 Presence of cardiac pacemaker: Secondary | ICD-10-CM | POA: Diagnosis not present

## 2021-03-12 DIAGNOSIS — I5032 Chronic diastolic (congestive) heart failure: Secondary | ICD-10-CM | POA: Diagnosis not present

## 2021-03-12 DIAGNOSIS — G7281 Critical illness myopathy: Secondary | ICD-10-CM | POA: Diagnosis not present

## 2021-03-12 DIAGNOSIS — S31109A Unspecified open wound of abdominal wall, unspecified quadrant without penetration into peritoneal cavity, initial encounter: Secondary | ICD-10-CM | POA: Diagnosis not present

## 2021-03-12 DIAGNOSIS — E43 Unspecified severe protein-calorie malnutrition: Secondary | ICD-10-CM | POA: Diagnosis not present

## 2021-03-13 DIAGNOSIS — S31109A Unspecified open wound of abdominal wall, unspecified quadrant without penetration into peritoneal cavity, initial encounter: Secondary | ICD-10-CM | POA: Diagnosis not present

## 2021-03-13 DIAGNOSIS — G7281 Critical illness myopathy: Secondary | ICD-10-CM | POA: Diagnosis not present

## 2021-03-13 DIAGNOSIS — I5032 Chronic diastolic (congestive) heart failure: Secondary | ICD-10-CM | POA: Diagnosis not present

## 2021-03-13 DIAGNOSIS — E43 Unspecified severe protein-calorie malnutrition: Secondary | ICD-10-CM | POA: Diagnosis not present

## 2021-03-14 DIAGNOSIS — E43 Unspecified severe protein-calorie malnutrition: Secondary | ICD-10-CM | POA: Diagnosis not present

## 2021-03-14 DIAGNOSIS — G7281 Critical illness myopathy: Secondary | ICD-10-CM | POA: Diagnosis not present

## 2021-03-14 DIAGNOSIS — S31109A Unspecified open wound of abdominal wall, unspecified quadrant without penetration into peritoneal cavity, initial encounter: Secondary | ICD-10-CM | POA: Diagnosis not present

## 2021-03-14 DIAGNOSIS — J449 Chronic obstructive pulmonary disease, unspecified: Secondary | ICD-10-CM | POA: Diagnosis not present

## 2021-03-15 DIAGNOSIS — J449 Chronic obstructive pulmonary disease, unspecified: Secondary | ICD-10-CM | POA: Diagnosis not present

## 2021-03-15 DIAGNOSIS — S31109A Unspecified open wound of abdominal wall, unspecified quadrant without penetration into peritoneal cavity, initial encounter: Secondary | ICD-10-CM | POA: Diagnosis not present

## 2021-03-15 DIAGNOSIS — G7281 Critical illness myopathy: Secondary | ICD-10-CM | POA: Diagnosis not present

## 2021-03-15 DIAGNOSIS — E43 Unspecified severe protein-calorie malnutrition: Secondary | ICD-10-CM | POA: Diagnosis not present

## 2021-03-16 DIAGNOSIS — E43 Unspecified severe protein-calorie malnutrition: Secondary | ICD-10-CM | POA: Diagnosis not present

## 2021-03-16 DIAGNOSIS — S31109A Unspecified open wound of abdominal wall, unspecified quadrant without penetration into peritoneal cavity, initial encounter: Secondary | ICD-10-CM | POA: Diagnosis not present

## 2021-03-16 DIAGNOSIS — I5032 Chronic diastolic (congestive) heart failure: Secondary | ICD-10-CM | POA: Diagnosis not present

## 2021-03-16 DIAGNOSIS — J449 Chronic obstructive pulmonary disease, unspecified: Secondary | ICD-10-CM | POA: Diagnosis not present

## 2021-03-17 DIAGNOSIS — E43 Unspecified severe protein-calorie malnutrition: Secondary | ICD-10-CM | POA: Diagnosis not present

## 2021-03-17 DIAGNOSIS — I5032 Chronic diastolic (congestive) heart failure: Secondary | ICD-10-CM | POA: Diagnosis not present

## 2021-03-17 DIAGNOSIS — L98499 Non-pressure chronic ulcer of skin of other sites with unspecified severity: Secondary | ICD-10-CM | POA: Diagnosis not present

## 2021-03-17 DIAGNOSIS — J449 Chronic obstructive pulmonary disease, unspecified: Secondary | ICD-10-CM | POA: Diagnosis not present

## 2021-03-17 DIAGNOSIS — L98493 Non-pressure chronic ulcer of skin of other sites with necrosis of muscle: Secondary | ICD-10-CM | POA: Diagnosis not present

## 2021-03-17 DIAGNOSIS — S31109A Unspecified open wound of abdominal wall, unspecified quadrant without penetration into peritoneal cavity, initial encounter: Secondary | ICD-10-CM | POA: Diagnosis not present

## 2021-03-18 DIAGNOSIS — J449 Chronic obstructive pulmonary disease, unspecified: Secondary | ICD-10-CM | POA: Diagnosis not present

## 2021-03-18 DIAGNOSIS — S31109A Unspecified open wound of abdominal wall, unspecified quadrant without penetration into peritoneal cavity, initial encounter: Secondary | ICD-10-CM | POA: Diagnosis not present

## 2021-03-18 DIAGNOSIS — E43 Unspecified severe protein-calorie malnutrition: Secondary | ICD-10-CM | POA: Diagnosis not present

## 2021-03-18 DIAGNOSIS — I5032 Chronic diastolic (congestive) heart failure: Secondary | ICD-10-CM | POA: Diagnosis not present

## 2021-03-19 DIAGNOSIS — J449 Chronic obstructive pulmonary disease, unspecified: Secondary | ICD-10-CM | POA: Diagnosis not present

## 2021-03-19 DIAGNOSIS — I5032 Chronic diastolic (congestive) heart failure: Secondary | ICD-10-CM | POA: Diagnosis not present

## 2021-03-19 DIAGNOSIS — S31109A Unspecified open wound of abdominal wall, unspecified quadrant without penetration into peritoneal cavity, initial encounter: Secondary | ICD-10-CM | POA: Diagnosis not present

## 2021-03-19 DIAGNOSIS — E43 Unspecified severe protein-calorie malnutrition: Secondary | ICD-10-CM | POA: Diagnosis not present

## 2021-03-20 DIAGNOSIS — I959 Hypotension, unspecified: Secondary | ICD-10-CM | POA: Diagnosis not present

## 2021-03-20 DIAGNOSIS — J449 Chronic obstructive pulmonary disease, unspecified: Secondary | ICD-10-CM | POA: Diagnosis not present

## 2021-03-20 DIAGNOSIS — I5032 Chronic diastolic (congestive) heart failure: Secondary | ICD-10-CM | POA: Diagnosis not present

## 2021-03-20 DIAGNOSIS — E43 Unspecified severe protein-calorie malnutrition: Secondary | ICD-10-CM | POA: Diagnosis not present

## 2021-03-21 DIAGNOSIS — J449 Chronic obstructive pulmonary disease, unspecified: Secondary | ICD-10-CM | POA: Diagnosis not present

## 2021-03-21 DIAGNOSIS — E43 Unspecified severe protein-calorie malnutrition: Secondary | ICD-10-CM | POA: Diagnosis not present

## 2021-03-21 DIAGNOSIS — I5032 Chronic diastolic (congestive) heart failure: Secondary | ICD-10-CM | POA: Diagnosis not present

## 2021-03-21 DIAGNOSIS — I959 Hypotension, unspecified: Secondary | ICD-10-CM | POA: Diagnosis not present

## 2021-03-22 DIAGNOSIS — E43 Unspecified severe protein-calorie malnutrition: Secondary | ICD-10-CM | POA: Diagnosis not present

## 2021-03-22 DIAGNOSIS — I959 Hypotension, unspecified: Secondary | ICD-10-CM | POA: Diagnosis not present

## 2021-03-22 DIAGNOSIS — J449 Chronic obstructive pulmonary disease, unspecified: Secondary | ICD-10-CM | POA: Diagnosis not present

## 2021-03-22 DIAGNOSIS — I5032 Chronic diastolic (congestive) heart failure: Secondary | ICD-10-CM | POA: Diagnosis not present

## 2021-03-23 DIAGNOSIS — E43 Unspecified severe protein-calorie malnutrition: Secondary | ICD-10-CM | POA: Diagnosis not present

## 2021-03-23 DIAGNOSIS — I5032 Chronic diastolic (congestive) heart failure: Secondary | ICD-10-CM | POA: Diagnosis not present

## 2021-03-23 DIAGNOSIS — J449 Chronic obstructive pulmonary disease, unspecified: Secondary | ICD-10-CM | POA: Diagnosis not present

## 2021-03-23 DIAGNOSIS — S31109A Unspecified open wound of abdominal wall, unspecified quadrant without penetration into peritoneal cavity, initial encounter: Secondary | ICD-10-CM | POA: Diagnosis not present

## 2021-03-24 DIAGNOSIS — L98493 Non-pressure chronic ulcer of skin of other sites with necrosis of muscle: Secondary | ICD-10-CM | POA: Diagnosis not present

## 2021-03-24 DIAGNOSIS — L98499 Non-pressure chronic ulcer of skin of other sites with unspecified severity: Secondary | ICD-10-CM | POA: Diagnosis not present

## 2021-03-24 DIAGNOSIS — E43 Unspecified severe protein-calorie malnutrition: Secondary | ICD-10-CM | POA: Diagnosis not present

## 2021-03-24 DIAGNOSIS — S31109A Unspecified open wound of abdominal wall, unspecified quadrant without penetration into peritoneal cavity, initial encounter: Secondary | ICD-10-CM | POA: Diagnosis not present

## 2021-03-24 DIAGNOSIS — J449 Chronic obstructive pulmonary disease, unspecified: Secondary | ICD-10-CM | POA: Diagnosis not present

## 2021-03-24 DIAGNOSIS — Z452 Encounter for adjustment and management of vascular access device: Secondary | ICD-10-CM | POA: Diagnosis not present

## 2021-03-24 DIAGNOSIS — I5032 Chronic diastolic (congestive) heart failure: Secondary | ICD-10-CM | POA: Diagnosis not present

## 2021-03-25 DIAGNOSIS — E43 Unspecified severe protein-calorie malnutrition: Secondary | ICD-10-CM | POA: Diagnosis not present

## 2021-03-25 DIAGNOSIS — J449 Chronic obstructive pulmonary disease, unspecified: Secondary | ICD-10-CM | POA: Diagnosis not present

## 2021-03-25 DIAGNOSIS — I5032 Chronic diastolic (congestive) heart failure: Secondary | ICD-10-CM | POA: Diagnosis not present

## 2021-03-25 DIAGNOSIS — S31109A Unspecified open wound of abdominal wall, unspecified quadrant without penetration into peritoneal cavity, initial encounter: Secondary | ICD-10-CM | POA: Diagnosis not present

## 2021-03-26 DIAGNOSIS — S31109A Unspecified open wound of abdominal wall, unspecified quadrant without penetration into peritoneal cavity, initial encounter: Secondary | ICD-10-CM | POA: Diagnosis not present

## 2021-03-26 DIAGNOSIS — E43 Unspecified severe protein-calorie malnutrition: Secondary | ICD-10-CM | POA: Diagnosis not present

## 2021-03-26 DIAGNOSIS — I5032 Chronic diastolic (congestive) heart failure: Secondary | ICD-10-CM | POA: Diagnosis not present

## 2021-03-26 DIAGNOSIS — J449 Chronic obstructive pulmonary disease, unspecified: Secondary | ICD-10-CM | POA: Diagnosis not present

## 2021-03-27 DIAGNOSIS — J449 Chronic obstructive pulmonary disease, unspecified: Secondary | ICD-10-CM | POA: Diagnosis not present

## 2021-03-27 DIAGNOSIS — E43 Unspecified severe protein-calorie malnutrition: Secondary | ICD-10-CM | POA: Diagnosis not present

## 2021-03-27 DIAGNOSIS — S31109A Unspecified open wound of abdominal wall, unspecified quadrant without penetration into peritoneal cavity, initial encounter: Secondary | ICD-10-CM | POA: Diagnosis not present

## 2021-03-27 DIAGNOSIS — I5032 Chronic diastolic (congestive) heart failure: Secondary | ICD-10-CM | POA: Diagnosis not present

## 2021-03-28 ENCOUNTER — Telehealth: Payer: Self-pay | Admitting: Student-PharmD

## 2021-03-28 DIAGNOSIS — I5032 Chronic diastolic (congestive) heart failure: Secondary | ICD-10-CM | POA: Diagnosis not present

## 2021-03-28 DIAGNOSIS — E43 Unspecified severe protein-calorie malnutrition: Secondary | ICD-10-CM | POA: Diagnosis not present

## 2021-03-28 DIAGNOSIS — S31109A Unspecified open wound of abdominal wall, unspecified quadrant without penetration into peritoneal cavity, initial encounter: Secondary | ICD-10-CM | POA: Diagnosis not present

## 2021-03-28 DIAGNOSIS — J449 Chronic obstructive pulmonary disease, unspecified: Secondary | ICD-10-CM | POA: Diagnosis not present

## 2021-03-28 NOTE — Progress Notes (Addendum)
Chronic Care Management Pharmacy Assistant   Name: Audery Wassenaar  MRN: [id number] DOB: [birth year]  Reason for Encounter: Adherence Review   Conditions to be addressed/monitored: HTN, Chronic Systolic HF, Hypothyroidism, Obesity, Depression.  Recent office visits:  None since 03/08/21  Recent consult visits:  None since 03/08/21  Hospital visits:  None since 03/08/21  Medications: Outpatient Encounter Medications as of 03/28/2021  Medication Sig Note   albuterol (VENTOLIN HFA) 108 (90 Base) MCG/ACT inhaler Inhale 2 puffs into the lungs every 6 (six) hours as needed for wheezing or shortness of breath.    alendronate (FOSAMAX) 70 MG tablet Take 1 tablet (70 mg total) by mouth every 7 (seven) days. Take with a full glass of water on an empty stomach.    cholecalciferol (VITAMIN D) 1000 units tablet Take 1,000 Units by mouth daily.    Cyanocobalamin (VITAMIN B-12 PO) Take by mouth. Pt takes [place] gummies    diclofenac Sodium (VOLTAREN) 1 % GEL Apply topically 4 (four) times daily.    diphenoxylate-atropine (LOMOTIL) 2.5-0.025 MG tablet Take 1 tablet by mouth 4 (four) times daily as needed for diarrhea or loose stools.    docusate sodium (COLACE) 100 MG capsule Take 200 mg by mouth daily.  06/07/2015: Received from: [place] (TRULICITY) 1.5 VV/6.1QA SOPN Inject 1.5 mg into the skin once a week.    DULoxetine (CYMBALTA) 30 MG capsule Take 1 capsule po bid    esomeprazole (NEXIUM) 20 MG capsule Take 20 mg by mouth daily at 12 noon.    fluticasone-salmeterol (ADVAIR) 250-50 MCG/ACT AEPB Inhale 1 puff into the lungs in the morning and at bedtime.    furosemide (LASIX) 20 MG tablet Take 1 tablet (20 mg total) by mouth daily.    gabapentin (NEURONTIN) 300 MG capsule Take 300 mg by mouth 3 (three) times daily.    ipratropium-albuterol (DUONEB) 0.5-2.5 (3) MG/3ML SOLN Take 3 mLs by nebulization every 4 (four) hours as needed.    lisinopril (ZESTRIL) 20 MG tablet  Take 1 tablet (20 mg total) by mouth daily.    Magnesium 250 MG TABS Take by mouth. 06/22/2015: Reports taking once a day   metoprolol succinate (TOPROL-XL) 50 MG 24 hr tablet Take 50 mg by mouth daily. Take with or immediately following a meal.    oxyCODONE (OXY IR/ROXICODONE) 5 MG immediate release tablet Take by mouth.    rosuvastatin (CRESTOR) 10 MG tablet Take 10 mg by mouth 2 (two) times a week.    spironolactone (ALDACTONE) 25 MG tablet 25 mg daily.    No facility-administered encounter medications on file as of 03/28/2021.   GEN CALL: Patient reached out to me in regards of her updated health. She stated her hospital doctors has STOPPED her Trulicity for now. She stated she currently is unable to walk and needs assistant to stand. She stated she does physical therapy 4 times a week. She stated she's been dong  physical therapy for about a month and she doesn't feel like it its working. I explained to her to just give it time. She stated she doesn't have appetite she stated everything taste bland. She stated she drinks ensure drinks to help manage her weight. She stated she has colostomy bag that is causing some concerns but she stated she has follow-up appointment coming up soon. She stated she is still keeping up with her blood sugar and blood pressure levels. She stated her blood sugar is running around 100-114 and her blood  pressure is up and down. She seemed to be in a low mood and I advised things to help her cheer herself up and she voiced understanding. We discussed her following up with the pharmacist and her PCP and she did decided to not make any appointments since she had one coming up with another doctor at this time.   Care Gaps:Patient is due for her mammogram and updated labs.  Star Rating Drugs:Trulicity 06/26/31 28 DS, Rosuvastatin 10 mg 01/27/21 90 DS, Lisinopril 20 mg 12/15/20 90 DS  Follow-Up:Pharmacist Review  Charlann Lange, RMA Clinical Pharmacist  Assistant [phone]  5 minutes spent in review, coordination, and documentation.  Reviewed by: Alena Bills, PharmD Clinical Pharmacist [phone]

## 2021-03-29 DIAGNOSIS — J449 Chronic obstructive pulmonary disease, unspecified: Secondary | ICD-10-CM | POA: Diagnosis not present

## 2021-03-29 DIAGNOSIS — E039 Hypothyroidism, unspecified: Secondary | ICD-10-CM | POA: Diagnosis not present

## 2021-03-29 DIAGNOSIS — E43 Unspecified severe protein-calorie malnutrition: Secondary | ICD-10-CM | POA: Diagnosis not present

## 2021-03-29 DIAGNOSIS — I5032 Chronic diastolic (congestive) heart failure: Secondary | ICD-10-CM | POA: Diagnosis not present

## 2021-03-29 DIAGNOSIS — I5022 Chronic systolic (congestive) heart failure: Secondary | ICD-10-CM | POA: Diagnosis not present

## 2021-03-29 DIAGNOSIS — I10 Essential (primary) hypertension: Secondary | ICD-10-CM | POA: Diagnosis not present

## 2021-03-29 DIAGNOSIS — S31109A Unspecified open wound of abdominal wall, unspecified quadrant without penetration into peritoneal cavity, initial encounter: Secondary | ICD-10-CM | POA: Diagnosis not present

## 2021-03-30 DIAGNOSIS — J449 Chronic obstructive pulmonary disease, unspecified: Secondary | ICD-10-CM | POA: Diagnosis not present

## 2021-03-30 DIAGNOSIS — E119 Type 2 diabetes mellitus without complications: Secondary | ICD-10-CM | POA: Diagnosis not present

## 2021-03-30 DIAGNOSIS — I5032 Chronic diastolic (congestive) heart failure: Secondary | ICD-10-CM | POA: Diagnosis not present

## 2021-03-30 DIAGNOSIS — E43 Unspecified severe protein-calorie malnutrition: Secondary | ICD-10-CM | POA: Diagnosis not present

## 2021-03-31 DIAGNOSIS — L98493 Non-pressure chronic ulcer of skin of other sites with necrosis of muscle: Secondary | ICD-10-CM | POA: Diagnosis not present

## 2021-03-31 DIAGNOSIS — E119 Type 2 diabetes mellitus without complications: Secondary | ICD-10-CM | POA: Diagnosis not present

## 2021-03-31 DIAGNOSIS — J449 Chronic obstructive pulmonary disease, unspecified: Secondary | ICD-10-CM | POA: Diagnosis not present

## 2021-03-31 DIAGNOSIS — E43 Unspecified severe protein-calorie malnutrition: Secondary | ICD-10-CM | POA: Diagnosis not present

## 2021-03-31 DIAGNOSIS — I5032 Chronic diastolic (congestive) heart failure: Secondary | ICD-10-CM | POA: Diagnosis not present

## 2021-03-31 DIAGNOSIS — L98499 Non-pressure chronic ulcer of skin of other sites with unspecified severity: Secondary | ICD-10-CM | POA: Diagnosis not present

## 2021-04-01 DIAGNOSIS — I5032 Chronic diastolic (congestive) heart failure: Secondary | ICD-10-CM | POA: Diagnosis not present

## 2021-04-01 DIAGNOSIS — E119 Type 2 diabetes mellitus without complications: Secondary | ICD-10-CM | POA: Diagnosis not present

## 2021-04-01 DIAGNOSIS — J449 Chronic obstructive pulmonary disease, unspecified: Secondary | ICD-10-CM | POA: Diagnosis not present

## 2021-04-01 DIAGNOSIS — E43 Unspecified severe protein-calorie malnutrition: Secondary | ICD-10-CM | POA: Diagnosis not present

## 2021-04-02 DIAGNOSIS — E43 Unspecified severe protein-calorie malnutrition: Secondary | ICD-10-CM | POA: Diagnosis not present

## 2021-04-02 DIAGNOSIS — E119 Type 2 diabetes mellitus without complications: Secondary | ICD-10-CM | POA: Diagnosis not present

## 2021-04-02 DIAGNOSIS — I5032 Chronic diastolic (congestive) heart failure: Secondary | ICD-10-CM | POA: Diagnosis not present

## 2021-04-02 DIAGNOSIS — E871 Hypo-osmolality and hyponatremia: Secondary | ICD-10-CM | POA: Diagnosis not present

## 2021-04-03 DIAGNOSIS — E43 Unspecified severe protein-calorie malnutrition: Secondary | ICD-10-CM | POA: Diagnosis not present

## 2021-04-03 DIAGNOSIS — E871 Hypo-osmolality and hyponatremia: Secondary | ICD-10-CM | POA: Diagnosis not present

## 2021-04-03 DIAGNOSIS — R079 Chest pain, unspecified: Secondary | ICD-10-CM | POA: Diagnosis not present

## 2021-04-03 DIAGNOSIS — E119 Type 2 diabetes mellitus without complications: Secondary | ICD-10-CM | POA: Diagnosis not present

## 2021-04-04 DIAGNOSIS — R339 Retention of urine, unspecified: Secondary | ICD-10-CM | POA: Diagnosis not present

## 2021-04-04 DIAGNOSIS — R079 Chest pain, unspecified: Secondary | ICD-10-CM | POA: Diagnosis not present

## 2021-04-04 DIAGNOSIS — E43 Unspecified severe protein-calorie malnutrition: Secondary | ICD-10-CM | POA: Diagnosis not present

## 2021-04-04 DIAGNOSIS — E871 Hypo-osmolality and hyponatremia: Secondary | ICD-10-CM | POA: Diagnosis not present

## 2021-04-05 DIAGNOSIS — R079 Chest pain, unspecified: Secondary | ICD-10-CM | POA: Diagnosis not present

## 2021-04-05 DIAGNOSIS — E871 Hypo-osmolality and hyponatremia: Secondary | ICD-10-CM | POA: Diagnosis not present

## 2021-04-05 DIAGNOSIS — R339 Retention of urine, unspecified: Secondary | ICD-10-CM | POA: Diagnosis not present

## 2021-04-05 DIAGNOSIS — E43 Unspecified severe protein-calorie malnutrition: Secondary | ICD-10-CM | POA: Diagnosis not present

## 2021-04-06 DIAGNOSIS — N390 Urinary tract infection, site not specified: Secondary | ICD-10-CM | POA: Diagnosis not present

## 2021-04-06 DIAGNOSIS — R339 Retention of urine, unspecified: Secondary | ICD-10-CM | POA: Diagnosis not present

## 2021-04-06 DIAGNOSIS — R079 Chest pain, unspecified: Secondary | ICD-10-CM | POA: Diagnosis not present

## 2021-04-06 DIAGNOSIS — E43 Unspecified severe protein-calorie malnutrition: Secondary | ICD-10-CM | POA: Diagnosis not present

## 2021-04-07 DIAGNOSIS — L98499 Non-pressure chronic ulcer of skin of other sites with unspecified severity: Secondary | ICD-10-CM | POA: Diagnosis not present

## 2021-04-07 DIAGNOSIS — R339 Retention of urine, unspecified: Secondary | ICD-10-CM | POA: Diagnosis not present

## 2021-04-07 DIAGNOSIS — N390 Urinary tract infection, site not specified: Secondary | ICD-10-CM | POA: Diagnosis not present

## 2021-04-07 DIAGNOSIS — E43 Unspecified severe protein-calorie malnutrition: Secondary | ICD-10-CM | POA: Diagnosis not present

## 2021-04-07 DIAGNOSIS — J449 Chronic obstructive pulmonary disease, unspecified: Secondary | ICD-10-CM | POA: Diagnosis not present

## 2021-04-08 DIAGNOSIS — E43 Unspecified severe protein-calorie malnutrition: Secondary | ICD-10-CM | POA: Diagnosis not present

## 2021-04-08 DIAGNOSIS — R339 Retention of urine, unspecified: Secondary | ICD-10-CM | POA: Diagnosis not present

## 2021-04-08 DIAGNOSIS — N390 Urinary tract infection, site not specified: Secondary | ICD-10-CM | POA: Diagnosis not present

## 2021-04-08 DIAGNOSIS — J449 Chronic obstructive pulmonary disease, unspecified: Secondary | ICD-10-CM | POA: Diagnosis not present

## 2021-04-09 DIAGNOSIS — J449 Chronic obstructive pulmonary disease, unspecified: Secondary | ICD-10-CM | POA: Diagnosis not present

## 2021-04-09 DIAGNOSIS — R339 Retention of urine, unspecified: Secondary | ICD-10-CM | POA: Diagnosis not present

## 2021-04-09 DIAGNOSIS — N390 Urinary tract infection, site not specified: Secondary | ICD-10-CM | POA: Diagnosis not present

## 2021-04-09 DIAGNOSIS — E43 Unspecified severe protein-calorie malnutrition: Secondary | ICD-10-CM | POA: Diagnosis not present

## 2021-04-10 DIAGNOSIS — J449 Chronic obstructive pulmonary disease, unspecified: Secondary | ICD-10-CM | POA: Diagnosis not present

## 2021-04-10 DIAGNOSIS — E43 Unspecified severe protein-calorie malnutrition: Secondary | ICD-10-CM | POA: Diagnosis not present

## 2021-04-10 DIAGNOSIS — N390 Urinary tract infection, site not specified: Secondary | ICD-10-CM | POA: Diagnosis not present

## 2021-04-10 DIAGNOSIS — R339 Retention of urine, unspecified: Secondary | ICD-10-CM | POA: Diagnosis not present

## 2021-04-11 ENCOUNTER — Ambulatory Visit: Payer: Self-pay | Admitting: Student-PharmD

## 2021-04-11 DIAGNOSIS — R339 Retention of urine, unspecified: Secondary | ICD-10-CM | POA: Diagnosis not present

## 2021-04-11 DIAGNOSIS — I10 Essential (primary) hypertension: Secondary | ICD-10-CM

## 2021-04-11 DIAGNOSIS — N390 Urinary tract infection, site not specified: Secondary | ICD-10-CM | POA: Diagnosis not present

## 2021-04-11 DIAGNOSIS — J449 Chronic obstructive pulmonary disease, unspecified: Secondary | ICD-10-CM | POA: Diagnosis not present

## 2021-04-11 DIAGNOSIS — E43 Unspecified severe protein-calorie malnutrition: Secondary | ICD-10-CM | POA: Diagnosis not present

## 2021-04-11 NOTE — Progress Notes (Signed)
Hypertension (HTN) Review Call   Diana Bernard,Diana Bernard  [id number] 58 years, Female  DOB: 10-Apr-1946  M: [phone]  Hypertension Review  Completed by Charlann Lange on 04/10/2021  Chart Review Is the patient enrolled in RPM with BP Monitor?: No BP #1 reading (last): 146/69 on: 03/03/2021 BP #2 reading: 124/52 on: 02/10/2021 BP #3 reading: 120/62 on: 01/24/2021 Any of the last 3 BP > 140/90 mmHg?: Yes What recent interventions/DTPs have been made by any provider to improve the patient's conditions in the last 3 months?:  03/04/21 [facility] Sharyne Richters, MD , Stitzenberg, Clint Lipps, MD. For weakness/Postoperative fever.  STARTED meropenem 1 gram/50 mL Pgbk Infuse 50 mL (1 g total) into a venous catheter every eight (8) hours for 12 days and venlafaxine 37.5 MG tablet Take 1 tablet (37.5 mg total) by mouth daily. Any recent hospitalizations or ED visits since last visit with CPP?: No  Adherence Review Adherence rates for STAR metric medications:  Trulicity 1.5 CL/2.7NT 28 DS 01/14/21, 12/29/20 Rosuvastatin 10 mg 90 DS 01/27/21, 11/14/20 Lisinopril 20 mg 90 DS 12/15/20, 10/03/20 Adherence rates for medications indicated for disease state being reviewed: Lisinopril 20 mg 90 DS 12/15/20, 10/03/20 Does the patient have >5 day gap between last estimated fill dates for any of the above medications?: Yes Reasons for medication gaps: Patient was recently in the hospital and more than likely the patient has extra pills at home because of this.   Disease State Questions Able to connect with the Patient?: Yes Is the patient monitoring his/her BP?: Yes How often are you checking your BP?: daily Home BP Reading #1 (most recent): N/A. Is the patient having any low BP Readings <90/60?: No Is the patient having any BP readings above >180/100?: No Is the patient's average BP>140/90?: No Educate patient to inform proper points on checking BP at home:: Make sure using the right size  cuff, the length of the cuff's bladder should be at least equal to 75% of the circumference of the upper arm. What diet changes have you made to improve your Blood Pressure Control?: limiting / monitoring salt intake What exercise are you doing to improve your Blood Pressure Control?: no formal exercise Misc. Response/Information:: Patient stated she is currently in a rehab facility trying to learn how to walk again.  Pharmacist Review  Adherence gaps identified?: No Drug Therapy Problems identified?: No Assessment: Uncontrolled Plan: F/U with patient in Feb. currently in rehab facility following hospitalization.  15 minutes spent in review, coordination, and documentation.  Reviewed by: Alena Bills, PharmD Clinical Pharmacist [phone]

## 2021-04-12 DIAGNOSIS — J449 Chronic obstructive pulmonary disease, unspecified: Secondary | ICD-10-CM | POA: Diagnosis not present

## 2021-04-12 DIAGNOSIS — R339 Retention of urine, unspecified: Secondary | ICD-10-CM | POA: Diagnosis not present

## 2021-04-12 DIAGNOSIS — E43 Unspecified severe protein-calorie malnutrition: Secondary | ICD-10-CM | POA: Diagnosis not present

## 2021-04-12 DIAGNOSIS — N390 Urinary tract infection, site not specified: Secondary | ICD-10-CM | POA: Diagnosis not present

## 2021-04-13 DIAGNOSIS — N390 Urinary tract infection, site not specified: Secondary | ICD-10-CM | POA: Diagnosis not present

## 2021-04-13 DIAGNOSIS — J449 Chronic obstructive pulmonary disease, unspecified: Secondary | ICD-10-CM | POA: Diagnosis not present

## 2021-04-13 DIAGNOSIS — Z433 Encounter for attention to colostomy: Secondary | ICD-10-CM | POA: Diagnosis not present

## 2021-04-13 DIAGNOSIS — E43 Unspecified severe protein-calorie malnutrition: Secondary | ICD-10-CM | POA: Diagnosis not present

## 2021-04-14 DIAGNOSIS — J449 Chronic obstructive pulmonary disease, unspecified: Secondary | ICD-10-CM | POA: Diagnosis not present

## 2021-04-14 DIAGNOSIS — N390 Urinary tract infection, site not specified: Secondary | ICD-10-CM | POA: Diagnosis not present

## 2021-04-14 DIAGNOSIS — L98499 Non-pressure chronic ulcer of skin of other sites with unspecified severity: Secondary | ICD-10-CM | POA: Diagnosis not present

## 2021-04-14 DIAGNOSIS — E43 Unspecified severe protein-calorie malnutrition: Secondary | ICD-10-CM | POA: Diagnosis not present

## 2021-04-14 DIAGNOSIS — L98493 Non-pressure chronic ulcer of skin of other sites with necrosis of muscle: Secondary | ICD-10-CM | POA: Diagnosis not present

## 2021-04-14 DIAGNOSIS — Z433 Encounter for attention to colostomy: Secondary | ICD-10-CM | POA: Diagnosis not present

## 2021-04-15 DIAGNOSIS — J449 Chronic obstructive pulmonary disease, unspecified: Secondary | ICD-10-CM | POA: Diagnosis not present

## 2021-04-15 DIAGNOSIS — Z433 Encounter for attention to colostomy: Secondary | ICD-10-CM | POA: Diagnosis not present

## 2021-04-15 DIAGNOSIS — E43 Unspecified severe protein-calorie malnutrition: Secondary | ICD-10-CM | POA: Diagnosis not present

## 2021-04-15 DIAGNOSIS — N390 Urinary tract infection, site not specified: Secondary | ICD-10-CM | POA: Diagnosis not present

## 2021-04-16 DIAGNOSIS — N390 Urinary tract infection, site not specified: Secondary | ICD-10-CM | POA: Diagnosis not present

## 2021-04-16 DIAGNOSIS — J449 Chronic obstructive pulmonary disease, unspecified: Secondary | ICD-10-CM | POA: Diagnosis not present

## 2021-04-16 DIAGNOSIS — E43 Unspecified severe protein-calorie malnutrition: Secondary | ICD-10-CM | POA: Diagnosis not present

## 2021-04-16 DIAGNOSIS — Z433 Encounter for attention to colostomy: Secondary | ICD-10-CM | POA: Diagnosis not present

## 2021-04-17 ENCOUNTER — Telehealth: Payer: Self-pay

## 2021-04-17 DIAGNOSIS — Z433 Encounter for attention to colostomy: Secondary | ICD-10-CM | POA: Diagnosis not present

## 2021-04-17 DIAGNOSIS — J449 Chronic obstructive pulmonary disease, unspecified: Secondary | ICD-10-CM | POA: Diagnosis not present

## 2021-04-17 DIAGNOSIS — N390 Urinary tract infection, site not specified: Secondary | ICD-10-CM | POA: Diagnosis not present

## 2021-04-17 DIAGNOSIS — E43 Unspecified severe protein-calorie malnutrition: Secondary | ICD-10-CM | POA: Diagnosis not present

## 2021-04-17 NOTE — Telephone Encounter (Signed)
Called patient to schedule f/u appointment. She stated she is in hospital. Will call once discharged-Toni

## 2021-04-18 DIAGNOSIS — J449 Chronic obstructive pulmonary disease, unspecified: Secondary | ICD-10-CM | POA: Diagnosis not present

## 2021-04-18 DIAGNOSIS — E43 Unspecified severe protein-calorie malnutrition: Secondary | ICD-10-CM | POA: Diagnosis not present

## 2021-04-18 DIAGNOSIS — R339 Retention of urine, unspecified: Secondary | ICD-10-CM | POA: Diagnosis not present

## 2021-04-18 DIAGNOSIS — Z433 Encounter for attention to colostomy: Secondary | ICD-10-CM | POA: Diagnosis not present

## 2021-04-19 DIAGNOSIS — J449 Chronic obstructive pulmonary disease, unspecified: Secondary | ICD-10-CM | POA: Diagnosis not present

## 2021-04-19 DIAGNOSIS — E43 Unspecified severe protein-calorie malnutrition: Secondary | ICD-10-CM | POA: Diagnosis not present

## 2021-04-19 DIAGNOSIS — R339 Retention of urine, unspecified: Secondary | ICD-10-CM | POA: Diagnosis not present

## 2021-04-19 DIAGNOSIS — Z433 Encounter for attention to colostomy: Secondary | ICD-10-CM | POA: Diagnosis not present

## 2021-04-20 DIAGNOSIS — J449 Chronic obstructive pulmonary disease, unspecified: Secondary | ICD-10-CM | POA: Diagnosis not present

## 2021-04-20 DIAGNOSIS — R339 Retention of urine, unspecified: Secondary | ICD-10-CM | POA: Diagnosis not present

## 2021-04-20 DIAGNOSIS — Z433 Encounter for attention to colostomy: Secondary | ICD-10-CM | POA: Diagnosis not present

## 2021-04-20 DIAGNOSIS — E43 Unspecified severe protein-calorie malnutrition: Secondary | ICD-10-CM | POA: Diagnosis not present

## 2021-04-21 DIAGNOSIS — E43 Unspecified severe protein-calorie malnutrition: Secondary | ICD-10-CM | POA: Diagnosis not present

## 2021-04-21 DIAGNOSIS — L98419 Non-pressure chronic ulcer of buttock with unspecified severity: Secondary | ICD-10-CM | POA: Diagnosis not present

## 2021-04-21 DIAGNOSIS — R339 Retention of urine, unspecified: Secondary | ICD-10-CM | POA: Diagnosis not present

## 2021-04-21 DIAGNOSIS — J449 Chronic obstructive pulmonary disease, unspecified: Secondary | ICD-10-CM | POA: Diagnosis not present

## 2021-04-21 DIAGNOSIS — L98499 Non-pressure chronic ulcer of skin of other sites with unspecified severity: Secondary | ICD-10-CM | POA: Diagnosis not present

## 2021-04-21 DIAGNOSIS — Z433 Encounter for attention to colostomy: Secondary | ICD-10-CM | POA: Diagnosis not present

## 2021-04-22 DIAGNOSIS — R339 Retention of urine, unspecified: Secondary | ICD-10-CM | POA: Diagnosis not present

## 2021-04-22 DIAGNOSIS — J449 Chronic obstructive pulmonary disease, unspecified: Secondary | ICD-10-CM | POA: Diagnosis not present

## 2021-04-22 DIAGNOSIS — Z433 Encounter for attention to colostomy: Secondary | ICD-10-CM | POA: Diagnosis not present

## 2021-04-22 DIAGNOSIS — E43 Unspecified severe protein-calorie malnutrition: Secondary | ICD-10-CM | POA: Diagnosis not present

## 2021-04-23 DIAGNOSIS — Z433 Encounter for attention to colostomy: Secondary | ICD-10-CM | POA: Diagnosis not present

## 2021-04-23 DIAGNOSIS — R339 Retention of urine, unspecified: Secondary | ICD-10-CM | POA: Diagnosis not present

## 2021-04-23 DIAGNOSIS — J449 Chronic obstructive pulmonary disease, unspecified: Secondary | ICD-10-CM | POA: Diagnosis not present

## 2021-04-23 DIAGNOSIS — E43 Unspecified severe protein-calorie malnutrition: Secondary | ICD-10-CM | POA: Diagnosis not present

## 2021-04-24 DIAGNOSIS — Z433 Encounter for attention to colostomy: Secondary | ICD-10-CM | POA: Diagnosis not present

## 2021-04-24 DIAGNOSIS — J449 Chronic obstructive pulmonary disease, unspecified: Secondary | ICD-10-CM | POA: Diagnosis not present

## 2021-04-24 DIAGNOSIS — R339 Retention of urine, unspecified: Secondary | ICD-10-CM | POA: Diagnosis not present

## 2021-04-24 DIAGNOSIS — E43 Unspecified severe protein-calorie malnutrition: Secondary | ICD-10-CM | POA: Diagnosis not present

## 2021-04-25 DIAGNOSIS — G7281 Critical illness myopathy: Secondary | ICD-10-CM | POA: Diagnosis not present

## 2021-04-25 DIAGNOSIS — Z7189 Other specified counseling: Secondary | ICD-10-CM | POA: Diagnosis not present

## 2021-04-25 DIAGNOSIS — Z433 Encounter for attention to colostomy: Secondary | ICD-10-CM | POA: Diagnosis not present

## 2021-04-25 DIAGNOSIS — E43 Unspecified severe protein-calorie malnutrition: Secondary | ICD-10-CM | POA: Diagnosis not present

## 2021-04-26 ENCOUNTER — Other Ambulatory Visit: Payer: Self-pay | Admitting: Internal Medicine

## 2021-04-26 DIAGNOSIS — Z433 Encounter for attention to colostomy: Secondary | ICD-10-CM | POA: Diagnosis not present

## 2021-04-26 DIAGNOSIS — E43 Unspecified severe protein-calorie malnutrition: Secondary | ICD-10-CM | POA: Diagnosis not present

## 2021-04-26 DIAGNOSIS — G7281 Critical illness myopathy: Secondary | ICD-10-CM | POA: Diagnosis not present

## 2021-04-26 DIAGNOSIS — Z7189 Other specified counseling: Secondary | ICD-10-CM | POA: Diagnosis not present

## 2021-04-27 DIAGNOSIS — Z7189 Other specified counseling: Secondary | ICD-10-CM | POA: Diagnosis not present

## 2021-04-27 DIAGNOSIS — E43 Unspecified severe protein-calorie malnutrition: Secondary | ICD-10-CM | POA: Diagnosis not present

## 2021-04-27 DIAGNOSIS — G7281 Critical illness myopathy: Secondary | ICD-10-CM | POA: Diagnosis not present

## 2021-04-27 DIAGNOSIS — Z433 Encounter for attention to colostomy: Secondary | ICD-10-CM | POA: Diagnosis not present

## 2021-04-28 DIAGNOSIS — E039 Hypothyroidism, unspecified: Secondary | ICD-10-CM | POA: Diagnosis not present

## 2021-04-28 DIAGNOSIS — L98499 Non-pressure chronic ulcer of skin of other sites with unspecified severity: Secondary | ICD-10-CM | POA: Diagnosis not present

## 2021-04-28 DIAGNOSIS — Z433 Encounter for attention to colostomy: Secondary | ICD-10-CM | POA: Diagnosis not present

## 2021-04-28 DIAGNOSIS — I10 Essential (primary) hypertension: Secondary | ICD-10-CM | POA: Diagnosis not present

## 2021-04-28 DIAGNOSIS — G7281 Critical illness myopathy: Secondary | ICD-10-CM | POA: Diagnosis not present

## 2021-04-28 DIAGNOSIS — E43 Unspecified severe protein-calorie malnutrition: Secondary | ICD-10-CM | POA: Diagnosis not present

## 2021-04-28 DIAGNOSIS — L98419 Non-pressure chronic ulcer of buttock with unspecified severity: Secondary | ICD-10-CM | POA: Diagnosis not present

## 2021-04-28 DIAGNOSIS — I5022 Chronic systolic (congestive) heart failure: Secondary | ICD-10-CM | POA: Diagnosis not present

## 2021-04-28 DIAGNOSIS — Z7189 Other specified counseling: Secondary | ICD-10-CM | POA: Diagnosis not present

## 2021-04-29 DIAGNOSIS — G7281 Critical illness myopathy: Secondary | ICD-10-CM | POA: Diagnosis not present

## 2021-04-29 DIAGNOSIS — Z433 Encounter for attention to colostomy: Secondary | ICD-10-CM | POA: Diagnosis not present

## 2021-04-29 DIAGNOSIS — E43 Unspecified severe protein-calorie malnutrition: Secondary | ICD-10-CM | POA: Diagnosis not present

## 2021-04-29 DIAGNOSIS — Z7189 Other specified counseling: Secondary | ICD-10-CM | POA: Diagnosis not present

## 2021-04-30 DIAGNOSIS — Z7189 Other specified counseling: Secondary | ICD-10-CM | POA: Diagnosis not present

## 2021-04-30 DIAGNOSIS — Z433 Encounter for attention to colostomy: Secondary | ICD-10-CM | POA: Diagnosis not present

## 2021-04-30 DIAGNOSIS — G7281 Critical illness myopathy: Secondary | ICD-10-CM | POA: Diagnosis not present

## 2021-04-30 DIAGNOSIS — E43 Unspecified severe protein-calorie malnutrition: Secondary | ICD-10-CM | POA: Diagnosis not present

## 2021-05-01 DIAGNOSIS — Z7189 Other specified counseling: Secondary | ICD-10-CM | POA: Diagnosis not present

## 2021-05-01 DIAGNOSIS — G7281 Critical illness myopathy: Secondary | ICD-10-CM | POA: Diagnosis not present

## 2021-05-01 DIAGNOSIS — E43 Unspecified severe protein-calorie malnutrition: Secondary | ICD-10-CM | POA: Diagnosis not present

## 2021-05-01 DIAGNOSIS — Z433 Encounter for attention to colostomy: Secondary | ICD-10-CM | POA: Diagnosis not present

## 2021-05-02 DIAGNOSIS — Z433 Encounter for attention to colostomy: Secondary | ICD-10-CM | POA: Diagnosis not present

## 2021-05-02 DIAGNOSIS — E43 Unspecified severe protein-calorie malnutrition: Secondary | ICD-10-CM | POA: Diagnosis not present

## 2021-05-02 DIAGNOSIS — Z7189 Other specified counseling: Secondary | ICD-10-CM | POA: Diagnosis not present

## 2021-05-02 DIAGNOSIS — G7281 Critical illness myopathy: Secondary | ICD-10-CM | POA: Diagnosis not present

## 2021-05-03 DIAGNOSIS — E43 Unspecified severe protein-calorie malnutrition: Secondary | ICD-10-CM | POA: Diagnosis not present

## 2021-05-03 DIAGNOSIS — Z7189 Other specified counseling: Secondary | ICD-10-CM | POA: Diagnosis not present

## 2021-05-03 DIAGNOSIS — Z433 Encounter for attention to colostomy: Secondary | ICD-10-CM | POA: Diagnosis not present

## 2021-05-03 DIAGNOSIS — G7281 Critical illness myopathy: Secondary | ICD-10-CM | POA: Diagnosis not present

## 2021-05-04 DIAGNOSIS — Z7189 Other specified counseling: Secondary | ICD-10-CM | POA: Diagnosis not present

## 2021-05-04 DIAGNOSIS — G7281 Critical illness myopathy: Secondary | ICD-10-CM | POA: Diagnosis not present

## 2021-05-04 DIAGNOSIS — Z433 Encounter for attention to colostomy: Secondary | ICD-10-CM | POA: Diagnosis not present

## 2021-05-04 DIAGNOSIS — E43 Unspecified severe protein-calorie malnutrition: Secondary | ICD-10-CM | POA: Diagnosis not present

## 2021-05-05 DIAGNOSIS — E43 Unspecified severe protein-calorie malnutrition: Secondary | ICD-10-CM | POA: Diagnosis not present

## 2021-05-05 DIAGNOSIS — G7281 Critical illness myopathy: Secondary | ICD-10-CM | POA: Diagnosis not present

## 2021-05-05 DIAGNOSIS — Z7189 Other specified counseling: Secondary | ICD-10-CM | POA: Diagnosis not present

## 2021-05-05 DIAGNOSIS — L98419 Non-pressure chronic ulcer of buttock with unspecified severity: Secondary | ICD-10-CM | POA: Diagnosis not present

## 2021-05-05 DIAGNOSIS — Z433 Encounter for attention to colostomy: Secondary | ICD-10-CM | POA: Diagnosis not present

## 2021-05-06 DIAGNOSIS — G7281 Critical illness myopathy: Secondary | ICD-10-CM | POA: Diagnosis not present

## 2021-05-06 DIAGNOSIS — E43 Unspecified severe protein-calorie malnutrition: Secondary | ICD-10-CM | POA: Diagnosis not present

## 2021-05-06 DIAGNOSIS — Z7189 Other specified counseling: Secondary | ICD-10-CM | POA: Diagnosis not present

## 2021-05-06 DIAGNOSIS — S31109A Unspecified open wound of abdominal wall, unspecified quadrant without penetration into peritoneal cavity, initial encounter: Secondary | ICD-10-CM | POA: Diagnosis not present

## 2021-05-07 DIAGNOSIS — Z7189 Other specified counseling: Secondary | ICD-10-CM | POA: Diagnosis not present

## 2021-05-07 DIAGNOSIS — E43 Unspecified severe protein-calorie malnutrition: Secondary | ICD-10-CM | POA: Diagnosis not present

## 2021-05-07 DIAGNOSIS — G7281 Critical illness myopathy: Secondary | ICD-10-CM | POA: Diagnosis not present

## 2021-05-07 DIAGNOSIS — S31109A Unspecified open wound of abdominal wall, unspecified quadrant without penetration into peritoneal cavity, initial encounter: Secondary | ICD-10-CM | POA: Diagnosis not present

## 2021-05-08 DIAGNOSIS — E43 Unspecified severe protein-calorie malnutrition: Secondary | ICD-10-CM | POA: Diagnosis not present

## 2021-05-08 DIAGNOSIS — G7281 Critical illness myopathy: Secondary | ICD-10-CM | POA: Diagnosis not present

## 2021-05-08 DIAGNOSIS — Z7189 Other specified counseling: Secondary | ICD-10-CM | POA: Diagnosis not present

## 2021-05-08 DIAGNOSIS — S31109A Unspecified open wound of abdominal wall, unspecified quadrant without penetration into peritoneal cavity, initial encounter: Secondary | ICD-10-CM | POA: Diagnosis not present

## 2021-05-09 DIAGNOSIS — G7281 Critical illness myopathy: Secondary | ICD-10-CM | POA: Diagnosis not present

## 2021-05-09 DIAGNOSIS — Z7189 Other specified counseling: Secondary | ICD-10-CM | POA: Diagnosis not present

## 2021-05-09 DIAGNOSIS — S31109A Unspecified open wound of abdominal wall, unspecified quadrant without penetration into peritoneal cavity, initial encounter: Secondary | ICD-10-CM | POA: Diagnosis not present

## 2021-05-09 DIAGNOSIS — E43 Unspecified severe protein-calorie malnutrition: Secondary | ICD-10-CM | POA: Diagnosis not present

## 2021-05-10 DIAGNOSIS — S31109A Unspecified open wound of abdominal wall, unspecified quadrant without penetration into peritoneal cavity, initial encounter: Secondary | ICD-10-CM | POA: Diagnosis not present

## 2021-05-10 DIAGNOSIS — G7281 Critical illness myopathy: Secondary | ICD-10-CM | POA: Diagnosis not present

## 2021-05-10 DIAGNOSIS — Z7189 Other specified counseling: Secondary | ICD-10-CM | POA: Diagnosis not present

## 2021-05-10 DIAGNOSIS — E43 Unspecified severe protein-calorie malnutrition: Secondary | ICD-10-CM | POA: Diagnosis not present

## 2021-05-11 DIAGNOSIS — Z7189 Other specified counseling: Secondary | ICD-10-CM | POA: Diagnosis not present

## 2021-05-11 DIAGNOSIS — G7281 Critical illness myopathy: Secondary | ICD-10-CM | POA: Diagnosis not present

## 2021-05-11 DIAGNOSIS — S31109A Unspecified open wound of abdominal wall, unspecified quadrant without penetration into peritoneal cavity, initial encounter: Secondary | ICD-10-CM | POA: Diagnosis not present

## 2021-05-11 DIAGNOSIS — E43 Unspecified severe protein-calorie malnutrition: Secondary | ICD-10-CM | POA: Diagnosis not present

## 2021-05-12 DIAGNOSIS — L98499 Non-pressure chronic ulcer of skin of other sites with unspecified severity: Secondary | ICD-10-CM | POA: Diagnosis not present

## 2021-05-17 DIAGNOSIS — M6250 Muscle wasting and atrophy, not elsewhere classified, unspecified site: Secondary | ICD-10-CM | POA: Diagnosis not present

## 2021-05-17 DIAGNOSIS — Z7401 Bed confinement status: Secondary | ICD-10-CM | POA: Diagnosis not present

## 2021-05-17 DIAGNOSIS — R531 Weakness: Secondary | ICD-10-CM | POA: Diagnosis not present

## 2021-05-22 ENCOUNTER — Telehealth: Payer: Self-pay

## 2021-05-22 ENCOUNTER — Encounter: Payer: Self-pay | Admitting: Physician Assistant

## 2021-05-22 ENCOUNTER — Telehealth (INDEPENDENT_AMBULATORY_CARE_PROVIDER_SITE_OTHER): Payer: Medicare HMO | Admitting: Physician Assistant

## 2021-05-22 ENCOUNTER — Other Ambulatory Visit: Payer: Self-pay

## 2021-05-22 DIAGNOSIS — N3001 Acute cystitis with hematuria: Secondary | ICD-10-CM | POA: Diagnosis not present

## 2021-05-22 DIAGNOSIS — C541 Malignant neoplasm of endometrium: Secondary | ICD-10-CM | POA: Diagnosis not present

## 2021-05-22 DIAGNOSIS — C20 Malignant neoplasm of rectum: Secondary | ICD-10-CM | POA: Diagnosis not present

## 2021-05-22 DIAGNOSIS — Z4689 Encounter for fitting and adjustment of other specified devices: Secondary | ICD-10-CM

## 2021-05-22 DIAGNOSIS — I959 Hypotension, unspecified: Secondary | ICD-10-CM | POA: Diagnosis not present

## 2021-05-22 DIAGNOSIS — Z20822 Contact with and (suspected) exposure to covid-19: Secondary | ICD-10-CM | POA: Diagnosis not present

## 2021-05-22 DIAGNOSIS — J449 Chronic obstructive pulmonary disease, unspecified: Secondary | ICD-10-CM | POA: Diagnosis not present

## 2021-05-22 DIAGNOSIS — Z933 Colostomy status: Secondary | ICD-10-CM

## 2021-05-22 DIAGNOSIS — Z6841 Body Mass Index (BMI) 40.0 and over, adult: Secondary | ICD-10-CM | POA: Diagnosis not present

## 2021-05-22 DIAGNOSIS — I5022 Chronic systolic (congestive) heart failure: Secondary | ICD-10-CM | POA: Diagnosis not present

## 2021-05-22 DIAGNOSIS — R32 Unspecified urinary incontinence: Secondary | ICD-10-CM

## 2021-05-22 DIAGNOSIS — I130 Hypertensive heart and chronic kidney disease with heart failure and stage 1 through stage 4 chronic kidney disease, or unspecified chronic kidney disease: Secondary | ICD-10-CM | POA: Diagnosis not present

## 2021-05-22 DIAGNOSIS — L539 Erythematous condition, unspecified: Secondary | ICD-10-CM | POA: Diagnosis not present

## 2021-05-22 DIAGNOSIS — Z09 Encounter for follow-up examination after completed treatment for conditions other than malignant neoplasm: Secondary | ICD-10-CM | POA: Diagnosis not present

## 2021-05-22 DIAGNOSIS — R0902 Hypoxemia: Secondary | ICD-10-CM | POA: Diagnosis not present

## 2021-05-22 DIAGNOSIS — N390 Urinary tract infection, site not specified: Secondary | ICD-10-CM | POA: Diagnosis not present

## 2021-05-22 DIAGNOSIS — M533 Sacrococcygeal disorders, not elsewhere classified: Secondary | ICD-10-CM | POA: Diagnosis not present

## 2021-05-22 DIAGNOSIS — S31000A Unspecified open wound of lower back and pelvis without penetration into retroperitoneum, initial encounter: Secondary | ICD-10-CM | POA: Diagnosis not present

## 2021-05-22 NOTE — Telephone Encounter (Signed)
Pt had a virtual appt today with Nancee Liter spoke to [place] from [place] and redirected her to the correct doctor who has been caring for pt's wound.

## 2021-05-22 NOTE — Progress Notes (Signed)
[facility] [place], [place] [address]  Internal MEDICINE  Telephone Visit  Patient Name: Diana Bernard  [id number]  [id number]  Date of Service: 05/28/2021  I connected with the patient at 1:40 by telephone and verified the patients identity using two identifiers.   I discussed the limitations, risks, security and privacy concerns of performing an evaluation and management service by telephone and the availability of in person appointments. I also discussed with the patient that there may be a patient responsible charge related to the service.  The patient expressed understanding and agrees to proceed.    Chief Complaint  Patient presents with   [facility] follow up    Hospitalization Follow-up    Rectum carcinoma    Telephone Screen    [id number]    HPI Pt is here for virtal hospital follow up -She was admitted from 11/11-1/18/23 due to rectal carcinoma s/p colostomy. [facility] and unfortunately those records are not fully accessible. Prior to that she had been at [facility] for her colostomy and had some complications leading to 2 wound vacs--abdominal and sacral. Abdominal wound cleared up, however was sent home with sacral wound vac. -Reports she came home Thursday and has been in a lot of pain since. -States she is having a lot of problems with urinary incontinence and leaking at wound vac over sacral wound. Her home health/wound care nurse called prior to visit questioning what to do about dressing and was advised to contact provider who was managing wound as it has never been seen or evaluated in office. -Upon further discussion with patient on call, advised to go back to hospital for further management as leaking wound vac and incontinence leading to urine leaking toward wound could delay healing and risk infection, may need catheter -breathing has been ok, using inhalers, off trulicity  Current  Medication: Outpatient Encounter Medications as of 05/22/2021  Medication Sig Note   albuterol (VENTOLIN HFA) 108 (90 Base) MCG/ACT inhaler Inhale 2 puffs into the lungs every 6 (six) hours as needed for wheezing or shortness of breath.    alendronate (FOSAMAX) 70 MG tablet Take 1 tablet (70 mg total) by mouth every 7 (seven) days. Take with a full glass of water on an empty stomach.    cholecalciferol (VITAMIN D) 1000 units tablet Take 1,000 Units by mouth daily.    Cyanocobalamin (VITAMIN B-12 PO) Take by mouth. Pt takes [place] gummies    diclofenac Sodium (VOLTAREN) 1 % GEL Apply topically 4 (four) times daily.    diphenoxylate-atropine (LOMOTIL) 2.5-0.025 MG tablet Take 1 tablet by mouth 4 (four) times daily as needed for diarrhea or loose stools.    docusate sodium (COLACE) 100 MG capsule Take 200 mg by mouth daily.  06/07/2015: Received from: [place] (TRULICITY) 1.5 VH/8.4ON SOPN Inject 1.5 mg into the skin once a week.    DULoxetine (CYMBALTA) 30 MG capsule Take 1 capsule po bid    esomeprazole (NEXIUM) 20 MG capsule Take 20 mg by mouth daily at 12 noon.    fluticasone-salmeterol (ADVAIR) 250-50 MCG/ACT AEPB Inhale 1 puff into the lungs in the morning and at bedtime.    furosemide (LASIX) 20 MG tablet Take 1 tablet (20 mg total) by mouth daily.    gabapentin (NEURONTIN) 300 MG capsule Take 1 cap in morning and 3 cap at bedtime    ipratropium-albuterol (DUONEB) 0.5-2.5 (3) MG/3ML SOLN  Take 3 mLs by nebulization every 4 (four) hours as needed.    lisinopril (ZESTRIL) 20 MG tablet Take 1 tablet (20 mg total) by mouth daily.    Magnesium 250 MG TABS Take by mouth. 06/22/2015: Reports taking once a day   metoprolol succinate (TOPROL-XL) 50 MG 24 hr tablet Take 50 mg by mouth daily. Take with or immediately following a meal.    oxyCODONE (OXY IR/ROXICODONE) 5 MG immediate release tablet Take by mouth.    rosuvastatin (CRESTOR) 10 MG tablet TAKE 1 TABLET TWICE WEEKLY     spironolactone (ALDACTONE) 25 MG tablet 25 mg daily.    No facility-administered encounter medications on file as of 05/22/2021.    Surgical History: Past Surgical History:  Procedure Laterality Date   ABCESS DRAINAGE  2016   Abdominal abcess due to diverticulitis   caridoverter defibrillator  11/14/2017   ICD   CATARACT EXTRACTION W/PHACO Left 10/26/2015   Procedure: CATARACT EXTRACTION PHACO AND INTRAOCULAR LENS PLACEMENT (IOC) LEFT EYE;  Surgeon: Leandrew Koyanagi, MD;  Location: [place];  Service: Ophthalmology;  Laterality: Left;   CATARACT EXTRACTION W/PHACO Right 12/07/2015   Procedure: CATARACT EXTRACTION PHACO AND INTRAOCULAR LENS PLACEMENT (IOC);  Surgeon: Leandrew Koyanagi, MD;  Location: [place];  Service: Ophthalmology;  Laterality: Right;   CHOLECYSTECTOMY     FOOT SURGERY     Per patient, bilateral foot surgery.   PELVIC FRACTURE SURGERY     Per patient for cancer.    Medical History: Past Medical History:  Diagnosis Date   Arthritis    Asthma    Cancer ([place])    Deep vein thrombosis (DVT) ([place]) 2015   Depression    GERD (gastroesophageal reflux disease)    Hypertension    Weakness of both legs     Family History: Family History  Problem Relation Age of Onset   Depression Sister    Heart disease Sister    Early death Sister    Depression Sister    Depression Sister    Depression Sister    Depression Sister     Social History   Socioeconomic History   Marital status: Married    Spouse name: Not on file   Number of children: Not on file   Years of education: Not on file   Highest education level: Not on file  Occupational History   Not on file  Tobacco Use   Smoking status: Never   Smokeless tobacco: Never  Vaping Use   Vaping Use: Never used  Substance and Sexual Activity   Alcohol use: No   Drug use: No   Sexual activity: Not Currently  Other Topics Concern   Not on file  Social History Narrative   Not on file    Social Determinants of Health   Financial Resource Strain: Low Risk    Difficulty of Paying Living Expenses: Not very hard  Food Insecurity: Not on file  Transportation Needs: Not on file  Physical Activity: Not on file  Stress: Not on file  Social Connections: Not on file  Intimate Partner Violence: Not on file      Review of Systems  Constitutional:  Positive for activity change and fatigue. Negative for fever.  HENT:  Negative for congestion, mouth sores and postnasal drip.   Respiratory:  Negative for cough.   Cardiovascular:  Negative for chest pain.  Genitourinary:  Negative for flank pain.       Incontinence  Musculoskeletal:  Positive for back pain.  Skin:  Positive for wound.       Sacral wound vac leaking with a lot of pain at site  Psychiatric/Behavioral: Negative.     Vital Signs: Ht 5' (1.524 m)    Wt 218 lb (98.9 kg)    BMI 42.58 kg/m    Observation/Objective:  Pt is able to carry out conversation.   Assessment/Plan: 1. Hospital discharge follow-up Reviewed hospital course at [facility] with patient however official records not available at this time  2. Rectal carcinoma ([place]) Status post colostomy, will need to continue follow-ups with oncology and surgery  3. S/P colostomy ([place]) Will need follow-up with oncology and surgery  4. Urinary incontinence, unspecified type Advised to go back to hospital due to urinary incontinence combined with wound VAC leakage and risk of infection.  Patient may need catheter or potential treatment of UTI  5. Encounter for management of wound VAC Advised to get back to hospital due to reported leaking from wound VAC in addition to urinary incontinence leaking into wound and risk of infection   General Counseling: Telisa verbalizes understanding of the findings of today's phone visit and agrees with plan of treatment. I have discussed any further diagnostic evaluation that may be needed or ordered today. We also reviewed  her medications today. she has been encouraged to call the office with any questions or concerns that should arise related to todays visit.    No orders of the defined types were placed in this encounter.   No orders of the defined types were placed in this encounter.   Time spent:30 Minutes    Dr Lavera Guise Internal medicine

## 2021-05-22 NOTE — Telephone Encounter (Signed)
Per lauren pt needs to reach out to the specialist regarding her wound in rectum since they know more about the wound and have seen the pt for this. Nimisha provided the number to Dr. Cecil Cobbs [phone]  from [facility] health care to [place] from [place]

## 2021-05-23 DIAGNOSIS — I5022 Chronic systolic (congestive) heart failure: Secondary | ICD-10-CM | POA: Diagnosis not present

## 2021-05-23 DIAGNOSIS — L89159 Pressure ulcer of sacral region, unspecified stage: Secondary | ICD-10-CM | POA: Diagnosis not present

## 2021-05-23 DIAGNOSIS — I110 Hypertensive heart disease with heart failure: Secondary | ICD-10-CM | POA: Diagnosis not present

## 2021-05-23 DIAGNOSIS — N390 Urinary tract infection, site not specified: Secondary | ICD-10-CM | POA: Diagnosis not present

## 2021-05-23 DIAGNOSIS — Z48815 Encounter for surgical aftercare following surgery on the digestive system: Secondary | ICD-10-CM | POA: Diagnosis not present

## 2021-05-24 DIAGNOSIS — L89159 Pressure ulcer of sacral region, unspecified stage: Secondary | ICD-10-CM | POA: Diagnosis not present

## 2021-05-24 DIAGNOSIS — E162 Hypoglycemia, unspecified: Secondary | ICD-10-CM | POA: Diagnosis not present

## 2021-05-24 DIAGNOSIS — N390 Urinary tract infection, site not specified: Secondary | ICD-10-CM | POA: Diagnosis not present

## 2021-05-25 DIAGNOSIS — L89159 Pressure ulcer of sacral region, unspecified stage: Secondary | ICD-10-CM | POA: Diagnosis not present

## 2021-05-25 DIAGNOSIS — Z743 Need for continuous supervision: Secondary | ICD-10-CM | POA: Diagnosis not present

## 2021-05-25 DIAGNOSIS — N390 Urinary tract infection, site not specified: Secondary | ICD-10-CM | POA: Diagnosis not present

## 2021-05-30 ENCOUNTER — Telehealth (INDEPENDENT_AMBULATORY_CARE_PROVIDER_SITE_OTHER): Payer: Medicare HMO | Admitting: Nurse Practitioner

## 2021-05-30 ENCOUNTER — Encounter: Payer: Self-pay | Admitting: Nurse Practitioner

## 2021-05-30 VITALS — Ht 60.0 in | Wt 215.0 lb

## 2021-05-30 DIAGNOSIS — R5381 Other malaise: Secondary | ICD-10-CM

## 2021-05-30 DIAGNOSIS — C20 Malignant neoplasm of rectum: Secondary | ICD-10-CM | POA: Diagnosis not present

## 2021-05-30 DIAGNOSIS — G8929 Other chronic pain: Secondary | ICD-10-CM | POA: Diagnosis not present

## 2021-05-30 DIAGNOSIS — I5022 Chronic systolic (congestive) heart failure: Secondary | ICD-10-CM

## 2021-05-30 DIAGNOSIS — Z933 Colostomy status: Secondary | ICD-10-CM

## 2021-05-30 DIAGNOSIS — R7301 Impaired fasting glucose: Secondary | ICD-10-CM | POA: Diagnosis not present

## 2021-05-30 DIAGNOSIS — M5441 Lumbago with sciatica, right side: Secondary | ICD-10-CM

## 2021-05-30 DIAGNOSIS — R296 Repeated falls: Secondary | ICD-10-CM

## 2021-05-30 DIAGNOSIS — M5442 Lumbago with sciatica, left side: Secondary | ICD-10-CM | POA: Diagnosis not present

## 2021-05-30 DIAGNOSIS — Z09 Encounter for follow-up examination after completed treatment for conditions other than malignant neoplasm: Secondary | ICD-10-CM | POA: Diagnosis not present

## 2021-05-30 MED ORDER — OXYCODONE HCL 5 MG PO TABS: 5.0000 mg | ORAL_TABLET | Freq: Four times a day (QID) | ORAL | 0 refills | Status: AC | PRN

## 2021-05-30 MED ORDER — TRULICITY 1.5 MG/0.5ML ~~LOC~~ SOAJ: 1.5000 mg | SUBCUTANEOUS | 3 refills | Status: DC

## 2021-05-30 NOTE — Progress Notes (Addendum)
[facility] Diana Bernard [id number] [facility] Discharge Acute Issues Care Follow Up                                                                        Patient Demographics  Diana Bernard, is a 76 y.o. female  DOB 1946-01-05  MRN [id number].  Primary MD  Lavera Guise, MD  I connected with the patient at 11:50 AM by telephone and verified the patients identity using two identifiers.   I discussed the limitations, risks, security and privacy concerns of performing an evaluation and management service by telephone and the availability of in person appointments. I also discussed with the patient that there may be a patient responsible charge related to the service.  The patient expressed understanding and agrees to proceed.    Reason for TCC follow Up - complicated UTI   Past Medical History:  Diagnosis Date   Arthritis    Asthma    Cancer ([place])    Deep vein thrombosis (DVT) ([place]) 2015   Depression    GERD (gastroesophageal reflux disease)    Hypertension    Weakness of both legs     Past Surgical History:  Procedure Laterality Date   ABCESS DRAINAGE  2016   Abdominal abcess due to diverticulitis   caridoverter defibrillator  11/14/2017   ICD   CATARACT EXTRACTION W/PHACO Left 10/26/2015   Procedure: CATARACT EXTRACTION PHACO AND INTRAOCULAR LENS PLACEMENT ([place]) LEFT EYE;  Surgeon: Leandrew Koyanagi, MD;  Location: [place];  Service: Ophthalmology;  Laterality: Left;   CATARACT EXTRACTION W/PHACO Right 12/07/2015   Procedure: CATARACT EXTRACTION PHACO AND INTRAOCULAR LENS PLACEMENT (IOC);  Surgeon: Leandrew Koyanagi, MD;  Location: [place];  Service: Ophthalmology;  Laterality: Right;   CHOLECYSTECTOMY     FOOT SURGERY     Per patient, bilateral foot surgery.   PELVIC FRACTURE SURGERY     Per patient for  cancer.       Recent HPI and Hospital Course  Hospital Course:   Diana Bernard is a 76 y.o. female with a history of HTN, LBBB and HFrEF 45-50%, s/p AICD, COPD, CKD, obesity, endometrial ca s/p resection, rectal adenoca s/p resection and colostomy 1/88/41 complicated by abdominal and sacral wounds requiring wound vac who presented to [facility] with lower back pain, malodorous sacral wound, and home health difficulties managing her wound vac found to have a possible UTI?   Question of UTI Patient found to have pyuria and microscopic hematuria on urinalysis with urine growing mixed gram-positive and gram-negative. Was originally started on meropenem due to history of MDR UTIs. She had no leukocytosis, fever, flank pain or other signs of complicated infection. Ultimately, unclear if this was a true UTI as symptoms appeared to be more related to pain from sacral area but the patient had been feeling a little colder at home, so the decision was made to treat with 5 days of cephalexin 750 3 times  daily.  Sacral wound from APR with transverse colostomy (01/25/21) The patient had recently been hospitalized on the surgical oncology service discharged on 1/11 with fevers and failure to thrive and a wound VAC was placed at that time and she was discharged to [facility]. She just got home from the LTAC 4 days prior to presentation and was having worsening pain. She reports that she had been receiving oxycodone at the [facility] but had not been discharged with any.Surgical oncology oncology evaluated the wounds and felt that they were healing well and did not need wound VAC any longer. Twice daily wet-to-dry dressings were suggested. She will be sent home with a short course of oxycodone for related pain to side as she had been receiving this throughout her stay at the [facility] per report but encouraged to follow-up with oncologist or another doctor who may be able to prescribe a longer course if pain is ongoing.  Hypoglycemia She  was noted to have hypoglycemias to the 50s or 60s but remained asymptomatic at the time. Thought more likely to be secondary to poor perfusion causing a falsely low point-of-care value as did not have low blood glucoses on get a lab exactly at the same time to correlate. If she develops symptoms of hypoglycemia, this should be further evaluated.  Her other regimen was given per home regimen/formulary substitution. Lisinopril initially held but resumed on one half dose prior to discharge due to low normal blood pressure.  Patient was evaluated by PT and OT and SNF was recommended although after discussion with patient and daughter the decision was made to go home with home health care.   Diana Bernard presents for a telehealth virtual visit for hospital follow up. She was hospitalized for 2 days. While she was there, her wound vac was taken off as the wound oncologist did not think she needed it anymore. According to her discharge papers, she is to stop trulicity and duloxetine but there is no reason given as to why. These 2 medications will be continued. Agree with the decrease in the lisinopril dose.  Patient is unable to walk and cannot physically come to the office for a visit.   [facility] Acute Care Issue to be followed in the Clinic  Discharge Diagnoses: Principal Problem: Urinary tract infection POA: Yes Active Problems: Rectal carcinoma (CMS-HCC) POA: Yes Resolved Problems: * No resolved hospital problems. *  Outpatient Provider Follow Up Issues:  [ ]  Evaluation of continued pain and if oxycodone remains appropriate    Subjective:   Diana Bernard today has, No headache, No chest pain, No abdominal pain - No Nausea, No new weakness tingling or numbness, No Cough - SOB. Patient fell at home right before her virtual visit and had to wait for EMS to come over to lift her off the ground. She reports that she feels sore but that's all. EMS assessed her and did not find any injuries that would  warrant a trip to the ER. She cannot walk. She reports that she does not feel confused and is not having any symptoms of a UTI.   Assessment & Plan   1. Hospital discharge follow-up UTI has resolved. Wound vac was removed in the hospital. Hospitalist discontinued her duloxetine and trulicity. After discussing with patient, both of those medications will continue. She takes duloxetine for depression and anxiety. She takes trulicity for impaired fasting glucose.  Home health ordered for nursing and physical and occupational therapy.   2. Rectal carcinoma ([place]) She is being  followed by oncology.   3. S/P colostomy ([place]) She has a permanent colostomy  4. Impaired fasting glucose Continue medication for now.  - Dulaglutide (TRULICITY) 1.5 WU/9.8JX SOPN; Inject 1.5 mg into the skin once a week.  Dispense: 6 mL; Refill: 3  5. Chronic bilateral low back pain with bilateral sciatica Patient continues to have pain, refill ordered.  - oxyCODONE (OXY IR/ROXICODONE) 5 MG immediate release tablet; Take 1 tablet (5 mg total) by mouth every 6 (six) hours as needed for up to 5 days for severe pain.  Dispense: 20 tablet; Refill: 0 - Ambulatory referral to Home Health  6. Chronic systolic heart failure ([place]) Home health ordered - Ambulatory referral to Home Health  7. Physical deconditioning Home health ordered with home PT - Ambulatory referral to [place]  8. Falls frequently Home health PT ordered - Ambulatory referral to [place]    Reason for frequent admissions/ER visits    chronic systolic heart failure, falls, COPD, cancer, hypertension, UTI, increased risk of infection   Objective:   Vitals:   05/30/21 1116  Weight: 215 lb (97.5 kg)  Height: 5' (1.524 m)    Wt Readings from Last 3 Encounters:  05/30/21 215 lb (97.5 kg)  05/22/21 218 lb (98.9 kg)  12/29/20 253 lb (114.8 kg)    Allergies as of 05/30/2021   No Known Allergies      Medication List         Accurate as of May 30, 2021 11:46 AM. If you have any questions, ask your nurse or doctor.          albuterol 108 (90 Base) MCG/ACT inhaler Commonly known as: VENTOLIN HFA Inhale 2 puffs into the lungs every 6 (six) hours as needed for wheezing or shortness of breath.   alendronate 70 MG tablet Commonly known as: FOSAMAX Take 1 tablet (70 mg total) by mouth every 7 (seven) days. Take with a full glass of water on an empty stomach.   cholecalciferol 1000 units tablet Commonly known as: VITAMIN D Take 1,000 Units by mouth daily.   diclofenac Sodium 1 % Gel Commonly known as: VOLTAREN Apply topically 4 (four) times daily.   diphenoxylate-atropine 2.5-0.025 MG tablet Commonly known as: LOMOTIL Take 1 tablet by mouth 4 (four) times daily as needed for diarrhea or loose stools.   docusate sodium 100 MG capsule Commonly known as: COLACE Take 200 mg by mouth daily.   DULoxetine 30 MG capsule Commonly known as: CYMBALTA Take 1 capsule po bid   esomeprazole 20 MG capsule Commonly known as: NEXIUM Take 20 mg by mouth daily at 12 noon.   fluticasone-salmeterol 250-50 MCG/ACT Aepb Commonly known as: ADVAIR Inhale 1 puff into the lungs in the morning and at bedtime.   furosemide 20 MG tablet Commonly known as: LASIX Take 1 tablet (20 mg total) by mouth daily.   gabapentin 300 MG capsule Commonly known as: NEURONTIN Take 1 cap in morning and 3 cap at bedtime   ipratropium-albuterol 0.5-2.5 (3) MG/3ML Soln Commonly known as: DUONEB Take 3 mLs by nebulization every 4 (four) hours as needed.   lisinopril 20 MG tablet Commonly known as: ZESTRIL Take 1 tablet (20 mg total) by mouth daily.   Magnesium 250 MG Tabs Take by mouth.   metoprolol succinate 50 MG 24 hr tablet Commonly known as: TOPROL-XL Take 50 mg by mouth daily. Take with or immediately following a meal.   oxyCODONE 5 MG immediate release tablet Commonly known as: Oxy  IR/ROXICODONE Take by mouth.    rosuvastatin 10 MG tablet Commonly known as: CRESTOR TAKE 1 TABLET TWICE WEEKLY   spironolactone 25 MG tablet Commonly known as: ALDACTONE 25 mg daily.   Trulicity 1.5 IW/5.8KD Sopn Generic drug: Dulaglutide Inject 1.5 mg into the skin once a week.   VITAMIN B-12 PO Take by mouth. Pt takes [place] gummies         Physical Exam: Constitutional: Patient appears well-developed and well-nourished. Not in obvious distress. HENT: Normocephalic, atraumatic, External right and left ear normal. Oropharynx is clear and moist.  Eyes: Conjunctivae and EOM are normal. PERRLA, no scleral icterus. Neck: Normal ROM. Neck supple. No JVD. No tracheal deviation. No thyromegaly. CVS: RRR, S1/S2 +, no murmurs, no gallops, no carotid bruit.  Pulmonary: Effort and breath sounds normal, no stridor, rhonchi, wheezes, rales.  Abdominal: Soft. BS +, no distension, tenderness, rebound or guarding.  Musculoskeletal: Normal range of motion. No edema and no tenderness.  Lymphadenopathy: No lymphadenopathy noted, cervical, inguinal or axillary Neuro: Alert. Normal reflexes, muscle tone coordination. No cranial nerve deficit. Skin: Skin is warm and dry. No rash noted. Not diaphoretic. No erythema. No pallor. Psychiatric: Normal mood and affect. Behavior, judgment, thought content normal.   Data Review   Micro Results No results found for this or any previous visit (from the past 240 hour(s)).   CBC No results for input(s): WBC, HGB, HCT, PLT, MCV, MCH, MCHC, RDW, LYMPHSABS, MONOABS, EOSABS, BASOSABS, BANDABS in the last 168 hours.  Invalid input(s): NEUTRABS, BANDSABD  Chemistries  No results for input(s): NA, K, CL, CO2, GLUCOSE, BUN, CREATININE, CALCIUM, MG, AST, ALT, ALKPHOS, BILITOT in the last 168 hours.  Invalid input(s): GFRCGP ------------------------------------------------------------------------------------------------------------------ CrCl cannot be calculated (Patient's most  recent lab result is older than the maximum 21 days allowed.). ------------------------------------------------------------------------------------------------------------------ No results for input(s): HGBA1C in the last 72 hours. ------------------------------------------------------------------------------------------------------------------ No results for input(s): CHOL, HDL, LDLCALC, TRIG, CHOLHDL, LDLDIRECT in the last 72 hours. ------------------------------------------------------------------------------------------------------------------ No results for input(s): TSH, T4TOTAL, T3FREE, THYROIDAB in the last 72 hours.  Invalid input(s): FREET3 ------------------------------------------------------------------------------------------------------------------ No results for input(s): VITAMINB12, FOLATE, FERRITIN, TIBC, IRON, RETICCTPCT in the last 72 hours.  Coagulation profile No results for input(s): INR, PROTIME in the last 168 hours.  No results for input(s): DDIMER in the last 72 hours.  Cardiac Enzymes No results for input(s): CKMB, TROPONINI, MYOGLOBIN in the last 168 hours.  Invalid input(s): CK ------------------------------------------------------------------------------------------------------------------ Invalid input(s): POCBNP  No follow-ups on file.   Time Spent in minutes  45 Time spent with patient included reviewing progress notes, labs, imaging studies, and discussing plan for follow up.   This patient was seen by Jonetta Osgood, FNP-C in collaboration with Dr. Clayborn Bigness as a part of collaborative care agreement.   Jonetta Osgood MSN, FNP-C on 05/30/2021 at 11:46 AM   **Disclaimer: This note may have been dictated with voice recognition software. Similar sounding words can inadvertently be transcribed and this note may contain transcription errors which may not have been corrected upon publication of note.**

## 2021-05-31 ENCOUNTER — Ambulatory Visit: Payer: Medicare HMO | Admitting: Student-PharmD

## 2021-05-31 ENCOUNTER — Telehealth: Payer: Self-pay

## 2021-05-31 ENCOUNTER — Other Ambulatory Visit: Payer: Self-pay

## 2021-05-31 DIAGNOSIS — I10 Essential (primary) hypertension: Secondary | ICD-10-CM

## 2021-05-31 DIAGNOSIS — I5022 Chronic systolic (congestive) heart failure: Secondary | ICD-10-CM

## 2021-05-31 NOTE — Progress Notes (Signed)
Follow Up Pharmacist Visit   Diana Bernard, Diana Bernard  [id number] 44 years, Female  DOB: 26-Sep-1945  M: [phone]  Clinical Summary Situation:: Patient presents via telephone for CCM F/u visit Background:: Patient recently discharged from hospital. She cannot stand right now and is waiting to hear back from PT to try and regain mobility. Pt does check her blood pressure daily and reports 130s/60s regularly. Reports adherence to her medications. Trulicity is cost prohibitive. Assessment:: Pt's BP is well-controlled. Unable to assess patient weight due to her inability to stand on scale. Reports adherence to osteoporosis medications. Reports recently fall with in injuries. Recommendations::  -Continue medication therapy -Pursue PAP for trulicity -Pharmacist f/u in 4 months  Patient Chart Prep  Completed by Charlann Lange on 05/31/2021  Chronic Conditions Patient's Chronic Conditions: Hypertension (HTN), Systolic Heart Failure / HFrEF, Hypothyroidism, Osteopenia or Osteoporosis  Doctor and Hospital Visits Were there PCP Visits since last visit with the Pharmacist?: Yes Visit #1: 05/22/21 Video Visit McDonough, Si Gaul, PA-C. For hospital discharge. No medication changes. Were there Specialist Visits since last visit with the Pharmacist?: No Was there a Hospital Visit in last 30 days?: Yes Reason for admission: Wound of sacral region Admit Date: 05/23/2021 Discharge Date: 05/23/2021 Location Discharged from: [facility] New Medications Started at hospital discharge (list medications started): cephalexin 250 MG capsule Take 3 capsules (750 mg total) by mouth every eight (8) hours for 4 days and polyethylene glycol 17 gram/dose powder Mix (17 g) in 4-8 ounces of liquid and drink it by mouth daily. Medication Changes at hospital discharge (list medications changed): lisinopriL 20 MG tablet Take 0.5 tablets (10 mg total) by mouth daily. Hold for SBP <120 and oxyCODONE 5 MG  immediate release tablet Take 1 tablet (5 mg total) by mouth every six (6) hours as needed for pain for up to 5 days Medications Discontinued at hospital discharge (list medications discontinued): DULoxetine 30 MG capsule and TRULICITY 3.53 IR/4.4 mL injection pen. Medications that remain the same after Hospital Discharge (list medications continued): acetaminophen 500 MG tablet Take 2 tablets (1,000 mg total) by mouth every eight (8) hours. albuterol 90 mcg/actuation inhaler Inhale 2 puffs. alendronate 70 MG tablet Take 70 mg by mouth every seven (7) days. WEDNESDAYS diclofenac sodium 1 % gel APPLY 2 G TOPICALLY FOUR (4) TIMES A DAY. esomeprazole 40 MG capsule Take 40 mg by mouth every morning before breakfast. fluticasone propion-salmeteroL 250-50 mcg/dose diskus Inhale 1 puff in the morning and 1 puff in the evening. furosemide 20 MG tablet Take 1 tablet (20 mg total) by mouth daily as needed. As needed for edema/hypervolemia. gabapentin 300 MG capsule 3 tablets at night and 1 tablet in the morning and at midday ipratropium-albuteroL 0.5-2.5 mg/3 mL nebulizer Inhale 3 mL every six (6) hours as needed. lidocaine 4 % patch Place 1 patch on the skin daily. magnesium 250 mg Tab Take 250 mg by mouth daily at 0600 metoprolol succinate 50 MG 24 hr tablet Take 1 tablet (50 mg total) by mouth in the morning. multivitamin therapeutic with minerals 27-0.4 mg Tab Take 1 tablet by mouth daily. PRO-STAT SUGAR FREE 15 gram- 100 kcal/30 mL Lipk packet Take 1 packet by mouth Two (2) times a day. rosuvastatin 10 MG tablet Take 10 mg by mouth 2 times a week (Tuesday and Saturday). venlafaxine 37.5 MG tablet Take 1 tablet (37.5 mg total) by mouth daily. vitamin B-12 500 MCG tablet Take 500 mcg by mouth daily. Were there other Hospital Visits  since last visit with the Pharmacist?: No  Medication Information Have there been any medication changes from PCP or Specialist since last visit with the Pharmacist?:  No Are there any Medication adherence gaps (beyond 5 days past due)?: No Medication adherence rates for the STAR rating drugs: Trulicity 1.5 ZS/0.1 ML SOPN 01/14/21 28 DS Rosuvastatin 10 mg 04/27/21 90 DS Lisinopril 20 mg 05/10/21 90 DS List Patient's current Care Gaps: No current Care Gaps identified  Pre-Call Questions Completed by Charlann Lange on 05/31/2021 09:52 AM Are you able to connect with Patient?: Yes Visit Type: Phone Call May we confirm what is the best phone # for the pharmacist to call you?: 05/31/21 at 3:30 PM [phone]  Have you been in the hospital or emergency room recently?: Yes Details: 05/23/21 [place] Adolph Pollack, MD  For Wound of sacral region. Do you have any questions or concerns that you want to make sure your pharmacist addresses with your during your appointment?: No What, if any, problems do you have getting your medications from the pharmacy?: Financial barriers Details: Patient stated her Trulicity is expensive. Is there anything else that you would like me to pass along to your pharmacist or PCP?: No Patient reminded to bring medication bottles to visit (not a list of meds) OR have medication bottles pulled out prior to appointment time: Done  Disease Assessments  Subjective Information Current BP: 124/56 Current HR: 82 taken on: 05/23/2021 Weight: 107.6 BMI: 46.33 Last GFR: 84 taken on: 05/22/2021 Why did the patient present?: CCM F/U Visit Factors that may affect medication adherence?: Pill burden, Financial hardship (medication copays) Is Patient using [facility]?: No Name and location of Current pharmacy: [facility] Current Rx insurance plan: [employer] Are meds synced by current pharmacy?: No Are meds delivered by current pharmacy?: Yes - by mail order pharmacy Would patient benefit from direct intervention of clinical lead in dispensing process to optimize clinical outcomes?: Yes Are [facility] pharmacy  services available where patient lives?: Yes Is patient disadvantaged to use [facility]?: Yes Does patient experience delays in picking up medications due to transportation concerns (getting to pharmacy)?: No Any additional demeanor/mood notes?: Patient is very pleasant to speak with; was recently discharged from hospital for complicated UTI  Heart Failure with Reduced Ejection Fraction (HFrEF) Last BNP: 28.4 taken on: 03/09/2020 Assess this condition today?: Yes Reviewed last eGFR/SCr: Yes Reviewed last Potassium lab: Yes Today's weight:: Patient unable to weigh herself NYHA Class: II (slight limitation of activity) ACC/AHA stage: C (Heart disease and symptoms present) Is patient on ACE/ARB?: Yes Is patient using a Beta Blocker?: Yes Select which specific one patient is using: metoprolol succinate Is the dosage maximized: Yes Is patient a candidate for Spironolactone?: Yes Patient checking daily weights?: No Why is patient not weighing?: patient unable to stand currently; has referral out to PT Does Patient use RPM device?: No RPM Scale: Does patient qualify?: No We discussed: Sodium restriction, Daily weight monitoring Assessment:: Controlled Drug: Furosemide 20mg  1 tablet daily Assessment: Appropriate, Effective, Safe, Accessible Drug: Lisinopril 20mg  1 tablet daily Assessment: Appropriate, Effective, Safe, Accessible Drug: Metoprolol XL 50mg  1 tablet daily Assessment: Appropriate, Effective, Safe, Accessible Drug: Spironolactone 25mg  1 tablet daily Assessment: Appropriate, Effective, Safe, Accessible Additional Info: Pt does where compression boots at night that help with her swelling. Checks her blood pressure daily with assistance from daughter and reports BPs that are consistently 130s/60s. Stressed importance of daily weights to patient if she can regain ability  to stand with PT. Will provide handout on chair exercises so patient can try to be active from  bed/wheelchair. Plan to: Continue medication therapy HC Follow up: 2 months Pharmacist Follow up: 4 months  Osteopenia or Osteoporosis Current T-score: -2.7 on left femur taken on: 10/12/2019 Current Vitamin D 25-OH: 26.8 taken on: 04/13/2019 Assess this condition today?: Yes Patient has: Osteoporosis In the past 12 months, have you fallen?: Yes What were the circumstances?: Pt fell out of bed yesterday. She did not injure herself. How many times have you fallen?: that is only time recently that she can recall Are there any stairs in or around the home?: No Is the home free of loose throw rugs in walkways, pet beds, electrical cords, etc.?: Yes Is there adequate lighting in your home to reduce the risk of falls?: Yes Dietary calcium intake: Last calcium lab was elevated at 67.5-FF calicum supplement Bisphosphonate start: 10/13/2019 We discussed: Fall prevention, Proper administration of bisphosphonate Assessment:: Controlled Drug: Alendronate $RemoveBeforeDEI'70mg'[id number]$  once weekly Assessment: Appropriate, Effective, Safe, Accessible Drug: Vitamin D 1000 units daily Assessment: Appropriate, Effective, Safe, Accessible HC Follow up: 2 months Pharmacist Follow up: 4 months  Exercise, Diet and Non-Drug Coordination Needs Additional exercise counseling points. We discussed: targeting at least 151 minutes per week of moderate-intensity aerobic exercise. Additional diet counseling points. We discussed: key components of the DASH diet Discussed Non-Drug Care Coordination Needs: Yes Does Patient have Medication financial barriers?: Yes Patient has insurance: [employer] and has trouble affording medications  (list number): Trulicity Goal: (pharmacist to fill in plan to resolve) i.e. change medications, samples, PAP.: pursue PAP Patient meets income/out of pocket spend criteria for this medications patient assistance program. Reviewed application process, patient will provide proof of income, out of pocket spend  report, and will sign application.  Will collaborate with PCP, and will submit for patient.: Yes  [facility] Health-Related Social Needs Screening Tool -  SDOH  What is your living situation today? (ref #1): I have a steady place to live Think about the place you live. Do you have problems with any of the following? (ref #2): None of the above Within the past 12 months, you worried that your food would run out before you got money to buy more (ref #3): Never true Within the past 12 months, the food you bought just didn't last and you didn't have money to get more (ref #4): Never true In the past 12 months, has lack of reliable transportation kept you from medical appointments, meetings, work or from getting things needed for daily living? (ref #5): No In the past 12 months, has the electric, gas, oil, or water company threatened to shut off services in your home? (ref #6): No How often does anyone, including family and friends, physically hurt you? (ref #7): Never (1) How often does anyone, including family and friends, insult or talk down to you? (ref #8): Never (1) How often does anyone, including friends and family, threaten you with harm? (ref #9): Never (1) How often does anyone, including family and friends, scream or curse at you? (ref #10): Never (1)  Exercises to do While Sitting Silhouette of a female drinking a glass of water with ice.  Exercises that you do while sitting (chair exercises) can give you many of the same benefits as full exercise. Benefits include strengthening your heart, burning calories, and keeping muscles and joints healthy. Exercise can also improve your mood and help with depression and anxiety. You may benefit from  chair exercises if you are unable to do standing exercises due to: Diabetic foot pain. Obesity. Illness. Arthritis. Recovery from surgery or injury. Breathing problems. Balance problems. Another type of disability. Before  starting chair exercises, check with your health care provider or a physical therapist to find out how much exercise you can tolerate and which exercises are safe for you. If your health care provider approves: Start out slowly and build up over time. Aim to work up to about 10-20 minutes for each exercise session. Make exercise part of your daily routine. Drink water when you exercise. Do not wait until you are thirsty. Drink every 10-15 minutes. Stop exercising right away if you have pain, nausea, shortness of breath, or dizziness. If you are exercising in a wheelchair, make sure to lock the wheels. Ask your health care provider whether you can do tai chi or yoga. Many positions in these mind-body exercises can be modified to do while seated. Warm-up Before starting other exercises: Sit up as straight as you can. Have your knees bent at 90 degrees, which is the shape of the capital letter "L." Keep your feet flat on the floor. Sit at the front edge of your chair, if you can. Pull in (tighten) the muscles in your abdomen and stretch your spine and neck as straight as you can. Hold this position for a few minutes. Breathe in and out evenly. Try to concentrate on your breathing, and relax your mind. Stretching Exercise A: Arm stretch Hold your arms out straight in front of your body. Bend your hands at the wrist with your fingers pointing up, as if signaling someone to stop. Notice the slight tension in your forearms as you hold the position. Keeping your arms out and your hands bent, rotate your hands outward as far as you can and hold this stretch. Aim to have your thumbs pointing up and your pinkie fingers pointing down. Slowly repeat arm stretches for one minute as tolerated. Exercise B: Leg stretch If you can move your legs, try to "draw" letters on the floor with the toes of your foot. Write your name with one foot. Write your name with the toes of your other foot. Slowly repeat the  movements for one minute as tolerated. Exercise C: Reach for the sky Reach your hands as far over your head as you can to stretch your spine. Move your hands and arms as if you are climbing a rope. Slowly repeat the movements for one minute as tolerated. Range of motion exercises Exercise A: Shoulder roll Let your arms hang loosely at your sides. Lift just your shoulders up toward your ears, then let them relax back down. When your shoulders feel loose, rotate your shoulders in backward and forward circles. Do shoulder rolls slowly for one minute as tolerated. Exercise B: March in place As if you are marching, pump your arms and lift your legs up and down. Lift your knees as high as you can. If you are unable to lift your knees, just pump your arms and move your ankles and feet up and down. March in place for one minute as tolerated. Exercise C: Seated jumping jacks Let your arms hang down straight. Keeping your arms straight, lift them up over your head. Aim to point your fingers to the ceiling. While you lift your arms, straighten your legs and slide your heels along the floor to your sides, as wide as you can. As you bring your arms back down to your sides,  slide your legs back together. If you are unable to use your legs, just move your arms. Slowly repeat seated jumping jacks for one minute as tolerated. Strengthening exercises Exercise A: Shoulder squeeze Hold your arms straight out from your body to your sides, with your elbows bent and your fists pointed at the ceiling. Keeping your arms in the bent position, move them forward so your elbows and forearms meet in front of your face. Open your arms back out as wide as you can with your elbows still bent, until you feel your shoulder blades squeezing together. Hold for 5 seconds. Slowly repeat the movements forward and backward for one minute as tolerated. Contact a health care provider if: You have to stop exercising due to any of  the following: Pain. Nausea. Shortness of breath. Dizziness. Fatigue. You have significant pain or soreness after exercising. Get help right away if: You have chest pain. You have difficulty breathing. These symptoms may represent a serious problem that is an emergency. Do not wait to see if the symptoms will go away. Get medical help right away. Call your local emergency services (911 in the [place] Do not drive yourself to the hospital. Summary Exercises that you do while sitting (chair exercises) can strengthen your heart, burn calories, and keep muscles and joints healthy. You may benefit from chair exercises if you are unable to do standing exercises due to diabetic foot pain, obesity, recovery from surgery or injury, or other conditions. Before starting chair exercises, check with your health care provider or a physical therapist to find out how much exercise you can tolerate and which exercises are safe for you. This information is not intended to replace advice given to you by your health care provider. Make sure you discuss any questions you have with your health care provider.      Alena Bills Clinical Pharmacist [phone]

## 2021-06-01 ENCOUNTER — Telehealth: Payer: Self-pay | Admitting: Student-PharmD

## 2021-06-01 ENCOUNTER — Other Ambulatory Visit: Payer: Self-pay | Admitting: Physician Assistant

## 2021-06-01 ENCOUNTER — Telehealth: Payer: Self-pay

## 2021-06-01 ENCOUNTER — Telehealth: Payer: Medicare HMO | Admitting: Physician Assistant

## 2021-06-01 NOTE — Progress Notes (Signed)
°  Chronic Care Management Pharmacy Assistant   Name: Diana Bernard  MRN: [id number] DOB: 1945-08-02   Reason for Encounter: PAP  Medications: Outpatient Encounter Medications as of 06/01/2021  Medication Sig Note   albuterol (VENTOLIN HFA) 108 (90 Base) MCG/ACT inhaler Inhale 2 puffs into the lungs every 6 (six) hours as needed for wheezing or shortness of breath.    alendronate (FOSAMAX) 70 MG tablet Take 1 tablet (70 mg total) by mouth every 7 (seven) days. Take with a full glass of water on an empty stomach.    cholecalciferol (VITAMIN D) 1000 units tablet Take 1,000 Units by mouth daily.    Cyanocobalamin (VITAMIN B-12 PO) Take by mouth. Pt takes [place] gummies    diclofenac Sodium (VOLTAREN) 1 % GEL Apply topically 4 (four) times daily.    diphenoxylate-atropine (LOMOTIL) 2.5-0.025 MG tablet Take 1 tablet by mouth 4 (four) times daily as needed for diarrhea or loose stools.    docusate sodium (COLACE) 100 MG capsule Take 200 mg by mouth daily.  06/07/2015: Received from: [place] (TRULICITY) 1.5 XB/1.4NW SOPN Inject 1.5 mg into the skin once a week.    DULoxetine (CYMBALTA) 30 MG capsule Take 1 capsule po bid    esomeprazole (NEXIUM) 20 MG capsule Take 20 mg by mouth daily at 12 noon.    fluticasone-salmeterol (ADVAIR) 250-50 MCG/ACT AEPB Inhale 1 puff into the lungs in the morning and at bedtime.    furosemide (LASIX) 20 MG tablet Take 1 tablet (20 mg total) by mouth daily.    gabapentin (NEURONTIN) 300 MG capsule Take 1 cap in morning and 3 cap at bedtime    ipratropium-albuterol (DUONEB) 0.5-2.5 (3) MG/3ML SOLN Take 3 mLs by nebulization every 4 (four) hours as needed.    lisinopril (ZESTRIL) 20 MG tablet Take 1 tablet (20 mg total) by mouth daily.    Magnesium 250 MG TABS Take by mouth. 06/22/2015: Reports taking once a day   metoprolol succinate (TOPROL-XL) 50 MG 24 hr tablet Take 50 mg by mouth daily. Take with or immediately following a meal.     oxyCODONE (OXY IR/ROXICODONE) 5 MG immediate release tablet Take 1 tablet (5 mg total) by mouth every 6 (six) hours as needed for up to 5 days for severe pain.    rosuvastatin (CRESTOR) 10 MG tablet TAKE 1 TABLET TWICE WEEKLY    spironolactone (ALDACTONE) 25 MG tablet 25 mg daily.    No facility-administered encounter medications on file as of 06/01/2021.    New patient assistance application form filled out to [employer] for [employer]. Waiting for patient and provider to complete and sign documentation. Called patient to inquire if they wanted the application mailed to them or if they wanted to come into the office. Patient is required to sign application and to bring/have proof of income. She stated she would be willing to come into office to bring proof of income and sign application once all paperwork has came in the mail she would like it mailed to their residence address [address] [place] [address].   Charlann Lange, [place]  [phone]

## 2021-06-02 NOTE — Addendum Note (Signed)
Addended by: Jonetta Osgood on: 06/02/2021 08:29 AM   Modules accepted: Orders

## 2021-06-02 NOTE — Telephone Encounter (Signed)
Order is in  thanks

## 2021-06-05 ENCOUNTER — Telehealth: Payer: Self-pay

## 2021-06-05 NOTE — Telephone Encounter (Signed)
Send [facility] message for home health referral

## 2021-06-06 ENCOUNTER — Telehealth: Payer: Self-pay

## 2021-06-06 DIAGNOSIS — J069 Acute upper respiratory infection, unspecified: Secondary | ICD-10-CM

## 2021-06-06 DIAGNOSIS — R051 Acute cough: Secondary | ICD-10-CM

## 2021-06-06 DIAGNOSIS — U071 COVID-19: Secondary | ICD-10-CM

## 2021-06-06 MED ORDER — MOLNUPIRAVIR EUA 200MG CAPSULE: 4.0000 | ORAL_CAPSULE | Freq: Two times a day (BID) | ORAL | 0 refills | Status: AC

## 2021-06-06 MED ORDER — BENZONATATE 200 MG PO CAPS: 200.0000 mg | ORAL_CAPSULE | Freq: Two times a day (BID) | ORAL | 0 refills | Status: DC | PRN

## 2021-06-06 NOTE — Telephone Encounter (Signed)
Pt advised that we send med and also drink plenty of water and rest and if she is not feeling better she can call us back

## 2021-06-09 ENCOUNTER — Telehealth: Payer: Self-pay | Admitting: Student-PharmD

## 2021-06-09 NOTE — Progress Notes (Signed)
°  Chronic Care Management Pharmacy Assistant   Name: Diana Bernard  MRN: [id number] DOB: [birth year]   Reason for Encounter: Care Plan  Medications: Outpatient Encounter Medications as of 06/09/2021  Medication Sig Note   albuterol (VENTOLIN HFA) 108 (90 Base) MCG/ACT inhaler Inhale 2 puffs into the lungs every 6 (six) hours as needed for wheezing or shortness of breath.    alendronate (FOSAMAX) 70 MG tablet Take 1 tablet (70 mg total) by mouth every 7 (seven) days. Take with a full glass of water on an empty stomach.    benzonatate (TESSALON) 200 MG capsule Take 1 capsule (200 mg total) by mouth 2 (two) times daily as needed for cough.    cholecalciferol (VITAMIN D) 1000 units tablet Take 1,000 Units by mouth daily.    Cyanocobalamin (VITAMIN B-12 PO) Take by mouth. Pt takes [place] gummies    diclofenac Sodium (VOLTAREN) 1 % GEL Apply topically 4 (four) times daily.    diphenoxylate-atropine (LOMOTIL) 2.5-0.025 MG tablet Take 1 tablet by mouth 4 (four) times daily as needed for diarrhea or loose stools.    docusate sodium (COLACE) 100 MG capsule Take 200 mg by mouth daily.  06/07/2015: Received from: [place] (TRULICITY) 1.5 HD/6.2IW SOPN Inject 1.5 mg into the skin once a week.    DULoxetine (CYMBALTA) 30 MG capsule Take 1 capsule po bid    esomeprazole (NEXIUM) 20 MG capsule Take 20 mg by mouth daily at 12 noon.    fluticasone-salmeterol (ADVAIR) 250-50 MCG/ACT AEPB Inhale 1 puff into the lungs in the morning and at bedtime.    furosemide (LASIX) 20 MG tablet Take 1 tablet (20 mg total) by mouth daily.    gabapentin (NEURONTIN) 300 MG capsule Take 1 cap in morning and 3 cap at bedtime    ipratropium-albuterol (DUONEB) 0.5-2.5 (3) MG/3ML SOLN Take 3 mLs by nebulization every 4 (four) hours as needed.    lisinopril (ZESTRIL) 20 MG tablet Take 1 tablet (20 mg total) by mouth daily.    Magnesium 250 MG TABS Take by mouth. 06/22/2015: Reports taking once a day    metoprolol succinate (TOPROL-XL) 50 MG 24 hr tablet Take 50 mg by mouth daily. Take with or immediately following a meal.    molnupiravir EUA (LAGEVRIO) 200 mg CAPS capsule Take 4 capsules (800 mg total) by mouth 2 (two) times daily for 5 days.    rosuvastatin (CRESTOR) 10 MG tablet TAKE 1 TABLET TWICE WEEKLY    spironolactone (ALDACTONE) 25 MG tablet 25 mg daily.    No facility-administered encounter medications on file as of 06/09/2021.    Reviewed the patients visit reinsured it was completed per the pharmacist Diana Bernard request. Printed the CCM care plan. Mailed the patient CCM care plan to their most recent address on file.  Charlann Lange, [place]  [phone]

## 2021-06-13 ENCOUNTER — Other Ambulatory Visit: Payer: Self-pay

## 2021-06-13 ENCOUNTER — Telehealth: Payer: Self-pay

## 2021-06-13 MED ORDER — GABAPENTIN 300 MG PO CAPS: ORAL_CAPSULE | ORAL | 1 refills | Status: DC

## 2021-06-14 NOTE — Telephone Encounter (Signed)
Pt called for gabapentin refills as per alyssa is ok to send pres

## 2021-06-16 ENCOUNTER — Other Ambulatory Visit: Payer: Self-pay

## 2021-06-16 DIAGNOSIS — J45991 Cough variant asthma: Secondary | ICD-10-CM

## 2021-06-16 MED ORDER — IPRATROPIUM-ALBUTEROL 0.5-2.5 (3) MG/3ML IN SOLN: 3.0000 mL | RESPIRATORY_TRACT | 1 refills | Status: DC | PRN

## 2021-06-19 ENCOUNTER — Telehealth: Payer: Self-pay

## 2021-06-19 ENCOUNTER — Telehealth (INDEPENDENT_AMBULATORY_CARE_PROVIDER_SITE_OTHER): Payer: Medicare HMO | Admitting: Nurse Practitioner

## 2021-06-19 ENCOUNTER — Encounter: Payer: Self-pay | Admitting: Nurse Practitioner

## 2021-06-19 VITALS — Resp 16 | Ht 60.0 in | Wt 215.0 lb

## 2021-06-19 DIAGNOSIS — M10079 Idiopathic gout, unspecified ankle and foot: Secondary | ICD-10-CM | POA: Diagnosis not present

## 2021-06-19 MED ORDER — COLCHICINE 0.6 MG PO TABS: 0.6000 mg | ORAL_TABLET | Freq: Every day | ORAL | 2 refills | Status: DC | PRN

## 2021-06-19 NOTE — Progress Notes (Signed)
[facility], [place] [address]  Internal MEDICINE  Telephone Visit  Patient Name: Diana Bernard  [id number]  [id number]  Date of Service: 06/19/2021  I connected with the patient at 3:30 PM by telephone and verified the patients identity using two identifiers.   I discussed the limitations, risks, security and privacy concerns of performing an evaluation and management service by telephone and the availability of in person appointments. I also discussed with the patient that there may be a patient responsible charge related to the service.  The patient expressed understanding and agrees to proceed.    Chief Complaint  Patient presents with   Acute Visit   Gout   Telephone Assessment    video   Telephone Screen    [phone], pts cell [phone]    HPI Diana Bernard presents for a telehealth virtual visit for gout. Patient is unable to come into the office due to mobility and transportation issues. She is being assisted by her granddaughter for the video call. She reports have pain in the bottom of both of her feet and when this happened in the hospital in November she was given something for gout but she cannot remember what it is. She has a uric acid level drawn which was 6.6. She is asking for medicine for gout to alleviate her foot pain.    Current Medication: Outpatient Encounter Medications as of 06/19/2021  Medication Sig Note   albuterol (VENTOLIN HFA) 108 (90 Base) MCG/ACT inhaler Inhale 2 puffs into the lungs every 6 (six) hours as needed for wheezing or shortness of breath.    alendronate (FOSAMAX) 70 MG tablet Take 1 tablet (70 mg total) by mouth every 7 (seven) days. Take with a full glass of water on an empty stomach.    benzonatate (TESSALON) 200 MG capsule Take 1 capsule (200 mg total) by mouth 2 (two) times daily as needed for cough.    cholecalciferol (VITAMIN D) 1000 units tablet Take 1,000 Units by mouth daily.    colchicine 0.6  MG tablet Take 1 tablet (0.6 mg total) by mouth daily as needed (gout flare).    Cyanocobalamin (VITAMIN B-12 PO) Take by mouth. Pt takes [place] gummies    diclofenac Sodium (VOLTAREN) 1 % GEL Apply topically 4 (four) times daily.    diphenoxylate-atropine (LOMOTIL) 2.5-0.025 MG tablet Take 1 tablet by mouth 4 (four) times daily as needed for diarrhea or loose stools.    docusate sodium (COLACE) 100 MG capsule Take 200 mg by mouth daily.  06/07/2015: Received from: [place] (TRULICITY) 1.5 LF/8.1OF SOPN Inject 1.5 mg into the skin once a week.    DULoxetine (CYMBALTA) 30 MG capsule Take 1 capsule po bid    esomeprazole (NEXIUM) 20 MG capsule Take 20 mg by mouth daily at 12 noon.    fluticasone-salmeterol (ADVAIR) 250-50 MCG/ACT AEPB Inhale 1 puff into the lungs in the morning and at bedtime.    furosemide (LASIX) 20 MG tablet Take 1 tablet (20 mg total) by mouth daily.    gabapentin (NEURONTIN) 300 MG capsule Take 1 cap in morning and 3 cap at bedtime    ipratropium-albuterol (DUONEB) 0.5-2.5 (3) MG/3ML SOLN Take 3 mLs by nebulization every 4 (four) hours as needed.    lisinopril (ZESTRIL) 20 MG tablet Take 1 tablet (20 mg total) by mouth daily.    Magnesium 250 MG TABS Take by mouth. 06/22/2015: Reports taking once a day   metoprolol  succinate (TOPROL-XL) 50 MG 24 hr tablet Take 50 mg by mouth daily. Take with or immediately following a meal.    rosuvastatin (CRESTOR) 10 MG tablet TAKE 1 TABLET TWICE WEEKLY    spironolactone (ALDACTONE) 25 MG tablet 25 mg daily.    No facility-administered encounter medications on file as of 06/19/2021.    Surgical History: Past Surgical History:  Procedure Laterality Date   ABCESS DRAINAGE  2016   Abdominal abcess due to diverticulitis   caridoverter defibrillator  11/14/2017   ICD   CATARACT EXTRACTION W/PHACO Left 10/26/2015   Procedure: CATARACT EXTRACTION PHACO AND INTRAOCULAR LENS PLACEMENT (IOC) LEFT EYE;  Surgeon: Leandrew Koyanagi, MD;  Location: [facility];  Service: Ophthalmology;  Laterality: Left;   CATARACT EXTRACTION W/PHACO Right 12/07/2015   Procedure: CATARACT EXTRACTION PHACO AND INTRAOCULAR LENS PLACEMENT (IOC);  Surgeon: Leandrew Koyanagi, MD;  Location: [facility];  Service: Ophthalmology;  Laterality: Right;   CHOLECYSTECTOMY     FOOT SURGERY     Per patient, bilateral foot surgery.   PELVIC FRACTURE SURGERY     Per patient for cancer.    Medical History: Past Medical History:  Diagnosis Date   Arthritis    Asthma    Cancer ([place])    Deep vein thrombosis (DVT) ([facility]) 2015   Depression    GERD (gastroesophageal reflux disease)    Hypertension    Weakness of both legs     Family History: Family History  Problem Relation Age of Onset   Depression Sister    Heart disease Sister    Early death Sister    Depression Sister    Depression Sister    Depression Sister    Depression Sister     Social History   Socioeconomic History   Marital status: Married    Spouse name: Not on file   Number of children: Not on file   Years of education: Not on file   Highest education level: Not on file  Occupational History   Not on file  Tobacco Use   Smoking status: Never   Smokeless tobacco: Never  Vaping Use   Vaping Use: Never used  Substance and Sexual Activity   Alcohol use: No   Drug use: No   Sexual activity: Not Currently  Other Topics Concern   Not on file  Social History Narrative   Not on file   Social Determinants of Health   Financial Resource Strain: Low Risk    Difficulty of Paying Living Expenses: Not very hard  Food Insecurity: Not on file  Transportation Needs: Not on file  Physical Activity: Not on file  Stress: Not on file  Social Connections: Not on file  Intimate Partner Violence: Not on file      Review of Systems  Constitutional:  Negative for chills, fatigue and unexpected weight change.  HENT:  Negative for congestion,  rhinorrhea, sneezing and sore throat.   Eyes:  Negative for redness.  Respiratory:  Positive for cough. Negative for chest tightness, shortness of breath and wheezing.   Cardiovascular: Negative.  Negative for chest pain and palpitations.  Gastrointestinal:  Negative for abdominal pain, constipation, diarrhea, nausea and vomiting.  Genitourinary:  Negative for dysuria and frequency.  Musculoskeletal:  Positive for arthralgias. Negative for back pain, joint swelling and neck pain.       Foot pain, feels like gout flare per patient.   Skin:  Negative for rash.  Neurological: Negative.  Negative for tremors and numbness.  Hematological:  Negative for adenopathy. Does not bruise/bleed easily.  Psychiatric/Behavioral:  Negative for behavioral problems (Depression), sleep disturbance and suicidal ideas. The patient is not nervous/anxious.    Vital Signs: Resp 16    Ht 5' (1.524 m)    Wt 215 lb (97.5 kg)    BMI 41.99 kg/m    Observation/Objective: She is alert and oriented and engages in conversation appropriately. She does not appear to be in any acute distress over video call.     Assessment/Plan: 1. Acute idiopathic gout of foot, unspecified laterality Medication prescribed for gout flares.  - colchicine 0.6 MG tablet; Take 1 tablet (0.6 mg total) by mouth daily as needed (gout flare).  Dispense: 60 tablet; Refill: 2   General Counseling: Bera verbalizes understanding of the findings of today's phone visit and agrees with plan of treatment. I have discussed any further diagnostic evaluation that may be needed or ordered today. We also reviewed her medications today. she has been encouraged to call the office with any questions or concerns that should arise related to todays visit.  Return if symptoms worsen or fail to improve.   No orders of the defined types were placed in this encounter.   Meds ordered this encounter  Medications   colchicine 0.6 MG tablet    Sig: Take 1  tablet (0.6 mg total) by mouth daily as needed (gout flare).    Dispense:  60 tablet    Refill:  2    Time spent:10 Minutes Time spent with patient included reviewing progress notes, labs, imaging studies, and discussing plan for follow up.  [place] Controlled Substance Database was reviewed by me for overdose risk score (ORS) if appropriate.  This patient was seen by Jonetta Osgood, FNP-C in collaboration with Dr. Clayborn Bigness as a part of collaborative care agreement.  [name] R. Valetta Fuller, MSN, FNP-C Internal medicine

## 2021-06-19 NOTE — Telephone Encounter (Signed)
[facility] nurse crystal [phone] called that pt had wound on bottom had a blood and order due to [place] stuck and its infection as per alyssa advised need to call her surgeon for that

## 2021-06-20 ENCOUNTER — Other Ambulatory Visit: Payer: Self-pay

## 2021-06-20 ENCOUNTER — Telehealth: Payer: Self-pay

## 2021-06-20 ENCOUNTER — Encounter: Payer: Self-pay | Admitting: Nurse Practitioner

## 2021-06-20 DIAGNOSIS — M10079 Idiopathic gout, unspecified ankle and foot: Secondary | ICD-10-CM

## 2021-06-20 MED ORDER — COLCHICINE 0.6 MG PO TABS: 0.6000 mg | ORAL_TABLET | Freq: Every day | ORAL | 2 refills | Status: DC | PRN

## 2021-06-20 NOTE — Telephone Encounter (Signed)
Called [facility] and canceled colchicine and send to local phar

## 2021-06-26 ENCOUNTER — Telehealth: Payer: Self-pay | Admitting: Student-PharmD

## 2021-06-26 NOTE — Progress Notes (Signed)
° °  Chronic Care Management Pharmacy Assistant   Name: Ellakate Gonsalves  MRN: [id number] DOB: 1946-01-27  Reason for Encounter: PAP    Medications: Outpatient Encounter Medications as of 06/26/2021  Medication Sig Note   albuterol (VENTOLIN HFA) 108 (90 Base) MCG/ACT inhaler Inhale 2 puffs into the lungs every 6 (six) hours as needed for wheezing or shortness of breath.    alendronate (FOSAMAX) 70 MG tablet Take 1 tablet (70 mg total) by mouth every 7 (seven) days. Take with a full glass of water on an empty stomach.    benzonatate (TESSALON) 200 MG capsule Take 1 capsule (200 mg total) by mouth 2 (two) times daily as needed for cough.    cholecalciferol (VITAMIN D) 1000 units tablet Take 1,000 Units by mouth daily.    colchicine 0.6 MG tablet Take 1 tablet (0.6 mg total) by mouth daily as needed (gout flare).    Cyanocobalamin (VITAMIN B-12 PO) Take by mouth. Pt takes [place] gummies    diclofenac Sodium (VOLTAREN) 1 % GEL Apply topically 4 (four) times daily.    diphenoxylate-atropine (LOMOTIL) 2.5-0.025 MG tablet Take 1 tablet by mouth 4 (four) times daily as needed for diarrhea or loose stools.    docusate sodium (COLACE) 100 MG capsule Take 200 mg by mouth daily.  06/07/2015: Received from: [place] (TRULICITY) 1.5 OZ/2.2QM SOPN Inject 1.5 mg into the skin once a week.    DULoxetine (CYMBALTA) 30 MG capsule Take 1 capsule po bid    esomeprazole (NEXIUM) 20 MG capsule Take 20 mg by mouth daily at 12 noon.    fluticasone-salmeterol (ADVAIR) 250-50 MCG/ACT AEPB Inhale 1 puff into the lungs in the morning and at bedtime.    furosemide (LASIX) 20 MG tablet Take 1 tablet (20 mg total) by mouth daily.    gabapentin (NEURONTIN) 300 MG capsule Take 1 cap in morning and 3 cap at bedtime    ipratropium-albuterol (DUONEB) 0.5-2.5 (3) MG/3ML SOLN Take 3 mLs by nebulization every 4 (four) hours as needed.    lisinopril (ZESTRIL) 20 MG tablet Take 1 tablet (20 mg total) by  mouth daily.    Magnesium 250 MG TABS Take by mouth. 06/22/2015: Reports taking once a day   metoprolol succinate (TOPROL-XL) 50 MG 24 hr tablet Take 50 mg by mouth daily. Take with or immediately following a meal.    rosuvastatin (CRESTOR) 10 MG tablet TAKE 1 TABLET TWICE WEEKLY    spironolactone (ALDACTONE) 25 MG tablet 25 mg daily.    No facility-administered encounter medications on file as of 06/26/2021.   New patient assistance application form filled out to [employer] for trulicity. Waiting for patient and provider to complete and sign documentation. Called patient to inquire if they wanted the application mailed to them or if they wanted to come into the office. Patient is required to sign application and to bring/have proof of income. She stated she would be willing to come into office to bring proof of income and sign application once paperwork came in the mail she would like it mailed to their residence address [address]  [place] [address].   DONE: 06/01/21  Charlann Lange, [place]  [phone]

## 2021-06-27 DIAGNOSIS — E039 Hypothyroidism, unspecified: Secondary | ICD-10-CM | POA: Diagnosis not present

## 2021-06-27 DIAGNOSIS — I10 Essential (primary) hypertension: Secondary | ICD-10-CM | POA: Diagnosis not present

## 2021-06-27 DIAGNOSIS — I5022 Chronic systolic (congestive) heart failure: Secondary | ICD-10-CM | POA: Diagnosis not present

## 2021-06-29 DIAGNOSIS — I5020 Unspecified systolic (congestive) heart failure: Secondary | ICD-10-CM | POA: Diagnosis not present

## 2021-06-29 DIAGNOSIS — I10 Essential (primary) hypertension: Secondary | ICD-10-CM | POA: Diagnosis not present

## 2021-06-29 DIAGNOSIS — R0602 Shortness of breath: Secondary | ICD-10-CM | POA: Diagnosis not present

## 2021-06-29 DIAGNOSIS — N189 Chronic kidney disease, unspecified: Secondary | ICD-10-CM | POA: Diagnosis not present

## 2021-06-29 DIAGNOSIS — R0902 Hypoxemia: Secondary | ICD-10-CM | POA: Diagnosis not present

## 2021-06-29 DIAGNOSIS — Z95810 Presence of automatic (implantable) cardiac defibrillator: Secondary | ICD-10-CM | POA: Diagnosis not present

## 2021-06-29 DIAGNOSIS — J449 Chronic obstructive pulmonary disease, unspecified: Secondary | ICD-10-CM | POA: Diagnosis not present

## 2021-06-29 DIAGNOSIS — Z6841 Body Mass Index (BMI) 40.0 and over, adult: Secondary | ICD-10-CM | POA: Diagnosis not present

## 2021-06-29 DIAGNOSIS — R609 Edema, unspecified: Secondary | ICD-10-CM | POA: Diagnosis not present

## 2021-06-29 DIAGNOSIS — I130 Hypertensive heart and chronic kidney disease with heart failure and stage 1 through stage 4 chronic kidney disease, or unspecified chronic kidney disease: Secondary | ICD-10-CM | POA: Diagnosis not present

## 2021-06-29 DIAGNOSIS — M79605 Pain in left leg: Secondary | ICD-10-CM | POA: Diagnosis not present

## 2021-06-29 DIAGNOSIS — I447 Left bundle-branch block, unspecified: Secondary | ICD-10-CM | POA: Diagnosis not present

## 2021-06-29 DIAGNOSIS — M7989 Other specified soft tissue disorders: Secondary | ICD-10-CM | POA: Diagnosis not present

## 2021-06-29 DIAGNOSIS — R42 Dizziness and giddiness: Secondary | ICD-10-CM | POA: Diagnosis not present

## 2021-06-29 DIAGNOSIS — I959 Hypotension, unspecified: Secondary | ICD-10-CM | POA: Diagnosis not present

## 2021-06-30 ENCOUNTER — Telehealth: Payer: Self-pay

## 2021-06-30 DIAGNOSIS — R531 Weakness: Secondary | ICD-10-CM | POA: Diagnosis not present

## 2021-06-30 DIAGNOSIS — M6250 Muscle wasting and atrophy, not elsewhere classified, unspecified site: Secondary | ICD-10-CM | POA: Diagnosis not present

## 2021-06-30 DIAGNOSIS — Z7401 Bed confinement status: Secondary | ICD-10-CM | POA: Diagnosis not present

## 2021-06-30 NOTE — Telephone Encounter (Signed)
Lvm to schedule ED follow up-Toni 

## 2021-07-04 ENCOUNTER — Encounter: Payer: Self-pay | Admitting: Nurse Practitioner

## 2021-07-04 ENCOUNTER — Telehealth (INDEPENDENT_AMBULATORY_CARE_PROVIDER_SITE_OTHER): Payer: Medicare HMO | Admitting: Nurse Practitioner

## 2021-07-04 VITALS — BP 114/70 | HR 79 | Temp 98.7°F | Resp 16 | Ht 60.0 in | Wt 240.0 lb

## 2021-07-04 DIAGNOSIS — M79605 Pain in left leg: Secondary | ICD-10-CM | POA: Diagnosis not present

## 2021-07-04 DIAGNOSIS — M10079 Idiopathic gout, unspecified ankle and foot: Secondary | ICD-10-CM | POA: Diagnosis not present

## 2021-07-04 DIAGNOSIS — E782 Mixed hyperlipidemia: Secondary | ICD-10-CM | POA: Diagnosis not present

## 2021-07-04 DIAGNOSIS — Z4502 Encounter for adjustment and management of automatic implantable cardiac defibrillator: Secondary | ICD-10-CM | POA: Diagnosis not present

## 2021-07-04 DIAGNOSIS — L03116 Cellulitis of left lower limb: Secondary | ICD-10-CM | POA: Diagnosis not present

## 2021-07-04 DIAGNOSIS — F321 Major depressive disorder, single episode, moderate: Secondary | ICD-10-CM | POA: Diagnosis not present

## 2021-07-04 MED ORDER — ROSUVASTATIN CALCIUM 10 MG PO TABS: ORAL_TABLET | ORAL | 3 refills | Status: DC

## 2021-07-04 MED ORDER — VENLAFAXINE HCL ER 37.5 MG PO CP24: 37.5000 mg | ORAL_CAPSULE | Freq: Every day | ORAL | 2 refills | Status: DC

## 2021-07-04 MED ORDER — SULFAMETHOXAZOLE-TRIMETHOPRIM 800-160 MG PO TABS: 1.0000 | ORAL_TABLET | Freq: Two times a day (BID) | ORAL | 0 refills | Status: AC

## 2021-07-04 MED ORDER — OXYCODONE-ACETAMINOPHEN 5-325 MG PO TABS: 1.0000 | ORAL_TABLET | Freq: Four times a day (QID) | ORAL | 0 refills | Status: AC | PRN

## 2021-07-04 NOTE — Progress Notes (Cosign Needed)
[facility] [place], [place] [address]  Internal MEDICINE  Telephone Visit  Patient Name: Diana Bernard  [id number]  [id number]  Date of Service: 07/04/2021  I connected with the patient at 8:30 AM by telephone and verified the patients identity using two identifiers.   I discussed the limitations, risks, security and privacy concerns of performing an evaluation and management service by telephone and the availability of in person appointments. I also discussed with the patient that there may be a patient responsible charge related to the service.  The patient expressed understanding and agrees to proceed.    Chief Complaint  Patient presents with   Acute Visit    Left leg pain   Telephone Assessment    video   Telephone Screen    [phone]   Medication Refill    venlafaxine    HPI [name] presents for a telehealth virtual visit for left leg pain. Patient is unable to come into the office to be seen due to transportation issues/physical mobility issues. Unable to see patient on video due to technical difficulties on the patient's side.  Was seen in ER on Thursday and there was no blood clot. Patient was prescribed lasix from the ER.  Skin is hot to touch, and swollen from knee to ankle. No wounds on legs per patient report. No weeping of fluid per patient report.   Current Medication: Outpatient Encounter Medications as of 07/04/2021  Medication Sig Note   albuterol (VENTOLIN HFA) 108 (90 Base) MCG/ACT inhaler Inhale 2 puffs into the lungs every 6 (six) hours as needed for wheezing or shortness of breath.    alendronate (FOSAMAX) 70 MG tablet Take 1 tablet (70 mg total) by mouth every 7 (seven) days. Take with a full glass of water on an empty stomach.    benzonatate (TESSALON) 200 MG capsule Take 1 capsule (200 mg total) by mouth 2 (two) times daily as needed for cough.    cholecalciferol (VITAMIN D) 1000 units tablet Take 1,000 Units by mouth daily.     colchicine 0.6 MG tablet Take 1 tablet (0.6 mg total) by mouth daily as needed (gout flare).    Cyanocobalamin (VITAMIN B-12 PO) Take by mouth. Pt takes [place] gummies    diclofenac Sodium (VOLTAREN) 1 % GEL Apply topically 4 (four) times daily.    diphenoxylate-atropine (LOMOTIL) 2.5-0.025 MG tablet Take 1 tablet by mouth 4 (four) times daily as needed for diarrhea or loose stools.    docusate sodium (COLACE) 100 MG capsule Take 200 mg by mouth daily.  06/07/2015: Received from: [place] (TRULICITY) 1.5 HQ/7.5FF SOPN Inject 1.5 mg into the skin once a week.    DULoxetine (CYMBALTA) 30 MG capsule Take 1 capsule po bid    esomeprazole (NEXIUM) 20 MG capsule Take 20 mg by mouth daily at 12 noon.    fluticasone-salmeterol (ADVAIR) 250-50 MCG/ACT AEPB Inhale 1 puff into the lungs in the morning and at bedtime.    furosemide (LASIX) 20 MG tablet Take 1 tablet (20 mg total) by mouth daily.    gabapentin (NEURONTIN) 300 MG capsule Take 1 cap in morning and 3 cap at bedtime    ipratropium-albuterol (DUONEB) 0.5-2.5 (3) MG/3ML SOLN Take 3 mLs by nebulization every 4 (four) hours as needed.    lisinopril (ZESTRIL) 20 MG tablet Take 1 tablet (20 mg total) by mouth daily.    Magnesium 250 MG TABS Take by mouth. 06/22/2015: Reports taking once  a day   metoprolol succinate (TOPROL-XL) 50 MG 24 hr tablet Take 50 mg by mouth daily. Take with or immediately following a meal.    spironolactone (ALDACTONE) 25 MG tablet 25 mg daily.    sulfamethoxazole-trimethoprim (BACTRIM DS) 800-160 MG tablet Take 1 tablet by mouth 2 (two) times daily for 10 days.    [DISCONTINUED] rosuvastatin (CRESTOR) 10 MG tablet TAKE 1 TABLET TWICE WEEKLY    allopurinol (ZYLOPRIM) 100 MG tablet Take 100 mg by mouth daily.    oxyCODONE-acetaminophen (PERCOCET/ROXICET) 5-325 MG tablet Take 1 tablet by mouth every 6 (six) hours as needed for up to 5 days for severe pain.    rosuvastatin (CRESTOR) 10 MG tablet TAKE 1  TABLET TWICE WEEKLY    venlafaxine XR (EFFEXOR-XR) 37.5 MG 24 hr capsule Take 1 capsule (37.5 mg total) by mouth daily.    [DISCONTINUED] oxyCODONE-acetaminophen (PERCOCET/ROXICET) 5-325 MG tablet oxycodone-acetaminophen 5 mg-325 mg tablet    [DISCONTINUED] venlafaxine XR (EFFEXOR-XR) 37.5 MG 24 hr capsule Take 37.5 mg by mouth daily.    No facility-administered encounter medications on file as of 07/04/2021.    Surgical History: Past Surgical History:  Procedure Laterality Date   ABCESS DRAINAGE  2016   Abdominal abcess due to diverticulitis   caridoverter defibrillator  11/14/2017   ICD   CATARACT EXTRACTION W/PHACO Left 10/26/2015   Procedure: CATARACT EXTRACTION PHACO AND INTRAOCULAR LENS PLACEMENT (IOC) LEFT EYE;  Surgeon: Leandrew Koyanagi, MD;  Location: [facility];  Service: Ophthalmology;  Laterality: Left;   CATARACT EXTRACTION W/PHACO Right 12/07/2015   Procedure: CATARACT EXTRACTION PHACO AND INTRAOCULAR LENS PLACEMENT (IOC);  Surgeon: Leandrew Koyanagi, MD;  Location: [facility];  Service: Ophthalmology;  Laterality: Right;   CHOLECYSTECTOMY     FOOT SURGERY     Per patient, bilateral foot surgery.   PELVIC FRACTURE SURGERY     Per patient for cancer.    Medical History: Past Medical History:  Diagnosis Date   Arthritis    Asthma    Cancer ([place])    Deep vein thrombosis (DVT) ([facility]) 2015   Depression    GERD (gastroesophageal reflux disease)    Hypertension    Weakness of both legs     Family History: Family History  Problem Relation Age of Onset   Depression Sister    Heart disease Sister    Early death Sister    Depression Sister    Depression Sister    Depression Sister    Depression Sister     Social History   Socioeconomic History   Marital status: Married    Spouse name: Not on file   Number of children: Not on file   Years of education: Not on file   Highest education level: Not on file  Occupational History   Not on file   Tobacco Use   Smoking status: Never   Smokeless tobacco: Never  Vaping Use   Vaping Use: Never used  Substance and Sexual Activity   Alcohol use: No   Drug use: No   Sexual activity: Not Currently  Other Topics Concern   Not on file  Social History Narrative   Not on file   Social Determinants of Health   Financial Resource Strain: Low Risk    Difficulty of Paying Living Expenses: Not very hard  Food Insecurity: Not on file  Transportation Needs: Not on file  Physical Activity: Not on file  Stress: Not on file  Social Connections: Not on file  Intimate  Partner Violence: Not on file      Review of Systems  Respiratory: Negative.  Negative for cough, chest tightness, shortness of breath and wheezing.   Cardiovascular: Negative.  Negative for chest pain and palpitations.  Gastrointestinal: Negative.   Musculoskeletal:  Positive for arthralgias.       Left leg pain, described as sharp and stabbing. Patient reports swelling starting at the knee down to the ankle, denies any redness, rash or wound.   Skin: Negative.  Negative for color change, rash and wound.   Vital Signs: BP 114/70    Pulse 79    Temp 98.7 F (37.1 C)    Resp 16    Ht 5' (1.524 m)    Wt 240 lb (108.9 kg)    SpO2 99%    BMI 46.87 kg/m    Observation/Objective: She is alert and oriented and engages in conversation appropriately. She does not sounds as though she is in any acute distress over the phone. Unable to connect with patient on video call due to technical difficulties on patient's smart phone.     Assessment/Plan: 1. Left leg cellulitis Empiric antibiotic treatment prescribed to treat possible cellulitis of the left leg.  Unable to visually assess the left leg on video or in person.  This diagnosis is based off of review of symptoms with patient and her family member also describing the leg and symptoms. - sulfamethoxazole-trimethoprim (BACTRIM DS) 800-160 MG tablet; Take 1 tablet by mouth 2  (two) times daily for 10 days.  Dispense: 20 tablet; Refill: 0  2. Left leg pain Patient has severe pain in the left leg that is possibly due to cellulitis.  Patient does have a history of DVT in the left leg femoral vein.  When patient was in the ER last Thursday, the providers told her that she did not have a blood clot.  They did prescribe her some Percocet but she did not have any more so a new prescription has been sent to help alleviate pain while she is treating the possible infection - oxyCODONE-acetaminophen (PERCOCET/ROXICET) 5-325 MG tablet; Take 1 tablet by mouth every 6 (six) hours as needed for up to 5 days for severe pain.  Dispense: 20 tablet; Refill: 0  3. Mixed hyperlipidemia Refills ordered - rosuvastatin (CRESTOR) 10 MG tablet; TAKE 1 TABLET TWICE WEEKLY  Dispense: 26 tablet; Refill: 3  4. Acute idiopathic gout of foot, unspecified laterality Patient has gout and is on allopurinol and colchicine. medication not sent but medication list was updated - allopurinol (ZYLOPRIM) 100 MG tablet; Take 100 mg by mouth daily.  5. Moderate major depression ([place]) Patient in need of refills, order sent to pharmacy. - venlafaxine XR (EFFEXOR-XR) 37.5 MG 24 hr capsule; Take 1 capsule (37.5 mg total) by mouth daily.  Dispense: 30 capsule; Refill: 2   General Counseling: Samia verbalizes understanding of the findings of today's phone visit and agrees with plan of treatment. I have discussed any further diagnostic evaluation that may be needed or ordered today. We also reviewed her medications today. she has been encouraged to call the office with any questions or concerns that should arise related to todays visit.  Return if symptoms worsen or fail to improve.   No orders of the defined types were placed in this encounter.   Meds ordered this encounter  Medications   sulfamethoxazole-trimethoprim (BACTRIM DS) 800-160 MG tablet    Sig: Take 1 tablet by mouth 2 (two) times daily for 10  days.  Dispense:  20 tablet    Refill:  0   venlafaxine XR (EFFEXOR-XR) 37.5 MG 24 hr capsule    Sig: Take 1 capsule (37.5 mg total) by mouth daily.    Dispense:  30 capsule    Refill:  2   rosuvastatin (CRESTOR) 10 MG tablet    Sig: TAKE 1 TABLET TWICE WEEKLY    Dispense:  26 tablet    Refill:  3   oxyCODONE-acetaminophen (PERCOCET/ROXICET) 5-325 MG tablet    Sig: Take 1 tablet by mouth every 6 (six) hours as needed for up to 5 days for severe pain.    Dispense:  20 tablet    Refill:  0    Time spent:10 Minutes Time spent with patient included reviewing progress notes, labs, imaging studies, and discussing plan for follow up.  [place] Controlled Substance Database was reviewed by me for overdose risk score (ORS) if appropriate.  This patient was seen by Jonetta Osgood, FNP-C in collaboration with Dr. Clayborn Bigness as a part of collaborative care agreement.  Daiden Coltrane R. Valetta Fuller, MSN, FNP-C Internal medicine

## 2021-07-24 ENCOUNTER — Other Ambulatory Visit: Payer: Self-pay

## 2021-07-24 DIAGNOSIS — M10079 Idiopathic gout, unspecified ankle and foot: Secondary | ICD-10-CM

## 2021-07-24 MED ORDER — ALLOPURINOL 100 MG PO TABS: 100.0000 mg | ORAL_TABLET | Freq: Every day | ORAL | 1 refills | Status: DC

## 2021-08-19 DIAGNOSIS — R0689 Other abnormalities of breathing: Secondary | ICD-10-CM | POA: Diagnosis not present

## 2021-08-19 DIAGNOSIS — Z66 Do not resuscitate: Secondary | ICD-10-CM | POA: Diagnosis not present

## 2021-08-19 DIAGNOSIS — Z6841 Body Mass Index (BMI) 40.0 and over, adult: Secondary | ICD-10-CM | POA: Diagnosis not present

## 2021-08-19 DIAGNOSIS — E440 Moderate protein-calorie malnutrition: Secondary | ICD-10-CM | POA: Diagnosis not present

## 2021-08-19 DIAGNOSIS — I10 Essential (primary) hypertension: Secondary | ICD-10-CM | POA: Diagnosis not present

## 2021-08-19 DIAGNOSIS — Z933 Colostomy status: Secondary | ICD-10-CM | POA: Diagnosis not present

## 2021-08-19 DIAGNOSIS — C541 Malignant neoplasm of endometrium: Secondary | ICD-10-CM | POA: Diagnosis not present

## 2021-08-19 DIAGNOSIS — R6521 Severe sepsis with septic shock: Secondary | ICD-10-CM | POA: Diagnosis not present

## 2021-08-19 DIAGNOSIS — D649 Anemia, unspecified: Secondary | ICD-10-CM | POA: Diagnosis not present

## 2021-08-19 DIAGNOSIS — Z7401 Bed confinement status: Secondary | ICD-10-CM | POA: Diagnosis not present

## 2021-08-19 DIAGNOSIS — I890 Lymphedema, not elsewhere classified: Secondary | ICD-10-CM | POA: Diagnosis not present

## 2021-08-19 DIAGNOSIS — R110 Nausea: Secondary | ICD-10-CM | POA: Diagnosis not present

## 2021-08-19 DIAGNOSIS — L539 Erythematous condition, unspecified: Secondary | ICD-10-CM | POA: Diagnosis not present

## 2021-08-19 DIAGNOSIS — L0390 Cellulitis, unspecified: Secondary | ICD-10-CM | POA: Diagnosis not present

## 2021-08-19 DIAGNOSIS — I5022 Chronic systolic (congestive) heart failure: Secondary | ICD-10-CM | POA: Diagnosis not present

## 2021-08-19 DIAGNOSIS — I5020 Unspecified systolic (congestive) heart failure: Secondary | ICD-10-CM | POA: Diagnosis not present

## 2021-08-19 DIAGNOSIS — R42 Dizziness and giddiness: Secondary | ICD-10-CM | POA: Diagnosis not present

## 2021-08-19 DIAGNOSIS — A401 Sepsis due to streptococcus, group B: Secondary | ICD-10-CM | POA: Diagnosis not present

## 2021-08-19 DIAGNOSIS — A419 Sepsis, unspecified organism: Secondary | ICD-10-CM | POA: Diagnosis not present

## 2021-08-19 DIAGNOSIS — R509 Fever, unspecified: Secondary | ICD-10-CM | POA: Diagnosis not present

## 2021-08-19 DIAGNOSIS — Z20822 Contact with and (suspected) exposure to covid-19: Secondary | ICD-10-CM | POA: Diagnosis not present

## 2021-08-19 DIAGNOSIS — B9689 Other specified bacterial agents as the cause of diseases classified elsewhere: Secondary | ICD-10-CM | POA: Diagnosis not present

## 2021-08-19 DIAGNOSIS — I130 Hypertensive heart and chronic kidney disease with heart failure and stage 1 through stage 4 chronic kidney disease, or unspecified chronic kidney disease: Secondary | ICD-10-CM | POA: Diagnosis not present

## 2021-08-19 DIAGNOSIS — B951 Streptococcus, group B, as the cause of diseases classified elsewhere: Secondary | ICD-10-CM | POA: Diagnosis not present

## 2021-08-19 DIAGNOSIS — R21 Rash and other nonspecific skin eruption: Secondary | ICD-10-CM | POA: Diagnosis not present

## 2021-08-19 DIAGNOSIS — N179 Acute kidney failure, unspecified: Secondary | ICD-10-CM | POA: Diagnosis not present

## 2021-08-19 DIAGNOSIS — R7881 Bacteremia: Secondary | ICD-10-CM | POA: Diagnosis not present

## 2021-08-19 DIAGNOSIS — G620 Drug-induced polyneuropathy: Secondary | ICD-10-CM | POA: Diagnosis not present

## 2021-08-19 DIAGNOSIS — Z86718 Personal history of other venous thrombosis and embolism: Secondary | ICD-10-CM | POA: Diagnosis not present

## 2021-08-19 DIAGNOSIS — I517 Cardiomegaly: Secondary | ICD-10-CM | POA: Diagnosis not present

## 2021-08-19 DIAGNOSIS — S31000A Unspecified open wound of lower back and pelvis without penetration into retroperitoneum, initial encounter: Secondary | ICD-10-CM | POA: Diagnosis not present

## 2021-08-19 DIAGNOSIS — Z8679 Personal history of other diseases of the circulatory system: Secondary | ICD-10-CM | POA: Diagnosis not present

## 2021-08-19 DIAGNOSIS — I959 Hypotension, unspecified: Secondary | ICD-10-CM | POA: Diagnosis not present

## 2021-08-19 DIAGNOSIS — R918 Other nonspecific abnormal finding of lung field: Secondary | ICD-10-CM | POA: Diagnosis not present

## 2021-08-19 DIAGNOSIS — M6281 Muscle weakness (generalized): Secondary | ICD-10-CM | POA: Diagnosis not present

## 2021-08-19 DIAGNOSIS — E876 Hypokalemia: Secondary | ICD-10-CM | POA: Diagnosis not present

## 2021-08-19 DIAGNOSIS — R059 Cough, unspecified: Secondary | ICD-10-CM | POA: Diagnosis not present

## 2021-08-19 DIAGNOSIS — D696 Thrombocytopenia, unspecified: Secondary | ICD-10-CM | POA: Diagnosis not present

## 2021-08-19 DIAGNOSIS — C20 Malignant neoplasm of rectum: Secondary | ICD-10-CM | POA: Diagnosis not present

## 2021-08-19 DIAGNOSIS — N390 Urinary tract infection, site not specified: Secondary | ICD-10-CM | POA: Diagnosis not present

## 2021-08-19 DIAGNOSIS — I447 Left bundle-branch block, unspecified: Secondary | ICD-10-CM | POA: Diagnosis not present

## 2021-08-19 DIAGNOSIS — J449 Chronic obstructive pulmonary disease, unspecified: Secondary | ICD-10-CM | POA: Diagnosis not present

## 2021-08-19 DIAGNOSIS — E7800 Pure hypercholesterolemia, unspecified: Secondary | ICD-10-CM | POA: Diagnosis not present

## 2021-08-19 DIAGNOSIS — Z8619 Personal history of other infectious and parasitic diseases: Secondary | ICD-10-CM | POA: Diagnosis not present

## 2021-08-28 DIAGNOSIS — S31000A Unspecified open wound of lower back and pelvis without penetration into retroperitoneum, initial encounter: Secondary | ICD-10-CM | POA: Diagnosis not present

## 2021-08-28 DIAGNOSIS — D649 Anemia, unspecified: Secondary | ICD-10-CM | POA: Diagnosis not present

## 2021-08-28 DIAGNOSIS — B951 Streptococcus, group B, as the cause of diseases classified elsewhere: Secondary | ICD-10-CM | POA: Diagnosis not present

## 2021-08-28 DIAGNOSIS — I130 Hypertensive heart and chronic kidney disease with heart failure and stage 1 through stage 4 chronic kidney disease, or unspecified chronic kidney disease: Secondary | ICD-10-CM | POA: Diagnosis not present

## 2021-08-28 DIAGNOSIS — R2689 Other abnormalities of gait and mobility: Secondary | ICD-10-CM | POA: Diagnosis not present

## 2021-08-28 DIAGNOSIS — N179 Acute kidney failure, unspecified: Secondary | ICD-10-CM | POA: Diagnosis not present

## 2021-08-28 DIAGNOSIS — I10 Essential (primary) hypertension: Secondary | ICD-10-CM | POA: Diagnosis not present

## 2021-08-28 DIAGNOSIS — C541 Malignant neoplasm of endometrium: Secondary | ICD-10-CM | POA: Diagnosis not present

## 2021-08-28 DIAGNOSIS — M6281 Muscle weakness (generalized): Secondary | ICD-10-CM | POA: Diagnosis not present

## 2021-08-28 DIAGNOSIS — G629 Polyneuropathy, unspecified: Secondary | ICD-10-CM | POA: Diagnosis not present

## 2021-08-28 DIAGNOSIS — R7881 Bacteremia: Secondary | ICD-10-CM | POA: Diagnosis not present

## 2021-08-28 DIAGNOSIS — Z7401 Bed confinement status: Secondary | ICD-10-CM | POA: Diagnosis not present

## 2021-08-28 DIAGNOSIS — R21 Rash and other nonspecific skin eruption: Secondary | ICD-10-CM | POA: Diagnosis not present

## 2021-08-28 DIAGNOSIS — F331 Major depressive disorder, recurrent, moderate: Secondary | ICD-10-CM | POA: Diagnosis not present

## 2021-08-28 DIAGNOSIS — E876 Hypokalemia: Secondary | ICD-10-CM | POA: Diagnosis not present

## 2021-08-28 DIAGNOSIS — C20 Malignant neoplasm of rectum: Secondary | ICD-10-CM | POA: Diagnosis not present

## 2021-08-28 DIAGNOSIS — D696 Thrombocytopenia, unspecified: Secondary | ICD-10-CM | POA: Diagnosis not present

## 2021-08-28 DIAGNOSIS — R0609 Other forms of dyspnea: Secondary | ICD-10-CM | POA: Diagnosis not present

## 2021-08-28 DIAGNOSIS — Z8619 Personal history of other infectious and parasitic diseases: Secondary | ICD-10-CM | POA: Diagnosis not present

## 2021-08-28 DIAGNOSIS — R790 Abnormal level of blood mineral: Secondary | ICD-10-CM | POA: Diagnosis not present

## 2021-08-28 DIAGNOSIS — I5020 Unspecified systolic (congestive) heart failure: Secondary | ICD-10-CM | POA: Diagnosis not present

## 2021-08-28 DIAGNOSIS — Z933 Colostomy status: Secondary | ICD-10-CM | POA: Diagnosis not present

## 2021-08-28 DIAGNOSIS — I447 Left bundle-branch block, unspecified: Secondary | ICD-10-CM | POA: Diagnosis not present

## 2021-08-28 DIAGNOSIS — G620 Drug-induced polyneuropathy: Secondary | ICD-10-CM | POA: Diagnosis not present

## 2021-08-28 DIAGNOSIS — E7800 Pure hypercholesterolemia, unspecified: Secondary | ICD-10-CM | POA: Diagnosis not present

## 2021-08-28 DIAGNOSIS — J449 Chronic obstructive pulmonary disease, unspecified: Secondary | ICD-10-CM | POA: Diagnosis not present

## 2021-08-28 DIAGNOSIS — F339 Major depressive disorder, recurrent, unspecified: Secondary | ICD-10-CM | POA: Diagnosis not present

## 2021-08-28 DIAGNOSIS — Z5111 Encounter for antineoplastic chemotherapy: Secondary | ICD-10-CM | POA: Diagnosis not present

## 2021-08-28 DIAGNOSIS — J302 Other seasonal allergic rhinitis: Secondary | ICD-10-CM | POA: Diagnosis not present

## 2021-09-04 DIAGNOSIS — D649 Anemia, unspecified: Secondary | ICD-10-CM | POA: Diagnosis not present

## 2021-09-07 DIAGNOSIS — J302 Other seasonal allergic rhinitis: Secondary | ICD-10-CM | POA: Diagnosis not present

## 2021-09-20 DIAGNOSIS — B951 Streptococcus, group B, as the cause of diseases classified elsewhere: Secondary | ICD-10-CM | POA: Diagnosis not present

## 2021-09-20 DIAGNOSIS — C541 Malignant neoplasm of endometrium: Secondary | ICD-10-CM | POA: Diagnosis not present

## 2021-09-20 DIAGNOSIS — Z933 Colostomy status: Secondary | ICD-10-CM | POA: Diagnosis not present

## 2021-09-20 DIAGNOSIS — Z8619 Personal history of other infectious and parasitic diseases: Secondary | ICD-10-CM | POA: Diagnosis not present

## 2021-09-20 DIAGNOSIS — I10 Essential (primary) hypertension: Secondary | ICD-10-CM | POA: Diagnosis not present

## 2021-09-20 DIAGNOSIS — I5020 Unspecified systolic (congestive) heart failure: Secondary | ICD-10-CM | POA: Diagnosis not present

## 2021-09-20 DIAGNOSIS — R7881 Bacteremia: Secondary | ICD-10-CM | POA: Diagnosis not present

## 2021-09-20 DIAGNOSIS — M6281 Muscle weakness (generalized): Secondary | ICD-10-CM | POA: Diagnosis not present

## 2021-09-20 DIAGNOSIS — G620 Drug-induced polyneuropathy: Secondary | ICD-10-CM | POA: Diagnosis not present

## 2021-09-21 DIAGNOSIS — R2689 Other abnormalities of gait and mobility: Secondary | ICD-10-CM | POA: Diagnosis not present

## 2021-09-21 DIAGNOSIS — R0609 Other forms of dyspnea: Secondary | ICD-10-CM | POA: Diagnosis not present

## 2021-09-21 DIAGNOSIS — R790 Abnormal level of blood mineral: Secondary | ICD-10-CM | POA: Diagnosis not present

## 2021-09-21 DIAGNOSIS — G629 Polyneuropathy, unspecified: Secondary | ICD-10-CM | POA: Diagnosis not present

## 2021-09-21 DIAGNOSIS — Z5111 Encounter for antineoplastic chemotherapy: Secondary | ICD-10-CM | POA: Diagnosis not present

## 2021-09-21 DIAGNOSIS — C541 Malignant neoplasm of endometrium: Secondary | ICD-10-CM | POA: Diagnosis not present

## 2021-09-26 ENCOUNTER — Telehealth: Payer: Self-pay

## 2021-09-26 ENCOUNTER — Other Ambulatory Visit: Payer: Self-pay

## 2021-09-26 MED ORDER — EMPAGLIFLOZIN 10 MG PO TABS: 10.0000 mg | ORAL_TABLET | Freq: Every day | ORAL | 0 refills | Status: DC

## 2021-09-26 NOTE — Telephone Encounter (Signed)
Nurse(tonya) from [facility] [phone] called that they took her of from trulicity in hospital and put her on jardiance 10 mg as per alyssa send pres for jardiance and also Gave verbal order for nursing once a week for 3 week and once a week every 2 weeks  for 6 week and 2 prn .

## 2021-09-29 ENCOUNTER — Other Ambulatory Visit: Payer: Self-pay

## 2021-09-29 DIAGNOSIS — F321 Major depressive disorder, single episode, moderate: Secondary | ICD-10-CM

## 2021-09-29 DIAGNOSIS — M10079 Idiopathic gout, unspecified ankle and foot: Secondary | ICD-10-CM

## 2021-09-29 MED ORDER — VENLAFAXINE HCL ER 37.5 MG PO CP24: 37.5000 mg | ORAL_CAPSULE | Freq: Every day | ORAL | 2 refills | Status: DC

## 2021-09-29 MED ORDER — COLCHICINE 0.6 MG PO TABS: 0.6000 mg | ORAL_TABLET | Freq: Every day | ORAL | 2 refills | Status: DC | PRN

## 2021-09-29 MED ORDER — ALLOPURINOL 100 MG PO TABS: 100.0000 mg | ORAL_TABLET | Freq: Every day | ORAL | 1 refills | Status: DC

## 2021-10-02 ENCOUNTER — Encounter: Payer: Self-pay | Admitting: Nurse Practitioner

## 2021-10-02 ENCOUNTER — Ambulatory Visit (INDEPENDENT_AMBULATORY_CARE_PROVIDER_SITE_OTHER): Payer: Medicare HMO | Admitting: Nurse Practitioner

## 2021-10-02 VITALS — BP 140/64 | HR 70 | Temp 98.3°F | Resp 16 | Ht 60.0 in | Wt 245.0 lb

## 2021-10-02 DIAGNOSIS — E782 Mixed hyperlipidemia: Secondary | ICD-10-CM | POA: Diagnosis not present

## 2021-10-02 DIAGNOSIS — E538 Deficiency of other specified B group vitamins: Secondary | ICD-10-CM

## 2021-10-02 DIAGNOSIS — E559 Vitamin D deficiency, unspecified: Secondary | ICD-10-CM

## 2021-10-02 DIAGNOSIS — R7301 Impaired fasting glucose: Secondary | ICD-10-CM | POA: Diagnosis not present

## 2021-10-02 DIAGNOSIS — I5022 Chronic systolic (congestive) heart failure: Secondary | ICD-10-CM | POA: Diagnosis not present

## 2021-10-02 DIAGNOSIS — F321 Major depressive disorder, single episode, moderate: Secondary | ICD-10-CM

## 2021-10-02 DIAGNOSIS — N390 Urinary tract infection, site not specified: Secondary | ICD-10-CM

## 2021-10-02 DIAGNOSIS — B3731 Acute candidiasis of vulva and vagina: Secondary | ICD-10-CM | POA: Diagnosis not present

## 2021-10-02 DIAGNOSIS — I10 Essential (primary) hypertension: Secondary | ICD-10-CM | POA: Diagnosis not present

## 2021-10-02 MED ORDER — METOPROLOL SUCCINATE ER 50 MG PO TB24: 50.0000 mg | ORAL_TABLET | Freq: Every day | ORAL | 3 refills | Status: DC

## 2021-10-02 MED ORDER — NITROFURANTOIN MONOHYD MACRO 100 MG PO CAPS: 100.0000 mg | ORAL_CAPSULE | ORAL | 2 refills | Status: DC

## 2021-10-02 MED ORDER — NITROFURANTOIN MONOHYD MACRO 100 MG PO CAPS: 100.0000 mg | ORAL_CAPSULE | Freq: Two times a day (BID) | ORAL | 0 refills | Status: AC

## 2021-10-02 MED ORDER — VENLAFAXINE HCL ER 37.5 MG PO CP24: 37.5000 mg | ORAL_CAPSULE | Freq: Every day | ORAL | 3 refills | Status: DC

## 2021-10-02 MED ORDER — TRULICITY 1.5 MG/0.5ML ~~LOC~~ SOAJ: 1.5000 mg | SUBCUTANEOUS | 3 refills | Status: DC

## 2021-10-02 MED ORDER — LISINOPRIL 20 MG PO TABS: 20.0000 mg | ORAL_TABLET | Freq: Every day | ORAL | 3 refills | Status: DC

## 2021-10-02 MED ORDER — FLUCONAZOLE 150 MG PO TABS: 150.0000 mg | ORAL_TABLET | Freq: Once | ORAL | 0 refills | Status: AC

## 2021-10-02 MED ORDER — FUROSEMIDE 20 MG PO TABS: 20.0000 mg | ORAL_TABLET | Freq: Every day | ORAL | 3 refills | Status: DC

## 2021-10-02 NOTE — Progress Notes (Signed)
[facility] [place], [place] [address]  Internal MEDICINE  Office Visit Note  Patient Name: Diana Bernard  [id number]  [id number]  Date of Service: 10/02/2021  Chief Complaint  Patient presents with   Follow-up    UTI, blood infection, skin infection, was septic beginning of may   Urinary Tract Infection    Today pt has burning when urinating    HPI Diana Bernard presents for a follow up visit for UTI. She was in the hospital for a UTI and sepsis in may.  She is having UTI symptoms again --burning with urination.  Also her trulicity was stopped while she was in the hospital. She was put on jardiance which is more expensive and she has been gaining weight. Her glucose levels have also been elevated.  She is due for repeat labs.  She used to be on a prophylactic for UTIs. Some of her symptoms are also overlapping with symptoms of a yeast infection.      Current Medication: Outpatient Encounter Medications as of 10/02/2021  Medication Sig Note   albuterol (VENTOLIN HFA) 108 (90 Base) MCG/ACT inhaler Inhale 2 puffs into the lungs every 6 (six) hours as needed for wheezing or shortness of breath.    alendronate (FOSAMAX) 70 MG tablet Take 1 tablet (70 mg total) by mouth every 7 (seven) days. Take with a full glass of water on an empty stomach.    allopurinol (ZYLOPRIM) 100 MG tablet Take 1 tablet (100 mg total) by mouth daily.    cholecalciferol (VITAMIN D) 1000 units tablet Take 1,000 Units by mouth daily.    colchicine 0.6 MG tablet Take 1 tablet (0.6 mg total) by mouth daily as needed (gout flare).    Cyanocobalamin (VITAMIN B-12 PO) Take by mouth. Pt takes [place] gummies    diclofenac Sodium (VOLTAREN) 1 % GEL Apply topically 4 (four) times daily.    esomeprazole (NEXIUM) 20 MG capsule Take 20 mg by mouth daily at 12 noon.    [EXPIRED] fluconazole (DIFLUCAN) 150 MG tablet Take 1 tablet (150 mg total) by mouth once for 1 dose. May take an additional  dose after 3 days if still symptomatic.    fluticasone-salmeterol (ADVAIR) 250-50 MCG/ACT AEPB Inhale 1 puff into the lungs in the morning and at bedtime.    ipratropium-albuterol (DUONEB) 0.5-2.5 (3) MG/3ML SOLN Take 3 mLs by nebulization every 4 (four) hours as needed.    [EXPIRED] nitrofurantoin, macrocrystal-monohydrate, (MACROBID) 100 MG capsule Take 1 capsule (100 mg total) by mouth 2 (two) times daily for 7 days. Take with food    nitrofurantoin, macrocrystal-monohydrate, (MACROBID) 100 MG capsule Take 1 capsule (100 mg total) by mouth every Monday, Wednesday, and Friday.    rosuvastatin (CRESTOR) 10 MG tablet TAKE 1 TABLET TWICE WEEKLY    [DISCONTINUED] empagliflozin (JARDIANCE) 10 MG TABS tablet Take 1 tablet (10 mg total) by mouth daily.    [DISCONTINUED] furosemide (LASIX) 20 MG tablet Take 1 tablet (20 mg total) by mouth daily.    [DISCONTINUED] gabapentin (NEURONTIN) 300 MG capsule Take 1 cap in morning and 3 cap at bedtime (Patient taking differently: Take 1 cap in morning 1 at noon and 3 cap at bedtime)    [DISCONTINUED] lisinopril (ZESTRIL) 20 MG tablet Take 1 tablet (20 mg total) by mouth daily.    [DISCONTINUED] Magnesium 250 MG TABS Take by mouth. 06/22/2015: Reports taking once a day   [DISCONTINUED] metoprolol succinate (TOPROL-XL) 50 MG 24 hr tablet Take 50 mg  by mouth daily. Take with or immediately following a meal.    [DISCONTINUED] venlafaxine XR (EFFEXOR-XR) 37.5 MG 24 hr capsule Take 1 capsule (37.5 mg total) by mouth daily.    furosemide (LASIX) 20 MG tablet Take 1 tablet (20 mg total) by mouth daily.    lisinopril (ZESTRIL) 20 MG tablet Take 1 tablet (20 mg total) by mouth daily.    metoprolol succinate (TOPROL-XL) 50 MG 24 hr tablet Take 1 tablet (50 mg total) by mouth daily. Take with or immediately following a meal.    [DISCONTINUED] benzonatate (TESSALON) 200 MG capsule Take 1 capsule (200 mg total) by mouth 2 (two) times daily as needed for cough. (Patient not  taking: Reported on 10/02/2021)    [DISCONTINUED] diphenoxylate-atropine (LOMOTIL) 2.5-0.025 MG tablet Take 1 tablet by mouth 4 (four) times daily as needed for diarrhea or loose stools. (Patient not taking: Reported on 10/02/2021)    [DISCONTINUED] docusate sodium (COLACE) 100 MG capsule Take 200 mg by mouth daily.  (Patient not taking: Reported on 10/02/2021) 06/07/2015: Received from: [facility]   [DISCONTINUED] Dulaglutide (TRULICITY) 1.5 UX/3.2GM SOPN Inject 1.5 mg into the skin once a week. (Patient not taking: Reported on 09/26/2021)    [DISCONTINUED] Dulaglutide (TRULICITY) 1.5 WN/0.2VO SOPN Inject 1.5 mg into the skin once a week. (Patient not taking: Reported on 11/06/2021)    [DISCONTINUED] DULoxetine (CYMBALTA) 30 MG capsule Take 1 capsule po bid (Patient not taking: Reported on 10/02/2021)    [DISCONTINUED] spironolactone (ALDACTONE) 25 MG tablet 25 mg daily. (Patient not taking: Reported on 10/02/2021)    [DISCONTINUED] venlafaxine XR (EFFEXOR-XR) 37.5 MG 24 hr capsule Take 1 capsule (37.5 mg total) by mouth daily.    No facility-administered encounter medications on file as of 10/02/2021.    Surgical History: Past Surgical History:  Procedure Laterality Date   ABCESS DRAINAGE  2016   Abdominal abcess due to diverticulitis   caridoverter defibrillator  11/14/2017   ICD   CATARACT EXTRACTION W/PHACO Left 10/26/2015   Procedure: CATARACT EXTRACTION PHACO AND INTRAOCULAR LENS PLACEMENT (IOC) LEFT EYE;  Surgeon: Leandrew Koyanagi, MD;  Location: [place];  Service: Ophthalmology;  Laterality: Left;   CATARACT EXTRACTION W/PHACO Right 12/07/2015   Procedure: CATARACT EXTRACTION PHACO AND INTRAOCULAR LENS PLACEMENT (IOC);  Surgeon: Leandrew Koyanagi, MD;  Location: [place];  Service: Ophthalmology;  Laterality: Right;   CHOLECYSTECTOMY     FOOT SURGERY     Per patient, bilateral foot surgery.   OSTOMY  01/25/2021   OSTOMY  02/11/2021   PELVIC FRACTURE SURGERY      Per patient for cancer.    Medical History: Past Medical History:  Diagnosis Date   Arthritis    Asthma    Cancer ([place])    Deep vein thrombosis (DVT) ([place]) 2015   Depression    GERD (gastroesophageal reflux disease)    Hypertension    Weakness of both legs     Family History: Family History  Problem Relation Age of Onset   Depression Sister    Heart disease Sister    Early death Sister    Depression Sister    Depression Sister    Depression Sister    Depression Sister     Social History   Socioeconomic History   Marital status: Married    Spouse name: Not on file   Number of children: Not on file   Years of education: Not on file   Highest education level: Not on file  Occupational  History   Not on file  Tobacco Use   Smoking status: Never   Smokeless tobacco: Never  Vaping Use   Vaping Use: Never used  Substance and Sexual Activity   Alcohol use: No   Drug use: No   Sexual activity: Not Currently  Other Topics Concern   Not on file  Social History Narrative   Not on file   Social Determinants of Health   Financial Resource Strain: Low Risk  (08/03/2020)   Overall Financial Resource Strain (CARDIA)    Difficulty of Paying Living Expenses: Not very hard  Food Insecurity: Not on file  Transportation Needs: Not on file  Physical Activity: Not on file  Stress: Not on file  Social Connections: Not on file  Intimate Partner Violence: Not on file      Review of Systems  Constitutional:  Positive for fatigue. Negative for appetite change, chills and fever.  Respiratory: Negative.  Negative for cough, chest tightness, shortness of breath and wheezing.   Cardiovascular: Negative.  Negative for chest pain and palpitations.  Gastrointestinal:  Negative for abdominal pain, constipation, diarrhea, nausea and vomiting.  Genitourinary:  Positive for dysuria, flank pain, frequency and urgency.  Musculoskeletal:  Positive for arthralgias, back pain and gait  problem (Working with physical therapy and continuing to improve).    Vital Signs: BP 140/64   Pulse 70   Temp 98.3 F (36.8 C)   Resp 16   Ht 5' (1.524 m)   Wt 245 lb (111.1 kg)   SpO2 98%   BMI 47.85 kg/m    Physical Exam Vitals reviewed.  Constitutional:      General: She is not in acute distress.    Appearance: Normal appearance. She is obese. She is not ill-appearing.  HENT:     Head: Normocephalic and atraumatic.  Eyes:     Pupils: Pupils are equal, round, and reactive to light.  Cardiovascular:     Rate and Rhythm: Normal rate and regular rhythm.  Pulmonary:     Effort: Pulmonary effort is normal. No respiratory distress.  Abdominal:     Palpations: There is no mass.     Tenderness: There is abdominal tenderness in the suprapubic area. There is guarding.  Neurological:     Mental Status: She is alert and oriented to person, place, and time.  Psychiatric:        Mood and Affect: Mood normal.        Behavior: Behavior normal.        Assessment/Plan: 1. Essential hypertension Blood pressure is stable with current medications, refills ordered.,  Routine labs ordered. - Basic Metabolic Panel (BMET) - CBC with Differential/Platelet - B12 and Folate Panel - Vitamin D (25 hydroxy) - Lipid Profile - metoprolol succinate (TOPROL-XL) 50 MG 24 hr tablet; Take 1 tablet (50 mg total) by mouth daily. Take with or immediately following a meal.  Dispense: 90 tablet; Refill: 3 - lisinopril (ZESTRIL) 20 MG tablet; Take 1 tablet (20 mg total) by mouth daily.  Dispense: 90 tablet; Refill: 3 - furosemide (LASIX) 20 MG tablet; Take 1 tablet (20 mg total) by mouth daily.  Dispense: 90 tablet; Refill: 3  2. Chronic systolic heart failure ([place]) Routine labs ordered, blood pressure is stable with current medications, refills ordered. - Basic Metabolic Panel (BMET) - CBC with Differential/Platelet - B12 and Folate Panel - Vitamin D (25 hydroxy) - Lipid Profile - metoprolol  succinate (TOPROL-XL) 50 MG 24 hr tablet; Take 1 tablet (50 mg  total) by mouth daily. Take with or immediately following a meal.  Dispense: 90 tablet; Refill: 3 - lisinopril (ZESTRIL) 20 MG tablet; Take 1 tablet (20 mg total) by mouth daily.  Dispense: 90 tablet; Refill: 3 - furosemide (LASIX) 20 MG tablet; Take 1 tablet (20 mg total) by mouth daily.  Dispense: 90 tablet; Refill: 3  3. Hypomagnesemia Repeat magnesium level to ensure resolution of low magnesium - Magnesium  4. Urinary tract infection without hematuria, site unspecified A 7-day course of nitrofurantoin was prescribed and then patient will continue taking nitrofurantoin as a prophylaxis for UTI every Monday Wednesday and Friday. - nitrofurantoin, macrocrystal-monohydrate, (MACROBID) 100 MG capsule; Take 1 capsule (100 mg total) by mouth 2 (two) times daily for 7 days. Take with food  Dispense: 14 capsule; Refill: 0 - nitrofurantoin, macrocrystal-monohydrate, (MACROBID) 100 MG capsule; Take 1 capsule (100 mg total) by mouth every Monday, Wednesday, and Friday.  Dispense: 36 capsule; Refill: 2  5. Impaired fasting glucose Routine labs ordered, patient's Trulicity was restarted, Jardiance was discontinued - Basic Metabolic Panel (BMET) - CBC with Differential/Platelet - B12 and Folate Panel - Vitamin D (25 hydroxy) - Lipid Profile  6. Mixed hyperlipidemia Routine labs ordered - Lipid Profile  7. Vitamin D deficiency Routine labs ordered - Vitamin D (25 hydroxy)  8. B12 deficiency Routine labs ordered - B12 and Folate Panel  9. Vulvovaginal candidiasis Fluconazole ordered as prophylaxis for yeast infections while patient is taking antibiotic. - fluconazole (DIFLUCAN) 150 MG tablet; Take 1 tablet (150 mg total) by mouth once for 1 dose. May take an additional dose after 3 days if still symptomatic.  Dispense: 3 tablet; Refill: 0  10. Moderate major depression (HCC) Venlafaxine refills ordered.   General Counseling:  Diana Bernard verbalizes understanding of the findings of todays visit and agrees with plan of treatment. I have discussed any further diagnostic evaluation that may be needed or ordered today. We also reviewed her medications today. she has been encouraged to call the office with any questions or concerns that should arise related to todays visit.    Orders Placed This Encounter  Procedures   Magnesium   Basic Metabolic Panel (BMET)   CBC with Differential/Platelet   B12 and Folate Panel   Vitamin D (25 hydroxy)   Lipid Profile    Meds ordered this encounter  Medications   nitrofurantoin, macrocrystal-monohydrate, (MACROBID) 100 MG capsule    Sig: Take 1 capsule (100 mg total) by mouth 2 (two) times daily for 7 days. Take with food    Dispense:  14 capsule    Refill:  0   fluconazole (DIFLUCAN) 150 MG tablet    Sig: Take 1 tablet (150 mg total) by mouth once for 1 dose. May take an additional dose after 3 days if still symptomatic.    Dispense:  3 tablet    Refill:  0   nitrofurantoin, macrocrystal-monohydrate, (MACROBID) 100 MG capsule    Sig: Take 1 capsule (100 mg total) by mouth every Monday, Wednesday, and Friday.    Dispense:  36 capsule    Refill:  2   DISCONTD: Dulaglutide (TRULICITY) 1.5 VZ/8.5YI SOPN    Sig: Inject 1.5 mg into the skin once a week.    Dispense:  6 mL    Refill:  3   metoprolol succinate (TOPROL-XL) 50 MG 24 hr tablet    Sig: Take 1 tablet (50 mg total) by mouth daily. Take with or immediately following a meal.    Dispense:  90 tablet    Refill:  3   lisinopril (ZESTRIL) 20 MG tablet    Sig: Take 1 tablet (20 mg total) by mouth daily.    Dispense:  90 tablet    Refill:  3   furosemide (LASIX) 20 MG tablet    Sig: Take 1 tablet (20 mg total) by mouth daily.    Dispense:  90 tablet    Refill:  3   DISCONTD: venlafaxine XR (EFFEXOR-XR) 37.5 MG 24 hr capsule    Sig: Take 1 capsule (37.5 mg total) by mouth daily.    Dispense:  90 capsule    Refill:  3     Return for previously scheduled, CPE, Chelise Hanger PCP in july.   Total time spent:30 Minutes Time spent includes review of chart, medications, test results, and follow up plan with the patient.   [place] Controlled Substance Database was reviewed by me.  This patient was seen by Jonetta Osgood, FNP-C in collaboration with Dr. Clayborn Bigness as a part of collaborative care agreement.   Johnnisha Forton R. Valetta Fuller, MSN, FNP-C Internal medicine

## 2021-10-03 ENCOUNTER — Telehealth: Payer: Self-pay

## 2021-10-03 NOTE — Telephone Encounter (Signed)
Diana Bernard [phone]) PT from [place] called requesting verbal orders for PT  1 x a week for 1 week 2 X a week for 7 weeks 1 x a week for 1 week LMOM that I called back and l gave her the verbal ok to to PT orders

## 2021-10-04 DIAGNOSIS — Z4502 Encounter for adjustment and management of automatic implantable cardiac defibrillator: Secondary | ICD-10-CM | POA: Diagnosis not present

## 2021-10-06 ENCOUNTER — Other Ambulatory Visit: Payer: Self-pay

## 2021-10-06 DIAGNOSIS — E782 Mixed hyperlipidemia: Secondary | ICD-10-CM | POA: Diagnosis not present

## 2021-10-06 DIAGNOSIS — E559 Vitamin D deficiency, unspecified: Secondary | ICD-10-CM | POA: Diagnosis not present

## 2021-10-06 DIAGNOSIS — I10 Essential (primary) hypertension: Secondary | ICD-10-CM | POA: Diagnosis not present

## 2021-10-06 DIAGNOSIS — I5022 Chronic systolic (congestive) heart failure: Secondary | ICD-10-CM | POA: Diagnosis not present

## 2021-10-06 DIAGNOSIS — E538 Deficiency of other specified B group vitamins: Secondary | ICD-10-CM | POA: Diagnosis not present

## 2021-10-06 DIAGNOSIS — D510 Vitamin B12 deficiency anemia due to intrinsic factor deficiency: Secondary | ICD-10-CM | POA: Diagnosis not present

## 2021-10-06 DIAGNOSIS — R7301 Impaired fasting glucose: Secondary | ICD-10-CM | POA: Diagnosis not present

## 2021-10-06 MED ORDER — GABAPENTIN 300 MG PO CAPS: ORAL_CAPSULE | ORAL | 1 refills | Status: DC

## 2021-10-07 LAB — BASIC METABOLIC PANEL
BUN/Creatinine Ratio: 17 (ref 12–28)
BUN: 16 mg/dL (ref 8–27)
CO2: 21 mmol/L (ref 20–29)
Calcium: 9.5 mg/dL (ref 8.7–10.3)
Chloride: 102 mmol/L (ref 96–106)
Creatinine, Ser: 0.92 mg/dL (ref 0.57–1.00)
Glucose: 112 mg/dL — ABNORMAL HIGH (ref 70–99)
Potassium: 3.7 mmol/L (ref 3.5–5.2)
Sodium: 141 mmol/L (ref 134–144)
eGFR: 66 mL/min/{1.73_m2} (ref >59)

## 2021-10-07 LAB — LIPID PANEL
Chol/HDL Ratio: 3.1 ratio (ref 0.0–4.4)
Cholesterol, Total: 105 mg/dL (ref 100–199)
HDL: 34 mg/dL — ABNORMAL LOW (ref >39)
LDL Chol Calc (NIH): 49 mg/dL (ref 0–99)
Triglycerides: 124 mg/dL (ref 0–149)
VLDL Cholesterol Cal: 22 mg/dL (ref 5–40)

## 2021-10-07 LAB — CBC WITH DIFFERENTIAL/PLATELET
Basophils Absolute: 0.1 10*3/uL (ref 0.0–0.2)
Basos: 1 %
EOS (ABSOLUTE): 0.1 10*3/uL (ref 0.0–0.4)
Eos: 3 %
Hematocrit: 32.7 % — ABNORMAL LOW (ref 34.0–46.6)
Hemoglobin: 10.4 g/dL — ABNORMAL LOW (ref 11.1–15.9)
Immature Grans (Abs): 0.1 10*3/uL (ref 0.0–0.1)
Immature Granulocytes: 2 %
Lymphocytes Absolute: 1.6 10*3/uL (ref 0.7–3.1)
Lymphs: 37 %
MCH: 27.9 pg (ref 26.6–33.0)
MCHC: 31.8 g/dL (ref 31.5–35.7)
MCV: 88 fL (ref 79–97)
Monocytes Absolute: 0.3 10*3/uL (ref 0.1–0.9)
Monocytes: 8 %
Neutrophils Absolute: 2.1 10*3/uL (ref 1.4–7.0)
Neutrophils: 49 %
Platelets: 202 10*3/uL (ref 150–450)
RBC: 3.73 x10E6/uL — ABNORMAL LOW (ref 3.77–5.28)
RDW: 15.1 % (ref 11.7–15.4)
WBC: 4.2 10*3/uL (ref 3.4–10.8)

## 2021-10-07 LAB — B12 AND FOLATE PANEL
Folate: 8 ng/mL (ref >3.0)
Vitamin B-12: 515 pg/mL (ref 232–1245)

## 2021-10-07 LAB — MAGNESIUM: Magnesium: 1.3 mg/dL — ABNORMAL LOW (ref 1.6–2.3)

## 2021-10-07 LAB — VITAMIN D 25 HYDROXY (VIT D DEFICIENCY, FRACTURES): Vit D, 25-Hydroxy: 36.3 ng/mL (ref 30.0–100.0)

## 2021-10-09 ENCOUNTER — Ambulatory Visit: Payer: Medicare HMO | Admitting: Physician Assistant

## 2021-10-11 ENCOUNTER — Telehealth: Payer: Self-pay

## 2021-10-11 NOTE — Telephone Encounter (Signed)
Gave verbal order to [facility]  to connie [phone] for once a week for 6 week

## 2021-10-12 ENCOUNTER — Telehealth: Payer: Self-pay

## 2021-10-12 DIAGNOSIS — R509 Fever, unspecified: Secondary | ICD-10-CM | POA: Diagnosis not present

## 2021-10-12 DIAGNOSIS — Z5321 Procedure and treatment not carried out due to patient leaving prior to being seen by health care provider: Secondary | ICD-10-CM | POA: Diagnosis not present

## 2021-10-12 DIAGNOSIS — R9431 Abnormal electrocardiogram [ECG] [EKG]: Secondary | ICD-10-CM | POA: Diagnosis not present

## 2021-10-12 DIAGNOSIS — N390 Urinary tract infection, site not specified: Secondary | ICD-10-CM | POA: Diagnosis not present

## 2021-10-12 DIAGNOSIS — R531 Weakness: Secondary | ICD-10-CM | POA: Diagnosis not present

## 2021-10-12 NOTE — Telephone Encounter (Signed)
Pt daughter called that she still having UTI her cloudy advised her to do labs order urine sample pt unable to do it urine as per alyssa advised her [place] ED

## 2021-10-13 DIAGNOSIS — R109 Unspecified abdominal pain: Secondary | ICD-10-CM | POA: Diagnosis not present

## 2021-10-13 DIAGNOSIS — E1140 Type 2 diabetes mellitus with diabetic neuropathy, unspecified: Secondary | ICD-10-CM | POA: Diagnosis not present

## 2021-10-13 DIAGNOSIS — E785 Hyperlipidemia, unspecified: Secondary | ICD-10-CM | POA: Diagnosis not present

## 2021-10-13 DIAGNOSIS — I110 Hypertensive heart disease with heart failure: Secondary | ICD-10-CM | POA: Diagnosis not present

## 2021-10-13 DIAGNOSIS — J449 Chronic obstructive pulmonary disease, unspecified: Secondary | ICD-10-CM | POA: Diagnosis not present

## 2021-10-13 DIAGNOSIS — R9431 Abnormal electrocardiogram [ECG] [EKG]: Secondary | ICD-10-CM | POA: Diagnosis not present

## 2021-10-13 DIAGNOSIS — Z1629 Resistance to other single specified antibiotic: Secondary | ICD-10-CM | POA: Diagnosis not present

## 2021-10-13 DIAGNOSIS — N3000 Acute cystitis without hematuria: Secondary | ICD-10-CM | POA: Diagnosis not present

## 2021-10-13 DIAGNOSIS — G4733 Obstructive sleep apnea (adult) (pediatric): Secondary | ICD-10-CM | POA: Diagnosis not present

## 2021-10-13 DIAGNOSIS — R4182 Altered mental status, unspecified: Secondary | ICD-10-CM | POA: Diagnosis not present

## 2021-10-13 DIAGNOSIS — F32A Depression, unspecified: Secondary | ICD-10-CM | POA: Diagnosis not present

## 2021-10-13 DIAGNOSIS — R404 Transient alteration of awareness: Secondary | ICD-10-CM | POA: Diagnosis not present

## 2021-10-13 DIAGNOSIS — I5020 Unspecified systolic (congestive) heart failure: Secondary | ICD-10-CM | POA: Diagnosis not present

## 2021-10-17 ENCOUNTER — Telehealth: Payer: Self-pay

## 2021-10-17 NOTE — Telephone Encounter (Signed)
Lvm to schedule hospital follow up-Toni ?

## 2021-10-17 NOTE — Telephone Encounter (Signed)
[facility] [phone] called that just FYI that pt missed PT visit today they reschedule Thursday

## 2021-10-23 ENCOUNTER — Ambulatory Visit (INDEPENDENT_AMBULATORY_CARE_PROVIDER_SITE_OTHER): Payer: Medicare HMO | Admitting: Nurse Practitioner

## 2021-10-23 ENCOUNTER — Encounter: Payer: Self-pay | Admitting: Nurse Practitioner

## 2021-10-23 VITALS — BP 122/68 | HR 63 | Temp 98.3°F | Resp 16 | Ht 60.0 in

## 2021-10-23 DIAGNOSIS — I5022 Chronic systolic (congestive) heart failure: Secondary | ICD-10-CM

## 2021-10-23 DIAGNOSIS — Z09 Encounter for follow-up examination after completed treatment for conditions other than malignant neoplasm: Secondary | ICD-10-CM

## 2021-10-23 DIAGNOSIS — I10 Essential (primary) hypertension: Secondary | ICD-10-CM

## 2021-10-23 NOTE — Progress Notes (Signed)
[facility] [address], [address] [place] [address] [facility] Discharge Acute Issues Care Follow Up                                                                        Patient Demographics  Diana Bernard, is a 76 y.o. female  DOB 14-Sep-1945  MRN [id number].  Primary MD  Jonetta Osgood, NP   Reason for TCC follow Up - complicated UTI, possible urosepsis   Past Medical History:  Diagnosis Date   Arthritis    Asthma    Cancer ([place])    Deep vein thrombosis (DVT) ([place]) 2015   Depression    GERD (gastroesophageal reflux disease)    Hypertension    Weakness of both legs     Past Surgical History:  Procedure Laterality Date   ABCESS DRAINAGE  2016   Abdominal abcess due to diverticulitis   caridoverter defibrillator  11/14/2017   ICD   CATARACT EXTRACTION W/PHACO Left 10/26/2015   Procedure: CATARACT EXTRACTION PHACO AND INTRAOCULAR LENS PLACEMENT ([place]) LEFT EYE;  Surgeon: Leandrew Koyanagi, MD;  Location: [place];  Service: Ophthalmology;  Laterality: Left;   CATARACT EXTRACTION W/PHACO Right 12/07/2015   Procedure: CATARACT EXTRACTION PHACO AND INTRAOCULAR LENS PLACEMENT (IOC);  Surgeon: Leandrew Koyanagi, MD;  Location: [place];  Service: Ophthalmology;  Laterality: Right;   CHOLECYSTECTOMY     FOOT SURGERY     Per patient, bilateral foot surgery.   OSTOMY  01/25/2021   OSTOMY  02/11/2021   PELVIC FRACTURE SURGERY     Per patient for cancer.     Recent HPI and Hospital Course  Patient initially went to [facility] ED for sx of UTI but the wait was too long so her family took her to [facility] ER in [place] instead and she was admitted for complicated UTI and possible urosepsis. The records are not available for review from [facility], even through care everywhere. She was treated with IV antibiotics and discharged home with family  after a few days.   [facility] Acute Care Issue to be followed in the Clinic   Hypertension Chronic systolic heart failure Dilated cardiomyopathy OSA on cpap Hypothyroidism PAD    Subjective:   Milinda Pointer today has, No headache, No chest pain, No abdominal pain - No Nausea, No new weakness tingling or numbness, No Cough - SOB. Residual UTI sx but still improving and resolving   Assessment & Plan    1. Hospital discharge follow-up Treated in [facility] hospital for complicated UTI and urosepsis with IV antibiotics. Discharged home, has home health and PT   2. Hypomagnesemia Low magnesium while in the hospital but this was treated and has resolved.   3. Essential hypertension BP well controlled with current meds, no changes  4. Chronic systolic heart failure (HCC) Stable, continue current medications, no changes   Reason for frequent admissions/ER visits    heart failure PAD At risk for pressure ulcers but is starting to move  around easier.  Hx of rectal cancer but was treated Chronic low back pain with sciatica Hypertension Cardiomyopathy    Objective:   Vitals:   10/23/21 0956  BP: 122/68  Pulse: 63  Resp: 16  Temp: 98.3 F (36.8 C)  SpO2: 97%  Height: 5' (1.524 m)    Wt Readings from Last 3 Encounters:  11/06/21 241 lb (109.3 kg)  10/02/21 245 lb (111.1 kg)  07/04/21 240 lb (108.9 kg)    Allergies as of 10/23/2021   No Known Allergies      Medication List        Accurate as of October 23, 2021 11:59 PM. If you have any questions, ask your nurse or doctor.          STOP taking these medications    Magnesium 250 MG Tabs Stopped by: Jonetta Osgood, NP       TAKE these medications    acetaminophen 500 MG tablet Commonly known as: TYLENOL Take 500 mg by mouth every 8 (eight) hours. Take 2 tablets by mouth every 8 hours.   albuterol 108 (90 Base) MCG/ACT inhaler Commonly known as: VENTOLIN HFA Inhale 2 puffs into the lungs  every 6 (six) hours as needed for wheezing or shortness of breath.   alendronate 70 MG tablet Commonly known as: FOSAMAX Take 1 tablet (70 mg total) by mouth every 7 (seven) days. Take with a full glass of water on an empty stomach.   allopurinol 100 MG tablet Commonly known as: ZYLOPRIM Take 1 tablet (100 mg total) by mouth daily.   cholecalciferol 1000 units tablet Commonly known as: VITAMIN D Take 1,000 Units by mouth daily.   clotrimazole 1 % cream Commonly known as: LOTRIMIN Apply 1 Application topically 2 (two) times daily. Apply topically 2x daily.   colchicine 0.6 MG tablet Take 1 tablet (0.6 mg total) by mouth daily as needed (gout flare).   diclofenac Sodium 1 % Gel Commonly known as: VOLTAREN Apply topically 4 (four) times daily.   esomeprazole 20 MG capsule Commonly known as: NEXIUM Take 20 mg by mouth daily at 12 noon.   famotidine 20 MG tablet Commonly known as: PEPCID Take 20 mg by mouth 2 (two) times daily.   fluticasone-salmeterol 250-50 MCG/ACT Aepb Commonly known as: ADVAIR Inhale 1 puff into the lungs in the morning and at bedtime.   furosemide 20 MG tablet Commonly known as: LASIX Take 1 tablet (20 mg total) by mouth daily.   gabapentin 300 MG capsule Commonly known as: NEURONTIN Take 1 cap in morning and 3 cap at bedtime   ipratropium-albuterol 0.5-2.5 (3) MG/3ML Soln Commonly known as: DUONEB Take 3 mLs by nebulization every 4 (four) hours as needed.   Jardiance 10 MG Tabs tablet Generic drug: empagliflozin Take 10 mg by mouth daily.   lidocaine 4 % Place 1 patch onto the skin daily.   lisinopril 20 MG tablet Commonly known as: ZESTRIL Take 1 tablet (20 mg total) by mouth daily.   magnesium oxide 400 (240 Mg) MG tablet Commonly known as: MAG-OX Take 400 mg by mouth 2 (two) times daily.   melatonin 3 MG Tabs tablet Take 3 mg by mouth at bedtime.   metoprolol succinate 50 MG 24 hr tablet Commonly known as: TOPROL-XL Take 1  tablet (50 mg total) by mouth daily. Take with or immediately following a meal.   multivitamin capsule Take 1 capsule by mouth daily.   nitrofurantoin (macrocrystal-monohydrate) 100 MG capsule Commonly known as: Macrobid Take  1 capsule (100 mg total) by mouth every Monday, Wednesday, and Friday.   rosuvastatin 10 MG tablet Commonly known as: CRESTOR TAKE 1 TABLET TWICE WEEKLY   Trulicity 1.5 MC/8.0EM Sopn Generic drug: Dulaglutide Inject 1.5 mg into the skin once a week.   venlafaxine XR 37.5 MG 24 hr capsule Commonly known as: EFFEXOR-XR Take 1 capsule (37.5 mg total) by mouth daily.   VITAMIN B-12 PO Take by mouth. Pt takes spring [place] gummies         Physical Exam: Constitutional: Patient appears well-developed and well-nourished. Not in obvious distress. HENT: Normocephalic, atraumatic, External right and left ear normal. Oropharynx is clear and moist.  Eyes: Conjunctivae and EOM are normal. PERRLA, no scleral icterus. Neck: Normal ROM. Neck supple. No JVD. No tracheal deviation. No thyromegaly. CVS: RRR, S1/S2 +, no murmurs, no gallops, no carotid bruit.  Pulmonary: Effort and breath sounds normal, no stridor, rhonchi, wheezes, rales.  Abdominal: Soft. BS +, no distension, tenderness, rebound or guarding.  Musculoskeletal: Normal range of motion. No edema and no tenderness.  Lymphadenopathy: No lymphadenopathy noted, cervical, inguinal or axillary Neuro: Alert. Normal reflexes, muscle tone coordination. No cranial nerve deficit. Skin: Skin is warm and dry. No rash noted. Not diaphoretic. No erythema. No pallor. Psychiatric: Normal mood and affect. Behavior, judgment, thought content normal.   Data Review   Micro Results No results found for this or any previous visit (from the past 240 hour(s)).   CBC No results for input(s): "WBC", "HGB", "HCT", "PLT", "MCV", "MCH", "MCHC", "RDW", "LYMPHSABS", "MONOABS", "EOSABS", "BASOSABS", "BANDABS" in the last 168  hours.  Invalid input(s): "NEUTRABS", "BANDSABD"  Chemistries  No results for input(s): "NA", "K", "CL", "CO2", "GLUCOSE", "BUN", "CREATININE", "CALCIUM", "MG", "AST", "ALT", "ALKPHOS", "BILITOT" in the last 168 hours.  Invalid input(s): "GFRCGP" ------------------------------------------------------------------------------------------------------------------ CrCl cannot be calculated (Patient's most recent lab result is older than the maximum 21 days allowed.). ------------------------------------------------------------------------------------------------------------------ No results for input(s): "HGBA1C" in the last 72 hours. ------------------------------------------------------------------------------------------------------------------ No results for input(s): "CHOL", "HDL", "LDLCALC", "TRIG", "CHOLHDL", "LDLDIRECT" in the last 72 hours. ------------------------------------------------------------------------------------------------------------------ No results for input(s): "TSH", "T4TOTAL", "T3FREE", "THYROIDAB" in the last 72 hours.  Invalid input(s): "FREET3" ------------------------------------------------------------------------------------------------------------------ No results for input(s): "VITAMINB12", "FOLATE", "FERRITIN", "TIBC", "IRON", "RETICCTPCT" in the last 72 hours.  Coagulation profile No results for input(s): "INR", "PROTIME" in the last 168 hours.  No results for input(s): "DDIMER" in the last 72 hours.  Cardiac Enzymes No results for input(s): "CKMB", "TROPONINI", "MYOGLOBIN" in the last 168 hours.  Invalid input(s): "CK" ------------------------------------------------------------------------------------------------------------------ Invalid input(s): "POCBNP"  Return for previously scheduled, CPE, Ulonda Klosowski PCP upcoming soon.   Time Spent in minutes  45 Time spent with patient included reviewing progress notes, labs, imaging studies, and discussing  plan for follow up.   This patient was seen by Jonetta Osgood, FNP-C in collaboration with Dr. Clayborn Bigness as a part of collaborative care agreement.    Jonetta Osgood MSN, FNP-C on 12/14/2021 at 8:22 AM   **Disclaimer: This note may have been dictated with voice recognition software. Similar sounding words can inadvertently be transcribed and this note may contain transcription errors which may not have been corrected upon publication of note.**

## 2021-10-25 ENCOUNTER — Telehealth: Payer: Self-pay

## 2021-10-30 ENCOUNTER — Telehealth: Payer: Self-pay

## 2021-10-30 NOTE — Telephone Encounter (Signed)
Left vm and sent mychart message to confirm 11/06/21 appointment-Toni

## 2021-11-06 ENCOUNTER — Ambulatory Visit (INDEPENDENT_AMBULATORY_CARE_PROVIDER_SITE_OTHER): Payer: Medicare HMO | Admitting: Nurse Practitioner

## 2021-11-06 ENCOUNTER — Encounter: Payer: Self-pay | Admitting: Nurse Practitioner

## 2021-11-06 VITALS — BP 128/62 | HR 75 | Temp 98.4°F | Resp 16 | Ht 60.0 in | Wt 241.0 lb

## 2021-11-06 DIAGNOSIS — G4733 Obstructive sleep apnea (adult) (pediatric): Secondary | ICD-10-CM | POA: Diagnosis not present

## 2021-11-06 DIAGNOSIS — E039 Hypothyroidism, unspecified: Secondary | ICD-10-CM | POA: Diagnosis not present

## 2021-11-06 DIAGNOSIS — R300 Dysuria: Secondary | ICD-10-CM | POA: Diagnosis not present

## 2021-11-06 DIAGNOSIS — I5022 Chronic systolic (congestive) heart failure: Secondary | ICD-10-CM

## 2021-11-06 DIAGNOSIS — Z0001 Encounter for general adult medical examination with abnormal findings: Secondary | ICD-10-CM | POA: Diagnosis not present

## 2021-11-06 DIAGNOSIS — Z9989 Dependence on other enabling machines and devices: Secondary | ICD-10-CM

## 2021-11-06 DIAGNOSIS — Z933 Colostomy status: Secondary | ICD-10-CM | POA: Diagnosis not present

## 2021-11-06 DIAGNOSIS — I10 Essential (primary) hypertension: Secondary | ICD-10-CM | POA: Diagnosis not present

## 2021-11-06 NOTE — Progress Notes (Signed)
[facility] [address], [place] [address]  Internal MEDICINE  Office Visit Note  Patient Name: Diana Bernard  [id number]  [id number]  Date of Service: 11/06/2021  Chief Complaint  Patient presents with   Medicare Wellness   Depression   Gastroesophageal Reflux   Hypertension   Asthma    HPI Deisi presents for an annual well visit and physical exam.  She is a well-appearing 76 year old female with hypertension, heart failure, peripheral arterial disease, obstructive sleep apnea, hypothyroidism and osteoporosis.  She has a history of rectal cancer and a permanent colostomy.  She does see multiple specialists and has been doing better getting to her appointments now that she is able to move around better.  She has been working with physical therapy and she is now able to stand and walk 20 steps back-and-forth for 3 laps.  This is a significant improvement compared to last year in the fall when the patient was still primarily bedridden.  Her current weight is 241 pounds which is significantly improved from June last year when she weighed 271 pounds. --Her magnesium level was low when she was discharged from the hospital the last time she was hospitalized but it has since improved. ---has  chronic pain but she has had no new or worsening pain.   --blood pressure and other vital signs are stable and controlled with current medications.   --due for a routine screening mammogram but she is not able to stay and consistently for the length of time necessary to get the mammogram done.  The patient does want to have a mammogram and will notify the clinic when she has progressed in her physical therapy that she is able to stand for a longer period of time and her mammogram will be ordered.  ---- no other preventive screenings due at this time and she has had many labs drawn so we will not order further labs for right now. -- good support system.  Her daughter is present with  her at her office visit today and helps her out a lot and her granddaughter also helps her a lot as well.    Current Medication: Outpatient Encounter Medications as of 11/06/2021  Medication Sig   acetaminophen (TYLENOL) 500 MG tablet Take 500 mg by mouth every 8 (eight) hours. Take 2 tablets by mouth every 8 hours.   albuterol (VENTOLIN HFA) 108 (90 Base) MCG/ACT inhaler Inhale 2 puffs into the lungs every 6 (six) hours as needed for wheezing or shortness of breath.   alendronate (FOSAMAX) 70 MG tablet Take 1 tablet (70 mg total) by mouth every 7 (seven) days. Take with a full glass of water on an empty stomach.   allopurinol (ZYLOPRIM) 100 MG tablet Take 1 tablet (100 mg total) by mouth daily.   cholecalciferol (VITAMIN D) 1000 units tablet Take 1,000 Units by mouth daily.   clotrimazole (LOTRIMIN) 1 % cream Apply 1 Application topically 2 (two) times daily. Apply topically 2x daily.   colchicine 0.6 MG tablet Take 1 tablet (0.6 mg total) by mouth daily as needed (gout flare).   Cyanocobalamin (VITAMIN B-12 PO) Take by mouth. Pt takes spring [place] gummies   diclofenac Sodium (VOLTAREN) 1 % GEL Apply topically 4 (four) times daily.   esomeprazole (NEXIUM) 20 MG capsule Take 20 mg by mouth daily at 12 noon.   famotidine (PEPCID) 20 MG tablet Take 20 mg by mouth 2 (two) times daily.   fluticasone-salmeterol (ADVAIR) 250-50 MCG/ACT AEPB Inhale  1 puff into the lungs in the morning and at bedtime.   gabapentin (NEURONTIN) 300 MG capsule Take 1 cap in morning and 3 cap at bedtime   ipratropium-albuterol (DUONEB) 0.5-2.5 (3) MG/3ML SOLN Take 3 mLs by nebulization every 4 (four) hours as needed.   lidocaine 4 % Place 1 patch onto the skin daily.   lisinopril (ZESTRIL) 20 MG tablet Take 1 tablet (20 mg total) by mouth daily.   magnesium oxide (MAG-OX) 400 (240 Mg) MG tablet Take 400 mg by mouth 2 (two) times daily.   melatonin 3 MG TABS tablet Take 3 mg by mouth at bedtime.   metoprolol succinate  (TOPROL-XL) 50 MG 24 hr tablet Take 1 tablet (50 mg total) by mouth daily. Take with or immediately following a meal.   Multiple Vitamin (MULTIVITAMIN) capsule Take 1 capsule by mouth daily.   rosuvastatin (CRESTOR) 10 MG tablet TAKE 1 TABLET TWICE WEEKLY   [DISCONTINUED] furosemide (LASIX) 20 MG tablet Take 1 tablet (20 mg total) by mouth daily.   [DISCONTINUED] nitrofurantoin, macrocrystal-monohydrate, (MACROBID) 100 MG capsule Take 1 capsule (100 mg total) by mouth every Monday, Wednesday, and Friday.   [DISCONTINUED] venlafaxine XR (EFFEXOR-XR) 37.5 MG 24 hr capsule Take 1 capsule (37.5 mg total) by mouth daily.   [DISCONTINUED] Dulaglutide (TRULICITY) 1.5 JW/1.1BJ SOPN Inject 1.5 mg into the skin once a week. (Patient not taking: Reported on 11/06/2021)   [DISCONTINUED] empagliflozin (JARDIANCE) 10 MG TABS tablet Take 10 mg by mouth daily. (Patient not taking: Reported on 11/06/2021)   No facility-administered encounter medications on file as of 11/06/2021.    Surgical History: Past Surgical History:  Procedure Laterality Date   ABCESS DRAINAGE  2016   Abdominal abcess due to diverticulitis   caridoverter defibrillator  11/14/2017   ICD   CATARACT EXTRACTION W/PHACO Left 10/26/2015   Procedure: CATARACT EXTRACTION PHACO AND INTRAOCULAR LENS PLACEMENT ([facility]) LEFT EYE;  Surgeon: Leandrew Koyanagi, MD;  Location: [place];  Service: Ophthalmology;  Laterality: Left;   CATARACT EXTRACTION W/PHACO Right 12/07/2015   Procedure: CATARACT EXTRACTION PHACO AND INTRAOCULAR LENS PLACEMENT ([facility]);  Surgeon: Leandrew Koyanagi, MD;  Location: [place];  Service: Ophthalmology;  Laterality: Right;   CHOLECYSTECTOMY     FOOT SURGERY     Per patient, bilateral foot surgery.   OSTOMY  01/25/2021   OSTOMY  02/11/2021   PELVIC FRACTURE SURGERY     Per patient for cancer.    Medical History: Past Medical History:  Diagnosis Date   Arthritis    Asthma    Cancer ([place])    Deep  vein thrombosis (DVT) ([place]) 2015   Depression    GERD (gastroesophageal reflux disease)    Hypertension    Weakness of both legs     Family History: Family History  Problem Relation Age of Onset   Depression Sister    Heart disease Sister    Early death Sister    Depression Sister    Depression Sister    Depression Sister    Depression Sister     Social History   Socioeconomic History   Marital status: Married    Spouse name: Not on file   Number of children: Not on file   Years of education: Not on file   Highest education level: Not on file  Occupational History   Not on file  Tobacco Use   Smoking status: Never   Smokeless tobacco: Never  Vaping Use   Vaping Use: Never used  Substance and Sexual Activity   Alcohol use: No   Drug use: No   Sexual activity: Not Currently  Other Topics Concern   Not on file  Social History Narrative   Not on file   Social Determinants of Health   Financial Resource Strain: Low Risk  (08/03/2020)   Overall Financial Resource Strain (CARDIA)    Difficulty of Paying Living Expenses: Not very hard  Food Insecurity: Not on file  Transportation Needs: Not on file  Physical Activity: Not on file  Stress: Not on file  Social Connections: Not on file  Intimate Partner Violence: Not on file      Review of Systems  Constitutional:  Positive for activity change (activity and physical ability is improving with physical therapy.). Negative for appetite change, chills, fatigue, fever and unexpected weight change.  HENT: Negative.  Negative for congestion, ear pain, rhinorrhea, sore throat and trouble swallowing.   Eyes: Negative.   Respiratory: Negative.  Negative for cough, chest tightness, shortness of breath and wheezing.   Cardiovascular: Negative.  Negative for chest pain and palpitations.  Gastrointestinal: Negative.  Negative for abdominal pain, blood in stool, constipation, diarrhea, nausea and vomiting.  Endocrine: Negative.    Genitourinary: Negative.  Negative for difficulty urinating, dysuria, frequency, hematuria and urgency.  Musculoskeletal:  Positive for gait problem (currently has physical therapy visits, often uses wheel chair for ambulation but is able to walk 20 steps back and forth x3 with physical therapy. she is improving, several months ago she was bedridden). Negative for arthralgias, back pain, joint swelling, myalgias and neck pain.  Skin: Negative.  Negative for rash and wound.  Allergic/Immunologic: Negative.  Negative for immunocompromised state.  Neurological:  Negative for dizziness, seizures, numbness and headaches.  Hematological: Negative.   Psychiatric/Behavioral:  Positive for depression. Negative for behavioral problems, self-injury and suicidal ideas. The patient is not nervous/anxious.     Vital Signs: BP 128/62   Pulse 75   Temp 98.4 F (36.9 C)   Resp 16   Ht 5' (1.524 m)   Wt 241 lb (109.3 kg)   SpO2 98%   BMI 47.07 kg/m    Physical Exam Vitals reviewed.  Constitutional:      General: She is awake. She is not in acute distress.    Appearance: Normal appearance. She is well-developed and well-groomed. She is obese. She is not ill-appearing.  HENT:     Head: Normocephalic and atraumatic.     Right Ear: Tympanic membrane, ear canal and external ear normal.     Left Ear: Tympanic membrane, ear canal and external ear normal.     Nose: Nose normal.     Mouth/Throat:     Mouth: Mucous membranes are moist.     Pharynx: Oropharynx is clear. No posterior oropharyngeal erythema.  Eyes:     Extraocular Movements: Extraocular movements intact.     Pupils: Pupils are equal, round, and reactive to light.  Cardiovascular:     Rate and Rhythm: Normal rate and regular rhythm.     Pulses: Normal pulses.     Heart sounds: Normal heart sounds.  Pulmonary:     Effort: Pulmonary effort is normal.     Breath sounds: Normal breath sounds.  Abdominal:     General: The ostomy site is  clean. Bowel sounds are normal. There is no distension.     Palpations: Abdomen is soft.     Tenderness: There is no abdominal tenderness.     Hernia: No  hernia is present.     Comments: Colostomy device in place, clean dry and intact, area of stoma that is visible through ostomy back is normal, pink, no erythema, swelling or irritation noted.   Musculoskeletal:     Right lower leg: No edema.     Left lower leg: No edema.  Lymphadenopathy:     Cervical: No cervical adenopathy.  Skin:    General: Skin is warm and dry.     Capillary Refill: Capillary refill takes less than 2 seconds.  Neurological:     General: No focal deficit present.     Mental Status: She is alert and oriented to person, place, and time.     Gait: Gait abnormal (improving).  Psychiatric:        Mood and Affect: Mood normal.        Behavior: Behavior normal. Behavior is cooperative.        Assessment/Plan: 1. Encounter for general adult medical examination with abnormal findings Age-appropriate preventive screenings and vaccinations discussed, annual physical exam completed. Routine labs for health maintenance have been done recently and were previously discussed. PHM updated.   2. Essential hypertension Controlled with current medications.   3. Chronic systolic heart failure ([place]) Followed by cardiology.   4. Acquired hypothyroidism Taking levothyroxine, most recent labs are stable.   5. Hypomagnesemia Has been low in the past, is resolved at this time.   6. OSA on CPAP Using CPAP, no issues. Does not need supplies and is able to get a good night's sleep and wakes up refreshed.   7. Dysuria Routine urinalysis, she will provide specimen at [facility], unable to urinate at today's visit.  - UA/M w/rflx Culture, Routine  8. S/P colostomy ([place]) Device in place, clean dry and intact. Is using a small amount of duoderm to help keep the seal around the device, she received the wrong type of device with her  last delivery of supplies.       General Counseling: Lavanna verbalizes understanding of the findings of todays visit and agrees with plan of treatment. I have discussed any further diagnostic evaluation that may be needed or ordered today. We also reviewed her medications today. she has been encouraged to call the office with any questions or concerns that should arise related to todays visit.    Orders Placed This Encounter  Procedures   UA/M w/rflx Culture, Routine    No orders of the defined types were placed in this encounter.   Return in about 3 months (around 02/06/2022) for F/U, Chasity Outten PCP please reschedule september appt for 3 months out from now. .   Total time spent:30 Minutes Time spent includes review of chart, medications, test results, and follow up plan with the patient.   [place] Controlled Substance Database was reviewed by me.  This patient was seen by Jonetta Osgood, FNP-C in collaboration with Dr. Clayborn Bigness as a part of collaborative care agreement.  Jamin Panther R. Valetta Fuller, MSN, FNP-C Internal medicine

## 2021-11-17 ENCOUNTER — Telehealth: Payer: Self-pay

## 2021-11-17 NOTE — Telephone Encounter (Signed)
Received that pt home health is denied I spoke with [facility] they will appeal it and this is just [facility]

## 2021-11-24 ENCOUNTER — Other Ambulatory Visit: Payer: Self-pay | Admitting: Nurse Practitioner

## 2021-11-24 DIAGNOSIS — F321 Major depressive disorder, single episode, moderate: Secondary | ICD-10-CM

## 2021-11-26 ENCOUNTER — Encounter: Payer: Self-pay | Admitting: Nurse Practitioner

## 2021-11-29 DIAGNOSIS — Z933 Colostomy status: Secondary | ICD-10-CM | POA: Diagnosis not present

## 2021-12-04 ENCOUNTER — Other Ambulatory Visit: Payer: Self-pay

## 2021-12-04 DIAGNOSIS — S31829A Unspecified open wound of left buttock, initial encounter: Secondary | ICD-10-CM | POA: Diagnosis not present

## 2021-12-04 DIAGNOSIS — Z433 Encounter for attention to colostomy: Secondary | ICD-10-CM | POA: Diagnosis not present

## 2021-12-04 DIAGNOSIS — S31101A Unspecified open wound of abdominal wall, left upper quadrant without penetration into peritoneal cavity, initial encounter: Secondary | ICD-10-CM | POA: Diagnosis not present

## 2021-12-04 DIAGNOSIS — Z933 Colostomy status: Secondary | ICD-10-CM | POA: Diagnosis not present

## 2021-12-04 DIAGNOSIS — I10 Essential (primary) hypertension: Secondary | ICD-10-CM

## 2021-12-04 DIAGNOSIS — N390 Urinary tract infection, site not specified: Secondary | ICD-10-CM

## 2021-12-04 DIAGNOSIS — E782 Mixed hyperlipidemia: Secondary | ICD-10-CM

## 2021-12-04 DIAGNOSIS — I5022 Chronic systolic (congestive) heart failure: Secondary | ICD-10-CM

## 2021-12-04 MED ORDER — FUROSEMIDE 20 MG PO TABS: 20.0000 mg | ORAL_TABLET | Freq: Every day | ORAL | 3 refills | Status: DC

## 2021-12-04 MED ORDER — NITROFURANTOIN MONOHYD MACRO 100 MG PO CAPS: 100.0000 mg | ORAL_CAPSULE | ORAL | 2 refills | Status: DC

## 2021-12-08 DIAGNOSIS — S31829A Unspecified open wound of left buttock, initial encounter: Secondary | ICD-10-CM | POA: Diagnosis not present

## 2021-12-08 DIAGNOSIS — Z933 Colostomy status: Secondary | ICD-10-CM | POA: Diagnosis not present

## 2021-12-08 DIAGNOSIS — Z433 Encounter for attention to colostomy: Secondary | ICD-10-CM | POA: Diagnosis not present

## 2021-12-08 DIAGNOSIS — S31101A Unspecified open wound of abdominal wall, left upper quadrant without penetration into peritoneal cavity, initial encounter: Secondary | ICD-10-CM | POA: Diagnosis not present

## 2021-12-11 ENCOUNTER — Other Ambulatory Visit: Payer: Self-pay

## 2021-12-11 DIAGNOSIS — J45991 Cough variant asthma: Secondary | ICD-10-CM

## 2021-12-11 NOTE — Telephone Encounter (Signed)
Pt called requesting neb supplies for machine, duo neb solution.  I spoke to Judson Roch from [facility] and she advised pt had not got any CPAP supplies since 6.15.22.ibuprofen sent a community message to [employer] requesting CPAP supplies, nebulizer supplies and duo neb solution for nebulizer as pt has been moved from [facility] to [place].

## 2021-12-14 ENCOUNTER — Encounter: Payer: Self-pay | Admitting: Nurse Practitioner

## 2021-12-15 ENCOUNTER — Encounter: Payer: Self-pay | Admitting: Nurse Practitioner

## 2022-01-02 DIAGNOSIS — Z4509 Encounter for adjustment and management of other cardiac device: Secondary | ICD-10-CM | POA: Diagnosis not present

## 2022-01-02 DIAGNOSIS — I509 Heart failure, unspecified: Secondary | ICD-10-CM | POA: Diagnosis not present

## 2022-01-04 ENCOUNTER — Ambulatory Visit: Payer: Medicare HMO | Admitting: Nurse Practitioner

## 2022-01-06 DIAGNOSIS — Z933 Colostomy status: Secondary | ICD-10-CM | POA: Diagnosis not present

## 2022-01-06 DIAGNOSIS — S31829A Unspecified open wound of left buttock, initial encounter: Secondary | ICD-10-CM | POA: Diagnosis not present

## 2022-01-06 DIAGNOSIS — S31101A Unspecified open wound of abdominal wall, left upper quadrant without penetration into peritoneal cavity, initial encounter: Secondary | ICD-10-CM | POA: Diagnosis not present

## 2022-01-06 DIAGNOSIS — Z433 Encounter for attention to colostomy: Secondary | ICD-10-CM | POA: Diagnosis not present

## 2022-01-16 DIAGNOSIS — S31829A Unspecified open wound of left buttock, initial encounter: Secondary | ICD-10-CM | POA: Diagnosis not present

## 2022-01-16 DIAGNOSIS — Z933 Colostomy status: Secondary | ICD-10-CM | POA: Diagnosis not present

## 2022-01-16 DIAGNOSIS — Z433 Encounter for attention to colostomy: Secondary | ICD-10-CM | POA: Diagnosis not present

## 2022-01-16 DIAGNOSIS — S31101A Unspecified open wound of abdominal wall, left upper quadrant without penetration into peritoneal cavity, initial encounter: Secondary | ICD-10-CM | POA: Diagnosis not present

## 2022-01-29 DIAGNOSIS — C20 Malignant neoplasm of rectum: Secondary | ICD-10-CM | POA: Diagnosis not present

## 2022-01-29 DIAGNOSIS — C44300 Unspecified malignant neoplasm of skin of unspecified part of face: Secondary | ICD-10-CM | POA: Diagnosis not present

## 2022-01-29 DIAGNOSIS — Z933 Colostomy status: Secondary | ICD-10-CM | POA: Diagnosis not present

## 2022-02-05 ENCOUNTER — Ambulatory Visit (INDEPENDENT_AMBULATORY_CARE_PROVIDER_SITE_OTHER): Payer: Medicare HMO | Admitting: Nurse Practitioner

## 2022-02-05 ENCOUNTER — Encounter: Payer: Self-pay | Admitting: Nurse Practitioner

## 2022-02-05 VITALS — BP 134/74 | HR 67 | Temp 98.1°F | Resp 16 | Ht 60.0 in | Wt 241.0 lb

## 2022-02-05 DIAGNOSIS — Z7409 Other reduced mobility: Secondary | ICD-10-CM | POA: Diagnosis not present

## 2022-02-05 DIAGNOSIS — Z433 Encounter for attention to colostomy: Secondary | ICD-10-CM | POA: Diagnosis not present

## 2022-02-05 DIAGNOSIS — R531 Weakness: Secondary | ICD-10-CM

## 2022-02-05 DIAGNOSIS — I10 Essential (primary) hypertension: Secondary | ICD-10-CM

## 2022-02-05 DIAGNOSIS — Z760 Encounter for issue of repeat prescription: Secondary | ICD-10-CM | POA: Diagnosis not present

## 2022-02-05 DIAGNOSIS — Z9181 History of falling: Secondary | ICD-10-CM | POA: Diagnosis not present

## 2022-02-05 DIAGNOSIS — S31829A Unspecified open wound of left buttock, initial encounter: Secondary | ICD-10-CM | POA: Diagnosis not present

## 2022-02-05 DIAGNOSIS — E039 Hypothyroidism, unspecified: Secondary | ICD-10-CM

## 2022-02-05 DIAGNOSIS — S31101A Unspecified open wound of abdominal wall, left upper quadrant without penetration into peritoneal cavity, initial encounter: Secondary | ICD-10-CM | POA: Diagnosis not present

## 2022-02-05 DIAGNOSIS — Z933 Colostomy status: Secondary | ICD-10-CM | POA: Diagnosis not present

## 2022-02-05 DIAGNOSIS — I5022 Chronic systolic (congestive) heart failure: Secondary | ICD-10-CM

## 2022-02-05 DIAGNOSIS — Z23 Encounter for immunization: Secondary | ICD-10-CM | POA: Diagnosis not present

## 2022-02-05 DIAGNOSIS — G8929 Other chronic pain: Secondary | ICD-10-CM

## 2022-02-05 MED ORDER — GABAPENTIN 300 MG PO CAPS: ORAL_CAPSULE | ORAL | 1 refills | Status: DC

## 2022-02-05 MED ORDER — POTASSIUM CHLORIDE CRYS ER 20 MEQ PO TBCR: 20.0000 meq | EXTENDED_RELEASE_TABLET | Freq: Every day | ORAL | 1 refills | Status: DC

## 2022-02-05 NOTE — Progress Notes (Signed)
[facility] [place], [place] [address]  Internal MEDICINE  Office Visit Note  Patient Name: Diana Bernard  [id number]  [id number]  Date of Service: 02/05/2022  Chief Complaint  Patient presents with   Follow-up   Depression   Hypertension   Gastroesophageal Reflux    HPI Diana Bernard presents for a follow up visit for hypertension, GERD, med refills Hypertension -- BP controlled GERD -- takes nexium, no longer taking famotidine Hypothyroidism -- sx controlled Generalized weakness, impaired balance and gait, cannot ambulate up or down stairs.      Current Medication: Outpatient Encounter Medications as of 02/05/2022  Medication Sig   acetaminophen (TYLENOL) 500 MG tablet Take 500 mg by mouth every 8 (eight) hours. Take 2 tablets by mouth every 8 hours.   albuterol (VENTOLIN HFA) 108 (90 Base) MCG/ACT inhaler Inhale 2 puffs into the lungs every 6 (six) hours as needed for wheezing or shortness of breath.   alendronate (FOSAMAX) 70 MG tablet Take 1 tablet (70 mg total) by mouth every 7 (seven) days. Take with a full glass of water on an empty stomach.   allopurinol (ZYLOPRIM) 100 MG tablet Take 1 tablet (100 mg total) by mouth daily.   cholecalciferol (VITAMIN D) 1000 units tablet Take 1,000 Units by mouth daily.   clotrimazole (LOTRIMIN) 1 % cream Apply 1 Application topically 2 (two) times daily. Apply topically 2x daily.   colchicine 0.6 MG tablet Take 1 tablet (0.6 mg total) by mouth daily as needed (gout flare).   Cyanocobalamin (VITAMIN B-12 PO) Take by mouth. Pt takes [place] gummies   diclofenac Sodium (VOLTAREN) 1 % GEL Apply topically 4 (four) times daily.   esomeprazole (NEXIUM) 20 MG capsule Take 20 mg by mouth daily at 12 noon.   fluticasone-salmeterol (ADVAIR) 250-50 MCG/ACT AEPB Inhale 1 puff into the lungs in the morning and at bedtime.   furosemide (LASIX) 20 MG tablet Take 1 tablet (20 mg total) by mouth daily.   gabapentin  (NEURONTIN) 300 MG capsule Take 1 cap in morning and 3 cap at bedtime   ipratropium-albuterol (DUONEB) 0.5-2.5 (3) MG/3ML SOLN Take 3 mLs by nebulization every 4 (four) hours as needed.   lidocaine 4 % Place 1 patch onto the skin daily.   lisinopril (ZESTRIL) 20 MG tablet Take 1 tablet (20 mg total) by mouth daily.   magnesium oxide (MAG-OX) 400 (240 Mg) MG tablet Take 400 mg by mouth 2 (two) times daily.   metoprolol succinate (TOPROL-XL) 50 MG 24 hr tablet Take 1 tablet (50 mg total) by mouth daily. Take with or immediately following a meal.   Multiple Vitamin (MULTIVITAMIN) capsule Take 1 capsule by mouth daily.   nitrofurantoin, macrocrystal-monohydrate, (MACROBID) 100 MG capsule Take 1 capsule (100 mg total) by mouth every Monday, Wednesday, and Friday.   rosuvastatin (CRESTOR) 10 MG tablet TAKE 1 TABLET TWICE WEEKLY   venlafaxine XR (EFFEXOR-XR) 37.5 MG 24 hr capsule TAKE 1 CAPSULE EVERY DAY   [DISCONTINUED] famotidine (PEPCID) 20 MG tablet Take 20 mg by mouth 2 (two) times daily.   [DISCONTINUED] melatonin 3 MG TABS tablet Take 3 mg by mouth at bedtime.   No facility-administered encounter medications on file as of 02/05/2022.    Surgical History: Past Surgical History:  Procedure Laterality Date   ABCESS DRAINAGE  2016   Abdominal abcess due to diverticulitis   caridoverter defibrillator  11/14/2017   ICD   CATARACT EXTRACTION W/PHACO Left 10/26/2015   Procedure: CATARACT EXTRACTION  PHACO AND INTRAOCULAR LENS PLACEMENT (IOC) LEFT EYE;  Surgeon: Leandrew Koyanagi, MD;  Location: [facility];  Service: Ophthalmology;  Laterality: Left;   CATARACT EXTRACTION W/PHACO Right 12/07/2015   Procedure: CATARACT EXTRACTION PHACO AND INTRAOCULAR LENS PLACEMENT (IOC);  Surgeon: Leandrew Koyanagi, MD;  Location: [facility];  Service: Ophthalmology;  Laterality: Right;   CHOLECYSTECTOMY     FOOT SURGERY     Per patient, bilateral foot surgery.   OSTOMY  01/25/2021   OSTOMY   02/11/2021   PELVIC FRACTURE SURGERY     Per patient for cancer.    Medical History: Past Medical History:  Diagnosis Date   Arthritis    Asthma    Cancer ([place])    Deep vein thrombosis (DVT) ([place]) 2015   Depression    GERD (gastroesophageal reflux disease)    Hypertension    Weakness of both legs     Family History: Family History  Problem Relation Age of Onset   Depression Sister    Heart disease Sister    Early death Sister    Depression Sister    Depression Sister    Depression Sister    Depression Sister     Social History   Socioeconomic History   Marital status: Married    Spouse name: Not on file   Number of children: Not on file   Years of education: Not on file   Highest education level: Not on file  Occupational History   Not on file  Tobacco Use   Smoking status: Never   Smokeless tobacco: Never  Vaping Use   Vaping Use: Never used  Substance and Sexual Activity   Alcohol use: No   Drug use: No   Sexual activity: Not Currently  Other Topics Concern   Not on file  Social History Narrative   Not on file   Social Determinants of Health   Financial Resource Strain: Low Risk  (08/03/2020)   Overall Financial Resource Strain (CARDIA)    Difficulty of Paying Living Expenses: Not very hard  Food Insecurity: Not on file  Transportation Needs: Not on file  Physical Activity: Not on file  Stress: Not on file  Social Connections: Not on file  Intimate Partner Violence: Not on file      Review of Systems  Constitutional:  Positive for activity change (activity and physical ability is improving with physical therapy.). Negative for appetite change, chills, fatigue, fever and unexpected weight change.  HENT: Negative.  Negative for congestion, ear pain, rhinorrhea, sore throat and trouble swallowing.   Eyes: Negative.   Respiratory: Negative.  Negative for cough, chest tightness, shortness of breath and wheezing.   Cardiovascular: Negative.   Negative for chest pain and palpitations.  Gastrointestinal: Negative.  Negative for abdominal pain, blood in stool, constipation, diarrhea, nausea and vomiting.  Endocrine: Negative.   Genitourinary: Negative.  Negative for difficulty urinating, dysuria, frequency, hematuria and urgency.  Musculoskeletal:  Positive for gait problem (currently has physical therapy visits, often uses wheel chair for ambulation but is able to walk 20 steps back and forth x3 with physical therapy. she is improving, several months ago she was bedridden). Negative for arthralgias, back pain, joint swelling, myalgias and neck pain.  Skin: Negative.  Negative for rash and wound.  Allergic/Immunologic: Negative.  Negative for immunocompromised state.  Neurological:  Negative for dizziness, seizures, numbness and headaches.  Hematological: Negative.   Psychiatric/Behavioral:  Negative for behavioral problems, self-injury and suicidal ideas. The patient is not  nervous/anxious.     Vital Signs: BP 134/74   Pulse 67   Temp 98.1 F (36.7 C)   Resp 16   Ht 5' (1.524 m)   Wt 241 lb (109.3 kg)   SpO2 96%   BMI 47.07 kg/m    Physical Exam Vitals reviewed.  Constitutional:      General: She is not in acute distress.    Appearance: Normal appearance. She is obese. She is not ill-appearing.  HENT:     Head: Normocephalic and atraumatic.  Eyes:     Pupils: Pupils are equal, round, and reactive to light.  Cardiovascular:     Rate and Rhythm: Normal rate and regular rhythm.  Pulmonary:     Effort: Pulmonary effort is normal. No respiratory distress.  Neurological:     Mental Status: She is alert and oriented to person, place, and time.  Psychiatric:        Mood and Affect: Mood normal.        Behavior: Behavior normal.        Assessment/Plan: 1. Generalized weakness Recommended to continue physical therapy, referral ordered - Ambulatory referral to Physical Therapy  2. Impaired functional mobility,  balance, gait, and endurance Needs to continue PT, referral ordered - Ambulatory referral to Physical Therapy  3. At high risk for falls Referred for continued PT - Ambulatory referral to Physical Therapy  4. Medication refill - gabapentin (NEURONTIN) 300 MG capsule; Take 1 capsule by mouth in morning, 1 capsule at midday and  3 capsule at bedtime  Dispense: 450 capsule; Refill: 1 - potassium chloride SA (KLOR-CON M) 20 MEQ tablet; Take 1 tablet (20 mEq total) by mouth daily.  Dispense: 90 tablet; Refill: 1  5. Needs flu shot Administered in office today - Flu Vaccine MDCK QUAD PF   General Counseling: Diana Bernard verbalizes understanding of the findings of todays visit and agrees with plan of treatment. I have discussed any further diagnostic evaluation that may be needed or ordered today. We also reviewed her medications today. she has been encouraged to call the office with any questions or concerns that should arise related to todays visit.    Orders Placed This Encounter  Procedures   Flu Vaccine MDCK QUAD PF    No orders of the defined types were placed in this encounter.   Return in about 3 months (around 05/08/2022) for F/U, Evon Dejarnett PCP.   Total time spent:30 Minutes Time spent includes review of chart, medications, test results, and follow up plan with the patient.   [place] Controlled Substance Database was reviewed by me.  This patient was seen by Jonetta Osgood, FNP-C in collaboration with Dr. Clayborn Bigness as a part of collaborative care agreement.   Cato Liburd R. Valetta Fuller, MSN, FNP-C Internal medicine

## 2022-02-08 DIAGNOSIS — D492 Neoplasm of unspecified behavior of bone, soft tissue, and skin: Secondary | ICD-10-CM | POA: Diagnosis not present

## 2022-02-08 DIAGNOSIS — C44329 Squamous cell carcinoma of skin of other parts of face: Secondary | ICD-10-CM | POA: Diagnosis not present

## 2022-02-15 DIAGNOSIS — L988 Other specified disorders of the skin and subcutaneous tissue: Secondary | ICD-10-CM | POA: Diagnosis not present

## 2022-02-15 DIAGNOSIS — L578 Other skin changes due to chronic exposure to nonionizing radiation: Secondary | ICD-10-CM | POA: Diagnosis not present

## 2022-02-15 DIAGNOSIS — C44329 Squamous cell carcinoma of skin of other parts of face: Secondary | ICD-10-CM | POA: Diagnosis not present

## 2022-02-15 DIAGNOSIS — L814 Other melanin hyperpigmentation: Secondary | ICD-10-CM | POA: Diagnosis not present

## 2022-02-21 ENCOUNTER — Telehealth: Payer: Self-pay

## 2022-02-21 ENCOUNTER — Other Ambulatory Visit: Payer: Self-pay | Admitting: Nurse Practitioner

## 2022-02-21 ENCOUNTER — Other Ambulatory Visit: Payer: Self-pay

## 2022-02-21 DIAGNOSIS — M10079 Idiopathic gout, unspecified ankle and foot: Secondary | ICD-10-CM

## 2022-02-21 MED ORDER — AZITHROMYCIN 250 MG PO TABS: 250.0000 mg | ORAL_TABLET | Freq: Every day | ORAL | 0 refills | Status: DC

## 2022-02-21 NOTE — Telephone Encounter (Signed)
Pt granddaughter  called that Covid test is negative coughing and low grade fever as per alyssa send Zithromax  tab po daily for 10 days and also she can take Mucinex and if she having shortness of  breath and symptoms getting worse need to go ED

## 2022-02-21 NOTE — Telephone Encounter (Signed)
Pt grand daughter called that she is coughing low grade fever advised her to do covid test then call us back

## 2022-02-28 ENCOUNTER — Other Ambulatory Visit: Payer: Self-pay | Admitting: Nurse Practitioner

## 2022-02-28 DIAGNOSIS — E119 Type 2 diabetes mellitus without complications: Secondary | ICD-10-CM | POA: Diagnosis not present

## 2022-02-28 DIAGNOSIS — H04123 Dry eye syndrome of bilateral lacrimal glands: Secondary | ICD-10-CM | POA: Diagnosis not present

## 2022-02-28 DIAGNOSIS — H5213 Myopia, bilateral: Secondary | ICD-10-CM | POA: Diagnosis not present

## 2022-02-28 DIAGNOSIS — H34211 Partial retinal artery occlusion, right eye: Secondary | ICD-10-CM | POA: Diagnosis not present

## 2022-02-28 DIAGNOSIS — I700 Atherosclerosis of aorta: Secondary | ICD-10-CM | POA: Insufficient documentation

## 2022-02-28 DIAGNOSIS — I6522 Occlusion and stenosis of left carotid artery: Secondary | ICD-10-CM | POA: Insufficient documentation

## 2022-03-01 ENCOUNTER — Encounter: Payer: Self-pay | Admitting: Nurse Practitioner

## 2022-03-05 DIAGNOSIS — Z933 Colostomy status: Secondary | ICD-10-CM | POA: Diagnosis not present

## 2022-03-05 DIAGNOSIS — S31101A Unspecified open wound of abdominal wall, left upper quadrant without penetration into peritoneal cavity, initial encounter: Secondary | ICD-10-CM | POA: Diagnosis not present

## 2022-03-05 DIAGNOSIS — S31829A Unspecified open wound of left buttock, initial encounter: Secondary | ICD-10-CM | POA: Diagnosis not present

## 2022-03-05 DIAGNOSIS — Z433 Encounter for attention to colostomy: Secondary | ICD-10-CM | POA: Diagnosis not present

## 2022-03-08 ENCOUNTER — Other Ambulatory Visit: Payer: Self-pay | Admitting: Nurse Practitioner

## 2022-03-08 DIAGNOSIS — M10079 Idiopathic gout, unspecified ankle and foot: Secondary | ICD-10-CM

## 2022-04-03 ENCOUNTER — Other Ambulatory Visit: Payer: Self-pay

## 2022-04-03 ENCOUNTER — Telehealth: Payer: Self-pay

## 2022-04-03 MED ORDER — PREDNISONE 10 MG PO TABS: ORAL_TABLET | ORAL | 0 refills | Status: DC

## 2022-04-03 MED ORDER — LEVOFLOXACIN 500 MG PO TABS: 500.0000 mg | ORAL_TABLET | Freq: Every day | ORAL | 0 refills | Status: DC

## 2022-04-03 NOTE — Telephone Encounter (Signed)
Pt called  that she is coughing clear mucus ,post nasal drip ,sinus infection and Covid test  is negative as per dr Humphrey Rolls advised her that we will send her Levaquin and prednisone if she is not feeling better need appt

## 2022-04-04 DIAGNOSIS — Z433 Encounter for attention to colostomy: Secondary | ICD-10-CM | POA: Diagnosis not present

## 2022-04-04 DIAGNOSIS — S31829A Unspecified open wound of left buttock, initial encounter: Secondary | ICD-10-CM | POA: Diagnosis not present

## 2022-04-04 DIAGNOSIS — S31101A Unspecified open wound of abdominal wall, left upper quadrant without penetration into peritoneal cavity, initial encounter: Secondary | ICD-10-CM | POA: Diagnosis not present

## 2022-04-04 DIAGNOSIS — Z933 Colostomy status: Secondary | ICD-10-CM | POA: Diagnosis not present

## 2022-04-07 DIAGNOSIS — Z4502 Encounter for adjustment and management of automatic implantable cardiac defibrillator: Secondary | ICD-10-CM | POA: Diagnosis not present

## 2022-04-07 DIAGNOSIS — Z95810 Presence of automatic (implantable) cardiac defibrillator: Secondary | ICD-10-CM | POA: Diagnosis not present

## 2022-04-13 DIAGNOSIS — Z6841 Body Mass Index (BMI) 40.0 and over, adult: Secondary | ICD-10-CM | POA: Diagnosis not present

## 2022-04-13 DIAGNOSIS — I5022 Chronic systolic (congestive) heart failure: Secondary | ICD-10-CM | POA: Diagnosis not present

## 2022-04-15 ENCOUNTER — Telehealth: Payer: Medicare HMO | Admitting: Family Medicine

## 2022-04-15 DIAGNOSIS — J209 Acute bronchitis, unspecified: Secondary | ICD-10-CM | POA: Diagnosis not present

## 2022-04-15 DIAGNOSIS — J440 Chronic obstructive pulmonary disease with acute lower respiratory infection: Secondary | ICD-10-CM

## 2022-04-15 MED ORDER — PREDNISONE 10 MG PO TABS: ORAL_TABLET | ORAL | 0 refills | Status: DC

## 2022-04-15 MED ORDER — BENZONATATE 200 MG PO CAPS: 200.0000 mg | ORAL_CAPSULE | Freq: Two times a day (BID) | ORAL | 0 refills | Status: DC | PRN

## 2022-04-15 NOTE — Progress Notes (Signed)
Virtual Visit Consent   Diana Bernard, you are scheduled for a virtual visit with a [facility] provider today. Just as with appointments in the office, your consent must be obtained to participate. Your consent will be active for this visit and any virtual visit you may have with one of our providers in the next 365 days. If you have a MyChart account, a copy of this consent can be sent to you electronically.  As this is a virtual visit, video technology does not allow for your provider to perform a traditional examination. This may limit your provider's ability to fully assess your condition. If your provider identifies any concerns that need to be evaluated in person or the need to arrange testing (such as labs, EKG, etc.), we will make arrangements to do so. Although advances in technology are sophisticated, we cannot ensure that it will always work on either your end or our end. If the connection with a video visit is poor, the visit may have to be switched to a telephone visit. With either a video or telephone visit, we are not always able to ensure that we have a secure connection.  By engaging in this virtual visit, you consent to the provision of healthcare and authorize for your insurance to be billed (if applicable) for the services provided during this visit. Depending on your insurance coverage, you may receive a charge related to this service.  I need to obtain your verbal consent now. Are you willing to proceed with your visit today? Diana Bernard has provided verbal consent on 04/15/2022 for a virtual visit (video or telephone). Dellia Nims, FNP  Date: 04/15/2022 10:36 AM  Virtual Visit via Video Note   I, Dellia Nims, connected with  Diana Bernard  ([id number], 26-Dec-1945) on 04/15/22 at 10:30 AM EST by a video-enabled telemedicine application and verified that I am speaking with the correct person using two identifiers.  Location: Patient: Virtual Visit Location  Patient: Home Provider: Virtual Visit Location Provider: Home Office   I discussed the limitations of evaluation and management by telemedicine and the availability of in person appointments. The patient expressed understanding and agreed to proceed.    History of Present Illness: Diana Bernard is a 76 y.o. who identifies as a female who was assigned female at birth, and is being seen today for cough, and wheezing. Completed levaquin 2 days ago. The cough and wheezing returned. Has apptmt with pcp Wednesday. Her daugher [name] is on the phone with her. Temp 99. Clear drainage from nose. Uses albuterol inhaler and neb. She is not diabetic. No history of pneumonia. [name]  HPI: HPI  Problems:  Patient Active Problem List   Diagnosis Date Noted   Left carotid artery stenosis 02/28/2022   Aortic atherosclerosis ([place]) 02/28/2022   Dyspnea on exertion 04/03/2020   Hx of malignant neoplasm of uterine body 11/03/2019   Left lower quadrant abdominal pain 10/23/2019   Age-related osteoporosis without current pathological fracture 10/23/2019   Encounter for screening mammogram for malignant neoplasm of breast 10/23/2019   Acquired hypothyroidism 08/02/2019   Other symptoms and signs involving the nervous system 04/06/2019   Memory loss 04/06/2019   Acute upper respiratory infection 01/27/2019   Exposure to COVID-19 virus 01/27/2019   Intermittent claudication ([place]) 11/01/2018   Moderate major depression ([place]) 09/22/2018   Peripheral arterial disease ([place]) 06/25/2018   Diverticulitis of large intestine without perforation or abscess without bleeding 05/29/2018   Other fatigue 03/20/2018  Dysuria 03/20/2018   Gastroenteritis, acute 01/26/2018   Diarrhea 01/26/2018   Nausea 37/01/6268   Chronic systolic heart failure ([place]) 01/26/2018   Cough variant asthma 12/07/2017   Need for vaccination against Streptococcus pneumoniae using pneumococcal conjugate vaccine 13 12/07/2017   AICD lead  displacement 11/20/2017   Cardiac resynchronization therapy defibrillator (CRT-D) in place 11/14/2017   Dilated cardiomyopathy ([place]) 09/04/2017   Left bundle branch block 09/04/2017   OSA on CPAP 07/31/2017   Abscess of female pelvis 05/07/2014   Acute deep vein thrombosis (DVT) of femoral vein of left lower extremity ([place]) 03/29/2014   Lumbago-sciatica due to displacement of lumbar intervertebral disc 12/28/2013   Chronic bilateral low back pain with bilateral sciatica 12/28/2013   Neuropathy due to chemotherapeutic drug ([place]) 11/02/2013   Encounter for general adult medical examination with abnormal findings 06/01/2013   Morbid obesity ([place]) 04/20/2013   Essential hypertension 04/13/2013   Malignant neoplasm of endometrium ([place]) 03/30/2013   Shortness of breath 03/30/2013   CMC arthritis, thumb, degenerative 01/02/2013    Allergies: No Known Allergies Medications:  Current Outpatient Medications:    acetaminophen (TYLENOL) 500 MG tablet, Take 500 mg by mouth every 8 (eight) hours. Take 2 tablets by mouth every 8 hours., Disp: , Rfl:    albuterol (VENTOLIN HFA) 108 (90 Base) MCG/ACT inhaler, Inhale 2 puffs into the lungs every 6 (six) hours as needed for wheezing or shortness of breath., Disp: 18 g, Rfl: 1   alendronate (FOSAMAX) 70 MG tablet, Take 1 tablet (70 mg total) by mouth every 7 (seven) days. Take with a full glass of water on an empty stomach., Disp: 12 tablet, Rfl: 3   allopurinol (ZYLOPRIM) 100 MG tablet, TAKE 1 TABLET EVERY DAY, Disp: 90 tablet, Rfl: 1   benzonatate (TESSALON) 200 MG capsule, Take 1 capsule (200 mg total) by mouth 2 (two) times daily as needed for cough., Disp: 20 capsule, Rfl: 0   cholecalciferol (VITAMIN D) 1000 units tablet, Take 1,000 Units by mouth daily., Disp: , Rfl:    clotrimazole (LOTRIMIN) 1 % cream, Apply 1 Application topically 2 (two) times daily. Apply topically 2x daily., Disp: , Rfl:    colchicine 0.6 MG tablet, TAKE 1 TABLET EVERY DAY AS  NEEDED FOR GOUT FLARE, Disp: 60 tablet, Rfl: 10   Cyanocobalamin (VITAMIN B-12 PO), Take by mouth. Pt takes [place] gummies, Disp: , Rfl:    diclofenac Sodium (VOLTAREN) 1 % GEL, Apply topically 4 (four) times daily., Disp: , Rfl:    esomeprazole (NEXIUM) 20 MG capsule, Take 20 mg by mouth daily at 12 noon., Disp: , Rfl:    fluticasone-salmeterol (ADVAIR) 250-50 MCG/ACT AEPB, Inhale 1 puff into the lungs in the morning and at bedtime., Disp: 180 each, Rfl: 1   furosemide (LASIX) 20 MG tablet, Take 1 tablet (20 mg total) by mouth daily., Disp: 90 tablet, Rfl: 3   gabapentin (NEURONTIN) 300 MG capsule, Take 1 capsule by mouth in morning, 1 capsule at midday and  3 capsule at bedtime, Disp: 450 capsule, Rfl: 1   ipratropium-albuterol (DUONEB) 0.5-2.5 (3) MG/3ML SOLN, Take 3 mLs by nebulization every 4 (four) hours as needed., Disp: 360 mL, Rfl: 1   levofloxacin (LEVAQUIN) 500 MG tablet, Take 1 tablet (500 mg total) by mouth daily., Disp: 10 tablet, Rfl: 0   lidocaine 4 %, Place 1 patch onto the skin daily., Disp: , Rfl:    lisinopril (ZESTRIL) 20 MG tablet, Take 1 tablet (20 mg total) by mouth  daily., Disp: 90 tablet, Rfl: 3   magnesium oxide (MAG-OX) 400 (240 Mg) MG tablet, Take 400 mg by mouth 2 (two) times daily., Disp: , Rfl:    metoprolol succinate (TOPROL-XL) 50 MG 24 hr tablet, Take 1 tablet (50 mg total) by mouth daily. Take with or immediately following a meal., Disp: 90 tablet, Rfl: 3   Multiple Vitamin (MULTIVITAMIN) capsule, Take 1 capsule by mouth daily., Disp: , Rfl:    nitrofurantoin, macrocrystal-monohydrate, (MACROBID) 100 MG capsule, Take 1 capsule (100 mg total) by mouth every Monday, Wednesday, and Friday., Disp: 36 capsule, Rfl: 2   potassium chloride SA (KLOR-CON M) 20 MEQ tablet, Take 1 tablet (20 mEq total) by mouth daily., Disp: 90 tablet, Rfl: 1   predniSONE (DELTASONE) 10 MG tablet, Take 1 tab po 3 x day for 3 days then take 1 tab po 2 x a day for 3 days and then take 1  tab po daily for 3 days, Disp: 18 tablet, Rfl: 0   rosuvastatin (CRESTOR) 10 MG tablet, TAKE 1 TABLET TWICE WEEKLY, Disp: 26 tablet, Rfl: 3   venlafaxine XR (EFFEXOR-XR) 37.5 MG 24 hr capsule, TAKE 1 CAPSULE EVERY DAY, Disp: 90 capsule, Rfl: 3  Observations/Objective: Patient is well-developed, well-nourished in no acute distress.  Resting comfortably  at home.  Head is normocephalic, atraumatic.  No labored breathing.  Speech is clear and coherent with logical content.  Patient is alert and oriented at baseline.    Assessment and Plan: 1. Acute bronchitis with COPD ([place])  Increase fluids, use nebs as ordered, med use and side effects per pharmacy, in no distress. Urgent care or ED if sx persist or worsen prior to apptmt Wednesday.   Follow Up Instructions: I discussed the assessment and treatment plan with the patient. The patient was provided an opportunity to ask questions and all were answered. The patient agreed with the plan and demonstrated an understanding of the instructions.  A copy of instructions were sent to the patient via MyChart unless otherwise noted below.     The patient was advised to call back or seek an in-person evaluation if the symptoms worsen or if the condition fails to improve as anticipated.  Time:  I spent 10 minutes with the patient via telehealth technology discussing the above problems/concerns.    Dellia Nims, FNP

## 2022-04-15 NOTE — Patient Instructions (Signed)
Acute Bronchitis, Adult  Acute bronchitis is sudden inflammation of the main airways (bronchi) that come off the windpipe (trachea) in the lungs. The swelling causes the airways to get smaller and make more mucus than normal. This can make it hard to breathe and can cause coughing or noisy breathing (wheezing). Acute bronchitis may last several weeks. The cough may last longer. Allergies, asthma, and exposure to smoke may make the condition worse. What are the causes? This condition can be caused by germs and by substances that irritate the lungs, including: Cold and flu viruses. The most common cause of this condition is the virus that causes the common cold. Bacteria. This is less common. Breathing in substances that irritate the lungs, including: Smoke from cigarettes and other forms of tobacco. Dust and pollen. Fumes from household cleaning products, gases, or burned fuel. Indoor or outdoor air pollution. What increases the risk? The following factors may make you more likely to develop this condition: A weak body's defense system, also called the immune system. A condition that affects your lungs and breathing, such as asthma. What are the signs or symptoms? Common symptoms of this condition include: Coughing. This may bring up clear, yellow, or green mucus from your lungs (sputum). Wheezing. Runny or stuffy nose. Having too much mucus in your lungs (chest congestion). Shortness of breath. Aches and pains, including sore throat or chest. How is this diagnosed? This condition is usually diagnosed based on: Your symptoms and medical history. A physical exam. You may also have other tests, including tests to rule out other conditions, such as pneumonia. These tests include: A test of lung function. Test of a mucus sample to look for the presence of bacteria. Tests to check the oxygen level in your blood. Blood tests. Chest X-ray. How is this treated? Most cases of acute  bronchitis clear up over time without treatment. Your health care provider may recommend: Drinking more fluids to help thin your mucus so it is easier to cough up. Taking inhaled medicine (inhaler) to improve air flow in and out of your lungs. Using a vaporizer or a humidifier. These are machines that add water to the air to help you breathe better. Taking a medicine that thins mucus and clears congestion (expectorant). Taking a medicine that prevents or stops coughing (cough suppressant). It is not common to take an antibiotic medicine for this condition. Follow these instructions at home:  Take over-the-counter and prescription medicines only as told by your health care provider. Use an inhaler, vaporizer, or humidifier as told by your health care provider. Take two teaspoons (10 mL) of honey at bedtime to lessen coughing at night. Drink enough fluid to keep your urine pale yellow. Do not use any products that contain nicotine or tobacco. These products include cigarettes, chewing tobacco, and vaping devices, such as e-cigarettes. If you need help quitting, ask your health care provider. Get plenty of rest. Return to your normal activities as told by your health care provider. Ask your health care provider what activities are safe for you. Keep all follow-up visits. This is important. How is this prevented? To lower your risk of getting this condition again: Wash your hands often with soap and water for at least 20 seconds. If soap and water are not available, use hand sanitizer. Avoid contact with people who have cold symptoms. Try not to touch your mouth, nose, or eyes with your hands. Avoid breathing in smoke or chemical fumes. Breathing smoke or chemical fumes will make   your condition worse. Get the flu shot every year. Contact a health care provider if: Your symptoms do not improve after 2 weeks. You have trouble coughing up the mucus. Your cough keeps you awake at night. You have  a fever. Get help right away if you: Cough up blood. Feel pain in your chest. Have severe shortness of breath. Faint or keep feeling like you are going to faint. Have a severe headache. Have a fever or chills that get worse. These symptoms may represent a serious problem that is an emergency. Do not wait to see if the symptoms will go away. Get medical help right away. Call your local emergency services (911 in the [place] Do not drive yourself to the hospital. Summary Acute bronchitis is inflammation of the main airways (bronchi) that come off the windpipe (trachea) in the lungs. The swelling causes the airways to get smaller and make more mucus than normal. Drinking more fluids can help thin your mucus so it is easier to cough up. Take over-the-counter and prescription medicines only as told by your health care provider. Do not use any products that contain nicotine or tobacco. These products include cigarettes, chewing tobacco, and vaping devices, such as e-cigarettes. If you need help quitting, ask your health care provider. Contact a health care provider if your symptoms do not improve after 2 weeks. This information is not intended to replace advice given to you by your health care provider. Make sure you discuss any questions you have with your health care provider. Document Revised: 07/27/2021 Document Reviewed: 08/17/2020 Elsevier Patient Education  [place]. Chronic Obstructive Pulmonary Disease  Chronic obstructive pulmonary disease (COPD) is a long-term (chronic) condition that affects the lungs. COPD is a general term that can be used to describe many different lung problems that cause lung inflammation and limit airflow, including chronic bronchitis and emphysema. If you have COPD, your lung function will probably never return to normal. In most cases, it gets worse over time. However, there are steps you can take to slow the progression of the disease and improve your  quality of life. What are the causes? This condition may be caused by: Smoking. This is the most common cause. Certain genes passed down through families. What increases the risk? The following factors may make you more likely to develop this condition: Being exposed to secondhand smoke from cigarettes, pipes, or cigars. Being exposed to chemicals and other irritants, such as fumes and dust in the work environment. Having chronic lung conditions or infections. What are the signs or symptoms? Symptoms of this condition include: Shortness of breath, especially during physical activity. Chronic cough with a large amount of thick mucus. Sometimes, the cough may not have any mucus (dry cough). Wheezing and rapid breathing. Gray or bluish discoloration (cyanosis) of the skin, especially in the fingers, toes, or lips. Feeling tired (fatigue). Weight loss. Chest tightness. Frequent infections. Episodes when breathing symptoms become much worse (exacerbations). At the later stages of this disease, you may have swelling in the ankles, feet, or legs. How is this diagnosed? This condition is diagnosed based on: Your medical history. A physical exam. You may also have tests, including: Lung (pulmonary) function tests. This may include a spirometry test, which measures your ability to exhale properly. Chest X-ray. CT scan. Blood tests. How is this treated? This condition may be treated with: Medicines. These may include inhaled rescue medicines to treat acute exacerbations as well as medicines that you take long-term (maintenance medicines)  to prevent flare-ups of COPD. Bronchodilators help treat COPD by dilating the airways to allow increased airflow and make your breathing more comfortable. Steroids can reduce airway inflammation and help prevent exacerbations. Smoking cessation. If you smoke, your health care provider may ask you to quit, and may also recommend therapy or replacement  products to help you quit. Pulmonary rehabilitation. This may involve working with a team of health care providers and specialists, such as respiratory, occupational, and physical therapists. Exercise and physical activity. These are beneficial for nearly all people with COPD. Nutrition therapy to gain weight, if you are underweight. Oxygen. Supplemental oxygen therapy is only helpful if you have a low oxygen level in your blood (hypoxemia). Lung surgery or transplant. Palliative care. This is to help people with COPD feel comfortable when treatment is no longer working. Follow these instructions at home: Medicines Take over-the-counter and prescription medicines only as told by your health care provider. This includes inhaled medicines and pills. Talk to your health care provider before taking any cough or allergy medicines. You may need to avoid certain medicines that dry out your airways. Lifestyle If you smoke, the most important thing that you can do is to stop smoking. Continuing to smoke will cause the disease to progress faster. Do not use any products that contain nicotine or tobacco. These products include cigarettes, chewing tobacco, and vaping devices, such as e-cigarettes. If you need help quitting, ask your health care provider. Avoid exposure to things that irritate your lungs, such as smoke, chemicals, and fumes. Stay active, but balance activity with periods of rest. Exercise and physical activity will help you maintain your ability to do things you want to do. Learn and use relaxation techniques to manage stress and to control your breathing. Get the right amount of sleep and get quality sleep. Most adults need 7 or more hours per night. Eat healthy foods. Eating smaller, more frequent meals and resting before meals may help you maintain your strength. Controlled breathing Learn and use controlled breathing techniques as directed by your health care provider. Controlled  breathing techniques include: Pursed lip breathing. Start by breathing in (inhaling) through your nose for 1 second. Then, purse your lips as if you were going to whistle and breathe out (exhale) through the pursed lips for 2 seconds. Diaphragmatic breathing. Start by putting one hand on your abdomen just above your waist. Inhale slowly through your nose. The hand on your abdomen should move out. Then purse your lips and exhale slowly. You should be able to feel the hand on your abdomen moving in as you exhale.  Controlled coughing Learn and use controlled coughing to clear mucus from your lungs. Controlled coughing is a series of short, progressive coughs. The steps of controlled coughing are: Lean your head slightly forward. Breathe in deeply using diaphragmatic breathing. Try to hold your breath for 3 seconds. Keep your mouth slightly open while coughing twice. Spit any mucus out into a tissue. Rest and repeat the steps once or twice as needed. General instructions Make sure you receive all the vaccines that your health care provider recommends, especially the pneumococcal and influenza vaccines. Preventing infection and hospitalization is very important when you have COPD. Drink enough fluid to keep your urine pale yellow, unless you have a medical condition that requires fluid restriction. Use oxygen therapy and pulmonary rehabilitation if told by your health care provider. If you require home oxygen therapy, ask your health care provider whether you should purchase a pulse  oximeter to measure your oxygen level at home. Work with your health care provider to develop a COPD action plan. This will help you know what steps to take if your condition gets worse. Keep other chronic health conditions under control as told by your health care provider. Avoid extreme temperature and humidity changes. Avoid contact with people who have an illness that spreads from person to person (is contagious), such  as viral infections or pneumonia. Keep all follow-up visits. This is important. Contact a health care provider if: You are coughing up more mucus than usual. There is a change in the color or thickness of your mucus. Your breathing is more labored than usual. Your breathing is faster than usual. You have difficulty sleeping. You need to use your rescue medicines or inhalers more often than expected. You have trouble doing routine activities such as getting dressed or walking around the house. Get help right away if: You have shortness of breath while you are resting. You have shortness of breath that prevents you from: Being able to talk. Performing your usual physical activities. You have chest pain lasting longer than 5 minutes. Your skin color is more blue (cyanotic) than usual. You measure low oxygen saturations for longer than 5 minutes with a pulse oximeter. You have a fever. You feel too tired to breathe normally. These symptoms may represent a serious problem that is an emergency. Do not wait to see if the symptoms will go away. Get medical help right away. Call your local emergency services (911 in the [place] Do not drive yourself to the hospital. Summary Chronic obstructive pulmonary disease (COPD) is a long-term (chronic) condition that affects the lungs. Your lung function will probably never return to normal. In most cases, it gets worse over time. However, there are steps you can take to slow the progression of the disease and improve your quality of life. Treatment for COPD may include taking medicines, quitting smoking, pulmonary rehabilitation, and changes to diet and exercise. As the disease progresses, you may need oxygen therapy, a lung transplant, or palliative care. To help manage your condition, do not smoke, avoid exposure to things that irritate your lungs, stay up to date on all vaccines, and follow your health care provider's instructions for taking  medicines. This information is not intended to replace advice given to you by your health care provider. Make sure you discuss any questions you have with your health care provider. Document Revised: 02/23/2020 Document Reviewed: 02/23/2020 Elsevier Patient Education  [place].

## 2022-04-18 ENCOUNTER — Ambulatory Visit
Admission: RE | Admit: 2022-04-18 | Discharge: 2022-04-18 | Disposition: A | Discharge status: Home / Self Care | Payer: Medicare HMO | Source: Ambulatory Visit | Attending: Nurse Practitioner | Admitting: Nurse Practitioner

## 2022-04-18 ENCOUNTER — Ambulatory Visit (INDEPENDENT_AMBULATORY_CARE_PROVIDER_SITE_OTHER): Payer: Medicare HMO | Admitting: Nurse Practitioner

## 2022-04-18 ENCOUNTER — Ambulatory Visit
Admission: RE | Admit: 2022-04-18 | Discharge: 2022-04-18 | Disposition: A | Discharge status: Home / Self Care | Payer: Medicare HMO | Attending: Nurse Practitioner | Admitting: Nurse Practitioner

## 2022-04-18 ENCOUNTER — Encounter: Payer: Self-pay | Admitting: Nurse Practitioner

## 2022-04-18 VITALS — BP 154/79 | HR 64 | Temp 98.3°F | Resp 16 | Ht 60.0 in | Wt 243.0 lb

## 2022-04-18 DIAGNOSIS — R058 Other specified cough: Secondary | ICD-10-CM

## 2022-04-18 DIAGNOSIS — R051 Acute cough: Secondary | ICD-10-CM | POA: Insufficient documentation

## 2022-04-18 DIAGNOSIS — R0989 Other specified symptoms and signs involving the circulatory and respiratory systems: Secondary | ICD-10-CM | POA: Insufficient documentation

## 2022-04-18 DIAGNOSIS — R52 Pain, unspecified: Secondary | ICD-10-CM | POA: Diagnosis not present

## 2022-04-18 DIAGNOSIS — R059 Cough, unspecified: Secondary | ICD-10-CM | POA: Diagnosis not present

## 2022-04-18 MED ORDER — TRAMADOL HCL 50 MG PO TABS: 50.0000 mg | ORAL_TABLET | Freq: Four times a day (QID) | ORAL | 0 refills | Status: DC | PRN

## 2022-04-18 MED ORDER — AMOXICILLIN-POT CLAVULANATE 875-125 MG PO TABS: 1.0000 | ORAL_TABLET | Freq: Two times a day (BID) | ORAL | 0 refills | Status: DC

## 2022-04-18 NOTE — Progress Notes (Signed)
[facility] [address], [place] [address]  Internal MEDICINE  Office Visit Note  Patient Name: Diana Bernard  [id number]  [id number]  Date of Service: 04/18/2022  Chief Complaint  Patient presents with   Back Pain    Possible pneumonia, cough is causing back pain     HPI Diana Bernard presents for an acute sick visit for back pain with severe cough.  --about 5-7 days  Cough with back pain Fatigue      Current Medication:  Outpatient Encounter Medications as of 04/18/2022  Medication Sig   acetaminophen (TYLENOL) 500 MG tablet Take 500 mg by mouth every 8 (eight) hours. Take 2 tablets by mouth every 8 hours.   albuterol (VENTOLIN HFA) 108 (90 Base) MCG/ACT inhaler Inhale 2 puffs into the lungs every 6 (six) hours as needed for wheezing or shortness of breath.   alendronate (FOSAMAX) 70 MG tablet Take 1 tablet (70 mg total) by mouth every 7 (seven) days. Take with a full glass of water on an empty stomach.   allopurinol (ZYLOPRIM) 100 MG tablet TAKE 1 TABLET EVERY DAY   amoxicillin-clavulanate (AUGMENTIN) 875-125 MG tablet Take 1 tablet by mouth 2 (two) times daily.   benzonatate (TESSALON) 200 MG capsule Take 1 capsule (200 mg total) by mouth 2 (two) times daily as needed for cough.   cholecalciferol (VITAMIN D) 1000 units tablet Take 1,000 Units by mouth daily.   clotrimazole (LOTRIMIN) 1 % cream Apply 1 Application topically 2 (two) times daily. Apply topically 2x daily.   colchicine 0.6 MG tablet TAKE 1 TABLET EVERY DAY AS NEEDED FOR GOUT FLARE   Cyanocobalamin (VITAMIN B-12 PO) Take by mouth. Pt takes [place] gummies   diclofenac Sodium (VOLTAREN) 1 % GEL Apply topically 4 (four) times daily.   esomeprazole (NEXIUM) 20 MG capsule Take 20 mg by mouth daily at 12 noon.   fluticasone-salmeterol (ADVAIR) 250-50 MCG/ACT AEPB Inhale 1 puff into the lungs in the morning and at bedtime.   furosemide (LASIX) 20 MG tablet Take 1 tablet (20 mg total) by  mouth daily.   gabapentin (NEURONTIN) 300 MG capsule Take 1 capsule by mouth in morning, 1 capsule at midday and  3 capsule at bedtime   ipratropium-albuterol (DUONEB) 0.5-2.5 (3) MG/3ML SOLN Take 3 mLs by nebulization every 4 (four) hours as needed.   levofloxacin (LEVAQUIN) 500 MG tablet Take 1 tablet (500 mg total) by mouth daily.   lidocaine 4 % Place 1 patch onto the skin daily.   lisinopril (ZESTRIL) 20 MG tablet Take 1 tablet (20 mg total) by mouth daily.   magnesium oxide (MAG-OX) 400 (240 Mg) MG tablet Take 400 mg by mouth 2 (two) times daily.   metoprolol succinate (TOPROL-XL) 50 MG 24 hr tablet Take 1 tablet (50 mg total) by mouth daily. Take with or immediately following a meal.   Multiple Vitamin (MULTIVITAMIN) capsule Take 1 capsule by mouth daily.   nitrofurantoin, macrocrystal-monohydrate, (MACROBID) 100 MG capsule Take 1 capsule (100 mg total) by mouth every Monday, Wednesday, and Friday.   potassium chloride SA (KLOR-CON M) 20 MEQ tablet Take 1 tablet (20 mEq total) by mouth daily.   predniSONE (DELTASONE) 10 MG tablet Take 1 tab po 3 x day for 3 days then take 1 tab po 2 x a day for 3 days and then take 1 tab po daily for 3 days   rosuvastatin (CRESTOR) 10 MG tablet TAKE 1 TABLET TWICE WEEKLY   traMADol (ULTRAM) 50  MG tablet Take 1 tablet (50 mg total) by mouth every 6 (six) hours as needed for up to 5 days for moderate pain or severe pain.   venlafaxine XR (EFFEXOR-XR) 37.5 MG 24 hr capsule TAKE 1 CAPSULE EVERY DAY   No facility-administered encounter medications on file as of 04/18/2022.      Medical History: Past Medical History:  Diagnosis Date   Arthritis    Asthma    Cancer ([place])    Deep vein thrombosis (DVT) ([place]) 2015   Depression    GERD (gastroesophageal reflux disease)    Hypertension    Weakness of both legs      Vital Signs: BP (!) 154/79   Pulse 64   Temp 98.3 F (36.8 C)   Resp 16   Ht 5' (1.524 m)   Wt 243 lb (110.2 kg)   SpO2 98%   BMI  47.46 kg/m    Review of Systems  Constitutional:  Positive for fatigue and fever.  HENT:  Positive for congestion and postnasal drip.   Respiratory:  Positive for cough, chest tightness and shortness of breath. Negative for wheezing.   Cardiovascular: Negative.  Negative for chest pain and palpitations.  Neurological:  Positive for headaches.    Physical Exam Cardiovascular:     Heart sounds: Normal heart sounds. No murmur heard. Pulmonary:     Effort: Pulmonary effort is normal. No tachypnea, accessory muscle usage or respiratory distress.     Breath sounds: Normal air entry. Examination of the right-upper field reveals rales. Examination of the left-upper field reveals rales. Examination of the right-middle field reveals decreased breath sounds. Examination of the left-middle field reveals decreased breath sounds. Examination of the right-lower field reveals decreased breath sounds. Examination of the left-lower field reveals decreased breath sounds. Decreased breath sounds and rales present.       Assessment/Plan: 1. Acute cough Chest xray and antibiotic ordered - DG Chest 2 View; Future - amoxicillin-clavulanate (AUGMENTIN) 875-125 MG tablet; Take 1 tablet by mouth 2 (two) times daily.  Dispense: 20 tablet; Refill: 0  2. Scattered respiratory crackles Empiric treatment of possible bacterial bronchitis vs pneumonia - DG Chest 2 View; Future - amoxicillin-clavulanate (AUGMENTIN) 875-125 MG tablet; Take 1 tablet by mouth 2 (two) times daily.  Dispense: 20 tablet; Refill: 0  3. Painful cough Small amount of tramadol ordered to help alleviate moderate to severe pain.  - traMADol (ULTRAM) 50 MG tablet; Take 1 tablet (50 mg total) by mouth every 6 (six) hours as needed for up to 5 days for moderate pain or severe pain.  Dispense: 20 tablet; Refill: 0   General Counseling: Diana Bernard verbalizes understanding of the findings of todays visit and agrees with plan of treatment. I have  discussed any further diagnostic evaluation that may be needed or ordered today. We also reviewed her medications today. she has been encouraged to call the office with any questions or concerns that should arise related to todays visit.    Counseling:    Orders Placed This Encounter  Procedures   DG Chest 2 View    Meds ordered this encounter  Medications   traMADol (ULTRAM) 50 MG tablet    Sig: Take 1 tablet (50 mg total) by mouth every 6 (six) hours as needed for up to 5 days for moderate pain or severe pain.    Dispense:  20 tablet    Refill:  0   amoxicillin-clavulanate (AUGMENTIN) 875-125 MG tablet    Sig: Take 1 tablet  by mouth 2 (two) times daily.    Dispense:  20 tablet    Refill:  0    Return if symptoms worsen or fail to improve.  [place] Controlled Substance Database was reviewed by me for overdose risk score (ORS)  Time spent:20 Minutes Time spent with patient included reviewing progress notes, labs, imaging studies, and discussing plan for follow up.   This patient was seen by Jonetta Osgood, FNP-C in collaboration with Dr. Clayborn Bigness as a part of collaborative care agreement.  Waino Mounsey R. Valetta Fuller, MSN, FNP-C Internal Medicine

## 2022-04-25 ENCOUNTER — Other Ambulatory Visit: Payer: Self-pay

## 2022-04-25 ENCOUNTER — Telehealth: Payer: Self-pay

## 2022-04-25 DIAGNOSIS — M810 Age-related osteoporosis without current pathological fracture: Secondary | ICD-10-CM

## 2022-04-25 MED ORDER — ALENDRONATE SODIUM 70 MG PO TABS: 70.0000 mg | ORAL_TABLET | ORAL | 3 refills | Status: DC

## 2022-04-25 NOTE — Telephone Encounter (Addendum)
Alendronate '70mg'$  given from [facility]

## 2022-04-25 NOTE — Telephone Encounter (Signed)
As per alyssa send med

## 2022-04-30 ENCOUNTER — Encounter: Payer: Self-pay | Admitting: Nurse Practitioner

## 2022-04-30 DIAGNOSIS — R7303 Prediabetes: Secondary | ICD-10-CM

## 2022-04-30 DIAGNOSIS — E039 Hypothyroidism, unspecified: Secondary | ICD-10-CM

## 2022-05-01 ENCOUNTER — Other Ambulatory Visit: Payer: Self-pay | Admitting: Nurse Practitioner

## 2022-05-01 DIAGNOSIS — E782 Mixed hyperlipidemia: Secondary | ICD-10-CM

## 2022-05-07 ENCOUNTER — Ambulatory Visit: Payer: Medicare HMO | Admitting: Nurse Practitioner

## 2022-05-07 DIAGNOSIS — Z433 Encounter for attention to colostomy: Secondary | ICD-10-CM | POA: Diagnosis not present

## 2022-05-07 DIAGNOSIS — Z933 Colostomy status: Secondary | ICD-10-CM | POA: Diagnosis not present

## 2022-05-07 DIAGNOSIS — S31829A Unspecified open wound of left buttock, initial encounter: Secondary | ICD-10-CM | POA: Diagnosis not present

## 2022-05-07 DIAGNOSIS — S31101A Unspecified open wound of abdominal wall, left upper quadrant without penetration into peritoneal cavity, initial encounter: Secondary | ICD-10-CM | POA: Diagnosis not present

## 2022-05-07 MED ORDER — DULAGLUTIDE 0.75 MG/0.5ML ~~LOC~~ SOAJ: 0.7500 mg | SUBCUTANEOUS | 0 refills | Status: DC

## 2022-05-09 ENCOUNTER — Encounter: Payer: Self-pay | Admitting: Nurse Practitioner

## 2022-05-09 ENCOUNTER — Telehealth: Payer: Self-pay | Admitting: Nurse Practitioner

## 2022-05-09 ENCOUNTER — Ambulatory Visit (INDEPENDENT_AMBULATORY_CARE_PROVIDER_SITE_OTHER): Payer: Medicare HMO | Admitting: Nurse Practitioner

## 2022-05-09 VITALS — BP 137/60 | HR 73 | Temp 97.8°F | Resp 16 | Ht 60.0 in | Wt 250.0 lb

## 2022-05-09 DIAGNOSIS — M545 Low back pain, unspecified: Secondary | ICD-10-CM | POA: Diagnosis not present

## 2022-05-09 DIAGNOSIS — J3089 Other allergic rhinitis: Secondary | ICD-10-CM | POA: Diagnosis not present

## 2022-05-09 DIAGNOSIS — Z9181 History of falling: Secondary | ICD-10-CM

## 2022-05-09 DIAGNOSIS — R531 Weakness: Secondary | ICD-10-CM | POA: Diagnosis not present

## 2022-05-09 DIAGNOSIS — Z7409 Other reduced mobility: Secondary | ICD-10-CM

## 2022-05-09 DIAGNOSIS — Z760 Encounter for issue of repeat prescription: Secondary | ICD-10-CM | POA: Diagnosis not present

## 2022-05-09 MED ORDER — TRAMADOL HCL 50 MG PO TABS: 50.0000 mg | ORAL_TABLET | Freq: Four times a day (QID) | ORAL | 0 refills | Status: AC | PRN

## 2022-05-09 MED ORDER — GABAPENTIN 300 MG PO CAPS: ORAL_CAPSULE | ORAL | 1 refills | Status: DC

## 2022-05-09 MED ORDER — LORATADINE 10 MG PO TABS: 10.0000 mg | ORAL_TABLET | Freq: Every day | ORAL | 3 refills | Status: DC

## 2022-05-09 NOTE — Telephone Encounter (Signed)
Physical Therapy referral faxed to [facility]; [phone]

## 2022-05-09 NOTE — Progress Notes (Signed)
[facility] [place], [place] [address]  Internal MEDICINE  Office Visit Note  Patient Name: Diana Bernard  [id number]  [id number]  Date of Service: 05/09/2022  Chief Complaint  Patient presents with   Follow-up   Hypertension   Depression    HPI Diana Bernard presents for a follow-up visit for hypertension, physical therapy and med refills Sinus infection is resolved.  Walking with walker but still having generalized weakness, impaired mobility, and is at risk for falls. Physical therapy was reordered in October last year but the patient never heard from them to schedule appt. Would still like to restart physical therapy.  Hypertension -- BP stable with current medications. Need refill of gabapentin, current dose effective, and tramadol.  Allergic rhinitis -- started taking claritin, wants to have generic prescribed and sent to [facility] to see if insurance will cover it.     Current Medication: Outpatient Encounter Medications as of 05/09/2022  Medication Sig   acetaminophen (TYLENOL) 500 MG tablet Take 500 mg by mouth every 8 (eight) hours. Take 2 tablets by mouth every 8 hours.   albuterol (VENTOLIN HFA) 108 (90 Base) MCG/ACT inhaler Inhale 2 puffs into the lungs every 6 (six) hours as needed for wheezing or shortness of breath.   alendronate (FOSAMAX) 70 MG tablet Take 1 tablet (70 mg total) by mouth every 7 (seven) days. Take with a full glass of water on an empty stomach.   allopurinol (ZYLOPRIM) 100 MG tablet TAKE 1 TABLET EVERY DAY   cholecalciferol (VITAMIN D) 1000 units tablet Take 1,000 Units by mouth daily.   clotrimazole (LOTRIMIN) 1 % cream Apply 1 Application topically 2 (two) times daily. Apply topically 2x daily.   colchicine 0.6 MG tablet TAKE 1 TABLET EVERY DAY AS NEEDED FOR GOUT FLARE   Cyanocobalamin (VITAMIN B-12 PO) Take by mouth. Pt takes [facility] [place] gummies   diclofenac Sodium (VOLTAREN) 1 % GEL Apply topically 4 (four) times  daily.   Dulaglutide 0.75 MG/0.5ML SOPN Inject 0.75 mg into the skin once a week.   esomeprazole (NEXIUM) 20 MG capsule Take 20 mg by mouth daily at 12 noon.   fluticasone-salmeterol (ADVAIR) 250-50 MCG/ACT AEPB Inhale 1 puff into the lungs in the morning and at bedtime.   furosemide (LASIX) 20 MG tablet Take 1 tablet (20 mg total) by mouth daily.   ipratropium-albuterol (DUONEB) 0.5-2.5 (3) MG/3ML SOLN Take 3 mLs by nebulization every 4 (four) hours as needed.   levofloxacin (LEVAQUIN) 500 MG tablet Take 1 tablet (500 mg total) by mouth daily.   lidocaine 4 % Place 1 patch onto the skin daily.   lisinopril (ZESTRIL) 20 MG tablet Take 1 tablet (20 mg total) by mouth daily.   loratadine (CLARITIN) 10 MG tablet Take 1 tablet (10 mg total) by mouth daily.   magnesium oxide (MAG-OX) 400 (240 Mg) MG tablet Take 400 mg by mouth 2 (two) times daily.   metoprolol succinate (TOPROL-XL) 50 MG 24 hr tablet Take 1 tablet (50 mg total) by mouth daily. Take with or immediately following a meal.   Multiple Vitamin (MULTIVITAMIN) capsule Take 1 capsule by mouth daily.   nitrofurantoin, macrocrystal-monohydrate, (MACROBID) 100 MG capsule Take 1 capsule (100 mg total) by mouth every Monday, Wednesday, and Friday.   potassium chloride SA (KLOR-CON M) 20 MEQ tablet Take 1 tablet (20 mEq total) by mouth daily.   predniSONE (DELTASONE) 10 MG tablet Take 1 tab po 3 x day for 3 days then take 1  tab po 2 x a day for 3 days and then take 1 tab po daily for 3 days   rosuvastatin (CRESTOR) 10 MG tablet TAKE 1 TABLET TWICE WEEKLY   venlafaxine XR (EFFEXOR-XR) 37.5 MG 24 hr capsule TAKE 1 CAPSULE EVERY DAY   [DISCONTINUED] amoxicillin-clavulanate (AUGMENTIN) 875-125 MG tablet Take 1 tablet by mouth 2 (two) times daily.   [DISCONTINUED] benzonatate (TESSALON) 200 MG capsule Take 1 capsule (200 mg total) by mouth 2 (two) times daily as needed for cough.   [DISCONTINUED] gabapentin (NEURONTIN) 300 MG capsule Take 1 capsule by  mouth in morning, 1 capsule at midday and  3 capsule at bedtime   gabapentin (NEURONTIN) 300 MG capsule Take 1 capsule by mouth in morning, 1 capsule at midday and  3 capsule at bedtime   traMADol (ULTRAM) 50 MG tablet Take 1 tablet (50 mg total) by mouth every 6 (six) hours as needed for up to 5 days for moderate pain or severe pain.   No facility-administered encounter medications on file as of 05/09/2022.    Surgical History: Past Surgical History:  Procedure Laterality Date   ABCESS DRAINAGE  2016   Abdominal abcess due to diverticulitis   caridoverter defibrillator  11/14/2017   ICD   CATARACT EXTRACTION W/PHACO Left 10/26/2015   Procedure: CATARACT EXTRACTION PHACO AND INTRAOCULAR LENS PLACEMENT (IOC) LEFT EYE;  Surgeon: Leandrew Koyanagi, MD;  Location: [place];  Service: Ophthalmology;  Laterality: Left;   CATARACT EXTRACTION W/PHACO Right 12/07/2015   Procedure: CATARACT EXTRACTION PHACO AND INTRAOCULAR LENS PLACEMENT (IOC);  Surgeon: Leandrew Koyanagi, MD;  Location: [place];  Service: Ophthalmology;  Laterality: Right;   CHOLECYSTECTOMY     FOOT SURGERY     Per patient, bilateral foot surgery.   OSTOMY  01/25/2021   OSTOMY  02/11/2021   PELVIC FRACTURE SURGERY     Per patient for cancer.    Medical History: Past Medical History:  Diagnosis Date   Arthritis    Asthma    Cancer ([place])    Deep vein thrombosis (DVT) ([place]) 2015   Depression    GERD (gastroesophageal reflux disease)    Hypertension    Weakness of both legs     Family History: Family History  Problem Relation Age of Onset   Depression Sister    Heart disease Sister    Early death Sister    Depression Sister    Depression Sister    Depression Sister    Depression Sister     Social History   Socioeconomic History   Marital status: Married    Spouse name: Not on file   Number of children: Not on file   Years of education: Not on file   Highest education level: Not  on file  Occupational History   Not on file  Tobacco Use   Smoking status: Never   Smokeless tobacco: Never  Vaping Use   Vaping Use: Never used  Substance and Sexual Activity   Alcohol use: No   Drug use: No   Sexual activity: Not Currently  Other Topics Concern   Not on file  Social History Narrative   Not on file   Social Determinants of Health   Financial Resource Strain: Low Risk  (08/03/2020)   Overall Financial Resource Strain (CARDIA)    Difficulty of Paying Living Expenses: Not very hard  Food Insecurity: Not on file  Transportation Needs: Not on file  Physical Activity: Not on file  Stress: Not on  file  Social Connections: Not on file  Intimate Partner Violence: Not on file      Review of Systems  Constitutional:  Positive for activity change (activity and physical ability is improving with physical therapy.). Negative for appetite change, chills, fatigue, fever and unexpected weight change.  HENT: Negative.  Negative for congestion, ear pain, rhinorrhea, sore throat and trouble swallowing.   Eyes: Negative.   Respiratory: Negative.  Negative for cough, chest tightness, shortness of breath and wheezing.   Cardiovascular: Negative.  Negative for chest pain and palpitations.  Gastrointestinal: Negative.  Negative for abdominal pain, blood in stool, constipation, diarrhea, nausea and vomiting.  Endocrine: Negative.   Genitourinary: Negative.  Negative for difficulty urinating, dysuria, frequency, hematuria and urgency.  Musculoskeletal:  Positive for gait problem (currently has physical therapy visits, often uses wheel chair for ambulation but is able to walk 20 steps back and forth x3 with physical therapy. she is improving, several months ago she was bedridden). Negative for arthralgias, back pain, joint swelling, myalgias and neck pain.  Skin: Negative.  Negative for rash and wound.  Allergic/Immunologic: Negative.  Negative for immunocompromised state.   Neurological:  Negative for dizziness, seizures, numbness and headaches.  Hematological: Negative.   Psychiatric/Behavioral:  Negative for behavioral problems, self-injury and suicidal ideas. The patient is not nervous/anxious.     Vital Signs: BP 137/60   Pulse 73   Temp 97.8 F (36.6 C)   Resp 16   Ht 5' (1.524 m)   Wt 250 lb (113.4 kg)   SpO2 97%   BMI 48.82 kg/m    Physical Exam Vitals reviewed.  Constitutional:      General: She is not in acute distress.    Appearance: Normal appearance. She is obese. She is not ill-appearing.  HENT:     Head: Normocephalic and atraumatic.  Eyes:     Pupils: Pupils are equal, round, and reactive to light.  Cardiovascular:     Rate and Rhythm: Normal rate and regular rhythm.  Pulmonary:     Effort: Pulmonary effort is normal. No respiratory distress.  Neurological:     Mental Status: She is alert and oriented to person, place, and time.  Psychiatric:        Mood and Affect: Mood normal.        Behavior: Behavior normal.        Assessment/Plan: 1. Generalized weakness Referred to physical therapy.  - Ambulatory referral to Physical Therapy  2. Acute midline low back pain without sciatica Tramadol refill ordered, continue as prescribed  - traMADol (ULTRAM) 50 MG tablet; Take 1 tablet (50 mg total) by mouth every 6 (six) hours as needed for up to 5 days for moderate pain or severe pain.  Dispense: 20 tablet; Refill: 0  3. Non-seasonal allergic rhinitis due to other allergic trigger Loratadine prescribed. May continue at 10 mg daily - loratadine (CLARITIN) 10 MG tablet; Take 1 tablet (10 mg total) by mouth daily.  Dispense: 90 tablet; Refill: 3  4. Impaired functional mobility, balance, gait, and endurance Referred to physical therapy - Ambulatory referral to Physical Therapy  5. At high risk for falls Referred to physical therapy - Ambulatory referral to Physical Therapy  6. Medication refill - gabapentin (NEURONTIN)  300 MG capsule; Take 1 capsule by mouth in morning, 1 capsule at midday and  3 capsule at bedtime  Dispense: 450 capsule; Refill: 1   General Counseling: Diana Bernard verbalizes understanding of the findings of todays visit and agrees  with plan of treatment. I have discussed any further diagnostic evaluation that may be needed or ordered today. We also reviewed her medications today. she has been encouraged to call the office with any questions or concerns that should arise related to todays visit.    Orders Placed This Encounter  Procedures   Ambulatory referral to Physical Therapy    Meds ordered this encounter  Medications   gabapentin (NEURONTIN) 300 MG capsule    Sig: Take 1 capsule by mouth in morning, 1 capsule at midday and  3 capsule at bedtime    Dispense:  450 capsule    Refill:  1    For future refills   loratadine (CLARITIN) 10 MG tablet    Sig: Take 1 tablet (10 mg total) by mouth daily.    Dispense:  90 tablet    Refill:  3   traMADol (ULTRAM) 50 MG tablet    Sig: Take 1 tablet (50 mg total) by mouth every 6 (six) hours as needed for up to 5 days for moderate pain or severe pain.    Dispense:  20 tablet    Refill:  0    Return in about 3 months (around 08/08/2022) for F/U, Diana Bernard PCP.   Total time spent:30 Minutes Time spent includes review of chart, medications, test results, and follow up plan with the patient.   [place] Controlled Substance Database was reviewed by me.  This patient was seen by Jonetta Osgood, FNP-C in collaboration with Dr. Clayborn Bigness as a part of collaborative care agreement.   Maclean Foister R. Valetta Fuller, MSN, FNP-C Internal medicine

## 2022-06-06 DIAGNOSIS — S31101A Unspecified open wound of abdominal wall, left upper quadrant without penetration into peritoneal cavity, initial encounter: Secondary | ICD-10-CM | POA: Diagnosis not present

## 2022-06-06 DIAGNOSIS — Z433 Encounter for attention to colostomy: Secondary | ICD-10-CM | POA: Diagnosis not present

## 2022-06-06 DIAGNOSIS — S31829A Unspecified open wound of left buttock, initial encounter: Secondary | ICD-10-CM | POA: Diagnosis not present

## 2022-06-06 DIAGNOSIS — Z933 Colostomy status: Secondary | ICD-10-CM | POA: Diagnosis not present

## 2022-06-08 ENCOUNTER — Telehealth: Payer: Self-pay

## 2022-06-08 MED ORDER — PREDNISONE 10 MG PO TABS: ORAL_TABLET | ORAL | 0 refills | Status: DC

## 2022-06-08 MED ORDER — AZITHROMYCIN 250 MG PO TABS: 250.0000 mg | ORAL_TABLET | Freq: Every day | ORAL | 0 refills | Status: AC

## 2022-06-08 NOTE — Telephone Encounter (Signed)
Pt advised we send med if she is not better need appt

## 2022-06-21 ENCOUNTER — Telehealth: Payer: Self-pay | Admitting: Nurse Practitioner

## 2022-06-22 MED ORDER — ONDANSETRON 4 MG PO TBDP: 4.0000 mg | ORAL_TABLET | Freq: Three times a day (TID) | ORAL | 0 refills | Status: AC | PRN

## 2022-06-22 NOTE — Telephone Encounter (Signed)
Pt notified that we send med

## 2022-06-25 ENCOUNTER — Ambulatory Visit (INDEPENDENT_AMBULATORY_CARE_PROVIDER_SITE_OTHER): Payer: Medicare HMO | Admitting: Nurse Practitioner

## 2022-06-25 ENCOUNTER — Encounter: Payer: Self-pay | Admitting: Nurse Practitioner

## 2022-06-25 VITALS — BP 118/67 | HR 88 | Temp 98.2°F | Resp 16 | Ht 60.0 in | Wt 244.6 lb

## 2022-06-25 DIAGNOSIS — J208 Acute bronchitis due to other specified organisms: Secondary | ICD-10-CM | POA: Diagnosis not present

## 2022-06-25 DIAGNOSIS — R531 Weakness: Secondary | ICD-10-CM

## 2022-06-25 DIAGNOSIS — B9689 Other specified bacterial agents as the cause of diseases classified elsewhere: Secondary | ICD-10-CM

## 2022-06-25 MED ORDER — BENZONATATE 200 MG PO CAPS: 200.0000 mg | ORAL_CAPSULE | Freq: Two times a day (BID) | ORAL | 0 refills | Status: DC | PRN

## 2022-06-25 MED ORDER — PREDNISONE 10 MG PO TABS: ORAL_TABLET | ORAL | 0 refills | Status: DC

## 2022-06-25 MED ORDER — LEVOFLOXACIN 750 MG PO TABS: 750.0000 mg | ORAL_TABLET | Freq: Every day | ORAL | 0 refills | Status: AC

## 2022-06-25 NOTE — Progress Notes (Signed)
[facility], [place] [address]  Internal MEDICINE  Office Visit Note  Patient Name: Diana Bernard  [id number]  [id number]  Date of Service: 06/25/2022  Chief Complaint  Patient presents with   Acute Visit     HPI Chara presents for an acute sick visit for fatigue, weakness and nausea.  --coughing and sneezing, sore throat, headache, wheezing, chest tightness, SOB Initial symptoms started around 2/9 -- took zpak and prednisone and felt better initially but then symptoms got worse again on 2/22-- got zofran for nausea which helps --symptoms still present with minimal to no improvement.     Current Medication:  Outpatient Encounter Medications as of 06/25/2022  Medication Sig   acetaminophen (TYLENOL) 500 MG tablet Take 500 mg by mouth every 8 (eight) hours. Take 2 tablets by mouth every 8 hours.   albuterol (VENTOLIN HFA) 108 (90 Base) MCG/ACT inhaler Inhale 2 puffs into the lungs every 6 (six) hours as needed for wheezing or shortness of breath.   alendronate (FOSAMAX) 70 MG tablet Take 1 tablet (70 mg total) by mouth every 7 (seven) days. Take with a full glass of water on an empty stomach.   allopurinol (ZYLOPRIM) 100 MG tablet TAKE 1 TABLET EVERY DAY   benzonatate (TESSALON) 200 MG capsule Take 1 capsule (200 mg total) by mouth 2 (two) times daily as needed for cough.   cholecalciferol (VITAMIN D) 1000 units tablet Take 1,000 Units by mouth daily.   clotrimazole (LOTRIMIN) 1 % cream Apply 1 Application topically 2 (two) times daily. Apply topically 2x daily.   colchicine 0.6 MG tablet TAKE 1 TABLET EVERY DAY AS NEEDED FOR GOUT FLARE   Cyanocobalamin (VITAMIN B-12 PO) Take by mouth. Pt takes [facility] gummies   diclofenac Sodium (VOLTAREN) 1 % GEL Apply topically 4 (four) times daily.   Dulaglutide 0.75 MG/0.5ML SOPN Inject 0.75 mg into the skin once a week.   esomeprazole (NEXIUM) 20 MG capsule Take 20 mg by mouth daily at 12 noon.    fluticasone-salmeterol (ADVAIR) 250-50 MCG/ACT AEPB Inhale 1 puff into the lungs in the morning and at bedtime.   furosemide (LASIX) 20 MG tablet Take 1 tablet (20 mg total) by mouth daily.   gabapentin (NEURONTIN) 300 MG capsule Take 1 capsule by mouth in morning, 1 capsule at midday and  3 capsule at bedtime   ipratropium-albuterol (DUONEB) 0.5-2.5 (3) MG/3ML SOLN Take 3 mLs by nebulization every 4 (four) hours as needed.   levofloxacin (LEVAQUIN) 750 MG tablet Take 1 tablet (750 mg total) by mouth daily for 7 days.   lidocaine 4 % Place 1 patch onto the skin daily.   lisinopril (ZESTRIL) 20 MG tablet Take 1 tablet (20 mg total) by mouth daily.   loratadine (CLARITIN) 10 MG tablet Take 1 tablet (10 mg total) by mouth daily.   magnesium oxide (MAG-OX) 400 (240 Mg) MG tablet Take 400 mg by mouth 2 (two) times daily.   metoprolol succinate (TOPROL-XL) 50 MG 24 hr tablet Take 1 tablet (50 mg total) by mouth daily. Take with or immediately following a meal.   Multiple Vitamin (MULTIVITAMIN) capsule Take 1 capsule by mouth daily.   nitrofurantoin, macrocrystal-monohydrate, (MACROBID) 100 MG capsule Take 1 capsule (100 mg total) by mouth every Monday, Wednesday, and Friday.   ondansetron (ZOFRAN-ODT) 4 MG disintegrating tablet Take 1 tablet (4 mg total) by mouth every 8 (eight) hours as needed for nausea or vomiting.   potassium chloride SA (KLOR-CON  M) 20 MEQ tablet Take 1 tablet (20 mEq total) by mouth daily.   rosuvastatin (CRESTOR) 10 MG tablet TAKE 1 TABLET TWICE WEEKLY   venlafaxine XR (EFFEXOR-XR) 37.5 MG 24 hr capsule TAKE 1 CAPSULE EVERY DAY   [DISCONTINUED] predniSONE (DELTASONE) 10 MG tablet Take 1 tab po 3 x day for 3 days then take 1 tab po 2 x a day for 3 days and then take 1 tab po daily for 3 days   predniSONE (DELTASONE) 10 MG tablet Take 1 tab po 3 x day for 3 days then take 1 tab po 2 x a day for 3 days and then take 1 tab po daily for 3 days   No facility-administered encounter  medications on file as of 06/25/2022.      Medical History: Past Medical History:  Diagnosis Date   Arthritis    Asthma    Cancer ([place])    Deep vein thrombosis (DVT) ([place]) 2015   Depression    GERD (gastroesophageal reflux disease)    Hypertension    Weakness of both legs      Vital Signs: BP 118/67   Pulse 88   Temp 98.2 F (36.8 C)   Resp 16   Ht 5' (1.524 m)   Wt 244 lb 9.6 oz (110.9 kg)   SpO2 92%   BMI 47.77 kg/m    Review of Systems  Constitutional:  Positive for fatigue.  HENT:  Positive for congestion, postnasal drip, rhinorrhea, sinus pressure, sinus pain, sneezing and sore throat. Negative for ear pain.   Respiratory:  Positive for cough, chest tightness, shortness of breath and wheezing.   Cardiovascular: Negative.  Negative for chest pain and palpitations.  Gastrointestinal:  Positive for nausea and vomiting. Negative for abdominal pain, constipation and diarrhea.  Neurological:  Positive for headaches.    Physical Exam Vitals reviewed.  Constitutional:      General: She is not in acute distress.    Appearance: Normal appearance. She is obese. She is ill-appearing.  HENT:     Head: Normocephalic and atraumatic.  Eyes:     Pupils: Pupils are equal, round, and reactive to light.  Cardiovascular:     Rate and Rhythm: Normal rate and regular rhythm.  Pulmonary:     Effort: Pulmonary effort is normal. No respiratory distress.  Neurological:     Mental Status: She is alert and oriented to person, place, and time.  Psychiatric:        Mood and Affect: Mood normal.        Behavior: Behavior normal.       Assessment/Plan: 1. Acute bronchitis, bacterial Empiric antibiotic prescribed. Prednisone taper prescribed.  Medication prescribed for cough PFT ordered, patient reports getting bronchitis yearly and chronically, is worried that her breathing has been affected long term.  - levofloxacin (LEVAQUIN) 750 MG tablet; Take 1 tablet (750 mg total) by  mouth daily for 7 days.  Dispense: 7 tablet; Refill: 0 - predniSONE (DELTASONE) 10 MG tablet; Take 1 tab po 3 x day for 3 days then take 1 tab po 2 x a day for 3 days and then take 1 tab po daily for 3 days  Dispense: 18 tablet; Refill: 0 - benzonatate (TESSALON) 200 MG capsule; Take 1 capsule (200 mg total) by mouth 2 (two) times daily as needed for cough.  Dispense: 30 capsule; Refill: 0 - Pulmonary function test; Future  2. Generalized weakness Prednisone taper prescribed.  - predniSONE (DELTASONE) 10 MG tablet; Take  1 tab po 3 x day for 3 days then take 1 tab po 2 x a day for 3 days and then take 1 tab po daily for 3 days  Dispense: 18 tablet; Refill: 0   General Counseling: Adrena verbalizes understanding of the findings of todays visit and agrees with plan of treatment. I have discussed any further diagnostic evaluation that may be needed or ordered today. We also reviewed her medications today. she has been encouraged to call the office with any questions or concerns that should arise related to todays visit.    Counseling:    Orders Placed This Encounter  Procedures   Pulmonary function test    Meds ordered this encounter  Medications   levofloxacin (LEVAQUIN) 750 MG tablet    Sig: Take 1 tablet (750 mg total) by mouth daily for 7 days.    Dispense:  7 tablet    Refill:  0    Fill today   predniSONE (DELTASONE) 10 MG tablet    Sig: Take 1 tab po 3 x day for 3 days then take 1 tab po 2 x a day for 3 days and then take 1 tab po daily for 3 days    Dispense:  18 tablet    Refill:  0    Fill today   benzonatate (TESSALON) 200 MG capsule    Sig: Take 1 capsule (200 mg total) by mouth 2 (two) times daily as needed for cough.    Dispense:  30 capsule    Refill:  0    Fill today    Return for need appt for PFT and then appt with DSK please for bronchitis/COPD.  [place] Controlled Substance Database was reviewed by me for overdose risk score (ORS)  Time spent:30 Minutes Time  spent with patient included reviewing progress notes, labs, imaging studies, and discussing plan for follow up.   This patient was seen by Jonetta Osgood, FNP-C in collaboration with Dr. Clayborn Bigness as a part of collaborative care agreement.  Regana Kemple R. Valetta Fuller, MSN, FNP-C Internal Medicine

## 2022-06-27 ENCOUNTER — Other Ambulatory Visit: Payer: Self-pay | Admitting: Nurse Practitioner

## 2022-06-27 DIAGNOSIS — N390 Urinary tract infection, site not specified: Secondary | ICD-10-CM

## 2022-06-27 DIAGNOSIS — Z760 Encounter for issue of repeat prescription: Secondary | ICD-10-CM

## 2022-06-27 DIAGNOSIS — H903 Sensorineural hearing loss, bilateral: Secondary | ICD-10-CM | POA: Diagnosis not present

## 2022-07-04 ENCOUNTER — Ambulatory Visit: Payer: Medicare HMO | Admitting: Internal Medicine

## 2022-07-04 DIAGNOSIS — B9689 Other specified bacterial agents as the cause of diseases classified elsewhere: Secondary | ICD-10-CM

## 2022-07-04 DIAGNOSIS — J208 Acute bronchitis due to other specified organisms: Secondary | ICD-10-CM | POA: Diagnosis not present

## 2022-07-07 ENCOUNTER — Encounter: Payer: Self-pay | Admitting: Nurse Practitioner

## 2022-07-07 DIAGNOSIS — Z95810 Presence of automatic (implantable) cardiac defibrillator: Secondary | ICD-10-CM | POA: Diagnosis not present

## 2022-07-17 ENCOUNTER — Ambulatory Visit: Payer: Medicare HMO | Admitting: Internal Medicine

## 2022-07-17 ENCOUNTER — Encounter: Payer: Self-pay | Admitting: Internal Medicine

## 2022-07-17 VITALS — BP 162/78 | HR 80 | Temp 98.4°F | Resp 16 | Ht 60.0 in | Wt 250.0 lb

## 2022-07-17 DIAGNOSIS — B9689 Other specified bacterial agents as the cause of diseases classified elsewhere: Secondary | ICD-10-CM | POA: Diagnosis not present

## 2022-07-17 DIAGNOSIS — J3089 Other allergic rhinitis: Secondary | ICD-10-CM

## 2022-07-17 DIAGNOSIS — J208 Acute bronchitis due to other specified organisms: Secondary | ICD-10-CM | POA: Diagnosis not present

## 2022-07-17 DIAGNOSIS — J4520 Mild intermittent asthma, uncomplicated: Secondary | ICD-10-CM | POA: Diagnosis not present

## 2022-07-17 MED ORDER — AMOXICILLIN-POT CLAVULANATE 875-125 MG PO TABS: 1.0000 | ORAL_TABLET | Freq: Two times a day (BID) | ORAL | 0 refills | Status: DC

## 2022-07-17 MED ORDER — MONTELUKAST SODIUM 10 MG PO TABS: 10.0000 mg | ORAL_TABLET | Freq: Every day | ORAL | 3 refills | Status: DC

## 2022-07-17 NOTE — Progress Notes (Unsigned)
[facility] [place], [place] [address]  Pulmonary Sleep Medicine   Office Visit Note  Patient Name: Diana Bernard DOB: [birth year] MRN [id number]  Date of Service: 07/17/2022  Complaints/HPI: She had a sinus infection back in January. She took abx got better. She states now symptoms are back despite abx and steroids. She states she has sputum which is yellow in color. She states no blood. She no chest pain or tightness. No fevers noted. She has not had a xray. She states she has been having headaches. She states that she has spnal issues. She feels weak. She is on inhalers currently advair. She also states she is also using nebs once a day  ROS  General: (-) fever, (-) chills, (-) night sweats, (-) weakness Skin: (-) rashes, (-) itching,. Eyes: (-) visual changes, (-) redness, (-) itching. Nose and Sinuses: (-) nasal stuffiness or itchiness, (-) postnasal drip, (-) nosebleeds, (-) sinus trouble. Mouth and Throat: (-) sore throat, (-) hoarseness. Neck: (-) swollen glands, (-) enlarged thyroid, (-) neck pain. Respiratory: + cough, (-) bloody sputum, + shortness of breath, + wheezing. Cardiovascular: - ankle swelling, (-) chest pain. Lymphatic: (-) lymph node enlargement. Neurologic: (-) numbness, (-) tingling. Psychiatric: (-) anxiety, (-) depression   Current Medication: Outpatient Encounter Medications as of 07/17/2022  Medication Sig   acetaminophen (TYLENOL) 500 MG tablet Take 500 mg by mouth every 8 (eight) hours. Take 2 tablets by mouth every 8 hours.   albuterol (VENTOLIN HFA) 108 (90 Base) MCG/ACT inhaler Inhale 2 puffs into the lungs every 6 (six) hours as needed for wheezing or shortness of breath.   alendronate (FOSAMAX) 70 MG tablet Take 1 tablet (70 mg total) by mouth every 7 (seven) days. Take with a full glass of water on an empty stomach.   allopurinol (ZYLOPRIM) 100 MG tablet TAKE 1 TABLET EVERY DAY   benzonatate (TESSALON) 200 MG  capsule Take 1 capsule (200 mg total) by mouth 2 (two) times daily as needed for cough.   cholecalciferol (VITAMIN D) 1000 units tablet Take 1,000 Units by mouth daily.   clotrimazole (LOTRIMIN) 1 % cream Apply 1 Application topically 2 (two) times daily. Apply topically 2x daily.   colchicine 0.6 MG tablet TAKE 1 TABLET EVERY DAY AS NEEDED FOR GOUT FLARE   Cyanocobalamin (VITAMIN B-12 PO) Take by mouth. Pt takes [place] gummies   diclofenac Sodium (VOLTAREN) 1 % GEL Apply topically 4 (four) times daily.   Dulaglutide 0.75 MG/0.5ML SOPN Inject 0.75 mg into the skin once a week.   esomeprazole (NEXIUM) 20 MG capsule Take 20 mg by mouth daily at 12 noon.   fluticasone-salmeterol (ADVAIR) 250-50 MCG/ACT AEPB Inhale 1 puff into the lungs in the morning and at bedtime.   furosemide (LASIX) 20 MG tablet Take 1 tablet (20 mg total) by mouth daily.   gabapentin (NEURONTIN) 300 MG capsule Take 1 capsule by mouth in morning, 1 capsule at midday and  3 capsule at bedtime   ipratropium-albuterol (DUONEB) 0.5-2.5 (3) MG/3ML SOLN Take 3 mLs by nebulization every 4 (four) hours as needed.   lidocaine 4 % Place 1 patch onto the skin daily.   lisinopril (ZESTRIL) 20 MG tablet Take 1 tablet (20 mg total) by mouth daily.   loratadine (CLARITIN) 10 MG tablet Take 1 tablet (10 mg total) by mouth daily.   magnesium oxide (MAG-OX) 400 (240 Mg) MG tablet Take 400 mg by mouth 2 (two) times daily.   metoprolol succinate (  TOPROL-XL) 50 MG 24 hr tablet Take 1 tablet (50 mg total) by mouth daily. Take with or immediately following a meal.   Multiple Vitamin (MULTIVITAMIN) capsule Take 1 capsule by mouth daily.   nitrofurantoin, macrocrystal-monohydrate, (MACROBID) 100 MG capsule TAKE 1 CAPSULE EVERY MONDAY, WEDNESDAY, AND FRIDAY   ondansetron (ZOFRAN-ODT) 4 MG disintegrating tablet Take 1 tablet (4 mg total) by mouth every 8 (eight) hours as needed for nausea or vomiting.   potassium chloride SA (KLOR-CON M) 20 MEQ  tablet TAKE 1 TABLET EVERY DAY   predniSONE (DELTASONE) 10 MG tablet Take 1 tab po 3 x day for 3 days then take 1 tab po 2 x a day for 3 days and then take 1 tab po daily for 3 days   rosuvastatin (CRESTOR) 10 MG tablet TAKE 1 TABLET TWICE WEEKLY   venlafaxine XR (EFFEXOR-XR) 37.5 MG 24 hr capsule TAKE 1 CAPSULE EVERY DAY   [EXPIRED] levofloxacin (LEVAQUIN) 750 MG tablet Take 1 tablet (750 mg total) by mouth daily for 7 days.   [DISCONTINUED] nitrofurantoin, macrocrystal-monohydrate, (MACROBID) 100 MG capsule Take 1 capsule (100 mg total) by mouth every Monday, Wednesday, and Friday.   [DISCONTINUED] potassium chloride SA (KLOR-CON M) 20 MEQ tablet Take 1 tablet (20 mEq total) by mouth daily.   No facility-administered encounter medications on file as of 07/17/2022.    Surgical History: Past Surgical History:  Procedure Laterality Date   ABCESS DRAINAGE  2016   Abdominal abcess due to diverticulitis   caridoverter defibrillator  11/14/2017   ICD   CATARACT EXTRACTION W/PHACO Left 10/26/2015   Procedure: CATARACT EXTRACTION PHACO AND INTRAOCULAR LENS PLACEMENT (IOC) LEFT EYE;  Surgeon: Leandrew Koyanagi, MD;  Location: [facility];  Service: Ophthalmology;  Laterality: Left;   CATARACT EXTRACTION W/PHACO Right 12/07/2015   Procedure: CATARACT EXTRACTION PHACO AND INTRAOCULAR LENS PLACEMENT (IOC);  Surgeon: Leandrew Koyanagi, MD;  Location: [facility];  Service: Ophthalmology;  Laterality: Right;   CHOLECYSTECTOMY     FOOT SURGERY     Per patient, bilateral foot surgery.   OSTOMY  01/25/2021   OSTOMY  02/11/2021   PELVIC FRACTURE SURGERY     Per patient for cancer.    Medical History: Past Medical History:  Diagnosis Date   Arthritis    Asthma    Cancer ([place])    Deep vein thrombosis (DVT) ([place]) 2015   Depression    GERD (gastroesophageal reflux disease)    Hypertension    Weakness of both legs     Family History: Family History  Problem Relation Age of  Onset   Depression Sister    Heart disease Sister    Early death Sister    Depression Sister    Depression Sister    Depression Sister    Depression Sister     Social History: Social History   Socioeconomic History   Marital status: Married    Spouse name: Not on file   Number of children: Not on file   Years of education: Not on file   Highest education level: Not on file  Occupational History   Not on file  Tobacco Use   Smoking status: Never   Smokeless tobacco: Never  Vaping Use   Vaping Use: Never used  Substance and Sexual Activity   Alcohol use: No   Drug use: No   Sexual activity: Not Currently  Other Topics Concern   Not on file  Social History Narrative   Not on file  Social Determinants of Health   Financial Resource Strain: Low Risk  (08/03/2020)   Overall Financial Resource Strain (CARDIA)    Difficulty of Paying Living Expenses: Not very hard  Food Insecurity: Not on file  Transportation Needs: Not on file  Physical Activity: Not on file  Stress: Not on file  Social Connections: Not on file  Intimate Partner Violence: Not on file    Vital Signs: Blood pressure (!) 162/78, pulse 80, temperature 98.4 F (36.9 C), resp. rate 16, height 5' (1.524 m), weight 250 lb (113.4 kg), SpO2 93 %.  Examination: General Appearance: The patient is well-developed, well-nourished, and in no distress. Skin: Gross inspection of skin unremarkable. Head: normocephalic, no gross deformities. Eyes: no gross deformities noted. ENT: ears appear grossly normal no exudates. Neck: Supple. No thyromegaly. No LAD. Respiratory: no rhonchi noted. Cardiovascular: Normal S1 and S2 without murmur or rub. Extremities: No cyanosis. pulses are equal. Neurologic: Alert and oriented. No involuntary movements.  LABS: No results found for this or any previous visit (from the past 2160 hour(s)).  Radiology: DG Chest 2 View  Result Date: 04/20/2022 CLINICAL DATA:  Cough and  crackles EXAM: CHEST - 2 VIEW COMPARISON:  March 09, 2020 FINDINGS: The heart size and mediastinal contours are stable. Both lungs are clear. Lung volumes are low. The visualized skeletal structures are stable. IMPRESSION: No active cardiopulmonary disease. Electronically Signed   By: Abelardo Diesel M.D.   On: 04/20/2022 14:14    No results found.  No results found.    Assessment and Plan: Patient Active Problem List   Diagnosis Date Noted   Left carotid artery stenosis 02/28/2022   Aortic atherosclerosis ([place]) 02/28/2022   Dyspnea on exertion 04/03/2020   Hx of malignant neoplasm of uterine body 11/03/2019   Left lower quadrant abdominal pain 10/23/2019   Age-related osteoporosis without current pathological fracture 10/23/2019   Encounter for screening mammogram for malignant neoplasm of breast 10/23/2019   Acquired hypothyroidism 08/02/2019   Other symptoms and signs involving the nervous system 04/06/2019   Memory loss 04/06/2019   Acute upper respiratory infection 01/27/2019   Exposure to COVID-19 virus 01/27/2019   Intermittent claudication ([place]) 11/01/2018   Moderate major depression ([place]) 09/22/2018   Peripheral arterial disease ([place]) 06/25/2018   Diverticulitis of large intestine without perforation or abscess without bleeding 05/29/2018   Other fatigue 03/20/2018   Dysuria 03/20/2018   Gastroenteritis, acute 01/26/2018   Diarrhea 01/26/2018   Nausea [id number]   Chronic systolic heart failure ([place]) 01/26/2018   Cough variant asthma 12/07/2017   Need for vaccination against Streptococcus pneumoniae using pneumococcal conjugate vaccine 13 12/07/2017   AICD lead displacement 11/20/2017   Cardiac resynchronization therapy defibrillator (CRT-D) in place 11/14/2017   Dilated cardiomyopathy ([place]) 09/04/2017   Left bundle branch block 09/04/2017   OSA on CPAP 07/31/2017   Abscess of female pelvis 05/07/2014   Acute deep vein thrombosis (DVT) of femoral vein of left lower  extremity ([place]) 03/29/2014   Lumbago-sciatica due to displacement of lumbar intervertebral disc 12/28/2013   Chronic bilateral low back pain with bilateral sciatica 12/28/2013   Neuropathy due to chemotherapeutic drug ([place]) 11/02/2013   Encounter for general adult medical examination with abnormal findings 06/01/2013   Morbid obesity ([place]) 04/20/2013   Essential hypertension 04/13/2013   Malignant neoplasm of endometrium ([place]) 03/30/2013   Shortness of breath 03/30/2013   CMC arthritis, thumb, degenerative 01/02/2013    1. Acute bronchitis, bacterial *** - amoxicillin-clavulanate (AUGMENTIN) 875-125 MG tablet;  Take 1 tablet by mouth 2 (two) times daily.  Dispense: 20 tablet; Refill: 0  2. Non-seasonal allergic rhinitis due to other allergic trigger ***  3. Morbid obesity ([facility]) ***  4. Chronic asthma, mild intermittent, uncomplicated Sample of breztri given - montelukast (SINGULAIR) 10 MG tablet; Take 1 tablet (10 mg total) by mouth at bedtime.  Dispense: 30 tablet; Refill: 3   General Counseling: I have discussed the findings of the evaluation and examination with Carlin.  I have also discussed any further diagnostic evaluation thatmay be needed or ordered today. Lynzy verbalizes understanding of the findings of todays visit. We also reviewed her medications today and discussed drug interactions and side effects including but not limited excessive drowsiness and altered mental states. We also discussed that there is always a risk not just to her but also people around her. she has been encouraged to call the office with any questions or concerns that should arise related to todays visit.  No orders of the defined types were placed in this encounter.    Time spent: ***  I have personally obtained a history, examined the patient, evaluated laboratory and imaging results, formulated the assessment and plan and placed orders.    Allyne Gee, MD [facility] Pulmonary and Critical  Care Sleep medicine

## 2022-07-23 NOTE — Procedures (Signed)
[facility] [address] [place], [address]    Complete Pulmonary Function Testing Interpretation:  FINDINGS:  The forced vital capacity is mildly decreased.  FEV1 FVC ratio is normal.  FEV1 is 1.28 L which is 70% of predicted and is mildly decreased.  Postbronchodilator there is no significant change in FEV1.  Total lung capacity is normal.  Residual volume is normal.  Residual in total in place ratio is increased.  FRC is decreased.  DLCO is within normal limits.  IMPRESSION: This pulmonary function study is suggestive of mild obstructive lung disease  Allyne Gee, MD [facility] Pulmonary Critical Care Medicine Sleep Medicine

## 2022-07-26 ENCOUNTER — Other Ambulatory Visit: Payer: Self-pay

## 2022-07-26 DIAGNOSIS — R7303 Prediabetes: Secondary | ICD-10-CM

## 2022-07-26 LAB — PULMONARY FUNCTION TEST

## 2022-07-26 MED ORDER — DULAGLUTIDE 0.75 MG/0.5ML ~~LOC~~ SOAJ: 0.7500 mg | SUBCUTANEOUS | 0 refills | Status: DC

## 2022-08-08 ENCOUNTER — Ambulatory Visit (INDEPENDENT_AMBULATORY_CARE_PROVIDER_SITE_OTHER): Payer: Medicare HMO | Admitting: Nurse Practitioner

## 2022-08-08 ENCOUNTER — Telehealth: Payer: Self-pay | Admitting: Nurse Practitioner

## 2022-08-08 ENCOUNTER — Encounter: Payer: Self-pay | Admitting: Nurse Practitioner

## 2022-08-08 VITALS — BP 144/65 | HR 80 | Temp 98.5°F | Resp 16 | Ht 60.0 in | Wt 245.6 lb

## 2022-08-08 DIAGNOSIS — G8929 Other chronic pain: Secondary | ICD-10-CM | POA: Diagnosis not present

## 2022-08-08 DIAGNOSIS — M5441 Lumbago with sciatica, right side: Secondary | ICD-10-CM

## 2022-08-08 DIAGNOSIS — R0602 Shortness of breath: Secondary | ICD-10-CM

## 2022-08-08 DIAGNOSIS — M5442 Lumbago with sciatica, left side: Secondary | ICD-10-CM | POA: Diagnosis not present

## 2022-08-08 DIAGNOSIS — J4520 Mild intermittent asthma, uncomplicated: Secondary | ICD-10-CM | POA: Diagnosis not present

## 2022-08-08 DIAGNOSIS — R053 Chronic cough: Secondary | ICD-10-CM

## 2022-08-08 MED ORDER — TRAMADOL HCL 50 MG PO TABS: 50.0000 mg | ORAL_TABLET | Freq: Four times a day (QID) | ORAL | 0 refills | Status: DC | PRN

## 2022-08-08 MED ORDER — MONTELUKAST SODIUM 10 MG PO TABS: 10.0000 mg | ORAL_TABLET | Freq: Every day | ORAL | 3 refills | Status: DC

## 2022-08-08 NOTE — Telephone Encounter (Signed)
Lvm for Diana Bernard to return call regarding CT-Toni

## 2022-08-08 NOTE — Progress Notes (Signed)
[facility] [address] [place], [place] [address]  Internal MEDICINE  Office Visit Note  Patient Name: Diana Bernard  [id number]  [id number]  Date of Service: 08/08/2022  Chief Complaint  Patient presents with   Depression   Gastroesophageal Reflux   Hypertension   Follow-up    HPI Diana Bernard presents for a follow-up visit for for SOB, arthritis and refills.  Tx for sinusitisi/bronchitis in January with levofloxacin PFT done in early march--showed mild obstructive lung disease Seen by Dr. Freda Munro in march and treated again for bronchitis with augmentin Still having chronic cough and SOB. Chest xray is December did not show any significant abnormalities. Last CT chest was done in 2020.  Sees cardiology for heart disease and her last echo was done in April last year with LVEF of 50-55%. Diana Bernard is on medications to improve cardiac function.     Current Medication: Outpatient Encounter Medications as of 08/08/2022  Medication Sig   acetaminophen (TYLENOL) 500 MG tablet Take 500 mg by mouth every 8 (eight) hours. Take 2 tablets by mouth every 8 hours.   albuterol (VENTOLIN HFA) 108 (90 Base) MCG/ACT inhaler Inhale 2 puffs into the lungs every 6 (six) hours as needed for wheezing or shortness of breath.   alendronate (FOSAMAX) 70 MG tablet Take 1 tablet (70 mg total) by mouth every 7 (seven) days. Take with a full glass of water on an empty stomach.   allopurinol (ZYLOPRIM) 100 MG tablet TAKE 1 TABLET EVERY DAY   benzonatate (TESSALON) 200 MG capsule Take 1 capsule (200 mg total) by mouth 2 (two) times daily as needed for cough.   cholecalciferol (VITAMIN D) 1000 units tablet Take 1,000 Units by mouth daily.   clotrimazole (LOTRIMIN) 1 % cream Apply 1 Application topically 2 (two) times daily. Apply topically 2x daily.   colchicine 0.6 MG tablet TAKE 1 TABLET EVERY DAY AS NEEDED FOR GOUT FLARE   Cyanocobalamin (VITAMIN B-12 PO) Take by mouth. Pt takes [place]  gummies   diclofenac Sodium (VOLTAREN) 1 % GEL Apply topically 4 (four) times daily.   Dulaglutide 0.75 MG/0.5ML SOPN Inject 0.75 mg into the skin once a week.   esomeprazole (NEXIUM) 20 MG capsule Take 20 mg by mouth daily at 12 noon.   fluticasone-salmeterol (ADVAIR) 250-50 MCG/ACT AEPB Inhale 1 puff into the lungs in the morning and at bedtime.   furosemide (LASIX) 20 MG tablet Take 1 tablet (20 mg total) by mouth daily.   gabapentin (NEURONTIN) 300 MG capsule Take 1 capsule by mouth in morning, 1 capsule at midday and  3 capsule at bedtime   ipratropium-albuterol (DUONEB) 0.5-2.5 (3) MG/3ML SOLN Take 3 mLs by nebulization every 4 (four) hours as needed.   lidocaine 4 % Place 1 patch onto the skin daily.   lisinopril (ZESTRIL) 20 MG tablet Take 1 tablet (20 mg total) by mouth daily.   loratadine (CLARITIN) 10 MG tablet Take 1 tablet (10 mg total) by mouth daily.   magnesium oxide (MAG-OX) 400 (240 Mg) MG tablet Take 400 mg by mouth 2 (two) times daily.   metoprolol succinate (TOPROL-XL) 50 MG 24 hr tablet Take 1 tablet (50 mg total) by mouth daily. Take with or immediately following a meal.   Multiple Vitamin (MULTIVITAMIN) capsule Take 1 capsule by mouth daily.   nitrofurantoin, macrocrystal-monohydrate, (MACROBID) 100 MG capsule TAKE 1 CAPSULE EVERY MONDAY, WEDNESDAY, AND FRIDAY   ondansetron (ZOFRAN-ODT) 4 MG disintegrating tablet Take 1 tablet (4 mg total)  by mouth every 8 (eight) hours as needed for nausea or vomiting.   potassium chloride SA (KLOR-CON M) 20 MEQ tablet TAKE 1 TABLET EVERY DAY   rosuvastatin (CRESTOR) 10 MG tablet TAKE 1 TABLET TWICE WEEKLY   traMADol (ULTRAM) 50 MG tablet Take 1 tablet (50 mg total) by mouth every 6 (six) hours as needed for up to 5 days for severe pain.   venlafaxine XR (EFFEXOR-XR) 37.5 MG 24 hr capsule TAKE 1 CAPSULE EVERY DAY   [DISCONTINUED] amoxicillin-clavulanate (AUGMENTIN) 875-125 MG tablet Take 1 tablet by mouth 2 (two) times daily.    [DISCONTINUED] montelukast (SINGULAIR) 10 MG tablet Take 1 tablet (10 mg total) by mouth at bedtime.   [DISCONTINUED] predniSONE (DELTASONE) 10 MG tablet Take 1 tab po 3 x day for 3 days then take 1 tab po 2 x a day for 3 days and then take 1 tab po daily for 3 days   montelukast (SINGULAIR) 10 MG tablet Take 1 tablet (10 mg total) by mouth at bedtime.   No facility-administered encounter medications on file as of 08/08/2022.    Surgical History: Past Surgical History:  Procedure Laterality Date   ABCESS DRAINAGE  2016   Abdominal abcess due to diverticulitis   caridoverter defibrillator  11/14/2017   ICD   CATARACT EXTRACTION W/PHACO Left 10/26/2015   Procedure: CATARACT EXTRACTION PHACO AND INTRAOCULAR LENS PLACEMENT (IOC) LEFT EYE;  Surgeon: Lockie Molahadwick Brasington, MD;  [facility];  Service: Ophthalmology;  Laterality: Left;   CATARACT EXTRACTION W/PHACO Right 12/07/2015   Procedure: CATARACT EXTRACTION PHACO AND INTRAOCULAR LENS PLACEMENT (IOC);  Surgeon: Lockie Molahadwick Brasington, MD;  [facility];  Service: Ophthalmology;  Laterality: Right;   CHOLECYSTECTOMY     FOOT SURGERY     Per patient, bilateral foot surgery.   OSTOMY  01/25/2021   OSTOMY  02/11/2021   PELVIC FRACTURE SURGERY     Per patient for cancer.    Medical History: Past Medical History:  Diagnosis Date   Arthritis    Asthma    Cancer    Deep vein thrombosis (DVT) 2015   Depression    GERD (gastroesophageal reflux disease)    Hypertension    Weakness of both legs     Family History: Family History  Problem Relation Age of Onset   Depression Sister    Heart disease Sister    Early death Sister    Depression Sister    Depression Sister    Depression Sister    Depression Sister     Social History   Socioeconomic History   Marital status: Married    Spouse name: Not on file   Number of children: Not on file   Years of education: Not on file   Highest education  level: Not on file  Occupational History   Not on file  Tobacco Use   Smoking status: Never   Smokeless tobacco: Never  Vaping Use   Vaping Use: Never used  Substance and Sexual Activity   Alcohol use: No   Drug use: No   Sexual activity: Not Currently  Other Topics Concern   Not on file  Social History Narrative   Not on file   Social Determinants of Health   Financial Resource Strain: Low Risk  (08/03/2020)   Overall Financial Resource Strain (CARDIA)    Difficulty of Paying Living Expenses: Not very hard  Food Insecurity: Not on file  Transportation Needs: Not on file  Physical Activity: Not on  file  Stress: Not on file  Social Connections: Not on file  Intimate Partner Violence: Not on file      Review of Systems  Constitutional:  Negative for fatigue.  HENT:  Positive for congestion and postnasal drip. Negative for ear pain, rhinorrhea, sinus pressure, sinus pain, sneezing and sore throat.   Respiratory:  Positive for cough and shortness of breath. Negative for chest tightness and wheezing.   Cardiovascular: Negative.  Negative for chest pain and palpitations.  Gastrointestinal:  Negative for abdominal pain, constipation, diarrhea, nausea and vomiting.  Neurological:  Positive for headaches.    Vital Signs: BP (!) 144/65   Pulse 80   Temp 98.5 F (36.9 C)   Resp 16   Ht 5' (1.524 m)   Wt 245 lb 9.6 oz (111.4 kg)   SpO2 94%   BMI 47.97 kg/m    Physical Exam Vitals reviewed.  Constitutional:      General: Diana Bernard is not in acute distress.    Appearance: Normal appearance. Diana Bernard is obese. Diana Bernard is not ill-appearing.  HENT:     Head: Normocephalic and atraumatic.  Eyes:     Pupils: Pupils are equal, round, and reactive to light.  Cardiovascular:     Rate and Rhythm: Normal rate and regular rhythm.  Pulmonary:     Effort: Pulmonary effort is normal. No respiratory distress.  Neurological:     Mental Status: Diana Bernard is alert and oriented to person, place, and  time.  Psychiatric:        Mood and Affect: Mood normal.        Behavior: Behavior normal.        Assessment/Plan: 1. Chronic cough CT chest ordered for further evaluation of chronic cough and SOB - CT Chest Wo Contrast; Future  2. SOB (shortness of breath) CT chest ordered - CT Chest Wo Contrast; Future  3. Chronic asthma, mild intermittent, uncomplicated Refill ordered for montelukast, continue medication as prescribed.  - montelukast (SINGULAIR) 10 MG tablet; Take 1 tablet (10 mg total) by mouth at bedtime.  Dispense: 90 tablet; Refill: 3  4. Chronic bilateral low back pain with bilateral sciatica Take tramadol prn as prescribed.  - traMADol (ULTRAM) 50 MG tablet; Take 1 tablet (50 mg total) by mouth every 6 (six) hours as needed for up to 5 days for severe pain.  Dispense: 20 tablet; Refill: 0   General Counseling: Roshawna verbalizes understanding of the findings of todays visit and agrees with plan of treatment. I have discussed any further diagnostic evaluation that may be needed or ordered today. We also reviewed her medications today. Diana Bernard has been encouraged to call the office with any questions or concerns that should arise related to todays visit.    Orders Placed This Encounter  Procedures   CT Chest Wo Contrast    Meds ordered this encounter  Medications   traMADol (ULTRAM) 50 MG tablet    Sig: Take 1 tablet (50 mg total) by mouth every 6 (six) hours as needed for up to 5 days for severe pain.    Dispense:  20 tablet    Refill:  0    Initial script, may request refill.   montelukast (SINGULAIR) 10 MG tablet    Sig: Take 1 tablet (10 mg total) by mouth at bedtime.    Dispense:  90 tablet    Refill:  3    Return for previously scheduled, CPE, Stana Bayon PCP in july.   Total time spent:30 Minutes Time spent  includes review of chart, medications, test results, and follow up plan with the patient.   [place] Controlled Substance Database was reviewed by me.  This  patient was seen by Sallyanne Kuster, FNP-C in collaboration with Dr. Beverely Risen as a part of collaborative care agreement.   Jaydn Fincher R. Tedd Sias, MSN, FNP-C Internal medicine

## 2022-08-08 NOTE — Telephone Encounter (Signed)
Notified daughter of CT appointment date, arrival time and location-[facility]

## 2022-08-14 DIAGNOSIS — S31829A Unspecified open wound of left buttock, initial encounter: Secondary | ICD-10-CM | POA: Diagnosis not present

## 2022-08-14 DIAGNOSIS — S31101A Unspecified open wound of abdominal wall, left upper quadrant without penetration into peritoneal cavity, initial encounter: Secondary | ICD-10-CM | POA: Diagnosis not present

## 2022-08-14 DIAGNOSIS — Z933 Colostomy status: Secondary | ICD-10-CM | POA: Diagnosis not present

## 2022-08-14 DIAGNOSIS — Z433 Encounter for attention to colostomy: Secondary | ICD-10-CM | POA: Diagnosis not present

## 2022-08-15 ENCOUNTER — Ambulatory Visit
Admission: RE | Admit: 2022-08-15 | Discharge: 2022-08-15 | Disposition: A | Discharge status: Home / Self Care | Payer: Medicare HMO | Source: Ambulatory Visit | Attending: Nurse Practitioner | Admitting: Nurse Practitioner

## 2022-08-15 DIAGNOSIS — R053 Chronic cough: Secondary | ICD-10-CM | POA: Insufficient documentation

## 2022-08-15 DIAGNOSIS — R0602 Shortness of breath: Secondary | ICD-10-CM | POA: Insufficient documentation

## 2022-08-15 DIAGNOSIS — I3139 Other pericardial effusion (noninflammatory): Secondary | ICD-10-CM | POA: Diagnosis not present

## 2022-08-15 DIAGNOSIS — R0600 Dyspnea, unspecified: Secondary | ICD-10-CM | POA: Diagnosis not present

## 2022-08-20 NOTE — Progress Notes (Signed)
Will discuss results at upcoming office visit

## 2022-08-23 IMAGING — CR DG CHEST 2V
1 series · 2 of 2 positions shown · non-contrast
Comparison: January 27, 2019.

CLINICAL DATA: Shortness of breath.

EXAM:
CHEST - 2 VIEW

[Series 1: dg chest 2 view · 0.14mm/px · 2 of 2 slices shown]
[im 1/2]
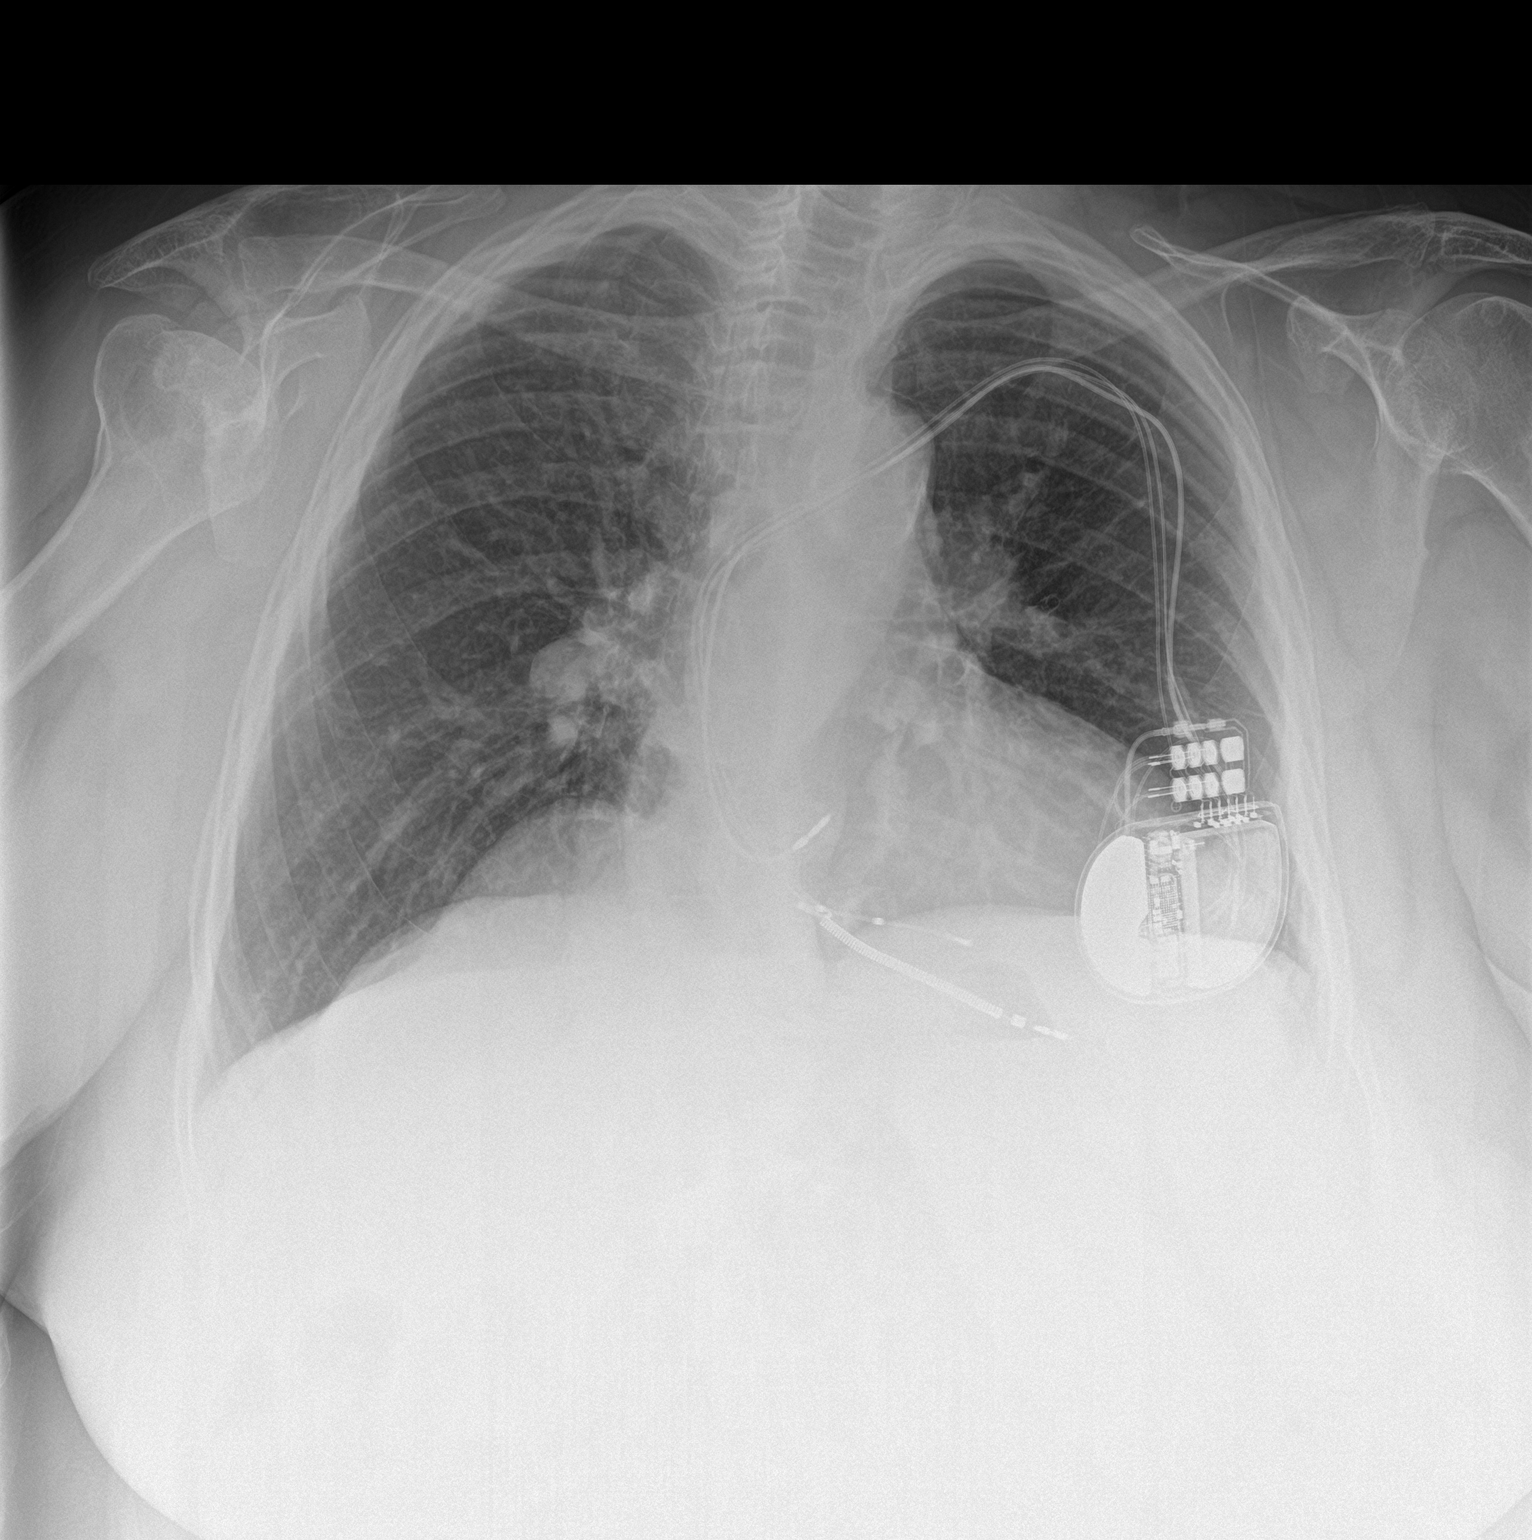
[im 2/2]
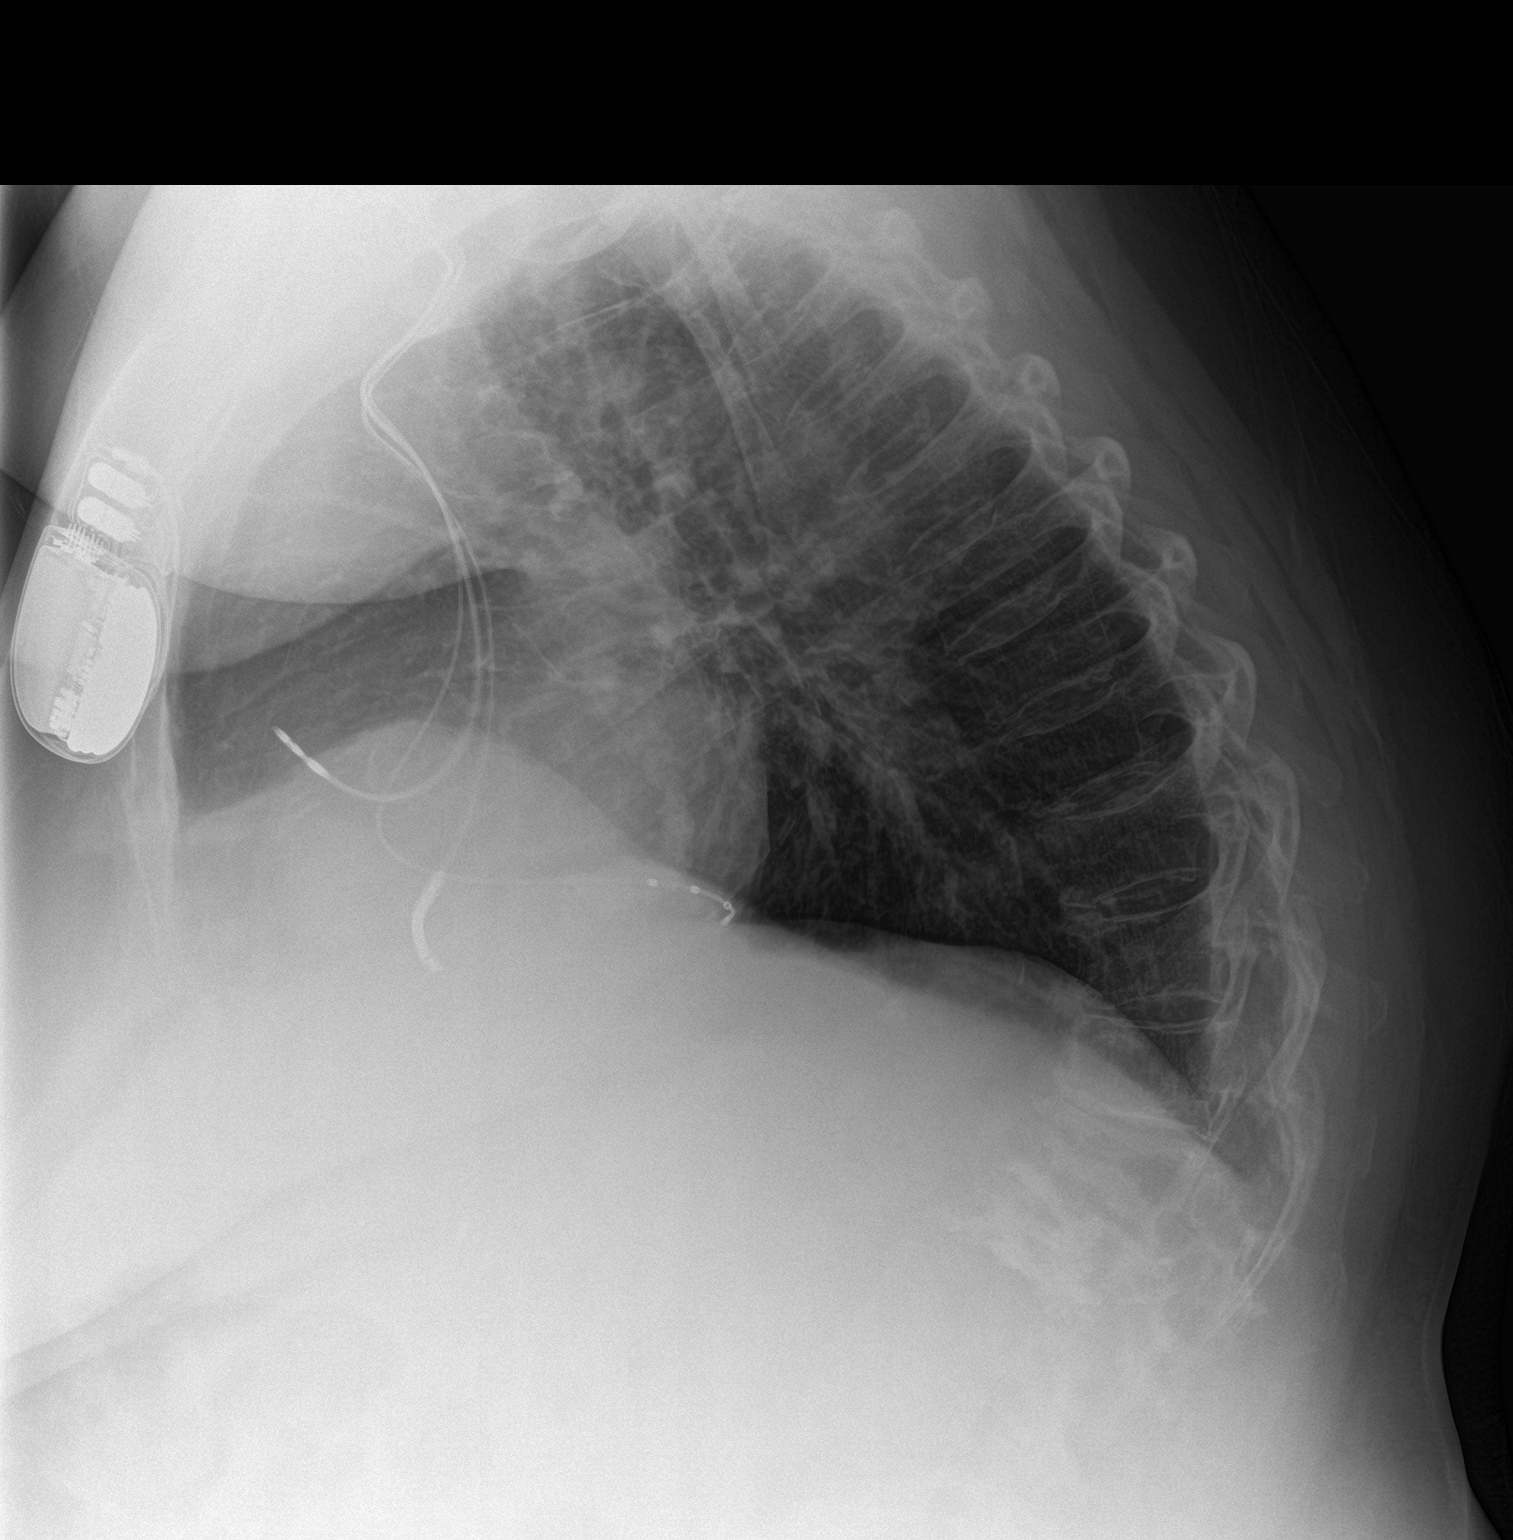

[2 of 2 positions shown; findings below may reference images not displayed]

FINDINGS: The heart size and mediastinal contours are within normal limits.
Left-sided defibrillator is unchanged in position. No pneumothorax
or pleural effusion is noted. Both lungs are clear. The visualized
skeletal structures are unremarkable.
IMPRESSION: No active cardiopulmonary disease.

## 2022-08-24 ENCOUNTER — Ambulatory Visit (INDEPENDENT_AMBULATORY_CARE_PROVIDER_SITE_OTHER): Payer: Medicare HMO | Admitting: Nurse Practitioner

## 2022-08-24 ENCOUNTER — Encounter: Payer: Self-pay | Admitting: Nurse Practitioner

## 2022-08-24 VITALS — BP 130/80 | HR 77 | Temp 98.3°F | Resp 16 | Ht 60.0 in | Wt 243.0 lb

## 2022-08-24 DIAGNOSIS — J4520 Mild intermittent asthma, uncomplicated: Secondary | ICD-10-CM | POA: Diagnosis not present

## 2022-08-24 DIAGNOSIS — I2510 Atherosclerotic heart disease of native coronary artery without angina pectoris: Secondary | ICD-10-CM

## 2022-08-24 DIAGNOSIS — I700 Atherosclerosis of aorta: Secondary | ICD-10-CM | POA: Diagnosis not present

## 2022-08-24 DIAGNOSIS — R9389 Abnormal findings on diagnostic imaging of other specified body structures: Secondary | ICD-10-CM | POA: Diagnosis not present

## 2022-08-24 DIAGNOSIS — R590 Localized enlarged lymph nodes: Secondary | ICD-10-CM

## 2022-08-24 MED ORDER — FLUTICASONE-SALMETEROL 500-50 MCG/ACT IN AEPB
1.0000 | INHALATION_SPRAY | Freq: Two times a day (BID) | RESPIRATORY_TRACT | 5 refills | Status: DC
Start: 2022-08-24 — End: 2023-04-29

## 2022-08-24 NOTE — Progress Notes (Unsigned)
[facility] [address] [place], [place] [address]  Internal MEDICINE  Office Visit Note  Patient Name: Diana Bernard  [id number]  [id number]  Date of Service: 08/24/2022  Chief Complaint  Patient presents with   Follow-up    Review CT   Depression   Gastroesophageal Reflux   Hypertension    HPI Diana Bernard presents for a follow-up visit for   Reviewed CT chest results --     Current Medication: Outpatient Encounter Medications as of 08/24/2022  Medication Sig   acetaminophen (TYLENOL) 500 MG tablet Take 500 mg by mouth every 8 (eight) hours. Take 2 tablets by mouth every 8 hours.   albuterol (VENTOLIN HFA) 108 (90 Base) MCG/ACT inhaler Inhale 2 puffs into the lungs every 6 (six) hours as needed for wheezing or shortness of breath.   alendronate (FOSAMAX) 70 MG tablet Take 1 tablet (70 mg total) by mouth every 7 (seven) days. Take with a full glass of water on an empty stomach.   allopurinol (ZYLOPRIM) 100 MG tablet TAKE 1 TABLET EVERY DAY   benzonatate (TESSALON) 200 MG capsule Take 1 capsule (200 mg total) by mouth 2 (two) times daily as needed for cough.   cholecalciferol (VITAMIN D) 1000 units tablet Take 1,000 Units by mouth daily.   clotrimazole (LOTRIMIN) 1 % cream Apply 1 Application topically 2 (two) times daily. Apply topically 2x daily.   colchicine 0.6 MG tablet TAKE 1 TABLET EVERY DAY AS NEEDED FOR GOUT FLARE   Cyanocobalamin (VITAMIN B-12 PO) Take by mouth. Pt takes [place] gummies   diclofenac Sodium (VOLTAREN) 1 % GEL Apply topically 4 (four) times daily.   Dulaglutide 0.75 MG/0.5ML SOPN Inject 0.75 mg into the skin once a week.   esomeprazole (NEXIUM) 20 MG capsule Take 20 mg by mouth daily at 12 noon.   fluticasone-salmeterol (ADVAIR) 500-50 MCG/ACT AEPB Inhale 1 puff into the lungs in the morning and at bedtime.   furosemide (LASIX) 20 MG tablet Take 1 tablet (20 mg total) by mouth daily.   gabapentin (NEURONTIN) 300 MG capsule Take 1  capsule by mouth in morning, 1 capsule at midday and  3 capsule at bedtime   ipratropium-albuterol (DUONEB) 0.5-2.5 (3) MG/3ML SOLN Take 3 mLs by nebulization every 4 (four) hours as needed.   lidocaine 4 % Place 1 patch onto the skin daily.   lisinopril (ZESTRIL) 20 MG tablet Take 1 tablet (20 mg total) by mouth daily.   loratadine (CLARITIN) 10 MG tablet Take 1 tablet (10 mg total) by mouth daily.   magnesium oxide (MAG-OX) 400 (240 Mg) MG tablet Take 400 mg by mouth 2 (two) times daily.   metoprolol succinate (TOPROL-XL) 50 MG 24 hr tablet Take 1 tablet (50 mg total) by mouth daily. Take with or immediately following a meal.   montelukast (SINGULAIR) 10 MG tablet Take 1 tablet (10 mg total) by mouth at bedtime.   Multiple Vitamin (MULTIVITAMIN) capsule Take 1 capsule by mouth daily.   nitrofurantoin, macrocrystal-monohydrate, (MACROBID) 100 MG capsule TAKE 1 CAPSULE EVERY MONDAY, WEDNESDAY, AND FRIDAY   ondansetron (ZOFRAN-ODT) 4 MG disintegrating tablet Take 1 tablet (4 mg total) by mouth every 8 (eight) hours as needed for nausea or vomiting.   potassium chloride SA (KLOR-CON M) 20 MEQ tablet TAKE 1 TABLET EVERY DAY   rosuvastatin (CRESTOR) 10 MG tablet TAKE 1 TABLET TWICE WEEKLY   venlafaxine XR (EFFEXOR-XR) 37.5 MG 24 hr capsule TAKE 1 CAPSULE EVERY DAY   [DISCONTINUED] fluticasone-salmeterol (  ADVAIR) 250-50 MCG/ACT AEPB Inhale 1 puff into the lungs in the morning and at bedtime.   No facility-administered encounter medications on file as of 08/24/2022.    Surgical History: Past Surgical History:  Procedure Laterality Date   ABCESS DRAINAGE  2016   Abdominal abcess due to diverticulitis   caridoverter defibrillator  11/14/2017   ICD   CATARACT EXTRACTION W/PHACO Left 10/26/2015   Procedure: CATARACT EXTRACTION PHACO AND INTRAOCULAR LENS PLACEMENT (IOC) LEFT EYE;  Surgeon: Lockie Mola, MD;  Location: [facility];  Service: Ophthalmology;  Laterality: Left;   CATARACT  EXTRACTION W/PHACO Right 12/07/2015   Procedure: CATARACT EXTRACTION PHACO AND INTRAOCULAR LENS PLACEMENT (IOC);  Surgeon: Lockie Mola, MD;  Location: [facility];  Service: Ophthalmology;  Laterality: Right;   CHOLECYSTECTOMY     FOOT SURGERY     Per patient, bilateral foot surgery.   OSTOMY  01/25/2021   OSTOMY  02/11/2021   PELVIC FRACTURE SURGERY     Per patient for cancer.    Medical History: Past Medical History:  Diagnosis Date   Arthritis    Asthma    Cancer (HCC)    Deep vein thrombosis (DVT) (HCC) 2015   Depression    GERD (gastroesophageal reflux disease)    Hypertension    Weakness of both legs     Family History: Family History  Problem Relation Age of Onset   Depression Sister    Heart disease Sister    Early death Sister    Depression Sister    Depression Sister    Depression Sister    Depression Sister     Social History   Socioeconomic History   Marital status: Married    Spouse name: Not on file   Number of children: Not on file   Years of education: Not on file   Highest education level: Not on file  Occupational History   Not on file  Tobacco Use   Smoking status: Never   Smokeless tobacco: Never  Vaping Use   Vaping Use: Never used  Substance and Sexual Activity   Alcohol use: No   Drug use: No   Sexual activity: Not Currently  Other Topics Concern   Not on file  Social History Narrative   Not on file   Social Determinants of Health   Financial Resource Strain: Low Risk  (08/03/2020)   Overall Financial Resource Strain (CARDIA)    Difficulty of Paying Living Expenses: Not very hard  Food Insecurity: Not on file  Transportation Needs: Not on file  Physical Activity: Not on file  Stress: Not on file  Social Connections: Not on file  Intimate Partner Violence: Not on file      Review of Systems  Vital Signs: BP (!) 130/90   Pulse 77   Temp 98.3 F (36.8 C)   Resp 16   Ht 5' (1.524 m)   Wt 243 lb (110.2  kg)   SpO2 97%   BMI 47.46 kg/m    Physical Exam     Assessment/Plan:   General Counseling: Diana Bernard verbalizes understanding of the findings of todays visit and agrees with plan of treatment. I have discussed any further diagnostic evaluation that may be needed or ordered today. We also reviewed her medications today. she has been encouraged to call the office with any questions or concerns that should arise related to todays visit.    Orders Placed This Encounter  Procedures   MM 3D DIAGNOSTIC MAMMOGRAM BILATERAL BREAST  Meds ordered this encounter  Medications   fluticasone-salmeterol (ADVAIR) 500-50 MCG/ACT AEPB    Sig: Inhale 1 puff into the lungs in the morning and at bedtime.    Dispense:  60 each    Refill:  5    Discontinue 250-50 mcg dose and fill new script now    No follow-ups on file.   Total time spent:*** Minutes Time spent includes review of chart, medications, test results, and follow up plan with the patient.   [place] Controlled Substance Database was reviewed by me.  This patient was seen by Sallyanne Kuster, FNP-C in collaboration with Dr. Beverely Risen as a part of collaborative care agreement.   Jazon Jipson R. Tedd Sias, MSN, FNP-C Internal medicine

## 2022-08-25 ENCOUNTER — Encounter: Payer: Self-pay | Admitting: Nurse Practitioner

## 2022-08-27 DIAGNOSIS — I2510 Atherosclerotic heart disease of native coronary artery without angina pectoris: Secondary | ICD-10-CM | POA: Diagnosis not present

## 2022-08-27 DIAGNOSIS — E119 Type 2 diabetes mellitus without complications: Secondary | ICD-10-CM | POA: Diagnosis not present

## 2022-08-27 DIAGNOSIS — J449 Chronic obstructive pulmonary disease, unspecified: Secondary | ICD-10-CM | POA: Diagnosis not present

## 2022-08-27 DIAGNOSIS — R079 Chest pain, unspecified: Secondary | ICD-10-CM | POA: Diagnosis not present

## 2022-08-27 DIAGNOSIS — I272 Pulmonary hypertension, unspecified: Secondary | ICD-10-CM | POA: Diagnosis not present

## 2022-08-27 DIAGNOSIS — I428 Other cardiomyopathies: Secondary | ICD-10-CM | POA: Diagnosis not present

## 2022-08-27 DIAGNOSIS — I5022 Chronic systolic (congestive) heart failure: Secondary | ICD-10-CM | POA: Diagnosis not present

## 2022-08-27 DIAGNOSIS — I5042 Chronic combined systolic (congestive) and diastolic (congestive) heart failure: Secondary | ICD-10-CM | POA: Diagnosis not present

## 2022-08-27 DIAGNOSIS — R0602 Shortness of breath: Secondary | ICD-10-CM | POA: Diagnosis not present

## 2022-08-27 DIAGNOSIS — N189 Chronic kidney disease, unspecified: Secondary | ICD-10-CM | POA: Diagnosis not present

## 2022-08-27 DIAGNOSIS — I5020 Unspecified systolic (congestive) heart failure: Secondary | ICD-10-CM | POA: Diagnosis not present

## 2022-08-27 DIAGNOSIS — I130 Hypertensive heart and chronic kidney disease with heart failure and stage 1 through stage 4 chronic kidney disease, or unspecified chronic kidney disease: Secondary | ICD-10-CM | POA: Diagnosis not present

## 2022-08-29 DIAGNOSIS — I5022 Chronic systolic (congestive) heart failure: Secondary | ICD-10-CM | POA: Diagnosis not present

## 2022-08-29 DIAGNOSIS — R0602 Shortness of breath: Secondary | ICD-10-CM | POA: Diagnosis not present

## 2022-08-29 DIAGNOSIS — R079 Chest pain, unspecified: Secondary | ICD-10-CM | POA: Diagnosis not present

## 2022-09-07 DIAGNOSIS — R928 Other abnormal and inconclusive findings on diagnostic imaging of breast: Secondary | ICD-10-CM | POA: Diagnosis not present

## 2022-09-15 ENCOUNTER — Other Ambulatory Visit: Payer: Self-pay | Admitting: Nurse Practitioner

## 2022-09-15 DIAGNOSIS — I10 Essential (primary) hypertension: Secondary | ICD-10-CM

## 2022-09-15 DIAGNOSIS — I5022 Chronic systolic (congestive) heart failure: Secondary | ICD-10-CM

## 2022-09-17 ENCOUNTER — Other Ambulatory Visit: Payer: Self-pay | Admitting: Nurse Practitioner

## 2022-09-17 DIAGNOSIS — G8929 Other chronic pain: Secondary | ICD-10-CM

## 2022-09-17 NOTE — Telephone Encounter (Signed)
Please review and send  next 6/24

## 2022-09-20 ENCOUNTER — Other Ambulatory Visit: Payer: Self-pay | Admitting: Nurse Practitioner

## 2022-09-20 DIAGNOSIS — M10079 Idiopathic gout, unspecified ankle and foot: Secondary | ICD-10-CM

## 2022-10-01 DIAGNOSIS — Z933 Colostomy status: Secondary | ICD-10-CM | POA: Diagnosis not present

## 2022-10-01 DIAGNOSIS — Z433 Encounter for attention to colostomy: Secondary | ICD-10-CM | POA: Diagnosis not present

## 2022-10-01 DIAGNOSIS — S31829A Unspecified open wound of left buttock, initial encounter: Secondary | ICD-10-CM | POA: Diagnosis not present

## 2022-10-01 DIAGNOSIS — S31101A Unspecified open wound of abdominal wall, left upper quadrant without penetration into peritoneal cavity, initial encounter: Secondary | ICD-10-CM | POA: Diagnosis not present

## 2022-10-06 DIAGNOSIS — Z4502 Encounter for adjustment and management of automatic implantable cardiac defibrillator: Secondary | ICD-10-CM | POA: Diagnosis not present

## 2022-10-06 DIAGNOSIS — Z95810 Presence of automatic (implantable) cardiac defibrillator: Secondary | ICD-10-CM | POA: Diagnosis not present

## 2022-10-06 DIAGNOSIS — I5022 Chronic systolic (congestive) heart failure: Secondary | ICD-10-CM | POA: Diagnosis not present

## 2022-10-11 DIAGNOSIS — L821 Other seborrheic keratosis: Secondary | ICD-10-CM | POA: Diagnosis not present

## 2022-10-11 DIAGNOSIS — D229 Melanocytic nevi, unspecified: Secondary | ICD-10-CM | POA: Diagnosis not present

## 2022-10-11 DIAGNOSIS — Z85828 Personal history of other malignant neoplasm of skin: Secondary | ICD-10-CM | POA: Diagnosis not present

## 2022-10-11 DIAGNOSIS — L814 Other melanin hyperpigmentation: Secondary | ICD-10-CM | POA: Diagnosis not present

## 2022-10-11 DIAGNOSIS — L578 Other skin changes due to chronic exposure to nonionizing radiation: Secondary | ICD-10-CM | POA: Diagnosis not present

## 2022-10-22 ENCOUNTER — Encounter: Payer: Self-pay | Admitting: Physician Assistant

## 2022-10-22 ENCOUNTER — Ambulatory Visit (INDEPENDENT_AMBULATORY_CARE_PROVIDER_SITE_OTHER): Payer: Medicare HMO | Admitting: Physician Assistant

## 2022-10-22 VITALS — BP 150/73 | HR 98 | Temp 97.8°F | Resp 16 | Ht 60.0 in | Wt 245.0 lb

## 2022-10-22 DIAGNOSIS — R9389 Abnormal findings on diagnostic imaging of other specified body structures: Secondary | ICD-10-CM | POA: Diagnosis not present

## 2022-10-22 DIAGNOSIS — J4520 Mild intermittent asthma, uncomplicated: Secondary | ICD-10-CM | POA: Diagnosis not present

## 2022-10-22 NOTE — Progress Notes (Signed)
[facility] [address] [place], [place] [address]  Pulmonary Sleep Medicine   Office Visit Note  Patient Name: Diana Bernard DOB: 1946/01/31 MRN [id number]  Date of Service: 10/22/2022  Complaints/HPI: Pt is here for routine pulmonary follow up. Does have some SOB with exertion, does ok while sitting. Has not needed any neb treatments, using advair as prescibed. Also taking Singulair. Cardiology appt in a a few weeks as well and does think some of SOB is due to her heart failure. Oxygen sats at home have been in the 90s and last PFT showed mild COPD. She did have a previously abnormal CT chest with PCP which recommended diagnostic mammogram and follow up CT in 6 weeks and will order this now before she has follow up with PCP again. Has an upcoming stress test as well.   ROS  General: (-) fever, (-) chills, (-) night sweats, (-) weakness Skin: (-) rashes, (-) itching,. Eyes: (-) visual changes, (-) redness, (-) itching. Nose and Sinuses: (-) nasal stuffiness or itchiness, (-) postnasal drip, (-) nosebleeds, (-) sinus trouble. Mouth and Throat: (-) sore throat, (-) hoarseness. Neck: (-) swollen glands, (-) enlarged thyroid, (-) neck pain. Respiratory: - cough, (-) bloody sputum, + shortness of breath, - wheezing. Cardiovascular: - ankle swelling, (-) chest pain. Lymphatic: (-) lymph node enlargement. Neurologic: (-) numbness, (-) tingling. Psychiatric: (-) anxiety, (-) depression   Current Medication: Outpatient Encounter Medications as of 10/22/2022  Medication Sig   acetaminophen (TYLENOL) 500 MG tablet Take 500 mg by mouth every 8 (eight) hours. Take 2 tablets by mouth every 8 hours.   albuterol (VENTOLIN HFA) 108 (90 Base) MCG/ACT inhaler Inhale 2 puffs into the lungs every 6 (six) hours as needed for wheezing or shortness of breath.   alendronate (FOSAMAX) 70 MG tablet Take 1 tablet (70 mg total) by mouth every 7 (seven) days. Take with a full glass of water  on an empty stomach.   allopurinol (ZYLOPRIM) 100 MG tablet TAKE 1 TABLET EVERY DAY   benzonatate (TESSALON) 200 MG capsule Take 1 capsule (200 mg total) by mouth 2 (two) times daily as needed for cough.   cholecalciferol (VITAMIN D) 1000 units tablet Take 1,000 Units by mouth daily.   clotrimazole (LOTRIMIN) 1 % cream Apply 1 Application topically 2 (two) times daily. Apply topically 2x daily.   colchicine 0.6 MG tablet TAKE 1 TABLET EVERY DAY AS NEEDED FOR GOUT FLARE   Cyanocobalamin (VITAMIN B-12 PO) Take by mouth. Pt takes [place] gummies   diclofenac Sodium (VOLTAREN) 1 % GEL Apply topically 4 (four) times daily.   Dulaglutide 0.75 MG/0.5ML SOPN Inject 0.75 mg into the skin once a week.   esomeprazole (NEXIUM) 20 MG capsule Take 20 mg by mouth daily at 12 noon.   fluticasone-salmeterol (ADVAIR) 500-50 MCG/ACT AEPB Inhale 1 puff into the lungs in the morning and at bedtime.   furosemide (LASIX) 20 MG tablet TAKE 1 TABLET EVERY DAY   gabapentin (NEURONTIN) 300 MG capsule Take 1 capsule by mouth in morning, 1 capsule at midday and  3 capsule at bedtime   ipratropium-albuterol (DUONEB) 0.5-2.5 (3) MG/3ML SOLN Take 3 mLs by nebulization every 4 (four) hours as needed.   lidocaine 4 % Place 1 patch onto the skin daily.   lisinopril (ZESTRIL) 20 MG tablet Take 1 tablet (20 mg total) by mouth daily.   loratadine (CLARITIN) 10 MG tablet Take 1 tablet (10 mg total) by mouth daily.   magnesium oxide (  MAG-OX) 400 (240 Mg) MG tablet Take 400 mg by mouth 2 (two) times daily.   metoprolol succinate (TOPROL-XL) 50 MG 24 hr tablet Take 1 tablet (50 mg total) by mouth daily. Take with or immediately following a meal.   montelukast (SINGULAIR) 10 MG tablet Take 1 tablet (10 mg total) by mouth at bedtime.   Multiple Vitamin (MULTIVITAMIN) capsule Take 1 capsule by mouth daily.   nitrofurantoin, macrocrystal-monohydrate, (MACROBID) 100 MG capsule TAKE 1 CAPSULE EVERY MONDAY, WEDNESDAY, AND FRIDAY    ondansetron (ZOFRAN-ODT) 4 MG disintegrating tablet Take 1 tablet (4 mg total) by mouth every 8 (eight) hours as needed for nausea or vomiting.   potassium chloride SA (KLOR-CON M) 20 MEQ tablet TAKE 1 TABLET EVERY DAY   rosuvastatin (CRESTOR) 10 MG tablet TAKE 1 TABLET TWICE WEEKLY   traMADol (ULTRAM) 50 MG tablet Take one tab po bid for pain prn   venlafaxine XR (EFFEXOR-XR) 37.5 MG 24 hr capsule TAKE 1 CAPSULE EVERY DAY   No facility-administered encounter medications on file as of 10/22/2022.    Surgical History: Past Surgical History:  Procedure Laterality Date   ABCESS DRAINAGE  2016   Abdominal abcess due to diverticulitis   caridoverter defibrillator  11/14/2017   ICD   CATARACT EXTRACTION W/PHACO Left 10/26/2015   Procedure: CATARACT EXTRACTION PHACO AND INTRAOCULAR LENS PLACEMENT (IOC) LEFT EYE;  Surgeon: Lockie Mola, MD;  Location: [facility];  Service: Ophthalmology;  Laterality: Left;   CATARACT EXTRACTION W/PHACO Right 12/07/2015   Procedure: CATARACT EXTRACTION PHACO AND INTRAOCULAR LENS PLACEMENT (IOC);  Surgeon: Lockie Mola, MD;  Location: [facility];  Service: Ophthalmology;  Laterality: Right;   CHOLECYSTECTOMY     FOOT SURGERY     Per patient, bilateral foot surgery.   OSTOMY  01/25/2021   OSTOMY  02/11/2021   PELVIC FRACTURE SURGERY     Per patient for cancer.    Medical History: Past Medical History:  Diagnosis Date   Arthritis    Asthma    Cancer (HCC)    Deep vein thrombosis (DVT) (HCC) 2015   Depression    GERD (gastroesophageal reflux disease)    Hypertension    Weakness of both legs     Family History: Family History  Problem Relation Age of Onset   Depression Sister    Heart disease Sister    Early death Sister    Depression Sister    Depression Sister    Depression Sister    Depression Sister     Social History: Social History   Socioeconomic History   Marital status: Married    Spouse name: Not  on file   Number of children: Not on file   Years of education: Not on file   Highest education level: Not on file  Occupational History   Not on file  Tobacco Use   Smoking status: Never   Smokeless tobacco: Never  Vaping Use   Vaping Use: Never used  Substance and Sexual Activity   Alcohol use: No   Drug use: No   Sexual activity: Not Currently  Other Topics Concern   Not on file  Social History Narrative   Not on file   Social Determinants of Health   Financial Resource Strain: Low Risk  (08/03/2020)   Overall Financial Resource Strain (CARDIA)    Difficulty of Paying Living Expenses: Not very hard  Food Insecurity: Not on file  Transportation Needs: Not on file  Physical Activity: Not on file  Stress: Not on file  Social Connections: Not on file  Intimate Partner Violence: Not on file    Vital Signs: Blood pressure (!) 150/73, pulse 98, temperature 97.8 F (36.6 C), resp. rate 16, height 5' (1.524 m), weight 245 lb (111.1 kg), SpO2 94 %.  Examination: General Appearance: The patient is well-developed, well-nourished, and in no distress. Skin: Gross inspection of skin unremarkable. Head: normocephalic, no gross deformities. Eyes: no gross deformities noted. ENT: ears appear grossly normal no exudates. Neck: Supple. No thyromegaly. No LAD. Respiratory: no wheezing or rhonchi. Cardiovascular: Normal S1 and S2 without murmur or rub. Extremities: No cyanosis. pulses are equal. Neurologic: Alert and oriented. No involuntary movements.  LABS: Recent Results (from the past 2160 hour(s))  Pulmonary Function Test     Status: None   Collection Time: 07/26/22  3:25 PM  Result Value Ref Range   FEV1     FVC     FEV1/FVC     TLC     DLCO      Radiology: CT Chest Wo Contrast  Addendum Date: 08/19/2022   ADDENDUM REPORT: 08/19/2022 03:46 ADDENDUM: Right axillary and subpectoral adenopathy is abnormal. Correlation for history of right upper extremity immunization or  evidence of right upper extremity inflammation as well as right breast examination and possible diagnostic mammogram is recommended for further evaluation. In the setting of an inflammatory process, follow-up CT or sonographic imaging in 6 weeks would be helpful to document resolution. Electronically Signed   By: Helyn Numbers M.D.   On: 08/19/2022 03:46   Result Date: 08/19/2022 CLINICAL DATA:  Chronic cough, progressive dyspnea EXAM: CT CHEST WITHOUT CONTRAST TECHNIQUE: Multidetector CT imaging of the chest was performed following the standard protocol without IV contrast. RADIATION DOSE REDUCTION: This exam was performed according to the departmental dose-optimization program which includes automated exposure control, adjustment of the mA and/or kV according to patient size and/or use of iterative reconstruction technique. COMPARISON:  04/21/2019 FINDINGS: Cardiovascular: Extensive multi-vessel coronary artery calcification. Global cardiac size within normal limits. Left subclavian pacemaker defibrillator in place with leads within the right atrium, right ventricular, and left ventricular venous outflow. Extensive calcification of the mitral valve annulus. No pericardial effusion. Central pulmonary arteries are enlarged in keeping with changes of pulmonary arterial hypertension. Moderate atherosclerotic calcification within the thoracic aorta. No aortic aneurysm. Mediastinum/Nodes: Visualized thyroid is unremarkable. No pathologic mediastinal or hilar adenopathy. There are several borderline enlarged but abnormally shaped right subpectoral and axillary lymph nodes which may be reactive or infiltrative in nature. These are new since prior examination. The esophagus is unremarkable. Lungs/Pleura: There is mild inflammatory appearing peribronchial nodularity within the mid and upper lung zones bilaterally, asymmetrically more severe within the a right upper lobe. These may reflect changes of atypical infection,  smoking related lung disease, or hypersensitivity pneumonitis. No confluent pulmonary infiltrate. No pneumothorax or pleural effusion. No central obstructing lesion. Upper Abdomen: No acute abnormality. Musculoskeletal: T7 severe compression deformity with near vertebral plana configuration is noted, likely subacute in nature with small paravertebral infiltration in keeping with edema or interstitial hemorrhage. No canal hematoma. The vertebral body itself is sclerotic and there is mild convex bowing of the posterior wall and a a pathologic fracture related to infiltrative disease is not excluded. Remote L2 compression deformity with evidence of prior vertebroplasty noted. Degenerative changes noted within the lower thoracic spine. IMPRESSION: 1. Mild inflammatory appearing peribronchial nodularity within the mid and upper lung zones bilaterally, asymmetrically more severe within the right upper  lobe. These may reflect changes of atypical infection, smoking related lung disease, or hypersensitivity pneumonitis. 2. Extensive multi-vessel coronary artery calcification. 3. Morphologic changes in keeping with pulmonary arterial hypertension. 4. T7 severe compression deformity with near vertebral plana configuration, likely subacute in nature with small paravertebral infiltration in keeping with edema or interstitial hemorrhage. No canal hematoma. The vertebral body itself is sclerotic and there is mild convex bowing of the posterior wall and a pathologic fracture related to infiltrative disease is not excluded. Contrast enhanced MRI examination would be helpful for further evaluation. Aortic Atherosclerosis (ICD10-I70.0). Electronically Signed: By: Helyn Numbers M.D. On: 08/19/2022 03:21    No results found.  No results found.    Assessment and Plan: Patient Active Problem List   Diagnosis Date Noted   Left carotid artery stenosis 02/28/2022   Aortic atherosclerosis (HCC) 02/28/2022   Dyspnea on exertion  04/03/2020   Hx of malignant neoplasm of uterine body 11/03/2019   Left lower quadrant abdominal pain 10/23/2019   Age-related osteoporosis without current pathological fracture 10/23/2019   Encounter for screening mammogram for malignant neoplasm of breast 10/23/2019   Acquired hypothyroidism 08/02/2019   Other symptoms and signs involving the nervous system 04/06/2019   Memory loss 04/06/2019   Acute upper respiratory infection 01/27/2019   Exposure to COVID-19 virus 01/27/2019   Intermittent claudication (HCC) 11/01/2018   Moderate major depression (HCC) 09/22/2018   Peripheral arterial disease (HCC) 06/25/2018   Diverticulitis of large intestine without perforation or abscess without bleeding 05/29/2018   Other fatigue 03/20/2018   Dysuria 03/20/2018   Gastroenteritis, acute 01/26/2018   Diarrhea 01/26/2018   Nausea 01/26/2018   Chronic systolic heart failure (HCC) 01/26/2018   Cough variant asthma 12/07/2017   Need for vaccination against Streptococcus pneumoniae using pneumococcal conjugate vaccine 13 12/07/2017   AICD lead displacement 11/20/2017   Cardiac resynchronization therapy defibrillator (CRT-D) in place 11/14/2017   Dilated cardiomyopathy (HCC) 09/04/2017   Left bundle branch block 09/04/2017   OSA on CPAP 07/31/2017   Abscess of female pelvis 05/07/2014   Acute deep vein thrombosis (DVT) of femoral vein of left lower extremity (HCC) 03/29/2014   Lumbago-sciatica due to displacement of lumbar intervertebral disc 12/28/2013   Chronic bilateral low back pain with bilateral sciatica 12/28/2013   Neuropathy due to chemotherapeutic drug (HCC) 11/02/2013   Encounter for general adult medical examination with abnormal findings 06/01/2013   Morbid obesity (HCC) 04/20/2013   Essential hypertension 04/13/2013   Malignant neoplasm of endometrium (HCC) 03/30/2013   Shortness of breath 03/30/2013   CMC arthritis, thumb, degenerative 01/02/2013    1. Chronic asthma, mild  intermittent, uncomplicated Continue inhalers as prescibred  2. Abnormal chest CT Will order follow up CT chest for monitoring - CT Chest Wo Contrast; Future   General Counseling: I have discussed the findings of the evaluation and examination with Raeleen.  I have also discussed any further diagnostic evaluation thatmay be needed or ordered today. Kelyse verbalizes understanding of the findings of todays visit. We also reviewed her medications today and discussed drug interactions and side effects including but not limited excessive drowsiness and altered mental states. We also discussed that there is always a risk not just to her but also people around her. she has been encouraged to call the office with any questions or concerns that should arise related to todays visit.  Orders Placed This Encounter  Procedures   CT Chest Wo Contrast    Standing Status:   Future  Standing Expiration Date:   10/22/2023    Order Specific Question:   Preferred imaging location?    Answer:   [facility]     Time spent: 55  I have personally obtained a history, examined the patient, evaluated laboratory and imaging results, formulated the assessment and plan and placed orders. This patient was seen by Lynn Ito, PA-C in collaboration with Dr. Freda Munro as a part of collaborative care agreement.     Yevonne Pax, MD [facility] Pulmonary and Critical Care Sleep medicine

## 2022-10-26 DIAGNOSIS — I130 Hypertensive heart and chronic kidney disease with heart failure and stage 1 through stage 4 chronic kidney disease, or unspecified chronic kidney disease: Secondary | ICD-10-CM | POA: Diagnosis not present

## 2022-10-26 DIAGNOSIS — R0989 Other specified symptoms and signs involving the circulatory and respiratory systems: Secondary | ICD-10-CM | POA: Diagnosis not present

## 2022-10-26 DIAGNOSIS — I447 Left bundle-branch block, unspecified: Secondary | ICD-10-CM | POA: Diagnosis not present

## 2022-10-26 DIAGNOSIS — R0789 Other chest pain: Secondary | ICD-10-CM | POA: Diagnosis not present

## 2022-10-26 DIAGNOSIS — I2510 Atherosclerotic heart disease of native coronary artery without angina pectoris: Secondary | ICD-10-CM | POA: Diagnosis not present

## 2022-10-26 DIAGNOSIS — J449 Chronic obstructive pulmonary disease, unspecified: Secondary | ICD-10-CM | POA: Diagnosis not present

## 2022-10-26 DIAGNOSIS — R079 Chest pain, unspecified: Secondary | ICD-10-CM | POA: Diagnosis not present

## 2022-10-26 DIAGNOSIS — M25512 Pain in left shoulder: Secondary | ICD-10-CM | POA: Diagnosis not present

## 2022-10-26 DIAGNOSIS — R590 Localized enlarged lymph nodes: Secondary | ICD-10-CM | POA: Diagnosis not present

## 2022-10-26 DIAGNOSIS — N189 Chronic kidney disease, unspecified: Secondary | ICD-10-CM | POA: Diagnosis not present

## 2022-10-26 DIAGNOSIS — I5022 Chronic systolic (congestive) heart failure: Secondary | ICD-10-CM | POA: Diagnosis not present

## 2022-10-27 DIAGNOSIS — R0789 Other chest pain: Secondary | ICD-10-CM | POA: Diagnosis not present

## 2022-10-30 DIAGNOSIS — Z933 Colostomy status: Secondary | ICD-10-CM | POA: Diagnosis not present

## 2022-10-30 DIAGNOSIS — S31101A Unspecified open wound of abdominal wall, left upper quadrant without penetration into peritoneal cavity, initial encounter: Secondary | ICD-10-CM | POA: Diagnosis not present

## 2022-10-30 DIAGNOSIS — S31829A Unspecified open wound of left buttock, initial encounter: Secondary | ICD-10-CM | POA: Diagnosis not present

## 2022-10-30 DIAGNOSIS — Z433 Encounter for attention to colostomy: Secondary | ICD-10-CM | POA: Diagnosis not present

## 2022-11-01 ENCOUNTER — Encounter: Payer: Self-pay | Admitting: Nurse Practitioner

## 2022-11-02 MED ORDER — NYSTATIN 100000 UNIT/GM EX POWD: 1.0000 | Freq: Every day | CUTANEOUS | 2 refills | Status: DC | PRN

## 2022-11-05 DIAGNOSIS — C20 Malignant neoplasm of rectum: Secondary | ICD-10-CM | POA: Diagnosis not present

## 2022-11-09 DIAGNOSIS — R0600 Dyspnea, unspecified: Secondary | ICD-10-CM | POA: Diagnosis not present

## 2022-11-09 DIAGNOSIS — I10 Essential (primary) hypertension: Secondary | ICD-10-CM | POA: Diagnosis not present

## 2022-11-09 DIAGNOSIS — I3481 Nonrheumatic mitral (valve) annulus calcification: Secondary | ICD-10-CM | POA: Diagnosis not present

## 2022-11-09 DIAGNOSIS — R0609 Other forms of dyspnea: Secondary | ICD-10-CM | POA: Diagnosis not present

## 2022-11-09 DIAGNOSIS — R0789 Other chest pain: Secondary | ICD-10-CM | POA: Diagnosis not present

## 2022-11-09 DIAGNOSIS — I2510 Atherosclerotic heart disease of native coronary artery without angina pectoris: Secondary | ICD-10-CM | POA: Diagnosis not present

## 2022-11-09 DIAGNOSIS — R931 Abnormal findings on diagnostic imaging of heart and coronary circulation: Secondary | ICD-10-CM | POA: Diagnosis not present

## 2022-11-09 DIAGNOSIS — R079 Chest pain, unspecified: Secondary | ICD-10-CM | POA: Diagnosis not present

## 2022-11-12 ENCOUNTER — Ambulatory Visit: Payer: Medicare HMO | Admitting: Nurse Practitioner

## 2022-11-12 ENCOUNTER — Encounter: Payer: Self-pay | Admitting: Nurse Practitioner

## 2022-11-12 VITALS — BP 133/89 | HR 70 | Temp 98.4°F | Resp 16 | Ht 60.0 in | Wt 249.2 lb

## 2022-11-12 DIAGNOSIS — G620 Drug-induced polyneuropathy: Secondary | ICD-10-CM

## 2022-11-12 DIAGNOSIS — M5442 Lumbago with sciatica, left side: Secondary | ICD-10-CM | POA: Diagnosis not present

## 2022-11-12 DIAGNOSIS — I10 Essential (primary) hypertension: Secondary | ICD-10-CM | POA: Diagnosis not present

## 2022-11-12 DIAGNOSIS — M810 Age-related osteoporosis without current pathological fracture: Secondary | ICD-10-CM

## 2022-11-12 DIAGNOSIS — T451X5A Adverse effect of antineoplastic and immunosuppressive drugs, initial encounter: Secondary | ICD-10-CM

## 2022-11-12 DIAGNOSIS — Z0001 Encounter for general adult medical examination with abnormal findings: Secondary | ICD-10-CM | POA: Diagnosis not present

## 2022-11-12 DIAGNOSIS — Z760 Encounter for issue of repeat prescription: Secondary | ICD-10-CM

## 2022-11-12 DIAGNOSIS — G8929 Other chronic pain: Secondary | ICD-10-CM

## 2022-11-12 DIAGNOSIS — R7303 Prediabetes: Secondary | ICD-10-CM | POA: Diagnosis not present

## 2022-11-12 DIAGNOSIS — M5441 Lumbago with sciatica, right side: Secondary | ICD-10-CM

## 2022-11-12 DIAGNOSIS — F321 Major depressive disorder, single episode, moderate: Secondary | ICD-10-CM | POA: Diagnosis not present

## 2022-11-12 DIAGNOSIS — I5022 Chronic systolic (congestive) heart failure: Secondary | ICD-10-CM

## 2022-11-12 MED ORDER — METOPROLOL SUCCINATE ER 50 MG PO TB24: 50.0000 mg | ORAL_TABLET | Freq: Every day | ORAL | 3 refills | Status: DC

## 2022-11-12 MED ORDER — DULAGLUTIDE 1.5 MG/0.5ML ~~LOC~~ SOAJ
1.5000 mg | SUBCUTANEOUS | 5 refills | Status: DC
Start: 2022-11-12 — End: 2023-02-12

## 2022-11-12 MED ORDER — GABAPENTIN 300 MG PO CAPS: ORAL_CAPSULE | ORAL | 1 refills | Status: DC

## 2022-11-12 MED ORDER — LISINOPRIL 20 MG PO TABS: 20.0000 mg | ORAL_TABLET | Freq: Every day | ORAL | 3 refills | Status: DC

## 2022-11-12 MED ORDER — ALENDRONATE SODIUM 70 MG PO TABS
70.0000 mg | ORAL_TABLET | ORAL | 3 refills | Status: DC
Start: 2022-11-12 — End: 2023-08-21

## 2022-11-12 MED ORDER — VENLAFAXINE HCL ER 37.5 MG PO CP24: 37.5000 mg | ORAL_CAPSULE | Freq: Every day | ORAL | 3 refills | Status: DC

## 2022-11-12 MED ORDER — TRAMADOL HCL 50 MG PO TABS
ORAL_TABLET | ORAL | 2 refills | Status: DC
Start: 2022-11-12 — End: 2023-02-12

## 2022-11-12 NOTE — Progress Notes (Unsigned)
[facility] [address] [place], [place] [address]  Internal MEDICINE  Office Visit Note  Patient Name: Diana Bernard  [id number]  [id number]  Date of Service: 11/12/2022  Chief Complaint  Patient presents with   Depression   Gastroesophageal Reflux   Hypertension   Medicare Wellness    HPI Diana Bernard presents for an annual well visit and physical exam.  Well-appearing 77 y.o. female with hypertension, aortic atherosclerosis, heart failure, PAD, OSA, diverticulosis, chronic low back pain, osteoporosis, hypothyroidism, depression, and history of uterine cancer and colon cancer.  Labs: has had many labs done in the past several months, will discuss additional labs at her next office visit.  New or worsening pain: none Other concerns:  Patient has decided that she does not want any more mammograms, colonoscopies, or imaging related to her prior colon cancer. Walks with a walker Does tire out quickly so she tends to use a wheelchair at home but uses a cane outside of the home.       11/12/2022    2:56 PM 11/06/2021    3:08 PM 10/24/2020    9:14 AM  MMSE - Mini Mental State Exam  Orientation to time 5 5 5   Orientation to Place 5 5 5   Registration 3 3 3   Attention/ Calculation 0 5 5  Recall 3 3 3   Language- name 2 objects 2 2 2   Language- repeat 1 1 1   Language- follow 3 step command 3 3 3   Language- read & follow direction 1 1 1   Write a sentence 0 1 1  Copy design 1 1 1   Total score 24 30 30     Functional Status Survey: Is the patient deaf or have difficulty hearing?: Yes Does the patient have difficulty seeing, even when wearing glasses/contacts?: Yes Does the patient have difficulty concentrating, remembering, or making decisions?: No Does the patient have difficulty walking or climbing stairs?: Yes Does the patient have difficulty dressing or bathing?: No Does the patient have difficulty doing errands alone such as visiting a doctor's office or  shopping?: No     02/05/2022    8:13 AM 05/09/2022    9:09 AM 06/25/2022    3:58 PM 08/24/2022   10:08 AM 11/12/2022    2:54 PM  Fall Risk  Falls in the past year? 0 0 0 0 0  Was there an injury with Fall? 0  0  0  Fall Risk Category Calculator 0  0  0  Fall Risk Category (Retired) Low      (RETIRED) Patient Fall Risk Level Moderate fall risk Low fall risk     Patient at Risk for Falls Due to No Fall Risks No Fall Risks No Fall Risks  No Fall Risks  Fall risk Follow up Falls evaluation completed Falls evaluation completed Falls evaluation completed  Falls evaluation completed       11/12/2022    2:54 PM  Depression screen PHQ 2/9  Decreased Interest 0  Down, Depressed, Hopeless 0  PHQ - 2 Score 0      Current Medication: Outpatient Encounter Medications as of 11/12/2022  Medication Sig   acetaminophen (TYLENOL) 500 MG tablet Take 500 mg by mouth every 8 (eight) hours. Take 2 tablets by mouth every 8 hours.   albuterol (VENTOLIN HFA) 108 (90 Base) MCG/ACT inhaler Inhale 2 puffs into the lungs every 6 (six) hours as needed for wheezing or shortness of breath.   allopurinol (ZYLOPRIM) 100 MG tablet TAKE 1 TABLET  EVERY DAY   cholecalciferol (VITAMIN D) 1000 units tablet Take 1,000 Units by mouth daily.   clotrimazole (LOTRIMIN) 1 % cream Apply 1 Application topically 2 (two) times daily. Apply topically 2x daily.   colchicine 0.6 MG tablet TAKE 1 TABLET EVERY DAY AS NEEDED FOR GOUT FLARE   Cyanocobalamin (VITAMIN B-12 PO) Take by mouth. Pt takes [place] gummies   diclofenac Sodium (VOLTAREN) 1 % GEL Apply topically 4 (four) times daily.   Dulaglutide 1.5 MG/0.5ML SOPN Inject 1.5 mg into the skin once a week.   esomeprazole (NEXIUM) 20 MG capsule Take 20 mg by mouth daily at 12 noon.   fluticasone-salmeterol (ADVAIR) 500-50 MCG/ACT AEPB Inhale 1 puff into the lungs in the morning and at bedtime.   furosemide (LASIX) 20 MG tablet TAKE 1 TABLET EVERY DAY   ipratropium-albuterol  (DUONEB) 0.5-2.5 (3) MG/3ML SOLN Take 3 mLs by nebulization every 4 (four) hours as needed.   lidocaine 4 % Place 1 patch onto the skin daily.   loratadine (CLARITIN) 10 MG tablet Take 1 tablet (10 mg total) by mouth daily.   magnesium oxide (MAG-OX) 400 (240 Mg) MG tablet Take 400 mg by mouth 2 (two) times daily.   montelukast (SINGULAIR) 10 MG tablet Take 1 tablet (10 mg total) by mouth at bedtime.   Multiple Vitamin (MULTIVITAMIN) capsule Take 1 capsule by mouth daily.   nitrofurantoin, macrocrystal-monohydrate, (MACROBID) 100 MG capsule TAKE 1 CAPSULE EVERY MONDAY, WEDNESDAY, AND FRIDAY   nystatin (MYCOSTATIN/NYSTOP) powder Apply 1 Application topically daily as needed (stoma rash/irritation).   ondansetron (ZOFRAN-ODT) 4 MG disintegrating tablet Take 1 tablet (4 mg total) by mouth every 8 (eight) hours as needed for nausea or vomiting.   potassium chloride SA (KLOR-CON M) 20 MEQ tablet TAKE 1 TABLET EVERY DAY   rosuvastatin (CRESTOR) 10 MG tablet TAKE 1 TABLET TWICE WEEKLY   [DISCONTINUED] alendronate (FOSAMAX) 70 MG tablet Take 1 tablet (70 mg total) by mouth every 7 (seven) days. Take with a full glass of water on an empty stomach.   [DISCONTINUED] benzonatate (TESSALON) 200 MG capsule Take 1 capsule (200 mg total) by mouth 2 (two) times daily as needed for cough.   [DISCONTINUED] Dulaglutide 0.75 MG/0.5ML SOPN Inject 0.75 mg into the skin once a week.   [DISCONTINUED] gabapentin (NEURONTIN) 300 MG capsule Take 1 capsule by mouth in morning, 1 capsule at midday and  3 capsule at bedtime   [DISCONTINUED] lisinopril (ZESTRIL) 20 MG tablet Take 1 tablet (20 mg total) by mouth daily.   [DISCONTINUED] metoprolol succinate (TOPROL-XL) 50 MG 24 hr tablet Take 1 tablet (50 mg total) by mouth daily. Take with or immediately following a meal.   [DISCONTINUED] traMADol (ULTRAM) 50 MG tablet Take one tab po bid for pain prn   [DISCONTINUED] venlafaxine XR (EFFEXOR-XR) 37.5 MG 24 hr capsule TAKE 1  CAPSULE EVERY DAY   alendronate (FOSAMAX) 70 MG tablet Take 1 tablet (70 mg total) by mouth every 7 (seven) days. Take with a full glass of water on an empty stomach.   gabapentin (NEURONTIN) 300 MG capsule Take 1 capsule by mouth in morning, 1 capsule at midday and  3 capsule at bedtime   lisinopril (ZESTRIL) 20 MG tablet Take 1 tablet (20 mg total) by mouth daily.   metoprolol succinate (TOPROL-XL) 50 MG 24 hr tablet Take 1 tablet (50 mg total) by mouth daily. Take with or immediately following a meal.   traMADol (ULTRAM) 50 MG tablet Take one tab po  bid for pain prn   venlafaxine XR (EFFEXOR-XR) 37.5 MG 24 hr capsule Take 1 capsule (37.5 mg total) by mouth daily.   No facility-administered encounter medications on file as of 11/12/2022.    Surgical History: Past Surgical History:  Procedure Laterality Date   ABCESS DRAINAGE  2016   Abdominal abcess due to diverticulitis   caridoverter defibrillator  11/14/2017   ICD   CATARACT EXTRACTION W/PHACO Left 10/26/2015   Procedure: CATARACT EXTRACTION PHACO AND INTRAOCULAR LENS PLACEMENT (IOC) LEFT EYE;  Surgeon: Lockie Mola, MD;  Location: [facility];  Service: Ophthalmology;  Laterality: Left;   CATARACT EXTRACTION W/PHACO Right 12/07/2015   Procedure: CATARACT EXTRACTION PHACO AND INTRAOCULAR LENS PLACEMENT (IOC);  Surgeon: Lockie Mola, MD;  Location: [facility];  Service: Ophthalmology;  Laterality: Right;   CHOLECYSTECTOMY     FOOT SURGERY     Per patient, bilateral foot surgery.   OSTOMY  01/25/2021   OSTOMY  02/11/2021   PELVIC FRACTURE SURGERY     Per patient for cancer.    Medical History: Past Medical History:  Diagnosis Date   Arthritis    Asthma    Cancer (HCC)    Deep vein thrombosis (DVT) (HCC) 2015   Depression    GERD (gastroesophageal reflux disease)    Hypertension    Weakness of both legs     Family History: Family History  Problem Relation Age of Onset   Depression Sister     Heart disease Sister    Early death Sister    Depression Sister    Depression Sister    Depression Sister    Depression Sister     Social History   Socioeconomic History   Marital status: Married    Spouse name: Not on file   Number of children: Not on file   Years of education: Not on file   Highest education level: Not on file  Occupational History   Not on file  Tobacco Use   Smoking status: Never   Smokeless tobacco: Never  Vaping Use   Vaping status: Never Used  Substance and Sexual Activity   Alcohol use: No   Drug use: No   Sexual activity: Not Currently  Other Topics Concern   Not on file  Social History Narrative   Not on file   Social Determinants of Health   Financial Resource Strain: Low Risk  (08/22/2021)   Received from [facility], [facility]   Overall Financial Resource Strain (CARDIA)    Difficulty of Paying Living Expenses: Not very hard  Food Insecurity: No Food Insecurity (08/22/2021)   Received from [facility], [facility]   Hunger Vital Sign    Worried About Running Out of Food in the Last Year: Never true    Ran Out of Food in the Last Year: Never true  Transportation Needs: No Transportation Needs (08/22/2021)   Received from [facility], [facility]   [facility] - Transportation    Lack of Transportation (Medical): No    Lack of Transportation (Non-Medical): No  Physical Activity: Not on file  Stress: Not on file  Social Connections: Not on file  Intimate Partner Violence: Not on file      Review of Systems  Constitutional:  Negative for activity change, appetite change, chills, fatigue, fever and unexpected weight change.  HENT: Negative.  Negative for congestion, ear pain, rhinorrhea, sore throat and trouble swallowing.   Eyes: Negative.   Respiratory:  Negative.  Negative for cough, chest tightness, shortness of breath and wheezing.   Cardiovascular: Negative.  Negative for chest pain and palpitations.   Gastrointestinal: Negative.  Negative for abdominal pain, blood in stool, constipation, diarrhea, nausea and vomiting.  Endocrine: Negative.   Genitourinary: Negative.  Negative for difficulty urinating, dysuria, frequency, hematuria and urgency.  Musculoskeletal:  Positive for gait problem (walking with a cane when she is not at home. uses her wheelchair at home due to being easily fatigued.). Negative for arthralgias, back pain, joint swelling, myalgias and neck pain.  Skin: Negative.  Negative for rash and wound.  Allergic/Immunologic: Negative.  Negative for immunocompromised state.  Neurological:  Negative for dizziness, seizures, numbness and headaches.  Hematological: Negative.   Psychiatric/Behavioral:  Negative for behavioral problems, self-injury and suicidal ideas. The patient is not nervous/anxious.     Vital Signs: BP 133/89   Pulse 70   Temp 98.4 F (36.9 C)   Resp 16   Ht 5' (1.524 m)   Wt 249 lb 3.2 oz (113 kg)   SpO2 95%   BMI 48.67 kg/m    Physical Exam Vitals reviewed.  Constitutional:      General: She is awake. She is not in acute distress.    Appearance: Normal appearance. She is well-developed and well-groomed. She is obese. She is not ill-appearing.  HENT:     Head: Normocephalic and atraumatic.     Right Ear: Tympanic membrane, ear canal and external ear normal.     Left Ear: Tympanic membrane, ear canal and external ear normal.     Nose: Nose normal.     Mouth/Throat:     Mouth: Mucous membranes are moist.     Pharynx: Oropharynx is clear. No posterior oropharyngeal erythema.  Eyes:     Extraocular Movements: Extraocular movements intact.     Pupils: Pupils are equal, round, and reactive to light.  Cardiovascular:     Rate and Rhythm: Normal rate and regular rhythm.     Pulses: Normal pulses.     Heart sounds: Normal heart sounds.  Pulmonary:     Effort: Pulmonary effort is normal.     Breath sounds: Normal breath sounds.  Abdominal:      General: The ostomy site is clean. Bowel sounds are normal. There is no distension.     Palpations: Abdomen is soft.     Tenderness: There is no abdominal tenderness.     Hernia: No hernia is present.     Comments: Colostomy device in place, clean dry and intact, area of stoma that is visible through ostomy back is normal, pink, no erythema, swelling or irritation noted.   Musculoskeletal:     Right lower leg: No edema.     Left lower leg: No edema.  Lymphadenopathy:     Cervical: No cervical adenopathy.  Skin:    General: Skin is warm and dry.     Capillary Refill: Capillary refill takes less than 2 seconds.  Neurological:     General: No focal deficit present.     Mental Status: She is alert and oriented to person, place, and time.     Gait: Gait abnormal (improving, walks with a cane).  Psychiatric:        Mood and Affect: Mood normal.        Behavior: Behavior normal. Behavior is cooperative.        Assessment/Plan: 1. Encounter for routine adult health examination with abnormal findings Age-appropriate preventive screenings and vaccinations discussed,  annual physical exam completed. Routine labs for health maintenance deferred. PHM updated.   2. Essential hypertension Continue metoprolol and lisinopril as prescribed.  - metoprolol succinate (TOPROL-XL) 50 MG 24 hr tablet; Take 1 tablet (50 mg total) by mouth daily. Take with or immediately following a meal.  Dispense: 90 tablet; Refill: 3 - lisinopril (ZESTRIL) 20 MG tablet; Take 1 tablet (20 mg total) by mouth daily.  Dispense: 90 tablet; Refill: 3  3. Prediabetes Dulaglutide dose increased to 1.5 mg. Continue medication as prescribed.  - Dulaglutide 1.5 MG/0.5ML SOPN; Inject 1.5 mg into the skin once a week.  Dispense: 2 mL; Refill: 5  4. Neuropathy due to chemotherapeutic drug (HCC) Continue gabapentin as prescribed.  - gabapentin (NEURONTIN) 300 MG capsule; Take 1 capsule by mouth in morning, 1 capsule at midday and   3 capsule at bedtime  Dispense: 450 capsule; Refill: 1  5. Chronic bilateral low back pain with bilateral sciatica Continue prn tramadol as prescribed - traMADol (ULTRAM) 50 MG tablet; Take one tab po bid for pain prn  Dispense: 60 tablet; Refill: 2  6. Age-related osteoporosis without current pathological fracture Continue alendronate as prescribed.  - alendronate (FOSAMAX) 70 MG tablet; Take 1 tablet (70 mg total) by mouth every 7 (seven) days. Take with a full glass of water on an empty stomach.  Dispense: 12 tablet; Refill: 3  7. Moderate major depression (HCC) Continue venlafaxine as prescribed.  - venlafaxine XR (EFFEXOR-XR) 37.5 MG 24 hr capsule; Take 1 capsule (37.5 mg total) by mouth daily.  Dispense: 90 capsule; Refill: 3     General Counseling: Diana Bernard verbalizes understanding of the findings of todays visit and agrees with plan of treatment. I have discussed any further diagnostic evaluation that may be needed or ordered today. We also reviewed her medications today. she has been encouraged to call the office with any questions or concerns that should arise related to todays visit.    No orders of the defined types were placed in this encounter.   Meds ordered this encounter  Medications   Dulaglutide 1.5 MG/0.5ML SOPN    Sig: Inject 1.5 mg into the skin once a week.    Dispense:  2 mL    Refill:  5    No increased dose.   venlafaxine XR (EFFEXOR-XR) 37.5 MG 24 hr capsule    Sig: Take 1 capsule (37.5 mg total) by mouth daily.    Dispense:  90 capsule    Refill:  3   traMADol (ULTRAM) 50 MG tablet    Sig: Take one tab po bid for pain prn    Dispense:  60 tablet    Refill:  2   metoprolol succinate (TOPROL-XL) 50 MG 24 hr tablet    Sig: Take 1 tablet (50 mg total) by mouth daily. Take with or immediately following a meal.    Dispense:  90 tablet    Refill:  3   lisinopril (ZESTRIL) 20 MG tablet    Sig: Take 1 tablet (20 mg total) by mouth daily.    Dispense:  90  tablet    Refill:  3   gabapentin (NEURONTIN) 300 MG capsule    Sig: Take 1 capsule by mouth in morning, 1 capsule at midday and  3 capsule at bedtime    Dispense:  450 capsule    Refill:  1    For future refills   alendronate (FOSAMAX) 70 MG tablet    Sig: Take 1 tablet (70 mg  total) by mouth every 7 (seven) days. Take with a full glass of water on an empty stomach.    Dispense:  12 tablet    Refill:  3    Return in about 3 months (around 02/12/2023) for F/U, Ameka Krigbaum PCP.   Total time spent:30 Minutes Time spent includes review of chart, medications, test results, and follow up plan with the patient.   [place] Controlled Substance Database was reviewed by me.  This patient was seen by Sallyanne Kuster, FNP-C in collaboration with Dr. Beverely Risen as a part of collaborative care agreement.  Kirin Pastorino R. Tedd Sias, MSN, FNP-C Internal medicine

## 2022-11-13 ENCOUNTER — Encounter: Payer: Self-pay | Admitting: Nurse Practitioner

## 2022-11-19 DIAGNOSIS — E119 Type 2 diabetes mellitus without complications: Secondary | ICD-10-CM | POA: Diagnosis not present

## 2022-11-19 DIAGNOSIS — I5022 Chronic systolic (congestive) heart failure: Secondary | ICD-10-CM | POA: Diagnosis not present

## 2022-11-21 ENCOUNTER — Telehealth: Payer: Self-pay | Admitting: Physician Assistant

## 2022-11-21 NOTE — Telephone Encounter (Signed)
Lvm notifying patient of CT appointment date, arrival time, location and to schedule f/u for results-Toni

## 2022-11-25 ENCOUNTER — Other Ambulatory Visit: Payer: Self-pay | Admitting: Nurse Practitioner

## 2022-11-25 DIAGNOSIS — F321 Major depressive disorder, single episode, moderate: Secondary | ICD-10-CM

## 2022-12-05 ENCOUNTER — Ambulatory Visit
Admission: RE | Admit: 2022-12-05 | Discharge: 2022-12-05 | Disposition: A | Discharge status: Home / Self Care | Payer: Medicare HMO | Source: Ambulatory Visit | Attending: Physician Assistant | Admitting: Physician Assistant

## 2022-12-05 DIAGNOSIS — C20 Malignant neoplasm of rectum: Secondary | ICD-10-CM | POA: Diagnosis not present

## 2022-12-05 DIAGNOSIS — R9389 Abnormal findings on diagnostic imaging of other specified body structures: Secondary | ICD-10-CM | POA: Diagnosis not present

## 2022-12-05 DIAGNOSIS — C55 Malignant neoplasm of uterus, part unspecified: Secondary | ICD-10-CM | POA: Diagnosis not present

## 2022-12-05 DIAGNOSIS — R591 Generalized enlarged lymph nodes: Secondary | ICD-10-CM | POA: Diagnosis not present

## 2022-12-17 DIAGNOSIS — S31829A Unspecified open wound of left buttock, initial encounter: Secondary | ICD-10-CM | POA: Diagnosis not present

## 2022-12-17 DIAGNOSIS — Z433 Encounter for attention to colostomy: Secondary | ICD-10-CM | POA: Diagnosis not present

## 2022-12-17 DIAGNOSIS — S31101A Unspecified open wound of abdominal wall, left upper quadrant without penetration into peritoneal cavity, initial encounter: Secondary | ICD-10-CM | POA: Diagnosis not present

## 2022-12-17 DIAGNOSIS — Z933 Colostomy status: Secondary | ICD-10-CM | POA: Diagnosis not present

## 2023-01-01 ENCOUNTER — Telehealth: Payer: Self-pay

## 2023-01-01 ENCOUNTER — Other Ambulatory Visit: Payer: Self-pay

## 2023-01-01 DIAGNOSIS — N390 Urinary tract infection, site not specified: Secondary | ICD-10-CM

## 2023-01-01 MED ORDER — AMOXICILLIN-POT CLAVULANATE 875-125 MG PO TABS: 1.0000 | ORAL_TABLET | Freq: Two times a day (BID) | ORAL | 0 refills | Status: DC

## 2023-01-01 NOTE — Telephone Encounter (Signed)
Pt called that she having yellow mucus and coughing last 10 days and no other symptoms as per alyssa sent amoxicillin for 10 days

## 2023-01-05 DIAGNOSIS — Z95810 Presence of automatic (implantable) cardiac defibrillator: Secondary | ICD-10-CM | POA: Diagnosis not present

## 2023-01-05 DIAGNOSIS — Z4502 Encounter for adjustment and management of automatic implantable cardiac defibrillator: Secondary | ICD-10-CM | POA: Diagnosis not present

## 2023-01-21 ENCOUNTER — Ambulatory Visit (INDEPENDENT_AMBULATORY_CARE_PROVIDER_SITE_OTHER): Payer: Medicare HMO | Admitting: Nurse Practitioner

## 2023-01-21 ENCOUNTER — Encounter: Payer: Self-pay | Admitting: Nurse Practitioner

## 2023-01-21 VITALS — BP 125/70 | HR 71 | Temp 98.3°F | Resp 16 | Ht 60.0 in | Wt 249.2 lb

## 2023-01-21 DIAGNOSIS — J0100 Acute maxillary sinusitis, unspecified: Secondary | ICD-10-CM

## 2023-01-21 DIAGNOSIS — I10 Essential (primary) hypertension: Secondary | ICD-10-CM

## 2023-01-21 DIAGNOSIS — G8929 Other chronic pain: Secondary | ICD-10-CM | POA: Diagnosis not present

## 2023-01-21 DIAGNOSIS — M5442 Lumbago with sciatica, left side: Secondary | ICD-10-CM

## 2023-01-21 DIAGNOSIS — M5441 Lumbago with sciatica, right side: Secondary | ICD-10-CM

## 2023-01-21 DIAGNOSIS — R0602 Shortness of breath: Secondary | ICD-10-CM | POA: Diagnosis not present

## 2023-01-21 MED ORDER — CYCLOBENZAPRINE HCL 5 MG PO TABS
5.0000 mg | ORAL_TABLET | Freq: Three times a day (TID) | ORAL | 3 refills | Status: DC | PRN
Start: 2023-01-21 — End: 2023-08-05

## 2023-01-21 MED ORDER — PREDNISONE 10 MG (21) PO TBPK
ORAL_TABLET | ORAL | 0 refills | Status: DC
Start: 2023-01-21 — End: 2023-02-12

## 2023-01-21 MED ORDER — HYDROCODONE-ACETAMINOPHEN 5-325 MG PO TABS
1.0000 | ORAL_TABLET | Freq: Four times a day (QID) | ORAL | 0 refills | Status: DC | PRN
Start: 2023-01-21 — End: 2023-05-01

## 2023-01-21 MED ORDER — AZITHROMYCIN 250 MG PO TABS
250.0000 mg | ORAL_TABLET | Freq: Every day | ORAL | 0 refills | Status: AC
Start: 2023-01-21 — End: 2023-01-31

## 2023-01-21 MED ORDER — MELOXICAM 15 MG PO TABS
15.0000 mg | ORAL_TABLET | Freq: Every day | ORAL | 5 refills | Status: DC
Start: 2023-01-21 — End: 2023-08-22

## 2023-01-21 NOTE — Progress Notes (Signed)
[facility] [address] [place], [place] [address]  Internal MEDICINE  Office Visit Note  Patient Name: Diana Bernard  [id number]  [id number]  Date of Service: 01/21/2023  Chief Complaint  Patient presents with   Acute Visit    Back and leg pain, Tramadol not working     HPI Durene presents for an acute sick visit for chronic back and leg pain and sinus infection  Chronic back and leg pain, has been taking tramadol but this does not seem to be controlling the pain as well anymore.  Reports lingering sinus infection, did not fully resolve with augmentin a few weeks ago. Reports chest congestion, cough, sputum production that is yellow, sinus pressure sore throat, hoarseness.      Current Medication:  Outpatient Encounter Medications as of 01/21/2023  Medication Sig   acetaminophen (TYLENOL) 500 MG tablet Take 500 mg by mouth every 8 (eight) hours. Take 2 tablets by mouth every 8 hours.   albuterol (VENTOLIN HFA) 108 (90 Base) MCG/ACT inhaler Inhale 2 puffs into the lungs every 6 (six) hours as needed for wheezing or shortness of breath.   alendronate (FOSAMAX) 70 MG tablet Take 1 tablet (70 mg total) by mouth every 7 (seven) days. Take with a full glass of water on an empty stomach.   allopurinol (ZYLOPRIM) 100 MG tablet TAKE 1 TABLET EVERY DAY   amoxicillin-clavulanate (AUGMENTIN) 875-125 MG tablet Take 1 tablet by mouth 2 (two) times daily.   azithromycin (ZITHROMAX) 250 MG tablet Take 1 tablet (250 mg total) by mouth daily for 10 days.   cholecalciferol (VITAMIN D) 1000 units tablet Take 1,000 Units by mouth daily.   clotrimazole (LOTRIMIN) 1 % cream Apply 1 Application topically 2 (two) times daily. Apply topically 2x daily.   colchicine 0.6 MG tablet TAKE 1 TABLET EVERY DAY AS NEEDED FOR GOUT FLARE   Cyanocobalamin (VITAMIN B-12 PO) Take by mouth. Pt takes [place] gummies   cyclobenzaprine (FLEXERIL) 5 MG tablet Take 1 tablet (5 mg total) by mouth 3  (three) times daily as needed for muscle spasms.   diclofenac Sodium (VOLTAREN) 1 % GEL Apply topically 4 (four) times daily.   Dulaglutide 1.5 MG/0.5ML SOPN Inject 1.5 mg into the skin once a week.   esomeprazole (NEXIUM) 20 MG capsule Take 20 mg by mouth daily at 12 noon.   fluticasone-salmeterol (ADVAIR) 500-50 MCG/ACT AEPB Inhale 1 puff into the lungs in the morning and at bedtime.   furosemide (LASIX) 20 MG tablet TAKE 1 TABLET EVERY DAY   gabapentin (NEURONTIN) 300 MG capsule Take 1 capsule by mouth in morning, 1 capsule at midday and  3 capsule at bedtime   HYDROcodone-acetaminophen (NORCO/VICODIN) 5-325 MG tablet Take 1 tablet by mouth every 6 (six) hours as needed for severe pain.   ipratropium-albuterol (DUONEB) 0.5-2.5 (3) MG/3ML SOLN Take 3 mLs by nebulization every 4 (four) hours as needed.   lidocaine 4 % Place 1 patch onto the skin daily.   lisinopril (ZESTRIL) 20 MG tablet Take 1 tablet (20 mg total) by mouth daily.   loratadine (CLARITIN) 10 MG tablet Take 1 tablet (10 mg total) by mouth daily.   magnesium oxide (MAG-OX) 400 (240 Mg) MG tablet Take 400 mg by mouth 2 (two) times daily.   meloxicam (MOBIC) 15 MG tablet Take 1 tablet (15 mg total) by mouth daily. In am with breakfast   metoprolol succinate (TOPROL-XL) 50 MG 24 hr tablet Take 1 tablet (50 mg total) by  mouth daily. Take with or immediately following a meal.   montelukast (SINGULAIR) 10 MG tablet Take 1 tablet (10 mg total) by mouth at bedtime.   Multiple Vitamin (MULTIVITAMIN) capsule Take 1 capsule by mouth daily.   nystatin (MYCOSTATIN/NYSTOP) powder Apply 1 Application topically daily as needed (stoma rash/irritation).   ondansetron (ZOFRAN-ODT) 4 MG disintegrating tablet Take 1 tablet (4 mg total) by mouth every 8 (eight) hours as needed for nausea or vomiting.   potassium chloride SA (KLOR-CON M) 20 MEQ tablet TAKE 1 TABLET EVERY DAY   predniSONE (STERAPRED UNI-PAK 21 TAB) 10 MG (21) TBPK tablet Use as directed  for 6 days   rosuvastatin (CRESTOR) 10 MG tablet TAKE 1 TABLET TWICE WEEKLY   traMADol (ULTRAM) 50 MG tablet Take one tab po bid for pain prn   venlafaxine XR (EFFEXOR-XR) 37.5 MG 24 hr capsule TAKE 1 CAPSULE EVERY DAY   No facility-administered encounter medications on file as of 01/21/2023.      Medical History: Past Medical History:  Diagnosis Date   Arthritis    Asthma    Cancer (HCC)    Deep vein thrombosis (DVT) (HCC) 2015   Depression    GERD (gastroesophageal reflux disease)    Hypertension    Weakness of both legs      Vital Signs: BP 125/70 Comment: 160/107  Pulse 71   Temp 98.3 F (36.8 C)   Resp 16   Ht 5' (1.524 m)   Wt 249 lb 3.2 oz (113 kg)   SpO2 94%   BMI 48.67 kg/m    Review of Systems  Constitutional:  Positive for fatigue.  HENT:  Positive for congestion, postnasal drip, rhinorrhea, sinus pressure, sinus pain, sore throat and voice change.   Respiratory:  Positive for cough, chest tightness, shortness of breath and wheezing.   Cardiovascular: Negative.  Negative for chest pain and palpitations.  Musculoskeletal:  Positive for arthralgias and back pain.  Neurological:  Positive for headaches.    Physical Exam Vitals reviewed.  Constitutional:      General: She is not in acute distress.    Appearance: Normal appearance. She is obese. She is ill-appearing.  HENT:     Head: Normocephalic and atraumatic.     Right Ear: Tympanic membrane, ear canal and external ear normal.     Left Ear: Tympanic membrane, ear canal and external ear normal.     Nose: Congestion and rhinorrhea present.     Mouth/Throat:     Mouth: Mucous membranes are moist.     Pharynx: Posterior oropharyngeal erythema present.  Eyes:     Pupils: Pupils are equal, round, and reactive to light.  Cardiovascular:     Rate and Rhythm: Normal rate and regular rhythm.     Heart sounds: Normal heart sounds. No murmur heard. Pulmonary:     Effort: Pulmonary effort is normal. No  respiratory distress.     Breath sounds: Normal breath sounds.  Neurological:     Mental Status: She is alert and oriented to person, place, and time.  Psychiatric:        Mood and Affect: Mood normal.        Behavior: Behavior normal.       Assessment/Plan: 1. Chronic bilateral low back pain with bilateral sciatica Discontinue tramadol, start meloxicam 15 mg daily and take cyclobenzaprine as needed for muscle spasms. Hydrocodone prescribed for severe pain only.  - meloxicam (MOBIC) 15 MG tablet; Take 1 tablet (15 mg total) by mouth  daily. In am with breakfast  Dispense: 30 tablet; Refill: 5 - cyclobenzaprine (FLEXERIL) 5 MG tablet; Take 1 tablet (5 mg total) by mouth 3 (three) times daily as needed for muscle spasms.  Dispense: 90 tablet; Refill: 3 - HYDROcodone-acetaminophen (NORCO/VICODIN) 5-325 MG tablet; Take 1 tablet by mouth every 6 (six) hours as needed for severe pain.  Dispense: 20 tablet; Refill: 0  2. Acute non-recurrent maxillary sinusitis Zpak and prednisone taper prescribed  - azithromycin (ZITHROMAX) 250 MG tablet; Take 1 tablet (250 mg total) by mouth daily for 10 days.  Dispense: 10 tablet; Refill: 0 - predniSONE (STERAPRED UNI-PAK 21 TAB) 10 MG (21) TBPK tablet; Use as directed for 6 days  Dispense: 21 tablet; Refill: 0  3. SOB (shortness of breath) Prednisone taper prescribed  - predniSONE (STERAPRED UNI-PAK 21 TAB) 10 MG (21) TBPK tablet; Use as directed for 6 days  Dispense: 21 tablet; Refill: 0  4. Essential hypertension Stable, BP rechecked.    General Counseling: Kelicia verbalizes understanding of the findings of todays visit and agrees with plan of treatment. I have discussed any further diagnostic evaluation that may be needed or ordered today. We also reviewed her medications today. she has been encouraged to call the office with any questions or concerns that should arise related to todays visit.    Counseling:    No orders of the defined types  were placed in this encounter.   Meds ordered this encounter  Medications   meloxicam (MOBIC) 15 MG tablet    Sig: Take 1 tablet (15 mg total) by mouth daily. In am with breakfast    Dispense:  30 tablet    Refill:  5    Fill new script today   cyclobenzaprine (FLEXERIL) 5 MG tablet    Sig: Take 1 tablet (5 mg total) by mouth 3 (three) times daily as needed for muscle spasms.    Dispense:  90 tablet    Refill:  3   HYDROcodone-acetaminophen (NORCO/VICODIN) 5-325 MG tablet    Sig: Take 1 tablet by mouth every 6 (six) hours as needed for severe pain.    Dispense:  20 tablet    Refill:  0    First fill, may request refill if needed.   azithromycin (ZITHROMAX) 250 MG tablet    Sig: Take 1 tablet (250 mg total) by mouth daily for 10 days.    Dispense:  10 tablet    Refill:  0   predniSONE (STERAPRED UNI-PAK 21 TAB) 10 MG (21) TBPK tablet    Sig: Use as directed for 6 days    Dispense:  21 tablet    Refill:  0    Return for previously scheduled, F/U, Caydyn Sprung PCP next month.  [place] Controlled Substance Database was reviewed by me for overdose risk score (ORS)  Time spent:30 Minutes Time spent with patient included reviewing progress notes, labs, imaging studies, and discussing plan for follow up.   This patient was seen by Sallyanne Kuster, FNP-C in collaboration with Dr. Beverely Risen as a part of collaborative care agreement.  Stiven Kaspar R. Tedd Sias, MSN, FNP-C Internal Medicine

## 2023-01-22 DIAGNOSIS — Z933 Colostomy status: Secondary | ICD-10-CM | POA: Diagnosis not present

## 2023-01-22 DIAGNOSIS — Z433 Encounter for attention to colostomy: Secondary | ICD-10-CM | POA: Diagnosis not present

## 2023-01-22 DIAGNOSIS — S31101A Unspecified open wound of abdominal wall, left upper quadrant without penetration into peritoneal cavity, initial encounter: Secondary | ICD-10-CM | POA: Diagnosis not present

## 2023-01-22 DIAGNOSIS — S31829A Unspecified open wound of left buttock, initial encounter: Secondary | ICD-10-CM | POA: Diagnosis not present

## 2023-02-07 ENCOUNTER — Other Ambulatory Visit: Payer: Self-pay | Admitting: Nurse Practitioner

## 2023-02-07 DIAGNOSIS — E782 Mixed hyperlipidemia: Secondary | ICD-10-CM

## 2023-02-11 DIAGNOSIS — R0602 Shortness of breath: Secondary | ICD-10-CM | POA: Diagnosis not present

## 2023-02-11 DIAGNOSIS — I130 Hypertensive heart and chronic kidney disease with heart failure and stage 1 through stage 4 chronic kidney disease, or unspecified chronic kidney disease: Secondary | ICD-10-CM | POA: Diagnosis not present

## 2023-02-11 DIAGNOSIS — I5022 Chronic systolic (congestive) heart failure: Secondary | ICD-10-CM | POA: Diagnosis not present

## 2023-02-11 DIAGNOSIS — Z6841 Body Mass Index (BMI) 40.0 and over, adult: Secondary | ICD-10-CM | POA: Diagnosis not present

## 2023-02-11 DIAGNOSIS — J449 Chronic obstructive pulmonary disease, unspecified: Secondary | ICD-10-CM | POA: Diagnosis not present

## 2023-02-11 DIAGNOSIS — Z4502 Encounter for adjustment and management of automatic implantable cardiac defibrillator: Secondary | ICD-10-CM | POA: Diagnosis not present

## 2023-02-11 DIAGNOSIS — N189 Chronic kidney disease, unspecified: Secondary | ICD-10-CM | POA: Diagnosis not present

## 2023-02-11 DIAGNOSIS — G4733 Obstructive sleep apnea (adult) (pediatric): Secondary | ICD-10-CM | POA: Diagnosis not present

## 2023-02-12 ENCOUNTER — Telehealth: Payer: Self-pay

## 2023-02-12 ENCOUNTER — Encounter: Payer: Self-pay | Admitting: Nurse Practitioner

## 2023-02-12 ENCOUNTER — Ambulatory Visit (INDEPENDENT_AMBULATORY_CARE_PROVIDER_SITE_OTHER): Payer: Medicare HMO | Admitting: Nurse Practitioner

## 2023-02-12 VITALS — BP 135/88 | HR 68 | Temp 98.4°F | Resp 16 | Ht 60.0 in | Wt 237.8 lb

## 2023-02-12 DIAGNOSIS — Z23 Encounter for immunization: Secondary | ICD-10-CM

## 2023-02-12 DIAGNOSIS — J208 Acute bronchitis due to other specified organisms: Secondary | ICD-10-CM

## 2023-02-12 DIAGNOSIS — J45991 Cough variant asthma: Secondary | ICD-10-CM | POA: Diagnosis not present

## 2023-02-12 DIAGNOSIS — R197 Diarrhea, unspecified: Secondary | ICD-10-CM | POA: Diagnosis not present

## 2023-02-12 DIAGNOSIS — B9689 Other specified bacterial agents as the cause of diseases classified elsewhere: Secondary | ICD-10-CM

## 2023-02-12 DIAGNOSIS — Z79899 Other long term (current) drug therapy: Secondary | ICD-10-CM | POA: Diagnosis not present

## 2023-02-12 MED ORDER — AZITHROMYCIN 250 MG PO TABS
ORAL_TABLET | ORAL | 0 refills | Status: AC
Start: 2023-02-12 — End: 2023-02-17

## 2023-02-12 MED ORDER — DULAGLUTIDE 1.5 MG/0.5ML ~~LOC~~ SOAJ
1.5000 mg | SUBCUTANEOUS | 5 refills | Status: DC
Start: 2023-02-12 — End: 2023-08-22

## 2023-02-12 MED ORDER — TRAMADOL HCL 50 MG PO TABS
ORAL_TABLET | ORAL | 2 refills | Status: DC
Start: 2023-02-12 — End: 2023-05-23

## 2023-02-12 MED ORDER — ZOSTER VAC RECOMB ADJUVANTED 50 MCG/0.5ML IM SUSR
0.5000 mL | Freq: Once | INTRAMUSCULAR | 0 refills | Status: AC
Start: 2023-02-12 — End: 2023-02-12

## 2023-02-12 MED ORDER — PNEUMOCOCCAL 20-VAL CONJ VACC 0.5 ML IM SUSY
0.5000 mL | PREFILLED_SYRINGE | INTRAMUSCULAR | 0 refills | Status: AC
Start: 2023-02-12 — End: 2023-02-12

## 2023-02-12 MED ORDER — GABAPENTIN 300 MG PO CAPS: ORAL_CAPSULE | ORAL | 1 refills | Status: DC

## 2023-02-12 MED ORDER — IPRATROPIUM-ALBUTEROL 0.5-2.5 (3) MG/3ML IN SOLN
3.0000 mL | RESPIRATORY_TRACT | 3 refills | Status: AC | PRN
Start: 2023-02-12 — End: ?

## 2023-02-12 MED ORDER — COLCHICINE 0.6 MG PO TABS
ORAL_TABLET | ORAL | 10 refills | Status: AC
Start: 2023-02-12 — End: ?

## 2023-02-12 MED ORDER — DIPHENOXYLATE-ATROPINE 2.5-0.025 MG PO TABS
1.0000 | ORAL_TABLET | Freq: Four times a day (QID) | ORAL | 0 refills | Status: DC | PRN
Start: 2023-02-12 — End: 2023-05-23

## 2023-02-12 NOTE — Telephone Encounter (Signed)
PA was done and approved for Diphenoxylate-Atropine, Patient daughter was notified.

## 2023-02-12 NOTE — Progress Notes (Unsigned)
[facility] [address] [place], [place] [address]  Internal MEDICINE  Office Visit Note  Patient Name: Diana Bernard  [id number]  [id number]  Date of Service: 02/12/2023  Chief Complaint  Patient presents with  . Depression  . Gastroesophageal Reflux  . Hypertension    HPI Diana Bernard presents for a follow-up visit for bronchitis, diarrhea, refills and vaccines  Bronchitis  -- wheezing, cough, chest tightness, SOB  Diarrhea -- has been going on over the past couple of days, has been taking a lot of immodium Refills --due for multiple refills  Vaccines -- request flu shot today but is also due for multiple other vaccines     Current Medication: Outpatient Encounter Medications as of 02/12/2023  Medication Sig  . acetaminophen (TYLENOL) 500 MG tablet Take 500 mg by mouth every 8 (eight) hours. Take 2 tablets by mouth every 8 hours.  [name] albuterol (VENTOLIN HFA) 108 (90 Base) MCG/ACT inhaler Inhale 2 puffs into the lungs every 6 (six) hours as needed for wheezing or shortness of breath.  [name] alendronate (FOSAMAX) 70 MG tablet Take 1 tablet (70 mg total) by mouth every 7 (seven) days. Take with a full glass of water on an empty stomach.  [name] allopurinol (ZYLOPRIM) 100 MG tablet TAKE 1 TABLET EVERY DAY  . azithromycin (ZITHROMAX) 250 MG tablet Take 2 tablets on day 1, then 1 tablet daily on days 2 through 5  . cholecalciferol (VITAMIN D) 1000 units tablet Take 1,000 Units by mouth daily.  . clotrimazole (LOTRIMIN) 1 % cream Apply 1 Application topically 2 (two) times daily. Apply topically 2x daily.  . Cyanocobalamin (VITAMIN B-12 PO) Take by mouth. Pt takes [facility] [place] gummies  . cyclobenzaprine (FLEXERIL) 5 MG tablet Take 1 tablet (5 mg total) by mouth 3 (three) times daily as needed for muscle spasms.  . diclofenac Sodium (VOLTAREN) 1 % GEL Apply topically 4 (four) times daily.  . diphenoxylate-atropine (LOMOTIL) 2.5-0.025 MG tablet Take 1 tablet by mouth 4 (four)  times daily as needed for diarrhea or loose stools. Alternate with immodium  . esomeprazole (NEXIUM) 20 MG capsule Take 20 mg by mouth daily at 12 noon.  . fluticasone-salmeterol (ADVAIR) 500-50 MCG/ACT AEPB Inhale 1 puff into the lungs in the morning and at bedtime.  . furosemide (LASIX) 20 MG tablet TAKE 1 TABLET EVERY DAY  . HYDROcodone-acetaminophen (NORCO/VICODIN) 5-325 MG tablet Take 1 tablet by mouth every 6 (six) hours as needed for severe pain.  [name] lidocaine 4 % Place 1 patch onto the skin daily.  [name] lisinopril (ZESTRIL) 20 MG tablet Take 1 tablet (20 mg total) by mouth daily.  [name] loratadine (CLARITIN) 10 MG tablet Take 1 tablet (10 mg total) by mouth daily.  . magnesium oxide (MAG-OX) 400 (240 Mg) MG tablet Take 400 mg by mouth 2 (two) times daily.  . meloxicam (MOBIC) 15 MG tablet Take 1 tablet (15 mg total) by mouth daily. In am with breakfast  . metoprolol succinate (TOPROL-XL) 50 MG 24 hr tablet Take 1 tablet (50 mg total) by mouth daily. Take with or immediately following a meal.  . montelukast (SINGULAIR) 10 MG tablet Take 1 tablet (10 mg total) by mouth at bedtime.  . Multiple Vitamin (MULTIVITAMIN) capsule Take 1 capsule by mouth daily.  [name] nystatin (MYCOSTATIN/NYSTOP) powder Apply 1 Application topically daily as needed (stoma rash/irritation).  . ondansetron (ZOFRAN-ODT) 4 MG disintegrating tablet Take 1 tablet (4 mg total) by mouth every 8 (eight) hours as needed for  nausea or vomiting.  . potassium chloride SA (KLOR-CON M) 20 MEQ tablet TAKE 1 TABLET EVERY DAY  . rosuvastatin (CRESTOR) 10 MG tablet TAKE 1 TABLET TWICE WEEKLY  . venlafaxine XR (EFFEXOR-XR) 37.5 MG 24 hr capsule TAKE 1 CAPSULE EVERY DAY  . [DISCONTINUED] amoxicillin-clavulanate (AUGMENTIN) 875-125 MG tablet Take 1 tablet by mouth 2 (two) times daily.  . [DISCONTINUED] colchicine 0.6 MG tablet TAKE 1 TABLET EVERY DAY AS NEEDED FOR GOUT FLARE  . [DISCONTINUED] Dulaglutide 1.5 MG/0.5ML SOPN Inject 1.5 mg into the  skin once a week.  . [DISCONTINUED] gabapentin (NEURONTIN) 300 MG capsule Take 1 capsule by mouth in morning, 1 capsule at midday and  3 capsule at bedtime  . [DISCONTINUED] ipratropium-albuterol (DUONEB) 0.5-2.5 (3) MG/3ML SOLN Take 3 mLs by nebulization every 4 (four) hours as needed.  . [DISCONTINUED] pneumococcal 20-valent conjugate vaccine (PREVNAR 20) 0.5 ML injection Inject 0.5 mLs into the muscle tomorrow at 10 am.  . [DISCONTINUED] predniSONE (STERAPRED UNI-PAK 21 TAB) 10 MG (21) TBPK tablet Use as directed for 6 days  . [DISCONTINUED] traMADol (ULTRAM) 50 MG tablet Take one tab po bid for pain prn  . [DISCONTINUED] Zoster Vaccine Adjuvanted [facility]) injection Inject 0.5 mLs into the muscle once.  . colchicine 0.6 MG tablet TAKE 1 TABLET EVERY DAY AS NEEDED FOR GOUT FLARE  . Dulaglutide 1.5 MG/0.5ML SOPN Inject 1.5 mg into the skin once a week.  . gabapentin (NEURONTIN) 300 MG capsule Take 1 capsule by mouth in morning, 1 capsule at midday and  3 capsule at bedtime  . ipratropium-albuterol (DUONEB) 0.5-2.5 (3) MG/3ML SOLN Take 3 mLs by nebulization every 4 (four) hours as needed.  . pneumococcal 20-valent conjugate vaccine (PREVNAR 20) 0.5 ML injection Inject 0.5 mLs into the muscle tomorrow at 10 am for 1 dose.  . traMADol (ULTRAM) 50 MG tablet Take one tab po bid for pain prn  . Zoster Vaccine Adjuvanted [facility]) injection Inject 0.5 mLs into the muscle once for 1 dose.   No facility-administered encounter medications on file as of 02/12/2023.    Surgical History: Past Surgical History:  Procedure Laterality Date  . ABCESS DRAINAGE  2016   Abdominal abcess due to diverticulitis  . caridoverter defibrillator  11/14/2017   ICD  . CATARACT EXTRACTION W/PHACO Left 10/26/2015   Procedure: CATARACT EXTRACTION PHACO AND INTRAOCULAR LENS PLACEMENT (IOC) LEFT EYE;  Surgeon: Lockie Mola, MD;  Location: [facility];  Service: Ophthalmology;  Laterality: Left;  .  CATARACT EXTRACTION W/PHACO Right 12/07/2015   Procedure: CATARACT EXTRACTION PHACO AND INTRAOCULAR LENS PLACEMENT (IOC);  Surgeon: Lockie Mola, MD;  Location: [facility];  Service: Ophthalmology;  Laterality: Right;  . CHOLECYSTECTOMY    . FOOT SURGERY     Per patient, bilateral foot surgery.  . OSTOMY  01/25/2021  . OSTOMY  02/11/2021  . PELVIC FRACTURE SURGERY     Per patient for cancer.    Medical History: Past Medical History:  Diagnosis Date  . Arthritis   . Asthma   . Cancer (HCC)   . Deep vein thrombosis (DVT) (HCC) 2015  . Depression   . GERD (gastroesophageal reflux disease)   . Hypertension   . Weakness of both legs     Family History: Family History  Problem Relation Age of Onset  . Depression Sister   . Heart disease Sister   . Early death Sister   . Depression Sister   . Depression Sister   . Depression Sister   .  Depression Sister     Social History   Socioeconomic History  . Marital status: Married    Spouse name: Not on file  . Number of children: Not on file  . Years of education: Not on file  . Highest education level: Not on file  Occupational History  . Not on file  Tobacco Use  . Smoking status: Never  . Smokeless tobacco: Never  Vaping Use  . Vaping status: Never Used  Substance and Sexual Activity  . Alcohol use: No  . Drug use: No  . Sexual activity: Not Currently  Other Topics Concern  . Not on file  Social History Narrative  . Not on file   Social Determinants of Health   Financial Resource Strain: Low Risk  (11/15/2022)   Received from [facility]   Overall Financial Resource Strain (CARDIA)   . Difficulty of Paying Living Expenses: Not very hard  Food Insecurity: No Food Insecurity (11/15/2022)   Received from [facility]   Hunger Vital Sign   . Worried About Programme researcher, broadcasting/film/video in the Last Year: Never true   . Ran Out of Food in the Last Year: Never true  Transportation Needs: No Transportation  Needs (11/15/2022)   Received from [facility]   [facility] - Transportation   . Lack of Transportation (Medical): No   . Lack of Transportation (Non-Medical): No  Physical Activity: Not on file  Stress: Not on file  Social Connections: Not on file  Intimate Partner Violence: Not on file      Review of Systems  Constitutional:  Negative for fatigue.  HENT:  Positive for congestion and postnasal drip. Negative for ear pain, rhinorrhea, sinus pressure, sinus pain, sneezing and sore throat.   Respiratory:  Positive for cough and shortness of breath. Negative for chest tightness and wheezing.   Cardiovascular: Negative.  Negative for chest pain and palpitations.  Gastrointestinal:  Negative for abdominal pain, constipation, diarrhea, nausea and vomiting.  Neurological:  Positive for headaches.    Vital Signs: BP (!) 143/90   Pulse 68   Temp 98.4 F (36.9 C)   Resp 16   Ht 5' (1.524 m)   Wt 237 lb 12.8 oz (107.9 kg)   SpO2 98%   BMI 46.44 kg/m    Physical Exam Vitals reviewed.  Constitutional:      General: She is not in acute distress.    Appearance: Normal appearance. She is obese. She is ill-appearing.  HENT:     Head: Normocephalic and atraumatic.     Nose: Congestion and rhinorrhea present.  Eyes:     Pupils: Pupils are equal, round, and reactive to light.  Cardiovascular:     Rate and Rhythm: Normal rate and regular rhythm.  Pulmonary:     Effort: Pulmonary effort is normal. No respiratory distress.     Breath sounds: Examination of the right-upper field reveals wheezing. Examination of the left-upper field reveals wheezing. Examination of the right-lower field reveals decreased breath sounds. Decreased breath sounds and wheezing present.  Neurological:     Mental Status: She is alert and oriented to person, place, and time.  Psychiatric:        Mood and Affect: Mood normal.        Behavior: Behavior normal.       Assessment/Plan: 1. Upper respiratory  tract infection, unspecified type *** - azithromycin (ZITHROMAX) 250 MG tablet; Take 2 tablets on day 1, then 1 tablet daily on days  2 through 5  Dispense: 6 tablet; Refill: 0  2. Cough variant asthma *** - ipratropium-albuterol (DUONEB) 0.5-2.5 (3) MG/3ML SOLN; Take 3 mLs by nebulization every 4 (four) hours as needed.  Dispense: 360 mL; Refill: 3  3. Diarrhea, unspecified type *** - diphenoxylate-atropine (LOMOTIL) 2.5-0.025 MG tablet; Take 1 tablet by mouth 4 (four) times daily as needed for diarrhea or loose stools. Alternate with immodium  Dispense: 30 tablet; Refill: 0  4. Need for vaccination *** - Influenza, MDCK, trivalent, PF(Flucelvax egg-free) - pneumococcal 20-valent conjugate vaccine (PREVNAR 20) 0.5 ML injection; Inject 0.5 mLs into the muscle tomorrow at 10 am for 1 dose.  Dispense: 0.5 mL; Refill: 0 - Zoster Vaccine Adjuvanted [facility]) injection; Inject 0.5 mLs into the muscle once for 1 dose.  Dispense: 0.5 mL; Refill: 0  5. Encounter for medication review *** - Dulaglutide 1.5 MG/0.5ML SOPN; Inject 1.5 mg into the skin once a week.  Dispense: 2 mL; Refill: 5 - traMADol (ULTRAM) 50 MG tablet; Take one tab po bid for pain prn  Dispense: 60 tablet; Refill: 2 - gabapentin (NEURONTIN) 300 MG capsule; Take 1 capsule by mouth in morning, 1 capsule at midday and  3 capsule at bedtime  Dispense: 450 capsule; Refill: 1 - colchicine 0.6 MG tablet; TAKE 1 TABLET EVERY DAY AS NEEDED FOR GOUT FLARE  Dispense: 60 tablet; Refill: 10   General Counseling: Diana Bernard verbalizes understanding of the findings of todays visit and agrees with plan of treatment. I have discussed any further diagnostic evaluation that may be needed or ordered today. We also reviewed her medications today. she has been encouraged to call the office with any questions or concerns that should arise related to todays visit.    Orders Placed This Encounter  Procedures  . Influenza, MDCK, trivalent, PF(Flucelvax  egg-free)    Meds ordered this encounter  Medications  . pneumococcal 20-valent conjugate vaccine (PREVNAR 20) 0.5 ML injection    Sig: Inject 0.5 mLs into the muscle tomorrow at 10 am for 1 dose.    Dispense:  0.5 mL    Refill:  0  . Zoster Vaccine Adjuvanted [facility]) injection    Sig: Inject 0.5 mLs into the muscle once for 1 dose.    Dispense:  0.5 mL    Refill:  0  . ipratropium-albuterol (DUONEB) 0.5-2.5 (3) MG/3ML SOLN    Sig: Take 3 mLs by nebulization every 4 (four) hours as needed.    Dispense:  360 mL    Refill:  3  . Dulaglutide 1.5 MG/0.5ML SOPN    Sig: Inject 1.5 mg into the skin once a week.    Dispense:  2 mL    Refill:  5    No increased dose.  . traMADol (ULTRAM) 50 MG tablet    Sig: Take one tab po bid for pain prn    Dispense:  60 tablet    Refill:  2  . gabapentin (NEURONTIN) 300 MG capsule    Sig: Take 1 capsule by mouth in morning, 1 capsule at midday and  3 capsule at bedtime    Dispense:  450 capsule    Refill:  1    For future refills  . colchicine 0.6 MG tablet    Sig: TAKE 1 TABLET EVERY DAY AS NEEDED FOR GOUT FLARE    Dispense:  60 tablet    Refill:  10  . diphenoxylate-atropine (LOMOTIL) 2.5-0.025 MG tablet    Sig: Take 1 tablet by mouth 4 (four)  times daily as needed for diarrhea or loose stools. Alternate with immodium    Dispense:  30 tablet    Refill:  0    Fill new script today.  [name] azithromycin (ZITHROMAX) 250 MG tablet    Sig: Take 2 tablets on day 1, then 1 tablet daily on days 2 through 5    Dispense:  6 tablet    Refill:  0    Return in about 3 months (around 05/08/2023) for F/U, pain med refill, Sayvon Arterberry PCP.   Total time spent:30 Minutes Time spent includes review of chart, medications, test results, and follow up plan with the patient.   [place] Controlled Substance Database was reviewed by me.  This patient was seen by Sallyanne Kuster, FNP-C in collaboration with Dr. Beverely Risen as a part of collaborative care  agreement.   Diana Bernard R. Tedd Sias, MSN, FNP-C Internal medicine

## 2023-02-13 ENCOUNTER — Encounter: Payer: Self-pay | Admitting: Nurse Practitioner

## 2023-02-14 DIAGNOSIS — I2510 Atherosclerotic heart disease of native coronary artery without angina pectoris: Secondary | ICD-10-CM | POA: Diagnosis not present

## 2023-02-14 DIAGNOSIS — I130 Hypertensive heart and chronic kidney disease with heart failure and stage 1 through stage 4 chronic kidney disease, or unspecified chronic kidney disease: Secondary | ICD-10-CM | POA: Diagnosis not present

## 2023-02-14 DIAGNOSIS — I700 Atherosclerosis of aorta: Secondary | ICD-10-CM | POA: Diagnosis not present

## 2023-02-14 DIAGNOSIS — C20 Malignant neoplasm of rectum: Secondary | ICD-10-CM | POA: Diagnosis not present

## 2023-02-14 DIAGNOSIS — Z90710 Acquired absence of both cervix and uterus: Secondary | ICD-10-CM | POA: Diagnosis not present

## 2023-02-14 DIAGNOSIS — N189 Chronic kidney disease, unspecified: Secondary | ICD-10-CM | POA: Diagnosis not present

## 2023-02-14 DIAGNOSIS — J449 Chronic obstructive pulmonary disease, unspecified: Secondary | ICD-10-CM | POA: Diagnosis not present

## 2023-02-14 DIAGNOSIS — I509 Heart failure, unspecified: Secondary | ICD-10-CM | POA: Diagnosis not present

## 2023-02-14 DIAGNOSIS — Z933 Colostomy status: Secondary | ICD-10-CM | POA: Diagnosis not present

## 2023-02-17 DIAGNOSIS — Z4502 Encounter for adjustment and management of automatic implantable cardiac defibrillator: Secondary | ICD-10-CM | POA: Diagnosis not present

## 2023-02-17 DIAGNOSIS — I5022 Chronic systolic (congestive) heart failure: Secondary | ICD-10-CM | POA: Diagnosis not present

## 2023-02-23 ENCOUNTER — Encounter: Payer: Self-pay | Admitting: Nurse Practitioner

## 2023-02-26 ENCOUNTER — Other Ambulatory Visit: Payer: Self-pay

## 2023-02-26 ENCOUNTER — Other Ambulatory Visit: Payer: Self-pay | Admitting: Internal Medicine

## 2023-02-26 DIAGNOSIS — R609 Edema, unspecified: Secondary | ICD-10-CM

## 2023-02-26 DIAGNOSIS — S31829A Unspecified open wound of left buttock, initial encounter: Secondary | ICD-10-CM | POA: Diagnosis not present

## 2023-02-26 DIAGNOSIS — S31101A Unspecified open wound of abdominal wall, left upper quadrant without penetration into peritoneal cavity, initial encounter: Secondary | ICD-10-CM | POA: Diagnosis not present

## 2023-02-26 DIAGNOSIS — Z933 Colostomy status: Secondary | ICD-10-CM | POA: Diagnosis not present

## 2023-02-26 DIAGNOSIS — Z433 Encounter for attention to colostomy: Secondary | ICD-10-CM | POA: Diagnosis not present

## 2023-02-26 NOTE — Telephone Encounter (Signed)
Sending lab order for her

## 2023-02-26 NOTE — Telephone Encounter (Signed)
Thank you Vanice Sarah!!

## 2023-02-26 NOTE — Telephone Encounter (Signed)
Patient daughter notified and lab order was placed.

## 2023-02-26 NOTE — Telephone Encounter (Signed)
Can you please call pt and get an update

## 2023-02-28 ENCOUNTER — Other Ambulatory Visit: Payer: Self-pay | Admitting: Nurse Practitioner

## 2023-02-28 DIAGNOSIS — Z79899 Other long term (current) drug therapy: Secondary | ICD-10-CM

## 2023-03-25 DIAGNOSIS — R609 Edema, unspecified: Secondary | ICD-10-CM | POA: Diagnosis not present

## 2023-03-26 LAB — BASIC METABOLIC PANEL
BUN/Creatinine Ratio: 16 (ref 12–28)
BUN: 23 mg/dL (ref 8–27)
CO2: 22 mmol/L (ref 20–29)
Calcium: 9.9 mg/dL (ref 8.7–10.3)
Chloride: 99 mmol/L (ref 96–106)
Creatinine, Ser: 1.46 mg/dL — ABNORMAL HIGH (ref 0.57–1.00)
Glucose: 104 mg/dL — ABNORMAL HIGH (ref 70–99)
Potassium: 4.7 mmol/L (ref 3.5–5.2)
Sodium: 136 mmol/L (ref 134–144)
eGFR: 37 mL/min/{1.73_m2} — ABNORMAL LOW (ref >59)

## 2023-03-26 NOTE — Progress Notes (Signed)
Elevtaed creatinine, will discuss on follow up visit

## 2023-04-06 DIAGNOSIS — Z95810 Presence of automatic (implantable) cardiac defibrillator: Secondary | ICD-10-CM | POA: Diagnosis not present

## 2023-04-06 DIAGNOSIS — Z4502 Encounter for adjustment and management of automatic implantable cardiac defibrillator: Secondary | ICD-10-CM | POA: Diagnosis not present

## 2023-04-07 ENCOUNTER — Other Ambulatory Visit: Payer: Self-pay | Admitting: Nurse Practitioner

## 2023-04-08 DIAGNOSIS — S31829A Unspecified open wound of left buttock, initial encounter: Secondary | ICD-10-CM | POA: Diagnosis not present

## 2023-04-08 DIAGNOSIS — S31101A Unspecified open wound of abdominal wall, left upper quadrant without penetration into peritoneal cavity, initial encounter: Secondary | ICD-10-CM | POA: Diagnosis not present

## 2023-04-08 DIAGNOSIS — Z933 Colostomy status: Secondary | ICD-10-CM | POA: Diagnosis not present

## 2023-04-08 DIAGNOSIS — Z433 Encounter for attention to colostomy: Secondary | ICD-10-CM | POA: Diagnosis not present

## 2023-04-09 ENCOUNTER — Encounter: Payer: Self-pay | Admitting: Internal Medicine

## 2023-04-09 ENCOUNTER — Ambulatory Visit (INDEPENDENT_AMBULATORY_CARE_PROVIDER_SITE_OTHER): Payer: Medicare HMO | Admitting: Internal Medicine

## 2023-04-09 VITALS — BP 117/89 | HR 89 | Temp 98.2°F | Resp 16 | Ht 60.0 in | Wt 244.2 lb

## 2023-04-09 DIAGNOSIS — E662 Morbid (severe) obesity with alveolar hypoventilation: Secondary | ICD-10-CM | POA: Diagnosis not present

## 2023-04-09 DIAGNOSIS — I700 Atherosclerosis of aorta: Secondary | ICD-10-CM

## 2023-04-09 DIAGNOSIS — E66813 Obesity, class 3: Secondary | ICD-10-CM

## 2023-04-09 DIAGNOSIS — I5022 Chronic systolic (congestive) heart failure: Secondary | ICD-10-CM

## 2023-04-09 DIAGNOSIS — Z6841 Body Mass Index (BMI) 40.0 and over, adult: Secondary | ICD-10-CM | POA: Diagnosis not present

## 2023-04-09 DIAGNOSIS — G8929 Other chronic pain: Secondary | ICD-10-CM

## 2023-04-09 DIAGNOSIS — R609 Edema, unspecified: Secondary | ICD-10-CM

## 2023-04-09 NOTE — Progress Notes (Signed)
[facility] [address] [place], [place] [address]  Internal MEDICINE  Office Visit Note  Patient Name: Diana Bernard  [id number]  [id number]  Date of Service: 05/16/2023  Chief Complaint  Patient presents with   Follow-up    Review labs   Depression   Gastroesophageal Reflux   Hypertension   Knee Pain    Left knee pain   Quality Metric Gaps    Shingles and Pneumonia vaccines    HPI  Pt is seen with her grand-daughter. Improved BLE, her dose of diuretic was increased  CKD,being monitored closely  2 years s/p APR w/ transverse colostomy on 01/25/21 for a T3N1 M0 rectal carcinoma, course c/b prolonged hospitalization and wound dehiscence and stomal dehiscence requiring laparotomy w/ ostomy relocation (02/14/21) who presents today for a follow up visit. She has previously decided to undergo symptom-driven surveillance only. with a history of morbid obesity, HFrecEF (50-55%) due to non-ischemic cardiomyopathy and LBBB s/p CRT-D, CAD, HTN H/O endometrial ca s/p resection Morbid obesity with comorbidity      Current Medication: Outpatient Encounter Medications as of 04/09/2023  Medication Sig   acetaminophen (TYLENOL) 500 MG tablet Take 500 mg by mouth every 8 (eight) hours. Take 2 tablets by mouth every 8 hours.   albuterol (VENTOLIN HFA) 108 (90 Base) MCG/ACT inhaler Inhale 2 puffs into the lungs every 6 (six) hours as needed for wheezing or shortness of breath.   alendronate (FOSAMAX) 70 MG tablet Take 1 tablet (70 mg total) by mouth every 7 (seven) days. Take with a full glass of water on an empty stomach.   allopurinol (ZYLOPRIM) 100 MG tablet TAKE 1 TABLET EVERY DAY   cholecalciferol (VITAMIN D) 1000 units tablet Take 1,000 Units by mouth daily.   clotrimazole (LOTRIMIN) 1 % cream Apply 1 Application topically 2 (two) times daily. Apply topically 2x daily.   colchicine 0.6 MG tablet TAKE 1 TABLET EVERY DAY AS NEEDED FOR GOUT FLARE   Cyanocobalamin  (VITAMIN B-12 PO) Take by mouth. Pt takes [place] gummies   cyclobenzaprine (FLEXERIL) 5 MG tablet Take 1 tablet (5 mg total) by mouth 3 (three) times daily as needed for muscle spasms.   diclofenac Sodium (VOLTAREN) 1 % GEL Apply topically 4 (four) times daily.   diphenoxylate-atropine (LOMOTIL) 2.5-0.025 MG tablet Take 1 tablet by mouth 4 (four) times daily as needed for diarrhea or loose stools. Alternate with immodium   Dulaglutide 1.5 MG/0.5ML SOPN Inject 1.5 mg into the skin once a week.   esomeprazole (NEXIUM) 20 MG capsule Take 20 mg by mouth daily at 12 noon.   furosemide (LASIX) 20 MG tablet TAKE 1 TABLET EVERY DAY   gabapentin (NEURONTIN) 300 MG capsule Take 1 capsule by mouth in morning, 1 capsule at midday and  3 capsule at bedtime   ipratropium-albuterol (DUONEB) 0.5-2.5 (3) MG/3ML SOLN Take 3 mLs by nebulization every 4 (four) hours as needed.   lidocaine 4 % Place 1 patch onto the skin daily.   lisinopril (ZESTRIL) 20 MG tablet Take 1 tablet (20 mg total) by mouth daily.   loratadine (CLARITIN) 10 MG tablet Take 1 tablet (10 mg total) by mouth daily.   magnesium oxide (MAG-OX) 400 (240 Mg) MG tablet Take 400 mg by mouth 2 (two) times daily.   meloxicam (MOBIC) 15 MG tablet Take 1 tablet (15 mg total) by mouth daily. In am with breakfast   metoprolol succinate (TOPROL-XL) 50 MG 24 hr tablet Take 1 tablet (50 mg  total) by mouth daily. Take with or immediately following a meal.   montelukast (SINGULAIR) 10 MG tablet Take 1 tablet (10 mg total) by mouth at bedtime.   Multiple Vitamin (MULTIVITAMIN) capsule Take 1 capsule by mouth daily.   nystatin (MYCOSTATIN/NYSTOP) powder Apply 1 Application topically daily as needed (stoma rash/irritation).   ondansetron (ZOFRAN-ODT) 4 MG disintegrating tablet Take 1 tablet (4 mg total) by mouth every 8 (eight) hours as needed for nausea or vomiting.   rosuvastatin (CRESTOR) 10 MG tablet TAKE 1 TABLET TWICE WEEKLY   traMADol (ULTRAM) 50 MG  tablet Take one tab po bid for pain prn   venlafaxine XR (EFFEXOR-XR) 37.5 MG 24 hr capsule TAKE 1 CAPSULE EVERY DAY   [DISCONTINUED] fluticasone-salmeterol (ADVAIR) 500-50 MCG/ACT AEPB Inhale 1 puff into the lungs in the morning and at bedtime.   [DISCONTINUED] HYDROcodone-acetaminophen (NORCO/VICODIN) 5-325 MG tablet Take 1 tablet by mouth every 6 (six) hours as needed for severe pain.   [DISCONTINUED] potassium chloride SA (KLOR-CON M) 20 MEQ tablet TAKE 1 TABLET EVERY DAY   No facility-administered encounter medications on file as of 04/09/2023.    Surgical History: Past Surgical History:  Procedure Laterality Date   ABCESS DRAINAGE  2016   Abdominal abcess due to diverticulitis   caridoverter defibrillator  11/14/2017   ICD   CATARACT EXTRACTION W/PHACO Left 10/26/2015   Procedure: CATARACT EXTRACTION PHACO AND INTRAOCULAR LENS PLACEMENT (IOC) LEFT EYE;  Surgeon: Lockie Mola, MD;  Location: [facility];  Service: Ophthalmology;  Laterality: Left;   CATARACT EXTRACTION W/PHACO Right 12/07/2015   Procedure: CATARACT EXTRACTION PHACO AND INTRAOCULAR LENS PLACEMENT (IOC);  Surgeon: Lockie Mola, MD;  Location: [facility];  Service: Ophthalmology;  Laterality: Right;   CHOLECYSTECTOMY     FOOT SURGERY     Per patient, bilateral foot surgery.   OSTOMY  01/25/2021   OSTOMY  02/11/2021   PELVIC FRACTURE SURGERY     Per patient for cancer.    Medical History: Past Medical History:  Diagnosis Date   Arthritis    Asthma    Cancer (HCC)    Deep vein thrombosis (DVT) (HCC) 2015   Depression    GERD (gastroesophageal reflux disease)    Hypertension    Weakness of both legs     Family History: Family History  Problem Relation Age of Onset   Depression Sister    Heart disease Sister    Early death Sister    Depression Sister    Depression Sister    Depression Sister    Depression Sister     Social History   Socioeconomic History   Marital  status: Married    Spouse name: Not on file   Number of children: Not on file   Years of education: Not on file   Highest education level: Not on file  Occupational History   Not on file  Tobacco Use   Smoking status: Never   Smokeless tobacco: Never  Vaping Use   Vaping status: Never Used  Substance and Sexual Activity   Alcohol use: No   Drug use: No   Sexual activity: Not Currently  Other Topics Concern   Not on file  Social History Narrative   Not on file   Social Drivers of Health   Financial Resource Strain: Low Risk  (11/15/2022)   Received from [facility]   Overall Financial Resource Strain (CARDIA)    Difficulty of Paying Living Expenses: Not very hard  Food Insecurity:  No Food Insecurity (11/15/2022)   Received from [facility]   Hunger Vital Sign    Worried About Running Out of Food in the Last Year: Never true    Ran Out of Food in the Last Year: Never true  Transportation Needs: No Transportation Needs (11/15/2022)   Received from [facility] - Transportation    Lack of Transportation (Medical): No    Lack of Transportation (Non-Medical): No  Physical Activity: Not on file  Stress: Not on file  Social Connections: Not on file  Intimate Partner Violence: Not on file      Review of Systems  Constitutional:  Negative for fatigue and fever.  HENT:  Negative for congestion, mouth sores and postnasal drip.   Respiratory:  Positive for shortness of breath. Negative for cough.   Cardiovascular:  Positive for leg swelling. Negative for chest pain.  Genitourinary:  Negative for flank pain.  Psychiatric/Behavioral: Negative.      Vital Signs: BP 117/89   Pulse 89   Temp 98.2 F (36.8 C)   Resp 16   Ht 5' (1.524 m)   Wt 244 lb 3.2 oz (110.8 kg)   SpO2 99%   BMI 47.69 kg/m    Physical Exam Constitutional:      Appearance: Normal appearance.  HENT:     Head: Normocephalic and atraumatic.     Nose: Nose normal.      Mouth/Throat:     Mouth: Mucous membranes are moist.     Pharynx: No posterior oropharyngeal erythema.  Eyes:     Extraocular Movements: Extraocular movements intact.     Pupils: Pupils are equal, round, and reactive to light.  Cardiovascular:     Pulses: Normal pulses.     Heart sounds: Normal heart sounds.  Pulmonary:     Effort: Pulmonary effort is normal.     Breath sounds: Normal breath sounds.  Musculoskeletal:        General: Swelling and tenderness present.  Neurological:     General: No focal deficit present.     Mental Status: She is alert.  Psychiatric:        Mood and Affect: Mood normal.        Behavior: Behavior normal.        Assessment/Plan: 1. Chronic systolic heart failure (HCC) (Primary) Will continue to monitor her clinically. Looks well today, adjust diuretics and K prn, followed by cardiology  - Basic Metabolic Panel (BMET)  2. Class 3 obesity with alveolar hypoventilation, serious comorbidity, and body mass index (BMI) of 45.0 to 49.9 in adult [facility]) Pt is unable to use any device for OSA, pain in her knees and back is a limiting factor in her ambulation, might need to see pain management in future, continue on Trulicity for wt management   3. Other chronic pain Avoid NSAIDS, tramadol does not herlp, might need narcotics at this point, will need to monitor oxygenation overnight   4. Aortic atherosclerosis (HCC) Continue Crestor    General Counseling: Scarlettrose verbalizes understanding of the findings of todays visit and agrees with plan of treatment. I have discussed any further diagnostic evaluation that may be needed or ordered today. We also reviewed her medications today. she has been encouraged to call the office with any questions or concerns that should arise related to todays visit.    Orders Placed This Encounter  Procedures   Basic Metabolic Panel (BMET)    No orders of the defined types were  placed in this encounter.   Total time  spent:45 Minutes Time spent includes review of chart, medications, test results, and follow up plan with the patient.   [place] Controlled Substance Database was reviewed by me.   Dr Lyndon Code Internal medicine

## 2023-04-11 DIAGNOSIS — L814 Other melanin hyperpigmentation: Secondary | ICD-10-CM | POA: Diagnosis not present

## 2023-04-11 DIAGNOSIS — L578 Other skin changes due to chronic exposure to nonionizing radiation: Secondary | ICD-10-CM | POA: Diagnosis not present

## 2023-04-11 DIAGNOSIS — L821 Other seborrheic keratosis: Secondary | ICD-10-CM | POA: Diagnosis not present

## 2023-04-11 DIAGNOSIS — Z85828 Personal history of other malignant neoplasm of skin: Secondary | ICD-10-CM | POA: Diagnosis not present

## 2023-04-17 ENCOUNTER — Other Ambulatory Visit: Payer: Self-pay | Admitting: Nurse Practitioner

## 2023-04-17 DIAGNOSIS — Z760 Encounter for issue of repeat prescription: Secondary | ICD-10-CM

## 2023-04-29 ENCOUNTER — Other Ambulatory Visit: Payer: Self-pay | Admitting: Nurse Practitioner

## 2023-04-29 DIAGNOSIS — J4520 Mild intermittent asthma, uncomplicated: Secondary | ICD-10-CM

## 2023-05-01 ENCOUNTER — Other Ambulatory Visit: Payer: Self-pay | Admitting: Nurse Practitioner

## 2023-05-01 DIAGNOSIS — G8929 Other chronic pain: Secondary | ICD-10-CM

## 2023-05-01 MED ORDER — HYDROCODONE-ACETAMINOPHEN 5-325 MG PO TABS: 1.0000 | ORAL_TABLET | Freq: Four times a day (QID) | ORAL | 0 refills | Status: DC | PRN

## 2023-05-14 ENCOUNTER — Ambulatory Visit: Payer: Medicare HMO | Admitting: Nurse Practitioner

## 2023-05-14 DIAGNOSIS — R609 Edema, unspecified: Secondary | ICD-10-CM | POA: Diagnosis not present

## 2023-05-15 LAB — BASIC METABOLIC PANEL
BUN/Creatinine Ratio: 20 (ref 12–28)
BUN: 27 mg/dL (ref 8–27)
CO2: 21 mmol/L (ref 20–29)
Calcium: 9.7 mg/dL (ref 8.7–10.3)
Chloride: 101 mmol/L (ref 96–106)
Creatinine, Ser: 1.38 mg/dL — ABNORMAL HIGH (ref 0.57–1.00)
Glucose: 82 mg/dL (ref 70–99)
Potassium: 4.8 mmol/L (ref 3.5–5.2)
Sodium: 140 mmol/L (ref 134–144)
eGFR: 40 mL/min/{1.73_m2} — ABNORMAL LOW (ref >59)

## 2023-05-20 DIAGNOSIS — S31829A Unspecified open wound of left buttock, initial encounter: Secondary | ICD-10-CM | POA: Diagnosis not present

## 2023-05-20 DIAGNOSIS — Z433 Encounter for attention to colostomy: Secondary | ICD-10-CM | POA: Diagnosis not present

## 2023-05-20 DIAGNOSIS — Z933 Colostomy status: Secondary | ICD-10-CM | POA: Diagnosis not present

## 2023-05-20 DIAGNOSIS — S31101A Unspecified open wound of abdominal wall, left upper quadrant without penetration into peritoneal cavity, initial encounter: Secondary | ICD-10-CM | POA: Diagnosis not present

## 2023-05-23 ENCOUNTER — Encounter: Payer: Self-pay | Admitting: Nurse Practitioner

## 2023-05-23 ENCOUNTER — Ambulatory Visit: Payer: Medicare HMO | Admitting: Nurse Practitioner

## 2023-05-23 DIAGNOSIS — G8929 Other chronic pain: Secondary | ICD-10-CM | POA: Diagnosis not present

## 2023-05-23 DIAGNOSIS — M5441 Lumbago with sciatica, right side: Secondary | ICD-10-CM

## 2023-05-23 DIAGNOSIS — R197 Diarrhea, unspecified: Secondary | ICD-10-CM

## 2023-05-23 DIAGNOSIS — Z79899 Other long term (current) drug therapy: Secondary | ICD-10-CM

## 2023-05-23 DIAGNOSIS — J4520 Mild intermittent asthma, uncomplicated: Secondary | ICD-10-CM

## 2023-05-23 DIAGNOSIS — M5442 Lumbago with sciatica, left side: Secondary | ICD-10-CM

## 2023-05-23 MED ORDER — HYDROCODONE-ACETAMINOPHEN 5-325 MG PO TABS: 1.0000 | ORAL_TABLET | Freq: Four times a day (QID) | ORAL | 0 refills | Status: DC | PRN

## 2023-05-23 MED ORDER — MONTELUKAST SODIUM 10 MG PO TABS
10.0000 mg | ORAL_TABLET | Freq: Every day | ORAL | 3 refills | Status: AC
Start: 2023-05-23 — End: ?

## 2023-05-23 MED ORDER — DIPHENOXYLATE-ATROPINE 2.5-0.025 MG PO TABS: 1.0000 | ORAL_TABLET | Freq: Four times a day (QID) | ORAL | 0 refills | Status: DC | PRN

## 2023-05-23 NOTE — Progress Notes (Signed)
[facility] [address] [place], [place] [address]  Internal MEDICINE  Office Visit Note  Patient Name: Diana Bernard  [id number]  [id number]  Date of Service: 05/23/2023  Chief Complaint  Patient presents with   Depression   Gastroesophageal Reflux   Hypertension   Follow-up    HPI Diana Bernard presents for a follow-up visit for left knee pain, chronic asthma, and diarrhea.  Left knee pain -- does not want to have anymore surgeries but wants to take OTC glucosamine-chondroitin.  Chronic asthma -- taking montelukast Diarrhea -- has been using immodium but this is not helping very much.     Current Medication: Outpatient Encounter Medications as of 05/23/2023  Medication Sig   acetaminophen (TYLENOL) 500 MG tablet Take 500 mg by mouth every 8 (eight) hours. Take 2 tablets by mouth every 8 hours.   albuterol (VENTOLIN HFA) 108 (90 Base) MCG/ACT inhaler Inhale 2 puffs into the lungs every 6 (six) hours as needed for wheezing or shortness of breath.   alendronate (FOSAMAX) 70 MG tablet Take 1 tablet (70 mg total) by mouth every 7 (seven) days. Take with a full glass of water on an empty stomach.   allopurinol (ZYLOPRIM) 100 MG tablet TAKE 1 TABLET EVERY DAY   cholecalciferol (VITAMIN D) 1000 units tablet Take 1,000 Units by mouth daily.   clotrimazole (LOTRIMIN) 1 % cream Apply 1 Application topically 2 (two) times daily. Apply topically 2x daily.   colchicine 0.6 MG tablet TAKE 1 TABLET EVERY DAY AS NEEDED FOR GOUT FLARE   Cyanocobalamin (VITAMIN B-12 PO) Take by mouth. Pt takes [place] gummies   cyclobenzaprine (FLEXERIL) 5 MG tablet Take 1 tablet (5 mg total) by mouth 3 (three) times daily as needed for muscle spasms.   diclofenac Sodium (VOLTAREN) 1 % GEL Apply topically 4 (four) times daily.   Dulaglutide 1.5 MG/0.5ML SOPN Inject 1.5 mg into the skin once a week.   esomeprazole (NEXIUM) 20 MG capsule Take 20 mg by mouth daily at 12 noon.    fluticasone-salmeterol (ADVAIR) 500-50 MCG/ACT AEPB INHALE 1 PUFF INTO THE LUNGS IN THE MORNING AND AT BEDTIME.   furosemide (LASIX) 20 MG tablet TAKE 1 TABLET EVERY DAY   gabapentin (NEURONTIN) 300 MG capsule Take 1 capsule by mouth in morning, 1 capsule at midday and  3 capsule at bedtime   ipratropium-albuterol (DUONEB) 0.5-2.5 (3) MG/3ML SOLN Take 3 mLs by nebulization every 4 (four) hours as needed.   lidocaine 4 % Place 1 patch onto the skin daily.   lisinopril (ZESTRIL) 20 MG tablet Take 1 tablet (20 mg total) by mouth daily.   loratadine (CLARITIN) 10 MG tablet Take 1 tablet (10 mg total) by mouth daily.   magnesium oxide (MAG-OX) 400 (240 Mg) MG tablet Take 400 mg by mouth 2 (two) times daily.   meloxicam (MOBIC) 15 MG tablet Take 1 tablet (15 mg total) by mouth daily. In am with breakfast   metoprolol succinate (TOPROL-XL) 50 MG 24 hr tablet Take 1 tablet (50 mg total) by mouth daily. Take with or immediately following a meal.   Multiple Vitamin (MULTIVITAMIN) capsule Take 1 capsule by mouth daily.   nystatin (MYCOSTATIN/NYSTOP) powder Apply 1 Application topically daily as needed (stoma rash/irritation).   ondansetron (ZOFRAN-ODT) 4 MG disintegrating tablet Take 1 tablet (4 mg total) by mouth every 8 (eight) hours as needed for nausea or vomiting.   potassium chloride SA (KLOR-CON M) 20 MEQ tablet TAKE 1 TABLET EVERY DAY  rosuvastatin (CRESTOR) 10 MG tablet TAKE 1 TABLET TWICE WEEKLY   venlafaxine XR (EFFEXOR-XR) 37.5 MG 24 hr capsule TAKE 1 CAPSULE EVERY DAY   [DISCONTINUED] diphenoxylate-atropine (LOMOTIL) 2.5-0.025 MG tablet Take 1 tablet by mouth 4 (four) times daily as needed for diarrhea or loose stools. Alternate with immodium   [DISCONTINUED] HYDROcodone-acetaminophen (NORCO/VICODIN) 5-325 MG tablet Take 1 tablet by mouth every 6 (six) hours as needed for severe pain (pain score 7-10).   [DISCONTINUED] montelukast (SINGULAIR) 10 MG tablet Take 1 tablet (10 mg total) by mouth at  bedtime.   [DISCONTINUED] traMADol (ULTRAM) 50 MG tablet Take one tab po bid for pain prn   diphenoxylate-atropine (LOMOTIL) 2.5-0.025 MG tablet Take 1 tablet by mouth 4 (four) times daily as needed for diarrhea or loose stools. Alternate with immodium   HYDROcodone-acetaminophen (NORCO/VICODIN) 5-325 MG tablet Take 1 tablet by mouth every 6 (six) hours as needed for severe pain (pain score 7-10).   montelukast (SINGULAIR) 10 MG tablet Take 1 tablet (10 mg total) by mouth at bedtime.   No facility-administered encounter medications on file as of 05/23/2023.    Surgical History: Past Surgical History:  Procedure Laterality Date   ABCESS DRAINAGE  2016   Abdominal abcess due to diverticulitis   caridoverter defibrillator  11/14/2017   ICD   CATARACT EXTRACTION W/PHACO Left 10/26/2015   Procedure: CATARACT EXTRACTION PHACO AND INTRAOCULAR LENS PLACEMENT (IOC) LEFT EYE;  Surgeon: Lockie Mola, MD;  Location: [facility];  Service: Ophthalmology;  Laterality: Left;   CATARACT EXTRACTION W/PHACO Right 12/07/2015   Procedure: CATARACT EXTRACTION PHACO AND INTRAOCULAR LENS PLACEMENT (IOC);  Surgeon: Lockie Mola, MD;  Location: [facility];  Service: Ophthalmology;  Laterality: Right;   CHOLECYSTECTOMY     FOOT SURGERY     Per patient, bilateral foot surgery.   OSTOMY  01/25/2021   OSTOMY  02/11/2021   PELVIC FRACTURE SURGERY     Per patient for cancer.    Medical History: Past Medical History:  Diagnosis Date   Arthritis    Asthma    Cancer (HCC)    Deep vein thrombosis (DVT) (HCC) 2015   Depression    GERD (gastroesophageal reflux disease)    Hypertension    Weakness of both legs     Family History: Family History  Problem Relation Age of Onset   Depression Sister    Heart disease Sister    Early death Sister    Depression Sister    Depression Sister    Depression Sister    Depression Sister     Social History   Socioeconomic History    Marital status: Married    Spouse name: Not on file   Number of children: Not on file   Years of education: Not on file   Highest education level: Not on file  Occupational History   Not on file  Tobacco Use   Smoking status: Never   Smokeless tobacco: Never  Vaping Use   Vaping status: Never Used  Substance and Sexual Activity   Alcohol use: No   Drug use: No   Sexual activity: Not Currently  Other Topics Concern   Not on file  Social History Narrative   Not on file   Social Drivers of Health   Financial Resource Strain: Low Risk  (11/15/2022)   Received from [facility]   Overall Financial Resource Strain (CARDIA)    Difficulty of Paying Living Expenses: Not very hard  Food Insecurity: No Food  Insecurity (11/15/2022)   Received from [facility]   Hunger Vital Sign    Worried About Running Out of Food in the Last Year: Never true    Ran Out of Food in the Last Year: Never true  Transportation Needs: No Transportation Needs (11/15/2022)   Received from [facility] - Transportation    Lack of Transportation (Medical): No    Lack of Transportation (Non-Medical): No  Physical Activity: Not on file  Stress: Not on file  Social Connections: Not on file  Intimate Partner Violence: Not on file      Review of Systems  Constitutional:  Negative for fatigue and fever.  HENT: Negative.  Negative for congestion, mouth sores and postnasal drip.   Respiratory:  Negative for cough, chest tightness, shortness of breath and wheezing.   Cardiovascular:  Positive for leg swelling. Negative for chest pain and palpitations.  Gastrointestinal:  Positive for diarrhea. Negative for constipation, nausea and vomiting.  Genitourinary:  Negative for flank pain.  Musculoskeletal: Negative.   Psychiatric/Behavioral: Negative.      Vital Signs: BP 135/80 Comment: 178/83  Pulse 86   Temp 98.4 F (36.9 C)   Resp 16   Ht 5' (1.524 m)   Wt 252 lb 9.6 oz (114.6 kg)    SpO2 97%   BMI 49.33 kg/m    Physical Exam Vitals reviewed.  Constitutional:      General: She is not in acute distress.    Appearance: Normal appearance. She is obese. She is not ill-appearing.  HENT:     Head: Normocephalic and atraumatic.  Eyes:     Pupils: Pupils are equal, round, and reactive to light.  Cardiovascular:     Rate and Rhythm: Normal rate and regular rhythm.  Pulmonary:     Effort: Pulmonary effort is normal. No respiratory distress.  Neurological:     Mental Status: She is alert and oriented to person, place, and time.  Psychiatric:        Mood and Affect: Mood normal.        Behavior: Behavior normal.        Assessment/Plan: 1. Chronic bilateral low back pain with bilateral sciatica Hydrocodone prescribed as needed for chronic back pain  - HYDROcodone-acetaminophen (NORCO/VICODIN) 5-325 MG tablet; Take 1 tablet by mouth every 6 (six) hours as needed for severe pain (pain score 7-10).  Dispense: 30 tablet; Refill: 0  2. Diarrhea, unspecified type Lomotil prescribed as needed.  - diphenoxylate-atropine (LOMOTIL) 2.5-0.025 MG tablet; Take 1 tablet by mouth 4 (four) times daily as needed for diarrhea or loose stools. Alternate with immodium  Dispense: 30 tablet; Refill: 0  3. Chronic asthma, mild intermittent, uncomplicated Continue montelukast as prescribed.  - montelukast (SINGULAIR) 10 MG tablet; Take 1 tablet (10 mg total) by mouth at bedtime.  Dispense: 90 tablet; Refill: 3   General Counseling: Torrey verbalizes understanding of the findings of todays visit and agrees with plan of treatment. I have discussed any further diagnostic evaluation that may be needed or ordered today. We also reviewed her medications today. she has been encouraged to call the office with any questions or concerns that should arise related to todays visit.    No orders of the defined types were placed in this encounter.   Meds ordered this encounter  Medications    HYDROcodone-acetaminophen (NORCO/VICODIN) 5-325 MG tablet    Sig: Take 1 tablet by mouth every 6 (six) hours as needed for severe pain (pain  score 7-10).    Dispense:  30 tablet    Refill:  0    Refill, note increase number of tablets.   diphenoxylate-atropine (LOMOTIL) 2.5-0.025 MG tablet    Sig: Take 1 tablet by mouth 4 (four) times daily as needed for diarrhea or loose stools. Alternate with immodium    Dispense:  30 tablet    Refill:  0    Fill new script today.   montelukast (SINGULAIR) 10 MG tablet    Sig: Take 1 tablet (10 mg total) by mouth at bedtime.    Dispense:  90 tablet    Refill:  3    Return in about 3 months (around 08/21/2023) for F/U, Gurtha Picker PCP.   Total time spent:30 Minutes Time spent includes review of chart, medications, test results, and follow up plan with the patient.   [place] Controlled Substance Database was reviewed by me.  This patient was seen by Sallyanne Kuster, FNP-C in collaboration with Dr. Beverely Risen as a part of collaborative care agreement.   Natiya Seelinger R. Tedd Sias, MSN, FNP-C Internal medicine

## 2023-05-27 DIAGNOSIS — I5020 Unspecified systolic (congestive) heart failure: Secondary | ICD-10-CM | POA: Diagnosis not present

## 2023-06-14 ENCOUNTER — Encounter: Payer: Self-pay | Admitting: Nurse Practitioner

## 2023-06-17 ENCOUNTER — Other Ambulatory Visit: Payer: Self-pay | Admitting: Nurse Practitioner

## 2023-06-17 DIAGNOSIS — Z79899 Other long term (current) drug therapy: Secondary | ICD-10-CM

## 2023-06-17 NOTE — Telephone Encounter (Signed)
 Please review and send

## 2023-06-19 DIAGNOSIS — Z433 Encounter for attention to colostomy: Secondary | ICD-10-CM | POA: Diagnosis not present

## 2023-06-19 DIAGNOSIS — S31101A Unspecified open wound of abdominal wall, left upper quadrant without penetration into peritoneal cavity, initial encounter: Secondary | ICD-10-CM | POA: Diagnosis not present

## 2023-06-19 DIAGNOSIS — S31829A Unspecified open wound of left buttock, initial encounter: Secondary | ICD-10-CM | POA: Diagnosis not present

## 2023-06-19 DIAGNOSIS — Z933 Colostomy status: Secondary | ICD-10-CM | POA: Diagnosis not present

## 2023-06-29 ENCOUNTER — Other Ambulatory Visit: Payer: Self-pay | Admitting: Nurse Practitioner

## 2023-06-29 DIAGNOSIS — I5022 Chronic systolic (congestive) heart failure: Secondary | ICD-10-CM

## 2023-06-29 DIAGNOSIS — I10 Essential (primary) hypertension: Secondary | ICD-10-CM

## 2023-07-05 ENCOUNTER — Telehealth: Admitting: Nurse Practitioner

## 2023-07-05 ENCOUNTER — Encounter: Payer: Self-pay | Admitting: Nurse Practitioner

## 2023-07-05 VITALS — Resp 16 | Ht 60.0 in | Wt 243.0 lb

## 2023-07-05 DIAGNOSIS — J101 Influenza due to other identified influenza virus with other respiratory manifestations: Secondary | ICD-10-CM | POA: Diagnosis not present

## 2023-07-05 MED ORDER — PREDNISONE 10 MG (21) PO TBPK: ORAL_TABLET | ORAL | 0 refills | Status: DC

## 2023-07-05 MED ORDER — BENZONATATE 200 MG PO CAPS: 200.0000 mg | ORAL_CAPSULE | Freq: Three times a day (TID) | ORAL | 0 refills | Status: DC | PRN

## 2023-07-05 MED ORDER — OSELTAMIVIR PHOSPHATE 75 MG PO CAPS
75.0000 mg | ORAL_CAPSULE | Freq: Two times a day (BID) | ORAL | 0 refills | Status: DC
Start: 2023-07-05 — End: 2023-08-22

## 2023-07-05 NOTE — Progress Notes (Signed)
 [facility] [address] [place], [place] [address]  Internal MEDICINE  Telephone Visit  Patient Name: Diana Bernard  [id number]  [id number]  Date of Service: 07/05/2023  I connected with the patient at 1220 by telephone and verified the patients identity using two identifiers.   I discussed the limitations, risks, security and privacy concerns of performing an evaluation and management service by telephone and the availability of in person appointments. I also discussed with the patient that there may be a patient responsible charge related to the service.  The patient expressed understanding and agrees to proceed.    Chief Complaint  Patient presents with   Telephone Screen    Flu exposure.    Telephone Assessment    HPI Deneene presents for a telehealth virtual visit for flu positive and exposure to flu Symptoms started yesterday Home test is positive for influenza A. Reports fever, chills, wheezing, body aches, headaches, cough, sinus drainage, SOB, and chest tightness.   Current Medication: Outpatient Encounter Medications as of 07/05/2023  Medication Sig   acetaminophen (TYLENOL) 500 MG tablet Take 500 mg by mouth every 8 (eight) hours. Take 2 tablets by mouth every 8 hours.   albuterol (VENTOLIN HFA) 108 (90 Base) MCG/ACT inhaler Inhale 2 puffs into the lungs every 6 (six) hours as needed for wheezing or shortness of breath.   alendronate (FOSAMAX) 70 MG tablet Take 1 tablet (70 mg total) by mouth every 7 (seven) days. Take with a full glass of water on an empty stomach.   allopurinol (ZYLOPRIM) 100 MG tablet TAKE 1 TABLET EVERY DAY   benzonatate (TESSALON) 200 MG capsule Take 1 capsule (200 mg total) by mouth 3 (three) times daily as needed for cough.   cholecalciferol (VITAMIN D) 1000 units tablet Take 1,000 Units by mouth daily.   clotrimazole (LOTRIMIN) 1 % cream Apply 1 Application topically 2 (two) times daily. Apply topically 2x daily.   colchicine 0.6  MG tablet TAKE 1 TABLET EVERY DAY AS NEEDED FOR GOUT FLARE   Cyanocobalamin (VITAMIN B-12 PO) Take by mouth. Pt takes [place] gummies   cyclobenzaprine (FLEXERIL) 5 MG tablet Take 1 tablet (5 mg total) by mouth 3 (three) times daily as needed for muscle spasms.   diclofenac Sodium (VOLTAREN) 1 % GEL Apply topically 4 (four) times daily.   diphenoxylate-atropine (LOMOTIL) 2.5-0.025 MG tablet Take 1 tablet by mouth 4 (four) times daily as needed for diarrhea or loose stools. Alternate with immodium   Dulaglutide 1.5 MG/0.5ML SOPN Inject 1.5 mg into the skin once a week.   esomeprazole (NEXIUM) 20 MG capsule Take 20 mg by mouth daily at 12 noon.   fluticasone-salmeterol (ADVAIR) 500-50 MCG/ACT AEPB INHALE 1 PUFF INTO THE LUNGS IN THE MORNING AND AT BEDTIME.   furosemide (LASIX) 20 MG tablet TAKE 1 TABLET EVERY DAY   gabapentin (NEURONTIN) 300 MG capsule TAKE 1 CAPSULE BY MOUTH IN MORNING, 1 CAPSULE AT MIDDAY AND 3 CAPSULES AT BEDTIME   HYDROcodone-acetaminophen (NORCO/VICODIN) 5-325 MG tablet Take 1 tablet by mouth every 6 (six) hours as needed for severe pain (pain score 7-10).   ipratropium-albuterol (DUONEB) 0.5-2.5 (3) MG/3ML SOLN Take 3 mLs by nebulization every 4 (four) hours as needed.   lidocaine 4 % Place 1 patch onto the skin daily.   lisinopril (ZESTRIL) 20 MG tablet Take 1 tablet (20 mg total) by mouth daily.   loratadine (CLARITIN) 10 MG tablet Take 1 tablet (10 mg total) by mouth daily.  magnesium oxide (MAG-OX) 400 (240 Mg) MG tablet Take 400 mg by mouth 2 (two) times daily.   meloxicam (MOBIC) 15 MG tablet Take 1 tablet (15 mg total) by mouth daily. In am with breakfast   metoprolol succinate (TOPROL-XL) 50 MG 24 hr tablet Take 1 tablet (50 mg total) by mouth daily. Take with or immediately following a meal.   montelukast (SINGULAIR) 10 MG tablet Take 1 tablet (10 mg total) by mouth at bedtime.   Multiple Vitamin (MULTIVITAMIN) capsule Take 1 capsule by mouth daily.   nystatin  (MYCOSTATIN/NYSTOP) powder Apply 1 Application topically daily as needed (stoma rash/irritation).   ondansetron (ZOFRAN-ODT) 4 MG disintegrating tablet Take 1 tablet (4 mg total) by mouth every 8 (eight) hours as needed for nausea or vomiting.   oseltamivir (TAMIFLU) 75 MG capsule Take 1 capsule (75 mg total) by mouth 2 (two) times daily.   potassium chloride SA (KLOR-CON M) 20 MEQ tablet TAKE 1 TABLET EVERY DAY   predniSONE (STERAPRED UNI-PAK 21 TAB) 10 MG (21) TBPK tablet Use as directed for 6 days   rosuvastatin (CRESTOR) 10 MG tablet TAKE 1 TABLET TWICE WEEKLY   venlafaxine XR (EFFEXOR-XR) 37.5 MG 24 hr capsule TAKE 1 CAPSULE EVERY DAY   No facility-administered encounter medications on file as of 07/05/2023.    Surgical History: Past Surgical History:  Procedure Laterality Date   ABCESS DRAINAGE  2016   Abdominal abcess due to diverticulitis   caridoverter defibrillator  11/14/2017   ICD   CATARACT EXTRACTION W/PHACO Left 10/26/2015   Procedure: CATARACT EXTRACTION PHACO AND INTRAOCULAR LENS PLACEMENT (IOC) LEFT EYE;  Surgeon: Lockie Mola, MD;  Location: [facility];  Service: Ophthalmology;  Laterality: Left;   CATARACT EXTRACTION W/PHACO Right 12/07/2015   Procedure: CATARACT EXTRACTION PHACO AND INTRAOCULAR LENS PLACEMENT (IOC);  Surgeon: Lockie Mola, MD;  Location: [facility];  Service: Ophthalmology;  Laterality: Right;   CHOLECYSTECTOMY     FOOT SURGERY     Per patient, bilateral foot surgery.   OSTOMY  01/25/2021   OSTOMY  02/11/2021   PELVIC FRACTURE SURGERY     Per patient for cancer.    Medical History: Past Medical History:  Diagnosis Date   Arthritis    Asthma    Cancer (HCC)    Deep vein thrombosis (DVT) (HCC) 2015   Depression    GERD (gastroesophageal reflux disease)    Hypertension    Weakness of both legs     Family History: Family History  Problem Relation Age of Onset   Depression Sister    Heart disease Sister     Early death Sister    Depression Sister    Depression Sister    Depression Sister    Depression Sister     Social History   Socioeconomic History   Marital status: Married    Spouse name: Not on file   Number of children: Not on file   Years of education: Not on file   Highest education level: Not on file  Occupational History   Not on file  Tobacco Use   Smoking status: Never   Smokeless tobacco: Never  Vaping Use   Vaping status: Never Used  Substance and Sexual Activity   Alcohol use: No   Drug use: No   Sexual activity: Not Currently  Other Topics Concern   Not on file  Social History Narrative   Not on file   Social Drivers of Health   Financial Resource Strain: Low  Risk  (11/15/2022)   Received [facility]   Overall Financial Resource Strain (CARDIA)    Difficulty of Paying Living Expenses: Not very hard  Food Insecurity: No Food Insecurity (11/15/2022)   Received from [facility]   Hunger Vital Sign    Worried About Running Out of Food in the Last Year: Never true    Ran Out of Food in the Last Year: Never true  Transportation Needs: No Transportation Needs (11/15/2022)   Received from [facility] - Transportation    Lack of Transportation (Medical): No    Lack of Transportation (Non-Medical): No  Physical Activity: Not on file  Stress: Not on file  Social Connections: Not on file  Intimate Partner Violence: Not on file      Review of Systems  Constitutional:  Positive for fatigue.  HENT:  Positive for congestion, postnasal drip, rhinorrhea and sore throat. Negative for sinus pressure and sinus pain.   Respiratory:  Positive for cough, chest tightness, shortness of breath and wheezing.   Cardiovascular: Negative.  Negative for chest pain and palpitations.  Gastrointestinal:  Negative for diarrhea, nausea and vomiting.  Musculoskeletal:  Positive for myalgias.  Neurological:  Positive for weakness and headaches.    Vital  Signs: Resp 16   Ht 5' (1.524 m)   Wt 243 lb (110.2 kg)   BMI 47.46 kg/m    Observation/Objective: She is alert and oriented. No acute distress noted.     Assessment/Plan: 1. Influenza A with respiratory manifestations (Primary) Antiviral, prednisone and cough medication prescribed. Take as ordered.  - oseltamivir (TAMIFLU) 75 MG capsule; Take 1 capsule (75 mg total) by mouth 2 (two) times daily.  Dispense: 10 capsule; Refill: 0 - predniSONE (STERAPRED UNI-PAK 21 TAB) 10 MG (21) TBPK tablet; Use as directed for 6 days  Dispense: 21 tablet; Refill: 0 - benzonatate (TESSALON) 200 MG capsule; Take 1 capsule (200 mg total) by mouth 3 (three) times daily as needed for cough.  Dispense: 20 capsule; Refill: 0   General Counseling: Magdelene verbalizes understanding of the findings of today's phone visit and agrees with plan of treatment. I have discussed any further diagnostic evaluation that may be needed or ordered today. We also reviewed her medications today. she has been encouraged to call the office with any questions or concerns that should arise related to todays visit.  Return if symptoms worsen or fail to improve.   No orders of the defined types were placed in this encounter.   Meds ordered this encounter  Medications   oseltamivir (TAMIFLU) 75 MG capsule    Sig: Take 1 capsule (75 mg total) by mouth 2 (two) times daily.    Dispense:  10 capsule    Refill:  0    Fill new script today   predniSONE (STERAPRED UNI-PAK 21 TAB) 10 MG (21) TBPK tablet    Sig: Use as directed for 6 days    Dispense:  21 tablet    Refill:  0    Fill new script today   benzonatate (TESSALON) 200 MG capsule    Sig: Take 1 capsule (200 mg total) by mouth 3 (three) times daily as needed for cough.    Dispense:  20 capsule    Refill:  0    Fill new script today, please use good RX card if not covered by her insurance.    Time spent:10 Minutes Time spent with patient included reviewing progress  notes, labs,  imaging studies, and discussing plan for follow up.  [place] Controlled Substance Database was reviewed by me for overdose risk score (ORS) if appropriate.  This patient was seen by Sallyanne Kuster, FNP-C in collaboration with Dr. Beverely Risen as a part of collaborative care agreement.  Brantlee Penn R. Tedd Sias, MSN, FNP-C Internal medicine

## 2023-07-06 DIAGNOSIS — Z95810 Presence of automatic (implantable) cardiac defibrillator: Secondary | ICD-10-CM | POA: Diagnosis not present

## 2023-07-06 DIAGNOSIS — Z4502 Encounter for adjustment and management of automatic implantable cardiac defibrillator: Secondary | ICD-10-CM | POA: Diagnosis not present

## 2023-07-06 DIAGNOSIS — I5022 Chronic systolic (congestive) heart failure: Secondary | ICD-10-CM | POA: Diagnosis not present

## 2023-07-06 DIAGNOSIS — I509 Heart failure, unspecified: Secondary | ICD-10-CM | POA: Diagnosis not present

## 2023-07-10 ENCOUNTER — Other Ambulatory Visit: Payer: Self-pay | Admitting: Nurse Practitioner

## 2023-07-10 DIAGNOSIS — M10079 Idiopathic gout, unspecified ankle and foot: Secondary | ICD-10-CM

## 2023-07-17 ENCOUNTER — Other Ambulatory Visit: Payer: Self-pay | Admitting: Nurse Practitioner

## 2023-07-17 DIAGNOSIS — G8929 Other chronic pain: Secondary | ICD-10-CM

## 2023-07-19 ENCOUNTER — Telehealth: Payer: Self-pay

## 2023-07-19 DIAGNOSIS — G8929 Other chronic pain: Secondary | ICD-10-CM

## 2023-07-19 MED ORDER — HYDROCODONE-ACETAMINOPHEN 5-325 MG PO TABS: 1.0000 | ORAL_TABLET | Freq: Four times a day (QID) | ORAL | 0 refills | Status: DC | PRN

## 2023-07-19 MED ORDER — AZITHROMYCIN 250 MG PO TABS: 250.0000 mg | ORAL_TABLET | Freq: Every day | ORAL | 0 refills | Status: AC

## 2023-07-19 MED ORDER — PREDNISONE 10 MG (21) PO TBPK: ORAL_TABLET | ORAL | 0 refills | Status: DC

## 2023-07-19 NOTE — Telephone Encounter (Signed)
 Pt advised we  sent  zpak and prednisone and if she is not feeling better call us back and we can order chest xray or need appt

## 2023-07-27 DIAGNOSIS — I5032 Chronic diastolic (congestive) heart failure: Secondary | ICD-10-CM | POA: Diagnosis not present

## 2023-07-27 DIAGNOSIS — N189 Chronic kidney disease, unspecified: Secondary | ICD-10-CM | POA: Diagnosis not present

## 2023-07-27 DIAGNOSIS — Z66 Do not resuscitate: Secondary | ICD-10-CM | POA: Diagnosis not present

## 2023-07-27 DIAGNOSIS — I509 Heart failure, unspecified: Secondary | ICD-10-CM | POA: Diagnosis not present

## 2023-07-27 DIAGNOSIS — I132 Hypertensive heart and chronic kidney disease with heart failure and with stage 5 chronic kidney disease, or end stage renal disease: Secondary | ICD-10-CM | POA: Diagnosis not present

## 2023-07-27 DIAGNOSIS — Z833 Family history of diabetes mellitus: Secondary | ICD-10-CM | POA: Diagnosis not present

## 2023-07-27 DIAGNOSIS — E66813 Obesity, class 3: Secondary | ICD-10-CM | POA: Diagnosis not present

## 2023-07-27 DIAGNOSIS — I130 Hypertensive heart and chronic kidney disease with heart failure and stage 1 through stage 4 chronic kidney disease, or unspecified chronic kidney disease: Secondary | ICD-10-CM | POA: Diagnosis not present

## 2023-07-27 DIAGNOSIS — J111 Influenza due to unidentified influenza virus with other respiratory manifestations: Secondary | ICD-10-CM | POA: Diagnosis not present

## 2023-07-27 DIAGNOSIS — J449 Chronic obstructive pulmonary disease, unspecified: Secondary | ICD-10-CM | POA: Diagnosis not present

## 2023-07-27 DIAGNOSIS — Z6841 Body Mass Index (BMI) 40.0 and over, adult: Secondary | ICD-10-CM | POA: Diagnosis not present

## 2023-07-27 DIAGNOSIS — R079 Chest pain, unspecified: Secondary | ICD-10-CM | POA: Diagnosis not present

## 2023-07-27 DIAGNOSIS — R0789 Other chest pain: Secondary | ICD-10-CM | POA: Diagnosis not present

## 2023-07-27 DIAGNOSIS — Z800 Family history of malignant neoplasm of digestive organs: Secondary | ICD-10-CM | POA: Diagnosis not present

## 2023-07-27 DIAGNOSIS — I428 Other cardiomyopathies: Secondary | ICD-10-CM | POA: Diagnosis not present

## 2023-07-28 DIAGNOSIS — R079 Chest pain, unspecified: Secondary | ICD-10-CM | POA: Diagnosis not present

## 2023-08-05 ENCOUNTER — Encounter: Payer: Self-pay | Admitting: Physician Assistant

## 2023-08-05 ENCOUNTER — Ambulatory Visit: Admitting: Physician Assistant

## 2023-08-05 VITALS — BP 106/68 | HR 93 | Temp 98.2°F | Resp 16 | Ht 60.0 in | Wt 243.0 lb

## 2023-08-05 DIAGNOSIS — R0602 Shortness of breath: Secondary | ICD-10-CM | POA: Diagnosis not present

## 2023-08-05 DIAGNOSIS — I5022 Chronic systolic (congestive) heart failure: Secondary | ICD-10-CM | POA: Diagnosis not present

## 2023-08-05 DIAGNOSIS — Z8709 Personal history of other diseases of the respiratory system: Secondary | ICD-10-CM

## 2023-08-05 NOTE — Progress Notes (Signed)
 [facility] [address] [place], [place] [address]  Internal MEDICINE  Office Visit Note  Patient Name: Diana Bernard  [id number]  [id number]  Date of Service: 08/05/2023  Chief Complaint  Patient presents with   Follow-up    ED F/U for chest pain   Depression   Gastroesophageal Reflux   Hypertension    HPI Pt is here for ED follow up -Went to ED on 07/27/23 for CP.  -States she was having intermittent substernal CP since having the flu a few weeks ago. Pain was reproducible -was having dizziness and was found to be dehydrated likely from the flu, and since has been increasing water and able to eat now. -using zofran  for nausea, has been better now -CXR did not show any acute findings -thought to have inflammation of lung lining and costochondritis and advised to alternate ibuprofen  and tylenol  -states she received a call from [facility] about defibrillator? Will follow up with cardiology -Doing much better today, still feeling fatigued since the flu -watches oxygen  at home, at lowest it would be 92%. Will check 6 min walk to ensure not desating  Current Medication: Outpatient Encounter Medications as of 08/05/2023  Medication Sig   acetaminophen  (TYLENOL ) 500 MG tablet Take 500 mg by mouth every 8 (eight) hours. Take 2 tablets by mouth every 8 hours.   albuterol  (VENTOLIN  HFA) 108 (90 Base) MCG/ACT inhaler Inhale 2 puffs into the lungs every 6 (six) hours as needed for wheezing or shortness of breath.   allopurinol  (ZYLOPRIM ) 100 MG tablet TAKE 1 TABLET EVERY DAY   cholecalciferol (VITAMIN D ) 1000 units tablet Take 1,000 Units by mouth daily.   clotrimazole (LOTRIMIN) 1 % cream Apply 1 Application topically 2 (two) times daily. Apply topically 2x daily.   colchicine  0.6 MG tablet TAKE 1 TABLET EVERY DAY AS NEEDED FOR GOUT FLARE   Cyanocobalamin  (VITAMIN B-12 PO) Take by mouth. Pt takes [place] gummies   diclofenac Sodium (VOLTAREN) 1 % GEL Apply topically 4  (four) times daily.   esomeprazole (NEXIUM) 20 MG capsule Take 20 mg by mouth daily at 12 noon.   fluticasone -salmeterol (ADVAIR) 500-50 MCG/ACT AEPB INHALE 1 PUFF INTO THE LUNGS IN THE MORNING AND AT BEDTIME.   furosemide  (LASIX ) 20 MG tablet TAKE 1 TABLET EVERY DAY   gabapentin  (NEURONTIN ) 300 MG capsule TAKE 1 CAPSULE BY MOUTH IN MORNING, 1 CAPSULE AT MIDDAY AND 3 CAPSULES AT BEDTIME   ipratropium-albuterol  (DUONEB) 0.5-2.5 (3) MG/3ML SOLN Take 3 mLs by nebulization every 4 (four) hours as needed.   lidocaine  4 % Place 1 patch onto the skin daily.   magnesium oxide (MAG-OX) 400 (240 Mg) MG tablet Take 400 mg by mouth 2 (two) times daily.   metoprolol  succinate (TOPROL -XL) 50 MG 24 hr tablet Take 1 tablet (50 mg total) by mouth daily. Take with or immediately following a meal.   montelukast  (SINGULAIR ) 10 MG tablet Take 1 tablet (10 mg total) by mouth at bedtime.   Multiple Vitamin (MULTIVITAMIN) capsule Take 1 capsule by mouth daily.   ondansetron  (ZOFRAN -ODT) 4 MG disintegrating tablet Take 1 tablet (4 mg total) by mouth every 8 (eight) hours as needed for nausea or vomiting.   potassium chloride  SA (KLOR-CON  M) 20 MEQ tablet TAKE 1 TABLET EVERY DAY   rosuvastatin  (CRESTOR ) 10 MG tablet TAKE 1 TABLET TWICE WEEKLY   [DISCONTINUED] alendronate  (FOSAMAX ) 70 MG tablet Take 1 tablet (70 mg total) by mouth every 7 (seven) days. Take with a  full glass of water on an empty stomach.   [DISCONTINUED] benzonatate  (TESSALON ) 200 MG capsule Take 1 capsule (200 mg total) by mouth 3 (three) times daily as needed for cough.   [DISCONTINUED] cyclobenzaprine  (FLEXERIL ) 5 MG tablet Take 1 tablet (5 mg total) by mouth 3 (three) times daily as needed for muscle spasms.   [DISCONTINUED] diphenoxylate -atropine  (LOMOTIL ) 2.5-0.025 MG tablet Take 1 tablet by mouth 4 (four) times daily as needed for diarrhea or loose stools. Alternate with immodium   [DISCONTINUED] Dulaglutide  1.5 MG/0.5ML SOPN Inject 1.5 mg into the  skin once a week.   [DISCONTINUED] HYDROcodone -acetaminophen  (NORCO/VICODIN) 5-325 MG tablet Take 1 tablet by mouth every 6 (six) hours as needed for severe pain (pain score 7-10).   [DISCONTINUED] lisinopril  (ZESTRIL ) 20 MG tablet Take 1 tablet (20 mg total) by mouth daily.   [DISCONTINUED] loratadine  (CLARITIN ) 10 MG tablet Take 1 tablet (10 mg total) by mouth daily.   [DISCONTINUED] meloxicam  (MOBIC ) 15 MG tablet Take 1 tablet (15 mg total) by mouth daily. In am with breakfast   [DISCONTINUED] nystatin  (MYCOSTATIN /NYSTOP ) powder Apply 1 Application topically daily as needed (stoma rash/irritation).   [DISCONTINUED] oseltamivir  (TAMIFLU ) 75 MG capsule Take 1 capsule (75 mg total) by mouth 2 (two) times daily.   [DISCONTINUED] predniSONE  (STERAPRED UNI-PAK 21 TAB) 10 MG (21) TBPK tablet Use as directed for 6 days   [DISCONTINUED] venlafaxine  XR (EFFEXOR -XR) 37.5 MG 24 hr capsule TAKE 1 CAPSULE EVERY DAY   No facility-administered encounter medications on file as of 08/05/2023.    Surgical History: Past Surgical History:  Procedure Laterality Date   ABCESS DRAINAGE  2016   Abdominal abcess due to diverticulitis   caridoverter defibrillator  11/14/2017   ICD   CATARACT EXTRACTION W/PHACO Left 10/26/2015   Procedure: CATARACT EXTRACTION PHACO AND INTRAOCULAR LENS PLACEMENT (IOC) LEFT EYE;  Surgeon: Annell Kidney, MD;  Location: [facility];  Service: Ophthalmology;  Laterality: Left;   CATARACT EXTRACTION W/PHACO Right 12/07/2015   Procedure: CATARACT EXTRACTION PHACO AND INTRAOCULAR LENS PLACEMENT (IOC);  Surgeon: Annell Kidney, MD;  Location: [facility];  Service: Ophthalmology;  Laterality: Right;   CHOLECYSTECTOMY     FOOT SURGERY     Per patient, bilateral foot surgery.   OSTOMY  01/25/2021   OSTOMY  02/11/2021   PELVIC FRACTURE SURGERY     Per patient for cancer.    Medical History: Past Medical History:  Diagnosis Date   Arthritis    Asthma     Cancer (HCC)    Deep vein thrombosis (DVT) (HCC) 2015   Depression    GERD (gastroesophageal reflux disease)    Hypertension    Weakness of both legs     Family History: Family History  Problem Relation Age of Onset   Depression Sister    Heart disease Sister    Early death Sister    Depression Sister    Depression Sister    Depression Sister    Depression Sister     Social History   Socioeconomic History   Marital status: Married    Spouse name: Not on file   Number of children: Not on file   Years of education: Not on file   Highest education level: Not on file  Occupational History   Not on file  Tobacco Use   Smoking status: Never   Smokeless tobacco: Never  Vaping Use   Vaping status: Never Used  Substance and Sexual Activity   Alcohol use: No  Drug use: No   Sexual activity: Not Currently  Other Topics Concern   Not on file  Social History Narrative   Not on file   Social Drivers of Health   Financial Resource Strain: Low Risk  (11/15/2022)   Received from [facility]   Overall Financial Resource Strain (CARDIA)    Difficulty of Paying Living Expenses: Not very hard  Food Insecurity: No Food Insecurity (11/15/2022)   [facility]   Hunger Vital Sign    Worried About Running Out of Food in the Last Year: Never true    Ran Out of Food in the Last Year: Never true  Transportation Needs: No Transportation Needs (11/15/2022)   Received from [facility]   PRAPARE - Transportation    Lack of Transportation (Medical): No    Lack of Transportation (Non-Medical): No  Physical Activity: Not on file  Stress: Not on file  Social Connections: Not on file  Intimate Partner Violence: Not on file      Review of Systems  Constitutional:  Positive for fatigue. Negative for chills and unexpected weight change.  HENT:  Positive for postnasal drip. Negative for congestion, rhinorrhea, sneezing and sore throat.   Eyes:  Negative for  redness.  Respiratory:  Positive for shortness of breath. Negative for cough, chest tightness and wheezing.   Cardiovascular:  Negative for chest pain and palpitations.  Gastrointestinal:  Negative for abdominal pain, constipation, nausea and vomiting.  Genitourinary:  Negative for dysuria and frequency.  Musculoskeletal:  Positive for gait problem. Negative for arthralgias, back pain, joint swelling and neck pain.  Skin:  Negative for rash.  Neurological:  Negative for tremors and numbness.  Hematological:  Negative for adenopathy. Does not bruise/bleed easily.  Psychiatric/Behavioral:  Negative for behavioral problems (Depression), sleep disturbance and suicidal ideas. The patient is not nervous/anxious.     Vital Signs: BP 106/68   Pulse 93   Temp 98.2 F (36.8 C)   Resp 16   Ht 5' (1.524 m)   Wt 243 lb (110.2 kg)   SpO2 96%   BMI 47.46 kg/m    Physical Exam Vitals and nursing note reviewed.  Constitutional:      General: She is not in acute distress.    Appearance: Normal appearance. She is obese. She is not ill-appearing.  HENT:     Head: Normocephalic and atraumatic.  Eyes:     Pupils: Pupils are equal, round, and reactive to light.  Cardiovascular:     Rate and Rhythm: Normal rate and regular rhythm.  Pulmonary:     Effort: Pulmonary effort is normal. No respiratory distress.  Neurological:     Mental Status: She is alert and oriented to person, place, and time.     Gait: Gait abnormal.     Comments: Walking with walker  Psychiatric:        Mood and Affect: Mood normal.        Behavior: Behavior normal.        Assessment/Plan: 1. History of influenza (Primary) Still fatigued, but otherwise feeling better and increasing food and fluid intake  2. Chronic systolic heart failure (HCC) Will follow up with cardiology  3. SOB (shortness of breath) - 6 minute walk   General Counseling: Elsy verbalizes understanding of the findings of todays visit and  agrees with plan of treatment. I have discussed any further diagnostic evaluation that may be needed or ordered today. We also reviewed her medications today. she  has been encouraged to call the office with any questions or concerns that should arise related to todays visit.    Orders Placed This Encounter  Procedures   6 minute walk    No orders of the defined types were placed in this encounter.   This patient was seen by Taylor Favia, PA-C in collaboration with Dr. Verneta Gone as a part of collaborative care agreement.   Total time spent:30 Minutes Time spent includes review of chart, medications, test results, and follow up plan with the patient.      Dr Fozia M Khan Internal medicine

## 2023-08-12 DIAGNOSIS — Z433 Encounter for attention to colostomy: Secondary | ICD-10-CM | POA: Diagnosis not present

## 2023-08-12 DIAGNOSIS — S31101A Unspecified open wound of abdominal wall, left upper quadrant without penetration into peritoneal cavity, initial encounter: Secondary | ICD-10-CM | POA: Diagnosis not present

## 2023-08-12 DIAGNOSIS — S31829A Unspecified open wound of left buttock, initial encounter: Secondary | ICD-10-CM | POA: Diagnosis not present

## 2023-08-12 DIAGNOSIS — Z933 Colostomy status: Secondary | ICD-10-CM | POA: Diagnosis not present

## 2023-08-13 DIAGNOSIS — Z933 Colostomy status: Secondary | ICD-10-CM | POA: Diagnosis not present

## 2023-08-13 DIAGNOSIS — S31101A Unspecified open wound of abdominal wall, left upper quadrant without penetration into peritoneal cavity, initial encounter: Secondary | ICD-10-CM | POA: Diagnosis not present

## 2023-08-13 DIAGNOSIS — S31829A Unspecified open wound of left buttock, initial encounter: Secondary | ICD-10-CM | POA: Diagnosis not present

## 2023-08-13 DIAGNOSIS — Z433 Encounter for attention to colostomy: Secondary | ICD-10-CM | POA: Diagnosis not present

## 2023-08-21 ENCOUNTER — Other Ambulatory Visit: Payer: Self-pay | Admitting: Nurse Practitioner

## 2023-08-21 DIAGNOSIS — M810 Age-related osteoporosis without current pathological fracture: Secondary | ICD-10-CM

## 2023-08-22 ENCOUNTER — Other Ambulatory Visit: Payer: Self-pay

## 2023-08-22 ENCOUNTER — Ambulatory Visit (INDEPENDENT_AMBULATORY_CARE_PROVIDER_SITE_OTHER): Payer: Medicare HMO | Admitting: Nurse Practitioner

## 2023-08-22 ENCOUNTER — Encounter: Payer: Self-pay | Admitting: Nurse Practitioner

## 2023-08-22 VITALS — BP 138/62 | HR 78 | Temp 98.4°F | Resp 16 | Ht 60.0 in | Wt 252.2 lb

## 2023-08-22 DIAGNOSIS — E662 Morbid (severe) obesity with alveolar hypoventilation: Secondary | ICD-10-CM

## 2023-08-22 DIAGNOSIS — G8929 Other chronic pain: Secondary | ICD-10-CM

## 2023-08-22 DIAGNOSIS — M5441 Lumbago with sciatica, right side: Secondary | ICD-10-CM

## 2023-08-22 DIAGNOSIS — I5022 Chronic systolic (congestive) heart failure: Secondary | ICD-10-CM

## 2023-08-22 DIAGNOSIS — I10 Essential (primary) hypertension: Secondary | ICD-10-CM

## 2023-08-22 DIAGNOSIS — I700 Atherosclerosis of aorta: Secondary | ICD-10-CM

## 2023-08-22 DIAGNOSIS — M5442 Lumbago with sciatica, left side: Secondary | ICD-10-CM | POA: Diagnosis not present

## 2023-08-22 DIAGNOSIS — Z79899 Other long term (current) drug therapy: Secondary | ICD-10-CM

## 2023-08-22 DIAGNOSIS — Z6841 Body Mass Index (BMI) 40.0 and over, adult: Secondary | ICD-10-CM

## 2023-08-22 DIAGNOSIS — E66813 Obesity, class 3: Secondary | ICD-10-CM

## 2023-08-22 MED ORDER — NYSTATIN 100000 UNIT/GM EX POWD: 1.0000 | Freq: Every day | CUTANEOUS | 2 refills | Status: AC | PRN

## 2023-08-22 MED ORDER — LISINOPRIL 20 MG PO TABS: 20.0000 mg | ORAL_TABLET | Freq: Every day | ORAL | 3 refills | Status: DC

## 2023-08-22 MED ORDER — DIPHENOXYLATE-ATROPINE 2.5-0.025 MG PO TABS: 1.0000 | ORAL_TABLET | Freq: Four times a day (QID) | ORAL | 0 refills | Status: AC | PRN

## 2023-08-22 MED ORDER — VENLAFAXINE HCL ER 37.5 MG PO CP24: 37.5000 mg | ORAL_CAPSULE | Freq: Every day | ORAL | 3 refills | Status: AC

## 2023-08-22 MED ORDER — MELOXICAM 15 MG PO TABS: 15.0000 mg | ORAL_TABLET | Freq: Every day | ORAL | 5 refills | Status: AC

## 2023-08-22 MED ORDER — HYDROCODONE-ACETAMINOPHEN 5-325 MG PO TABS: 1.0000 | ORAL_TABLET | Freq: Every day | ORAL | 0 refills | Status: DC | PRN

## 2023-08-22 MED ORDER — DULAGLUTIDE 3 MG/0.5ML ~~LOC~~ SOAJ: 3.0000 mg | SUBCUTANEOUS | 5 refills | Status: DC

## 2023-08-22 MED ORDER — LORATADINE 10 MG PO TABS: 10.0000 mg | ORAL_TABLET | Freq: Every day | ORAL | 3 refills | Status: AC

## 2023-08-22 NOTE — Progress Notes (Signed)
 [facility] [address] [place], [place] [address]  Internal MEDICINE  Office Visit Note  Patient Name: Diana Bernard  [id number]  [id number]  Date of Service: 08/22/2023  Chief Complaint  Patient presents with   Depression   Gastroesophageal Reflux   Hypertension   Follow-up    HPI Bianco presents for a follow-up visit for hypertension, high cholesterol, heart failure, obesity and refills.  Chronic back pain and left knee pain -- taking hydrocodone  as needed.  Hypertension -- controlled with metoprolol  Heart failure -- followed by cardiology. High cholesterol -- takes rosuvastatin   Obesity -- has been taking trulicity  1.5 mg weekly, wants to increase the dose  Prediabetes -- taking trulicity  once a week.     Current Medication: Outpatient Encounter Medications as of 08/22/2023  Medication Sig   acetaminophen  (TYLENOL ) 500 MG tablet Take 500 mg by mouth every 8 (eight) hours. Take 2 tablets by mouth every 8 hours.   albuterol  (VENTOLIN  HFA) 108 (90 Base) MCG/ACT inhaler Inhale 2 puffs into the lungs every 6 (six) hours as needed for wheezing or shortness of breath.   alendronate  (FOSAMAX ) 70 MG tablet TAKE 1 TABLET (70 MG TOTAL) BY MOUTH EVERY 7 (SEVEN) DAYS. TAKE WITH A FULL GLASS OF WATER ON AN EMPTY STOMACH.   allopurinol  (ZYLOPRIM ) 100 MG tablet TAKE 1 TABLET EVERY DAY   cholecalciferol (VITAMIN D ) 1000 units tablet Take 1,000 Units by mouth daily.   clotrimazole (LOTRIMIN) 1 % cream Apply 1 Application topically 2 (two) times daily. Apply topically 2x daily.   colchicine  0.6 MG tablet TAKE 1 TABLET EVERY DAY AS NEEDED FOR GOUT FLARE   Cyanocobalamin  (VITAMIN B-12 PO) Take by mouth. Pt takes [place] gummies   diclofenac Sodium (VOLTAREN) 1 % GEL Apply topically 4 (four) times daily.   Dulaglutide  3 MG/0.5ML SOAJ Inject 3 mg into the skin once a week.   esomeprazole (NEXIUM) 20 MG capsule Take 20 mg by mouth daily at 12 noon.    fluticasone -salmeterol (ADVAIR) 500-50 MCG/ACT AEPB INHALE 1 PUFF INTO THE LUNGS IN THE MORNING AND AT BEDTIME.   furosemide  (LASIX ) 20 MG tablet TAKE 1 TABLET EVERY DAY   gabapentin  (NEURONTIN ) 300 MG capsule TAKE 1 CAPSULE BY MOUTH IN MORNING, 1 CAPSULE AT MIDDAY AND 3 CAPSULES AT BEDTIME   ipratropium-albuterol  (DUONEB) 0.5-2.5 (3) MG/3ML SOLN Take 3 mLs by nebulization every 4 (four) hours as needed.   lidocaine  4 % Place 1 patch onto the skin daily.   magnesium oxide (MAG-OX) 400 (240 Mg) MG tablet Take 400 mg by mouth 2 (two) times daily.   metoprolol  succinate (TOPROL -XL) 50 MG 24 hr tablet Take 1 tablet (50 mg total) by mouth daily. Take with or immediately following a meal.   montelukast  (SINGULAIR ) 10 MG tablet Take 1 tablet (10 mg total) by mouth at bedtime.   Multiple Vitamin (MULTIVITAMIN) capsule Take 1 capsule by mouth daily.   ondansetron  (ZOFRAN -ODT) 4 MG disintegrating tablet Take 1 tablet (4 mg total) by mouth every 8 (eight) hours as needed for nausea or vomiting.   potassium chloride  SA (KLOR-CON  M) 20 MEQ tablet TAKE 1 TABLET EVERY DAY   rosuvastatin  (CRESTOR ) 10 MG tablet TAKE 1 TABLET TWICE WEEKLY   [DISCONTINUED] benzonatate  (TESSALON ) 200 MG capsule Take 1 capsule (200 mg total) by mouth 3 (three) times daily as needed for cough.   [DISCONTINUED] diphenoxylate -atropine  (LOMOTIL ) 2.5-0.025 MG tablet Take 1 tablet by mouth 4 (four) times daily as needed for diarrhea or  loose stools. Alternate with immodium   [DISCONTINUED] Dulaglutide  1.5 MG/0.5ML SOPN Inject 1.5 mg into the skin once a week.   [DISCONTINUED] HYDROcodone -acetaminophen  (NORCO/VICODIN) 5-325 MG tablet Take 1 tablet by mouth every 6 (six) hours as needed for severe pain (pain score 7-10).   [DISCONTINUED] lisinopril  (ZESTRIL ) 20 MG tablet Take 1 tablet (20 mg total) by mouth daily.   [DISCONTINUED] loratadine  (CLARITIN ) 10 MG tablet Take 1 tablet (10 mg total) by mouth daily.   [DISCONTINUED] meloxicam  (MOBIC )  15 MG tablet Take 1 tablet (15 mg total) by mouth daily. In am with breakfast   [DISCONTINUED] nystatin  (MYCOSTATIN /NYSTOP ) powder Apply 1 Application topically daily as needed (stoma rash/irritation).   [DISCONTINUED] oseltamivir  (TAMIFLU ) 75 MG capsule Take 1 capsule (75 mg total) by mouth 2 (two) times daily.   [DISCONTINUED] predniSONE  (STERAPRED UNI-PAK 21 TAB) 10 MG (21) TBPK tablet Use as directed for 6 days   [DISCONTINUED] venlafaxine  XR (EFFEXOR -XR) 37.5 MG 24 hr capsule TAKE 1 CAPSULE EVERY DAY   diphenoxylate -atropine  (LOMOTIL ) 2.5-0.025 MG tablet Take 1 tablet by mouth 4 (four) times daily as needed for diarrhea or loose stools. Alternate with immodium   HYDROcodone -acetaminophen  (NORCO/VICODIN) 5-325 MG tablet Take 1 tablet by mouth daily as needed for severe pain (pain score 7-10).   lisinopril  (ZESTRIL ) 20 MG tablet Take 1 tablet (20 mg total) by mouth daily.   loratadine  (CLARITIN ) 10 MG tablet Take 1 tablet (10 mg total) by mouth daily.   meloxicam  (MOBIC ) 15 MG tablet Take 1 tablet (15 mg total) by mouth daily. In am with breakfast. Do not take with ibuprofen    nystatin  (MYCOSTATIN /NYSTOP ) powder Apply 1 Application topically daily as needed (stoma rash/irritation).   venlafaxine  XR (EFFEXOR -XR) 37.5 MG 24 hr capsule Take 1 capsule (37.5 mg total) by mouth daily.   No facility-administered encounter medications on file as of 08/22/2023.    Surgical History: Past Surgical History:  Procedure Laterality Date   ABCESS DRAINAGE  2016   Abdominal abcess due to diverticulitis   caridoverter defibrillator  11/14/2017   ICD   CATARACT EXTRACTION W/PHACO Left 10/26/2015   Procedure: CATARACT EXTRACTION PHACO AND INTRAOCULAR LENS PLACEMENT (IOC) LEFT EYE;  Surgeon: Annell Kidney, MD;  Location: [facility];  Service: Ophthalmology;  Laterality: Left;   CATARACT EXTRACTION W/PHACO Right 12/07/2015   Procedure: CATARACT EXTRACTION PHACO AND INTRAOCULAR LENS PLACEMENT  (IOC);  Surgeon: Annell Kidney, MD;  Location: [facility];  Service: Ophthalmology;  Laterality: Right;   CHOLECYSTECTOMY     FOOT SURGERY     Per patient, bilateral foot surgery.   OSTOMY  01/25/2021   OSTOMY  02/11/2021   PELVIC FRACTURE SURGERY     Per patient for cancer.    Medical History: Past Medical History:  Diagnosis Date   Arthritis    Asthma    Cancer (HCC)    Deep vein thrombosis (DVT) (HCC) 2015   Depression    GERD (gastroesophageal reflux disease)    Hypertension    Weakness of both legs     Family History: Family History  Problem Relation Age of Onset   Depression Sister    Heart disease Sister    Early death Sister    Depression Sister    Depression Sister    Depression Sister    Depression Sister     Social History   Socioeconomic History   Marital status: Married    Spouse name: Not on file   Number of children: Not on file  Years of education: Not on file   Highest education level: Not on file  Occupational History   Not on file  Tobacco Use   Smoking status: Never   Smokeless tobacco: Never  Vaping Use   Vaping status: Never Used  Substance and Sexual Activity   Alcohol use: No   Drug use: No   Sexual activity: Not Currently  Other Topics Concern   Not on file  Social History Narrative   Not on file   Social Drivers of Health   Financial Resource Strain: Low Risk  (11/15/2022)   [facility]   Overall Financial Resource Strain (CARDIA)    Difficulty of Paying Living Expenses: Not very hard  Food Insecurity: No Food Insecurity (11/15/2022)   Received from [facility]   Hunger Vital Sign    Worried About Running Out of Food in the Last Year: Never true    Ran Out of Food in the Last Year: Never true  Transportation Needs: No Transportation Needs (11/15/2022)   Received from [facility]   PRAPARE - Transportation    Lack of Transportation (Medical): No    Lack of Transportation  (Non-Medical): No  Physical Activity: Not on file  Stress: Not on file  Social Connections: Not on file  Intimate Partner Violence: Not on file      Review of Systems  Constitutional:  Positive for activity change and fatigue. Negative for fever. Unexpected weight change: gained 9 lbs since last visit. HENT: Negative.  Negative for congestion, mouth sores and postnasal drip.   Respiratory:  Negative for cough, chest tightness, shortness of breath and wheezing.   Cardiovascular:  Positive for leg swelling. Negative for chest pain and palpitations.  Gastrointestinal:  Positive for diarrhea (intermittent). Negative for constipation, nausea and vomiting.  Genitourinary:  Negative for flank pain.  Musculoskeletal:  Positive for arthralgias, back pain and gait problem.  Psychiatric/Behavioral:  Positive for depression.     Vital Signs: BP 138/62 Comment: 142/66  Pulse 78   Temp 98.4 F (36.9 C)   Resp 16   Ht 5' (1.524 m)   Wt 252 lb 3.2 oz (114.4 kg)   SpO2 95%   BMI 49.25 kg/m    Physical Exam Vitals reviewed.  Constitutional:      General: She is not in acute distress.    Appearance: Normal appearance. She is obese. She is not ill-appearing.  HENT:     Head: Normocephalic and atraumatic.  Eyes:     Pupils: Pupils are equal, round, and reactive to light.  Cardiovascular:     Rate and Rhythm: Normal rate and regular rhythm.  Pulmonary:     Effort: Pulmonary effort is normal. No respiratory distress.  Neurological:     Mental Status: She is alert and oriented to person, place, and time.  Psychiatric:        Mood and Affect: Mood normal.        Behavior: Behavior normal.        Assessment/Plan: 1. Essential hypertension (Primary) Stable, continue lisinopril  as prescribed.  - lisinopril  (ZESTRIL ) 20 MG tablet; Take 1 tablet (20 mg total) by mouth daily.  Dispense: 90 tablet; Refill: 3  2. Chronic systolic heart failure (HCC) Continue follow up with  cardiology  3. Aortic atherosclerosis (HCC) Continue rosuvastatin  as prescribed.   4. Chronic bilateral low back pain with bilateral sciatica Continue meloxicam  and prn hydrocodone  as prescribed. Follow up in 3 months for additional refills do  not take any ibuprofen  while taking meloxicam   - HYDROcodone -acetaminophen  (NORCO/VICODIN) 5-325 MG tablet; Take 1 tablet by mouth daily as needed for severe pain (pain score 7-10).  Dispense: 45 tablet; Refill: 0 - meloxicam  (MOBIC ) 15 MG tablet; Take 1 tablet (15 mg total) by mouth daily. In am with breakfast. Do not take with ibuprofen   Dispense: 30 tablet; Refill: 5  5. Class 3 obesity with alveolar hypoventilation, serious comorbidity, and body mass index (BMI) of 45.0 to 49.9 in adult [facility]) Trulicity  dose increased to 3 mg weekly.  - Dulaglutide  3 MG/0.5ML SOAJ; Inject 3 mg into the skin once a week.  Dispense: 2 mL; Refill: 5  6. Encounter for medication review Medication list reviewed, updated and refills ordered  - loratadine  (CLARITIN ) 10 MG tablet; Take 1 tablet (10 mg total) by mouth daily.  Dispense: 90 tablet; Refill: 3 - diphenoxylate -atropine  (LOMOTIL ) 2.5-0.025 MG tablet; Take 1 tablet by mouth 4 (four) times daily as needed for diarrhea or loose stools. Alternate with immodium  Dispense: 30 tablet; Refill: 0 - nystatin  (MYCOSTATIN /NYSTOP ) powder; Apply 1 Application topically daily as needed (stoma rash/irritation).  Dispense: 60 g; Refill: 2 - venlafaxine  XR (EFFEXOR -XR) 37.5 MG 24 hr capsule; Take 1 capsule (37.5 mg total) by mouth daily.  Dispense: 90 capsule; Refill: 3 - Dulaglutide  3 MG/0.5ML SOAJ; Inject 3 mg into the skin once a week.  Dispense: 2 mL; Refill: 5   General Counseling: Tarhonda verbalizes understanding of the findings of todays visit and agrees with plan of treatment. I have discussed any further diagnostic evaluation that may be needed or ordered today. We also reviewed her medications today. she has been encouraged  to call the office with any questions or concerns that should arise related to todays visit.    No orders of the defined types were placed in this encounter.   Meds ordered this encounter  Medications   HYDROcodone -acetaminophen  (NORCO/VICODIN) 5-325 MG tablet    Sig: Take 1 tablet by mouth daily as needed for severe pain (pain score 7-10).    Dispense:  45 tablet    Refill:  0    For refill   lisinopril  (ZESTRIL ) 20 MG tablet    Sig: Take 1 tablet (20 mg total) by mouth daily.    Dispense:  90 tablet    Refill:  3   loratadine  (CLARITIN ) 10 MG tablet    Sig: Take 1 tablet (10 mg total) by mouth daily.    Dispense:  90 tablet    Refill:  3   meloxicam  (MOBIC ) 15 MG tablet    Sig: Take 1 tablet (15 mg total) by mouth daily. In am with breakfast. Do not take with ibuprofen     Dispense:  30 tablet    Refill:  5    Fill new script today   diphenoxylate -atropine  (LOMOTIL ) 2.5-0.025 MG tablet    Sig: Take 1 tablet by mouth 4 (four) times daily as needed for diarrhea or loose stools. Alternate with immodium    Dispense:  30 tablet    Refill:  0    Fill new script today.   nystatin  (MYCOSTATIN /NYSTOP ) powder    Sig: Apply 1 Application topically daily as needed (stoma rash/irritation).    Dispense:  60 g    Refill:  2    Fill today   venlafaxine  XR (EFFEXOR -XR) 37.5 MG 24 hr capsule    Sig: Take 1 capsule (37.5 mg total) by mouth daily.    Dispense:  90 capsule  Refill:  3   Dulaglutide  3 MG/0.5ML SOAJ    Sig: Inject 3 mg into the skin once a week.    Dispense:  2 mL    Refill:  5    Dx code E11.65    Return in about 3 months (around 11/11/2023) for F/U, pain med refill, Decker Cogdell PCP.   Total time spent:30 Minutes Time spent includes review of chart, medications, test results, and follow up plan with the patient.   [place] Controlled Substance Database was reviewed by me.  This patient was seen by Laurence Pons, FNP-C in collaboration with Dr. Verneta Gone as a part of  collaborative care agreement.   Quinlan Vollmer R. Bobbi Burow, MSN, FNP-C Internal medicine

## 2023-09-02 DIAGNOSIS — K219 Gastro-esophageal reflux disease without esophagitis: Secondary | ICD-10-CM | POA: Diagnosis not present

## 2023-09-02 DIAGNOSIS — I5022 Chronic systolic (congestive) heart failure: Secondary | ICD-10-CM | POA: Diagnosis not present

## 2023-09-02 DIAGNOSIS — I130 Hypertensive heart and chronic kidney disease with heart failure and stage 1 through stage 4 chronic kidney disease, or unspecified chronic kidney disease: Secondary | ICD-10-CM | POA: Diagnosis not present

## 2023-09-02 DIAGNOSIS — Z4502 Encounter for adjustment and management of automatic implantable cardiac defibrillator: Secondary | ICD-10-CM | POA: Diagnosis not present

## 2023-09-02 DIAGNOSIS — Z6841 Body Mass Index (BMI) 40.0 and over, adult: Secondary | ICD-10-CM | POA: Diagnosis not present

## 2023-09-02 DIAGNOSIS — I428 Other cardiomyopathies: Secondary | ICD-10-CM | POA: Diagnosis not present

## 2023-09-02 DIAGNOSIS — E669 Obesity, unspecified: Secondary | ICD-10-CM | POA: Diagnosis not present

## 2023-09-02 DIAGNOSIS — I447 Left bundle-branch block, unspecified: Secondary | ICD-10-CM | POA: Diagnosis not present

## 2023-09-02 DIAGNOSIS — Z95810 Presence of automatic (implantable) cardiac defibrillator: Secondary | ICD-10-CM | POA: Diagnosis not present

## 2023-09-02 DIAGNOSIS — J449 Chronic obstructive pulmonary disease, unspecified: Secondary | ICD-10-CM | POA: Diagnosis not present

## 2023-09-02 DIAGNOSIS — N189 Chronic kidney disease, unspecified: Secondary | ICD-10-CM | POA: Diagnosis not present

## 2023-09-04 DIAGNOSIS — Z4502 Encounter for adjustment and management of automatic implantable cardiac defibrillator: Secondary | ICD-10-CM | POA: Diagnosis not present

## 2023-09-11 DIAGNOSIS — Z933 Colostomy status: Secondary | ICD-10-CM | POA: Diagnosis not present

## 2023-09-11 DIAGNOSIS — Z433 Encounter for attention to colostomy: Secondary | ICD-10-CM | POA: Diagnosis not present

## 2023-09-11 DIAGNOSIS — S31829A Unspecified open wound of left buttock, initial encounter: Secondary | ICD-10-CM | POA: Diagnosis not present

## 2023-09-11 DIAGNOSIS — S31101A Unspecified open wound of abdominal wall, left upper quadrant without penetration into peritoneal cavity, initial encounter: Secondary | ICD-10-CM | POA: Diagnosis not present

## 2023-09-18 DIAGNOSIS — C20 Malignant neoplasm of rectum: Secondary | ICD-10-CM | POA: Diagnosis not present

## 2023-09-18 DIAGNOSIS — N393 Stress incontinence (female) (male): Secondary | ICD-10-CM | POA: Diagnosis not present

## 2023-09-18 DIAGNOSIS — N39490 Overflow incontinence: Secondary | ICD-10-CM | POA: Diagnosis not present

## 2023-09-18 DIAGNOSIS — N3941 Urge incontinence: Secondary | ICD-10-CM | POA: Diagnosis not present

## 2023-09-25 ENCOUNTER — Encounter: Payer: Self-pay | Admitting: Nurse Practitioner

## 2023-09-25 DIAGNOSIS — N3941 Urge incontinence: Secondary | ICD-10-CM | POA: Diagnosis not present

## 2023-09-25 DIAGNOSIS — N39490 Overflow incontinence: Secondary | ICD-10-CM | POA: Diagnosis not present

## 2023-09-25 DIAGNOSIS — N393 Stress incontinence (female) (male): Secondary | ICD-10-CM | POA: Diagnosis not present

## 2023-09-26 ENCOUNTER — Ambulatory Visit (INDEPENDENT_AMBULATORY_CARE_PROVIDER_SITE_OTHER): Admitting: Nurse Practitioner

## 2023-09-26 ENCOUNTER — Encounter: Payer: Self-pay | Admitting: Nurse Practitioner

## 2023-09-26 VITALS — BP 116/60 | HR 74 | Temp 98.2°F | Resp 16 | Ht 60.0 in | Wt 250.0 lb

## 2023-09-26 DIAGNOSIS — M5441 Lumbago with sciatica, right side: Secondary | ICD-10-CM

## 2023-09-26 DIAGNOSIS — M5442 Lumbago with sciatica, left side: Secondary | ICD-10-CM | POA: Diagnosis not present

## 2023-09-26 DIAGNOSIS — N1831 Chronic kidney disease, stage 3a: Secondary | ICD-10-CM

## 2023-09-26 DIAGNOSIS — N390 Urinary tract infection, site not specified: Secondary | ICD-10-CM | POA: Diagnosis not present

## 2023-09-26 DIAGNOSIS — I10 Essential (primary) hypertension: Secondary | ICD-10-CM | POA: Diagnosis not present

## 2023-09-26 DIAGNOSIS — G8929 Other chronic pain: Secondary | ICD-10-CM | POA: Diagnosis not present

## 2023-09-26 MED ORDER — HYDROCODONE-ACETAMINOPHEN 5-325 MG PO TABS: 1.0000 | ORAL_TABLET | Freq: Every day | ORAL | 0 refills | Status: DC | PRN

## 2023-09-26 MED ORDER — NITROFURANTOIN MONOHYD MACRO 100 MG PO CAPS: 100.0000 mg | ORAL_CAPSULE | Freq: Two times a day (BID) | ORAL | 0 refills | Status: AC

## 2023-09-26 NOTE — Progress Notes (Signed)
 [facility] [address] [place], [place] [address]  Internal MEDICINE  Office Visit Note  Patient Name: Diana Bernard  [id number]  [id number]  Date of Service: 09/26/2023  Chief Complaint  Patient presents with   Acute Visit    Dark urine.     HPI Rosette presents for an acute sick visit for possible UTI --onset of symptoms was last night --reports dark cloudy urine and some nausea.  --unable to urinate at the visit today --lisinopril  dose increased to 40 mg a few weeks ago.      Current Medication:  Outpatient Encounter Medications as of 09/26/2023  Medication Sig   acetaminophen  (TYLENOL ) 500 MG tablet Take 500 mg by mouth every 8 (eight) hours. Take 2 tablets by mouth every 8 hours.   albuterol  (VENTOLIN  HFA) 108 (90 Base) MCG/ACT inhaler Inhale 2 puffs into the lungs every 6 (six) hours as needed for wheezing or shortness of breath.   alendronate  (FOSAMAX ) 70 MG tablet TAKE 1 TABLET (70 MG TOTAL) BY MOUTH EVERY 7 (SEVEN) DAYS. TAKE WITH A FULL GLASS OF WATER ON AN EMPTY STOMACH.   allopurinol  (ZYLOPRIM ) 100 MG tablet TAKE 1 TABLET EVERY DAY   cholecalciferol (VITAMIN D ) 1000 units tablet Take 1,000 Units by mouth daily.   clotrimazole (LOTRIMIN) 1 % cream Apply 1 Application topically 2 (two) times daily. Apply topically 2x daily.   colchicine  0.6 MG tablet TAKE 1 TABLET EVERY DAY AS NEEDED FOR GOUT FLARE   Cyanocobalamin  (VITAMIN B-12 PO) Take by mouth. Pt takes [place] gummies   diclofenac Sodium (VOLTAREN) 1 % GEL Apply topically 4 (four) times daily.   diphenoxylate -atropine  (LOMOTIL ) 2.5-0.025 MG tablet Take 1 tablet by mouth 4 (four) times daily as needed for diarrhea or loose stools. Alternate with immodium   Dulaglutide  3 MG/0.5ML SOAJ Inject 3 mg into the skin once a week.   esomeprazole (NEXIUM) 20 MG capsule Take 20 mg by mouth daily at 12 noon.   fluticasone -salmeterol (ADVAIR) 500-50 MCG/ACT AEPB INHALE 1 PUFF INTO THE LUNGS IN THE  MORNING AND AT BEDTIME.   furosemide  (LASIX ) 20 MG tablet TAKE 1 TABLET EVERY DAY   gabapentin  (NEURONTIN ) 300 MG capsule TAKE 1 CAPSULE BY MOUTH IN MORNING, 1 CAPSULE AT MIDDAY AND 3 CAPSULES AT BEDTIME   ipratropium-albuterol  (DUONEB) 0.5-2.5 (3) MG/3ML SOLN Take 3 mLs by nebulization every 4 (four) hours as needed.   lidocaine  4 % Place 1 patch onto the skin daily.   loratadine  (CLARITIN ) 10 MG tablet Take 1 tablet (10 mg total) by mouth daily.   magnesium oxide (MAG-OX) 400 (240 Mg) MG tablet Take 400 mg by mouth 2 (two) times daily.   meloxicam  (MOBIC ) 15 MG tablet Take 1 tablet (15 mg total) by mouth daily. In am with breakfast. Do not take with ibuprofen    metoprolol  succinate (TOPROL -XL) 50 MG 24 hr tablet Take 1 tablet (50 mg total) by mouth daily. Take with or immediately following a meal.   montelukast  (SINGULAIR ) 10 MG tablet Take 1 tablet (10 mg total) by mouth at bedtime.   Multiple Vitamin (MULTIVITAMIN) capsule Take 1 capsule by mouth daily.   nitrofurantoin , macrocrystal-monohydrate, (MACROBID ) 100 MG capsule Take 1 capsule (100 mg total) by mouth 2 (two) times daily for 7 days. Take with food   nystatin  (MYCOSTATIN /NYSTOP ) powder Apply 1 Application topically daily as needed (stoma rash/irritation).   ondansetron  (ZOFRAN -ODT) 4 MG disintegrating tablet Take 1 tablet (4 mg total) by mouth every 8 (eight) hours  as needed for nausea or vomiting.   potassium chloride  SA (KLOR-CON  M) 20 MEQ tablet TAKE 1 TABLET EVERY DAY   rosuvastatin  (CRESTOR ) 10 MG tablet TAKE 1 TABLET TWICE WEEKLY   venlafaxine  XR (EFFEXOR -XR) 37.5 MG 24 hr capsule Take 1 capsule (37.5 mg total) by mouth daily.   [DISCONTINUED] HYDROcodone -acetaminophen  (NORCO/VICODIN) 5-325 MG tablet Take 1 tablet by mouth daily as needed for severe pain (pain score 7-10).   [DISCONTINUED] lisinopril  (ZESTRIL ) 20 MG tablet Take 1 tablet (20 mg total) by mouth daily.   HYDROcodone -acetaminophen  (NORCO/VICODIN) 5-325 MG tablet Take  1 tablet by mouth daily as needed for severe pain (pain score 7-10).   lisinopril  (ZESTRIL ) 40 MG tablet Take 40 mg by mouth daily.   No facility-administered encounter medications on file as of 09/26/2023.      Medical History: Past Medical History:  Diagnosis Date   Arthritis    Asthma    Cancer (HCC)    Deep vein thrombosis (DVT) (HCC) 2015   Depression    GERD (gastroesophageal reflux disease)    Hypertension    Weakness of both legs      Vital Signs: BP 116/60   Pulse 74   Temp 98.2 F (36.8 C)   Resp 16   Ht 5' (1.524 m)   Wt 250 lb (113.4 kg)   SpO2 93%   BMI 48.82 kg/m    Review of Systems  Constitutional:  Positive for fatigue.  Respiratory: Negative.  Negative for cough, chest tightness, shortness of breath and wheezing.   Cardiovascular: Negative.  Negative for chest pain and palpitations.  Gastrointestinal: Negative.   Genitourinary:  Positive for dysuria.       Dark cloudy urine  Musculoskeletal:  Positive for arthralgias, back pain and gait problem.  Neurological:  Positive for weakness.    Physical Exam Vitals reviewed.  Constitutional:      General: She is not in acute distress.    Appearance: Normal appearance. She is obese. She is ill-appearing.  HENT:     Head: Normocephalic and atraumatic.  Eyes:     Pupils: Pupils are equal, round, and reactive to light.  Cardiovascular:     Rate and Rhythm: Normal rate and regular rhythm.  Pulmonary:     Effort: Pulmonary effort is normal. No respiratory distress.  Skin:    Capillary Refill: Capillary refill takes less than 2 seconds.  Neurological:     Mental Status: She is alert and oriented to person, place, and time.     Motor: Weakness present.     Gait: Gait abnormal.  Psychiatric:        Mood and Affect: Mood normal.        Behavior: Behavior normal.       Assessment/Plan: 1. Urinary tract infection without hematuria, site unspecified (Primary) Macrobid  prescribed, take until gone.   - Basic Metabolic Panel (BMET) - nitrofurantoin , macrocrystal-monohydrate, (MACROBID ) 100 MG capsule; Take 1 capsule (100 mg total) by mouth 2 (two) times daily for 7 days. Take with food  Dispense: 14 capsule; Refill: 0  2. Essential hypertension Stable, continue lisinopril  as prescribed.  - lisinopril  (ZESTRIL ) 40 MG tablet; Take 40 mg by mouth daily.  3. Stage 3a chronic kidney disease (HCC) Repeat lab ordered. Lisinopril  dose was increased to 40 mg - lisinopril  (ZESTRIL ) 40 MG tablet; Take 40 mg by mouth daily. - Basic Metabolic Panel (BMET)  4. Chronic bilateral low back pain with bilateral sciatica Continue hydrocodone  as needed as prescribed.  -  HYDROcodone -acetaminophen  (NORCO/VICODIN) 5-325 MG tablet; Take 1 tablet by mouth daily as needed for severe pain (pain score 7-10).  Dispense: 45 tablet; Refill: 0   General Counseling: Diyana verbalizes understanding of the findings of todays visit and agrees with plan of treatment. I have discussed any further diagnostic evaluation that may be needed or ordered today. We also reviewed her medications today. she has been encouraged to call the office with any questions or concerns that should arise related to todays visit.    Counseling:    Orders Placed This Encounter  Procedures   Basic Metabolic Panel (BMET)    Meds ordered this encounter  Medications   nitrofurantoin , macrocrystal-monohydrate, (MACROBID ) 100 MG capsule    Sig: Take 1 capsule (100 mg total) by mouth 2 (two) times daily for 7 days. Take with food    Dispense:  14 capsule    Refill:  0    Fill new script today   HYDROcodone -acetaminophen  (NORCO/VICODIN) 5-325 MG tablet    Sig: Take 1 tablet by mouth daily as needed for severe pain (pain score 7-10).    Dispense:  45 tablet    Refill:  0    For refill    Return if symptoms worsen or fail to improve, for keep regular scheduled follow up.  [place] Controlled Substance Database was reviewed by me for overdose  risk score (ORS)  Time spent:30 Minutes Time spent with patient included reviewing progress notes, labs, imaging studies, and discussing plan for follow up.   This patient was seen by Laurence Pons, FNP-C in collaboration with Dr. Verneta Gone as a part of collaborative care agreement.  Aleem Elza R. Bobbi Burow, MSN, FNP-C Internal Medicine

## 2023-09-29 ENCOUNTER — Encounter: Payer: Self-pay | Admitting: Nurse Practitioner

## 2023-09-30 DIAGNOSIS — N390 Urinary tract infection, site not specified: Secondary | ICD-10-CM | POA: Diagnosis not present

## 2023-09-30 DIAGNOSIS — N1831 Chronic kidney disease, stage 3a: Secondary | ICD-10-CM | POA: Diagnosis not present

## 2023-10-01 LAB — BASIC METABOLIC PANEL WITH GFR
BUN/Creatinine Ratio: 18 (ref 12–28)
BUN: 25 mg/dL (ref 8–27)
CO2: 21 mmol/L (ref 20–29)
Calcium: 9.4 mg/dL (ref 8.7–10.3)
Chloride: 102 mmol/L (ref 96–106)
Creatinine, Ser: 1.4 mg/dL — ABNORMAL HIGH (ref 0.57–1.00)
Glucose: 92 mg/dL (ref 70–99)
Potassium: 5.3 mmol/L — ABNORMAL HIGH (ref 3.5–5.2)
Sodium: 142 mmol/L (ref 134–144)
eGFR: 39 mL/min/{1.73_m2} — ABNORMAL LOW (ref >59)

## 2023-10-05 DIAGNOSIS — I509 Heart failure, unspecified: Secondary | ICD-10-CM | POA: Diagnosis not present

## 2023-10-05 DIAGNOSIS — Z4502 Encounter for adjustment and management of automatic implantable cardiac defibrillator: Secondary | ICD-10-CM | POA: Diagnosis not present

## 2023-10-05 DIAGNOSIS — Z95810 Presence of automatic (implantable) cardiac defibrillator: Secondary | ICD-10-CM | POA: Diagnosis not present

## 2023-10-08 ENCOUNTER — Ambulatory Visit: Payer: Self-pay | Admitting: Nurse Practitioner

## 2023-10-08 NOTE — Progress Notes (Signed)
 Will discuss results at upcoming appt.

## 2023-10-14 ENCOUNTER — Telehealth: Payer: Self-pay

## 2023-10-14 DIAGNOSIS — G8929 Other chronic pain: Secondary | ICD-10-CM

## 2023-10-17 ENCOUNTER — Telehealth: Payer: Self-pay | Admitting: Nurse Practitioner

## 2023-10-17 NOTE — Telephone Encounter (Signed)
 Done

## 2023-10-17 NOTE — Telephone Encounter (Signed)
 Orthopedic sent via [facility] to [facility]

## 2023-10-23 ENCOUNTER — Telehealth: Payer: Self-pay

## 2023-10-23 DIAGNOSIS — G8929 Other chronic pain: Secondary | ICD-10-CM

## 2023-10-24 ENCOUNTER — Other Ambulatory Visit: Payer: Self-pay | Admitting: Nurse Practitioner

## 2023-10-24 DIAGNOSIS — I10 Essential (primary) hypertension: Secondary | ICD-10-CM

## 2023-10-24 MED ORDER — HYDROCODONE-ACETAMINOPHEN 5-325 MG PO TABS: 1.0000 | ORAL_TABLET | Freq: Every day | ORAL | 0 refills | Status: DC | PRN

## 2023-10-24 NOTE — Telephone Encounter (Signed)
 Pt daughter advised we sent med

## 2023-10-29 ENCOUNTER — Telehealth: Payer: Self-pay | Admitting: Nurse Practitioner

## 2023-10-29 NOTE — Telephone Encounter (Signed)
 Per Niels reinhold Gaba, Rejection Reason - Other - Being that this patient has seen another orthopeadic provider for this issue, our providers require those records to be in office prior to scheduling. Patient is aware and is working on getting the records sent to our office. Referral is being closed due to time sensitivity but pt has our info to call and schedule. We will still be happy to assist your office and the patient. Thank you!

## 2023-11-04 DIAGNOSIS — S31829A Unspecified open wound of left buttock, initial encounter: Secondary | ICD-10-CM | POA: Diagnosis not present

## 2023-11-04 DIAGNOSIS — Z933 Colostomy status: Secondary | ICD-10-CM | POA: Diagnosis not present

## 2023-11-04 DIAGNOSIS — S31101A Unspecified open wound of abdominal wall, left upper quadrant without penetration into peritoneal cavity, initial encounter: Secondary | ICD-10-CM | POA: Diagnosis not present

## 2023-11-04 DIAGNOSIS — Z433 Encounter for attention to colostomy: Secondary | ICD-10-CM | POA: Diagnosis not present

## 2023-11-12 DIAGNOSIS — Z9889 Other specified postprocedural states: Secondary | ICD-10-CM | POA: Diagnosis not present

## 2023-11-12 DIAGNOSIS — J449 Chronic obstructive pulmonary disease, unspecified: Secondary | ICD-10-CM | POA: Diagnosis not present

## 2023-11-12 DIAGNOSIS — T8131XA Disruption of external operation (surgical) wound, not elsewhere classified, initial encounter: Secondary | ICD-10-CM | POA: Diagnosis not present

## 2023-11-12 DIAGNOSIS — T8130XA Disruption of wound, unspecified, initial encounter: Secondary | ICD-10-CM | POA: Diagnosis not present

## 2023-11-12 DIAGNOSIS — Z933 Colostomy status: Secondary | ICD-10-CM | POA: Diagnosis not present

## 2023-11-12 DIAGNOSIS — E119 Type 2 diabetes mellitus without complications: Secondary | ICD-10-CM | POA: Diagnosis not present

## 2023-11-12 DIAGNOSIS — I10 Essential (primary) hypertension: Secondary | ICD-10-CM | POA: Diagnosis not present

## 2023-11-12 DIAGNOSIS — Z79899 Other long term (current) drug therapy: Secondary | ICD-10-CM | POA: Diagnosis not present

## 2023-11-13 ENCOUNTER — Ambulatory Visit: Payer: Medicare HMO | Admitting: Nurse Practitioner

## 2023-11-13 DIAGNOSIS — C541 Malignant neoplasm of endometrium: Secondary | ICD-10-CM | POA: Diagnosis not present

## 2023-11-13 DIAGNOSIS — C20 Malignant neoplasm of rectum: Secondary | ICD-10-CM | POA: Diagnosis not present

## 2023-11-13 DIAGNOSIS — S31000D Unspecified open wound of lower back and pelvis without penetration into retroperitoneum, subsequent encounter: Secondary | ICD-10-CM | POA: Diagnosis not present

## 2023-11-14 DIAGNOSIS — C20 Malignant neoplasm of rectum: Secondary | ICD-10-CM | POA: Diagnosis not present

## 2023-11-14 DIAGNOSIS — R590 Localized enlarged lymph nodes: Secondary | ICD-10-CM | POA: Diagnosis not present

## 2023-11-14 DIAGNOSIS — S31000D Unspecified open wound of lower back and pelvis without penetration into retroperitoneum, subsequent encounter: Secondary | ICD-10-CM | POA: Diagnosis not present

## 2023-11-14 DIAGNOSIS — C541 Malignant neoplasm of endometrium: Secondary | ICD-10-CM | POA: Diagnosis not present

## 2023-11-14 DIAGNOSIS — C772 Secondary and unspecified malignant neoplasm of intra-abdominal lymph nodes: Secondary | ICD-10-CM | POA: Diagnosis not present

## 2023-11-19 DIAGNOSIS — Z5329 Procedure and treatment not carried out because of patient's decision for other reasons: Secondary | ICD-10-CM | POA: Diagnosis not present

## 2023-11-19 DIAGNOSIS — R918 Other nonspecific abnormal finding of lung field: Secondary | ICD-10-CM | POA: Diagnosis not present

## 2023-11-19 DIAGNOSIS — I10 Essential (primary) hypertension: Secondary | ICD-10-CM | POA: Diagnosis not present

## 2023-11-19 DIAGNOSIS — M4854XA Collapsed vertebra, not elsewhere classified, thoracic region, initial encounter for fracture: Secondary | ICD-10-CM | POA: Diagnosis not present

## 2023-11-19 DIAGNOSIS — E119 Type 2 diabetes mellitus without complications: Secondary | ICD-10-CM | POA: Diagnosis not present

## 2023-11-19 DIAGNOSIS — Z933 Colostomy status: Secondary | ICD-10-CM | POA: Diagnosis not present

## 2023-11-19 DIAGNOSIS — R32 Unspecified urinary incontinence: Secondary | ICD-10-CM | POA: Diagnosis not present

## 2023-11-19 DIAGNOSIS — Z9049 Acquired absence of other specified parts of digestive tract: Secondary | ICD-10-CM | POA: Diagnosis not present

## 2023-11-19 DIAGNOSIS — Z85048 Personal history of other malignant neoplasm of rectum, rectosigmoid junction, and anus: Secondary | ICD-10-CM | POA: Diagnosis not present

## 2023-11-19 DIAGNOSIS — L89159 Pressure ulcer of sacral region, unspecified stage: Secondary | ICD-10-CM | POA: Diagnosis not present

## 2023-11-20 ENCOUNTER — Encounter: Payer: Self-pay | Admitting: Nurse Practitioner

## 2023-11-20 DIAGNOSIS — R918 Other nonspecific abnormal finding of lung field: Secondary | ICD-10-CM | POA: Diagnosis not present

## 2023-11-20 NOTE — Telephone Encounter (Signed)
 Spoke with pt daughter she already went to ED and they did ct scan she like to discuss with alyssa gave toni to make appt with [facility] may need further test  and imaging

## 2023-11-21 ENCOUNTER — Telehealth: Payer: Self-pay

## 2023-11-21 DIAGNOSIS — G8929 Other chronic pain: Secondary | ICD-10-CM

## 2023-11-22 MED ORDER — HYDROCODONE-ACETAMINOPHEN 5-325 MG PO TABS: 1.0000 | ORAL_TABLET | Freq: Two times a day (BID) | ORAL | 0 refills | Status: DC | PRN

## 2023-11-25 NOTE — Telephone Encounter (Signed)
 Done

## 2023-11-26 ENCOUNTER — Ambulatory Visit: Admitting: Internal Medicine

## 2023-11-28 ENCOUNTER — Other Ambulatory Visit: Payer: Self-pay | Admitting: Nurse Practitioner

## 2023-11-28 DIAGNOSIS — Z8542 Personal history of malignant neoplasm of other parts of uterus: Secondary | ICD-10-CM | POA: Diagnosis not present

## 2023-11-28 DIAGNOSIS — Z85048 Personal history of other malignant neoplasm of rectum, rectosigmoid junction, and anus: Secondary | ICD-10-CM | POA: Diagnosis not present

## 2023-11-28 DIAGNOSIS — R591 Generalized enlarged lymph nodes: Secondary | ICD-10-CM | POA: Diagnosis not present

## 2023-11-28 DIAGNOSIS — Z66 Do not resuscitate: Secondary | ICD-10-CM | POA: Diagnosis not present

## 2023-11-28 DIAGNOSIS — K435 Parastomal hernia without obstruction or  gangrene: Secondary | ICD-10-CM | POA: Diagnosis not present

## 2023-11-28 DIAGNOSIS — Z933 Colostomy status: Secondary | ICD-10-CM | POA: Diagnosis not present

## 2023-11-28 DIAGNOSIS — E782 Mixed hyperlipidemia: Secondary | ICD-10-CM

## 2023-11-28 DIAGNOSIS — R933 Abnormal findings on diagnostic imaging of other parts of digestive tract: Secondary | ICD-10-CM | POA: Diagnosis not present

## 2023-12-02 DIAGNOSIS — R200 Anesthesia of skin: Secondary | ICD-10-CM | POA: Diagnosis not present

## 2023-12-02 DIAGNOSIS — L98429 Non-pressure chronic ulcer of back with unspecified severity: Secondary | ICD-10-CM | POA: Diagnosis not present

## 2023-12-02 DIAGNOSIS — N183 Chronic kidney disease, stage 3 unspecified: Secondary | ICD-10-CM | POA: Diagnosis not present

## 2023-12-02 DIAGNOSIS — E1142 Type 2 diabetes mellitus with diabetic polyneuropathy: Secondary | ICD-10-CM | POA: Diagnosis not present

## 2023-12-02 DIAGNOSIS — C20 Malignant neoplasm of rectum: Secondary | ICD-10-CM | POA: Diagnosis not present

## 2023-12-02 DIAGNOSIS — Z9221 Personal history of antineoplastic chemotherapy: Secondary | ICD-10-CM | POA: Diagnosis not present

## 2023-12-02 DIAGNOSIS — E1122 Type 2 diabetes mellitus with diabetic chronic kidney disease: Secondary | ICD-10-CM | POA: Diagnosis not present

## 2023-12-02 DIAGNOSIS — C541 Malignant neoplasm of endometrium: Secondary | ICD-10-CM | POA: Diagnosis not present

## 2023-12-02 DIAGNOSIS — R599 Enlarged lymph nodes, unspecified: Secondary | ICD-10-CM | POA: Diagnosis not present

## 2023-12-02 DIAGNOSIS — L89159 Pressure ulcer of sacral region, unspecified stage: Secondary | ICD-10-CM | POA: Diagnosis not present

## 2023-12-02 DIAGNOSIS — J449 Chronic obstructive pulmonary disease, unspecified: Secondary | ICD-10-CM | POA: Diagnosis not present

## 2023-12-06 DIAGNOSIS — Z1379 Encounter for other screening for genetic and chromosomal anomalies: Secondary | ICD-10-CM | POA: Diagnosis not present

## 2023-12-06 DIAGNOSIS — R590 Localized enlarged lymph nodes: Secondary | ICD-10-CM | POA: Diagnosis not present

## 2023-12-06 DIAGNOSIS — Z85048 Personal history of other malignant neoplasm of rectum, rectosigmoid junction, and anus: Secondary | ICD-10-CM | POA: Diagnosis not present

## 2023-12-06 DIAGNOSIS — C8294 Follicular lymphoma, unspecified, lymph nodes of axilla and upper limb: Secondary | ICD-10-CM | POA: Diagnosis not present

## 2023-12-06 DIAGNOSIS — Z8542 Personal history of malignant neoplasm of other parts of uterus: Secondary | ICD-10-CM | POA: Diagnosis not present

## 2023-12-06 DIAGNOSIS — C8290 Follicular lymphoma, unspecified, unspecified site: Secondary | ICD-10-CM | POA: Diagnosis not present

## 2023-12-09 ENCOUNTER — Encounter: Payer: Self-pay | Admitting: Nurse Practitioner

## 2023-12-09 ENCOUNTER — Ambulatory Visit (INDEPENDENT_AMBULATORY_CARE_PROVIDER_SITE_OTHER): Admitting: Nurse Practitioner

## 2023-12-09 VITALS — BP 116/60 | HR 87 | Temp 98.3°F | Resp 16 | Ht 60.0 in | Wt 243.0 lb

## 2023-12-09 DIAGNOSIS — F321 Major depressive disorder, single episode, moderate: Secondary | ICD-10-CM

## 2023-12-09 DIAGNOSIS — E662 Morbid (severe) obesity with alveolar hypoventilation: Secondary | ICD-10-CM | POA: Diagnosis not present

## 2023-12-09 DIAGNOSIS — Z933 Colostomy status: Secondary | ICD-10-CM

## 2023-12-09 DIAGNOSIS — E66813 Obesity, class 3: Secondary | ICD-10-CM

## 2023-12-09 DIAGNOSIS — Z6841 Body Mass Index (BMI) 40.0 and over, adult: Secondary | ICD-10-CM

## 2023-12-09 DIAGNOSIS — M5442 Lumbago with sciatica, left side: Secondary | ICD-10-CM | POA: Diagnosis not present

## 2023-12-09 DIAGNOSIS — Z0000 Encounter for general adult medical examination without abnormal findings: Secondary | ICD-10-CM

## 2023-12-09 DIAGNOSIS — Z79899 Other long term (current) drug therapy: Secondary | ICD-10-CM

## 2023-12-09 DIAGNOSIS — M5441 Lumbago with sciatica, right side: Secondary | ICD-10-CM | POA: Diagnosis not present

## 2023-12-09 DIAGNOSIS — G8929 Other chronic pain: Secondary | ICD-10-CM

## 2023-12-09 DIAGNOSIS — T148XXA Other injury of unspecified body region, initial encounter: Secondary | ICD-10-CM

## 2023-12-09 DIAGNOSIS — S31809D Unspecified open wound of unspecified buttock, subsequent encounter: Secondary | ICD-10-CM

## 2023-12-09 MED ORDER — HYDROCODONE-ACETAMINOPHEN 5-325 MG PO TABS: 1.0000 | ORAL_TABLET | Freq: Two times a day (BID) | ORAL | 0 refills | Status: DC | PRN

## 2023-12-09 MED ORDER — DULAGLUTIDE 3 MG/0.5ML ~~LOC~~ SOAJ: 3.0000 mg | SUBCUTANEOUS | 5 refills | Status: AC

## 2023-12-09 NOTE — Progress Notes (Signed)
 [facility] [address] [place], [place] [address]  Internal MEDICINE  Office Visit Note  Patient Name: Diana Bernard  [id number]  [id number]  Date of Service: 12/09/2023  Chief Complaint  Patient presents with   Depression   Gastroesophageal Reflux   Hypertension   Medicare Wellness    HPI Diana Bernard presents for a medicare annual wellness visit.  Well-appearing 78 y.o. female with hypertension, aortic atherosclerosis, heart failure, PAD, OSA, diverticulosis, chronic low back pain, osteoporosis, hypothyroidism, depression, and history of uterine cancer and colon cancer. Patient is being evaluated for metastatic cancer by her oncologist.  Routine CRC screening: followed by GI and oncologist  Routine mammogram: followed by oncology  DEXA scan: done in 2021 Labs: patient is having al ot of labs drawn by oncology right now. New or worsening pain: chronic pains in multiple joints Other concerns: Patient is having full urinary incontinence and some fecal incontinence -- needs to wear  disposable briefs.       12/09/2023    3:10 PM 11/12/2022    2:56 PM 11/06/2021    3:08 PM  MMSE - Mini Mental State Exam  Orientation to time 0 5 5  Orientation to Place 5 5 5   Registration 3 3 3   Attention/ Calculation 5 0 5  Recall 3 3 3   Language- name 2 objects 2 2 2   Language- repeat 1 1 1   Language- follow 3 step command 3 3 3   Language- read & follow direction 1 1 1   Write a sentence 0 0 1  Copy design 1 1 1   Total score 24 24 30     Functional Status Survey: Is the patient deaf or have difficulty hearing?: Yes Does the patient have difficulty seeing, even when wearing glasses/contacts?: Yes Does the patient have difficulty concentrating, remembering, or making decisions?: Yes Does the patient have difficulty walking or climbing stairs?: Yes Does the patient have difficulty dressing or bathing?: No Does the patient have difficulty doing errands alone such as visiting  a doctor's office or shopping?: No     08/24/2022   10:08 AM 11/12/2022    2:54 PM 04/09/2023   11:24 AM 08/05/2023    1:59 PM 12/09/2023    3:09 PM  Fall Risk  Falls in the past year? 0 0 0 0 0  Was there an injury with Fall?  0   0  Fall Risk Category Calculator  0   0  Patient at Risk for Falls Due to  No Fall Risks   No Fall Risks  Fall risk Follow up  Falls evaluation completed   Falls evaluation completed       11/12/2022    2:54 PM  Depression screen PHQ 2/9  Decreased Interest 0  Down, Depressed, Hopeless 0  PHQ - 2 Score 0       Current Medication: Outpatient Encounter Medications as of 12/09/2023  Medication Sig   acetaminophen  (TYLENOL ) 500 MG tablet Take 500 mg by mouth every 8 (eight) hours. Take 2 tablets by mouth every 8 hours.   albuterol  (VENTOLIN  HFA) 108 (90 Base) MCG/ACT inhaler Inhale 2 puffs into the lungs every 6 (six) hours as needed for wheezing or shortness of breath.   alendronate  (FOSAMAX ) 70 MG tablet TAKE 1 TABLET (70 MG TOTAL) BY MOUTH EVERY 7 (SEVEN) DAYS. TAKE WITH A FULL GLASS OF WATER ON AN EMPTY STOMACH.   allopurinol  (ZYLOPRIM ) 100 MG tablet TAKE 1 TABLET EVERY DAY   cholecalciferol (VITAMIN D ) 1000  units tablet Take 1,000 Units by mouth daily.   clotrimazole (LOTRIMIN) 1 % cream Apply 1 Application topically 2 (two) times daily. Apply topically 2x daily.   colchicine  0.6 MG tablet TAKE 1 TABLET EVERY DAY AS NEEDED FOR GOUT FLARE   Cyanocobalamin  (VITAMIN B-12 PO) Take by mouth. Pt takes [place] gummies   diclofenac Sodium (VOLTAREN) 1 % GEL Apply topically 4 (four) times daily.   diphenoxylate -atropine  (LOMOTIL ) 2.5-0.025 MG tablet Take 1 tablet by mouth 4 (four) times daily as needed for diarrhea or loose stools. Alternate with immodium   Dulaglutide  3 MG/0.5ML SOAJ Inject 3 mg into the skin once a week.   esomeprazole (NEXIUM) 20 MG capsule Take 20 mg by mouth daily at 12 noon.   fluticasone -salmeterol (ADVAIR) 500-50 MCG/ACT AEPB  INHALE 1 PUFF INTO THE LUNGS IN THE MORNING AND AT BEDTIME.   furosemide  (LASIX ) 20 MG tablet TAKE 1 TABLET EVERY DAY   gabapentin  (NEURONTIN ) 300 MG capsule TAKE 1 CAPSULE BY MOUTH IN MORNING, 1 CAPSULE AT MIDDAY AND 3 CAPSULES AT BEDTIME   ipratropium-albuterol  (DUONEB) 0.5-2.5 (3) MG/3ML SOLN Take 3 mLs by nebulization every 4 (four) hours as needed.   lidocaine  4 % Place 1 patch onto the skin daily.   lisinopril  (ZESTRIL ) 40 MG tablet Take 40 mg by mouth daily.   loratadine  (CLARITIN ) 10 MG tablet Take 1 tablet (10 mg total) by mouth daily.   magnesium oxide (MAG-OX) 400 (240 Mg) MG tablet Take 400 mg by mouth 2 (two) times daily.   meloxicam  (MOBIC ) 15 MG tablet Take 1 tablet (15 mg total) by mouth daily. In am with breakfast. Do not take with ibuprofen    metoprolol  succinate (TOPROL -XL) 50 MG 24 hr tablet TAKE 1 TABLET EVERY DAY WITH OR IMMEDIATELY FOLLOWING A MEAL   montelukast  (SINGULAIR ) 10 MG tablet Take 1 tablet (10 mg total) by mouth at bedtime.   Multiple Vitamin (MULTIVITAMIN) capsule Take 1 capsule by mouth daily.   nystatin  (MYCOSTATIN /NYSTOP ) powder Apply 1 Application topically daily as needed (stoma rash/irritation).   ondansetron  (ZOFRAN -ODT) 4 MG disintegrating tablet Take 1 tablet (4 mg total) by mouth every 8 (eight) hours as needed for nausea or vomiting.   potassium chloride  SA (KLOR-CON  M) 20 MEQ tablet TAKE 1 TABLET EVERY DAY   rosuvastatin  (CRESTOR ) 10 MG tablet TAKE 1 TABLET TWICE WEEKLY   venlafaxine  XR (EFFEXOR -XR) 37.5 MG 24 hr capsule Take 1 capsule (37.5 mg total) by mouth daily.   [DISCONTINUED] Dulaglutide  3 MG/0.5ML SOAJ Inject 3 mg into the skin once a week.   [DISCONTINUED] HYDROcodone -acetaminophen  (NORCO/VICODIN) 5-325 MG tablet Take 1 tablet by mouth 2 (two) times daily as needed for severe pain (pain score 7-10).   [DISCONTINUED] HYDROcodone -acetaminophen  (NORCO/VICODIN) 5-325 MG tablet Take 1 tablet by mouth 2 (two) times daily as needed for severe pain  (pain score 7-10).   No facility-administered encounter medications on file as of 12/09/2023.    Surgical History: Past Surgical History:  Procedure Laterality Date   ABCESS DRAINAGE  2016   Abdominal abcess due to diverticulitis   caridoverter defibrillator  11/14/2017   ICD   CATARACT EXTRACTION W/PHACO Left 10/26/2015   Procedure: CATARACT EXTRACTION PHACO AND INTRAOCULAR LENS PLACEMENT (IOC) LEFT EYE;  Surgeon: Dene Etienne, MD;  [facility];  Service: Ophthalmology;  Laterality: Left;   CATARACT EXTRACTION W/PHACO Right 12/07/2015   Procedure: CATARACT EXTRACTION PHACO AND INTRAOCULAR LENS PLACEMENT (IOC);  Surgeon: Dene Etienne, MD;  Location: [facility];  Service: Ophthalmology;  Laterality: Right;   CHOLECYSTECTOMY     FOOT SURGERY     Per patient, bilateral foot surgery.   OSTOMY  01/25/2021   OSTOMY  02/11/2021   PELVIC FRACTURE SURGERY     Per patient for cancer.    Medical History: Past Medical History:  Diagnosis Date   Arthritis    Asthma    Cancer (HCC)    Deep vein thrombosis (DVT) (HCC) 2015   Depression    GERD (gastroesophageal reflux disease)    Hypertension    Weakness of both legs     Family History: Family History  Problem Relation Age of Onset   Depression Sister    Heart disease Sister    Early death Sister    Depression Sister    Depression Sister    Depression Sister    Depression Sister     Social History   Socioeconomic History   Marital status: Married    Spouse name: Not on file   Number of children: Not on file   Years of education: Not on file   Highest education level: Not on file  Occupational History   Not on file  Tobacco Use   Smoking status: Never   Smokeless tobacco: Never  Vaping Use   Vaping status: Never Used  Substance and Sexual Activity   Alcohol use: No   Drug use: No   Sexual activity: Not Currently  Other Topics Concern   Not on file  Social History Narrative    Not on file   Social Drivers of Health   Financial Resource Strain: Low Risk  (11/15/2022)   Received from [facility]   Overall Financial Resource Strain (CARDIA)    Difficulty of Paying Living Expenses: Not very hard  Food Insecurity: No Food Insecurity (12/02/2023)   Received from [facility]   Hunger Vital Sign    Within the past 12 months, you worried that your food would run out before you got the money to buy more.: Never true    Within the past 12 months, the food you bought just didn't last and you didn't have money to get more.: Never true  Transportation Needs: No Transportation Needs (12/02/2023)   Received from [facility] - Transportation    Lack of Transportation (Medical): No    Lack of Transportation (Non-Medical): No  Physical Activity: Not on file  Stress: Not on file  Social Connections: Not on file  Intimate Partner Violence: Not At Risk (09/02/2023)   Received from [facility]   Humiliation, Afraid, Rape, and Kick questionnaire    Within the last year, have you been afraid of your partner or ex-partner?: No    Within the last year, have you been humiliated or emotionally abused in other ways by your partner or ex-partner?: No    Within the last year, have you been kicked, hit, slapped, or otherwise physically hurt by your partner or ex-partner?: No    Within the last year, have you been raped or forced to have any kind of sexual activity by your partner or ex-partner?: No      Review of Systems  Constitutional:  Negative for activity change, appetite change, chills, fatigue, fever and unexpected weight change.  HENT: Negative.  Negative for congestion, ear pain, rhinorrhea, sore throat and trouble swallowing.   Eyes: Negative.   Respiratory: Negative.  Negative for cough, chest tightness, shortness of breath and wheezing.  Cardiovascular: Negative.  Negative for chest pain and palpitations.  Gastrointestinal: Negative.  Negative for  abdominal pain, blood in stool, constipation, diarrhea, nausea and vomiting.  Endocrine: Negative.   Genitourinary: Negative.  Negative for difficulty urinating, dysuria, frequency, hematuria and urgency.  Musculoskeletal:  Positive for gait problem (walking with a cane when Diana Bernard is not at home. uses her wheelchair at home due to being easily fatigued.). Negative for arthralgias, back pain, joint swelling, myalgias and neck pain.  Skin: Negative.  Negative for rash and wound.  Allergic/Immunologic: Negative.  Negative for immunocompromised state.  Neurological:  Negative for dizziness, seizures, numbness and headaches.  Hematological: Negative.   Psychiatric/Behavioral:  Negative for behavioral problems, self-injury and suicidal ideas. The patient is not nervous/anxious.     Vital Signs: BP 116/60   Pulse 87   Temp 98.3 F (36.8 C)   Resp 16   Ht 5' (1.524 m)   Wt 243 lb (110.2 kg)   SpO2 93%   BMI 47.46 kg/m    Physical Exam Vitals reviewed.  Constitutional:      General: Diana Bernard is awake. Diana Bernard is not in acute distress.    Appearance: Normal appearance. Diana Bernard is well-developed and well-groomed. Diana Bernard is obese. Diana Bernard is not ill-appearing.  HENT:     Head: Normocephalic and atraumatic.     Right Ear: Tympanic membrane, ear canal and external ear normal.     Left Ear: Tympanic membrane, ear canal and external ear normal.     Nose: Nose normal.     Mouth/Throat:     Mouth: Mucous membranes are moist.     Pharynx: Oropharynx is clear. No posterior oropharyngeal erythema.  Eyes:     Extraocular Movements: Extraocular movements intact.     Pupils: Pupils are equal, round, and reactive to light.  Cardiovascular:     Rate and Rhythm: Normal rate and regular rhythm.     Pulses: Normal pulses.     Heart sounds: Normal heart sounds.  Pulmonary:     Effort: Pulmonary effort is normal.     Breath sounds: Normal breath sounds.  Abdominal:     General: The ostomy site is clean. Bowel sounds are  normal. There is no distension.     Palpations: Abdomen is soft.     Tenderness: There is no abdominal tenderness.     Hernia: No hernia is present.     Comments: Colostomy device in place, clean dry and intact, area of stoma that is visible through ostomy back is normal, pink, no erythema, swelling or irritation noted.   Musculoskeletal:     Right lower leg: No edema.     Left lower leg: No edema.  Lymphadenopathy:     Cervical: No cervical adenopathy.  Skin:    General: Skin is warm and dry.     Capillary Refill: Capillary refill takes less than 2 seconds.  Neurological:     General: No focal deficit present.     Mental Status: Diana Bernard is alert and oriented to person, place, and time.     Gait: Gait abnormal (walks with a cane, walker or uses wheelchair).  Psychiatric:        Mood and Affect: Mood normal.        Behavior: Behavior normal. Behavior is cooperative.        Assessment/Plan: 1. Encounter for subsequent annual wellness visit (AWV) in Medicare patient (Primary) Age-appropriate preventive screenings and vaccinations discussed. Routine labs for health maintenance deferred for now, patient has had  multiple labs drawn by oncology recently. PHM updated.    2. Chronic bilateral low back pain with bilateral sciatica Continue prn pain medication as prescribed.   3. Wound with tunneling Referred to wound clinic  - Ambulatory referral to Wound Clinic  4. Wound of buttock, unspecified laterality, subsequent encounter Referred to wound clinic  - Ambulatory referral to Wound Clinic  5. Class 3 obesity with alveolar hypoventilation, serious comorbidity, and body mass index (BMI) of 45.0 to 49.9 in adult [facility]) Continue trulicity  as prescribed.  - Dulaglutide  3 MG/0.5ML SOAJ; Inject 3 mg into the skin once a week.  Dispense: 2 mL; Refill: 5  6. Encounter for medication review Continue trulicity  as prescribed. - Dulaglutide  3 MG/0.5ML SOAJ; Inject 3 mg into the skin once a week.   Dispense: 2 mL; Refill: 5  7. S/P colostomy (HCC) Noted, no issues   8. Moderate major depression (HCC) Related to current health status and possible new cancer diagnosis     General Counseling: Toriana verbalizes understanding of the findings of todays visit and agrees with plan of treatment. I have discussed any further diagnostic evaluation that may be needed or ordered today. We also reviewed her medications today. Diana Bernard has been encouraged to call the office with any questions or concerns that should arise related to todays visit.    Orders Placed This Encounter  Procedures   Ambulatory referral to Wound Clinic    Meds ordered this encounter  Medications   Dulaglutide  3 MG/0.5ML SOAJ    Sig: Inject 3 mg into the skin once a week.    Dispense:  2 mL    Refill:  5    Dx code E11.65   DISCONTD: HYDROcodone -acetaminophen  (NORCO/VICODIN) 5-325 MG tablet    Sig: Take 1 tablet by mouth 2 (two) times daily as needed for severe pain (pain score 7-10).    Dispense:  60 tablet    Refill:  0    For future refills    Return for f/u in 3 to 4 months.   Total time spent:30 Minutes Time spent includes review of chart, medications, test results, and follow up plan with the patient.   [place] Controlled Substance Database was reviewed by me.  This patient was seen by Mardy Maxin, FNP-C in collaboration with Dr. Sigrid Bathe as a part of collaborative care agreement.  Harlon Kutner R. Maxin, MSN, FNP-C Internal medicine

## 2023-12-11 DIAGNOSIS — C541 Malignant neoplasm of endometrium: Secondary | ICD-10-CM | POA: Diagnosis not present

## 2023-12-11 DIAGNOSIS — C20 Malignant neoplasm of rectum: Secondary | ICD-10-CM | POA: Diagnosis not present

## 2023-12-11 DIAGNOSIS — R591 Generalized enlarged lymph nodes: Secondary | ICD-10-CM | POA: Diagnosis not present

## 2023-12-14 DIAGNOSIS — E860 Dehydration: Secondary | ICD-10-CM | POA: Diagnosis not present

## 2023-12-14 DIAGNOSIS — M47816 Spondylosis without myelopathy or radiculopathy, lumbar region: Secondary | ICD-10-CM | POA: Diagnosis not present

## 2023-12-14 DIAGNOSIS — K219 Gastro-esophageal reflux disease without esophagitis: Secondary | ICD-10-CM | POA: Diagnosis not present

## 2023-12-14 DIAGNOSIS — Z043 Encounter for examination and observation following other accident: Secondary | ICD-10-CM | POA: Diagnosis not present

## 2023-12-14 DIAGNOSIS — N189 Chronic kidney disease, unspecified: Secondary | ICD-10-CM | POA: Diagnosis not present

## 2023-12-14 DIAGNOSIS — J449 Chronic obstructive pulmonary disease, unspecified: Secondary | ICD-10-CM | POA: Diagnosis not present

## 2023-12-14 DIAGNOSIS — E119 Type 2 diabetes mellitus without complications: Secondary | ICD-10-CM | POA: Diagnosis not present

## 2023-12-14 DIAGNOSIS — I509 Heart failure, unspecified: Secondary | ICD-10-CM | POA: Diagnosis not present

## 2023-12-14 DIAGNOSIS — I130 Hypertensive heart and chronic kidney disease with heart failure and stage 1 through stage 4 chronic kidney disease, or unspecified chronic kidney disease: Secondary | ICD-10-CM | POA: Diagnosis not present

## 2023-12-14 DIAGNOSIS — Z8542 Personal history of malignant neoplasm of other parts of uterus: Secondary | ICD-10-CM | POA: Diagnosis not present

## 2023-12-14 DIAGNOSIS — Z66 Do not resuscitate: Secondary | ICD-10-CM | POA: Diagnosis not present

## 2023-12-14 DIAGNOSIS — L89159 Pressure ulcer of sacral region, unspecified stage: Secondary | ICD-10-CM | POA: Diagnosis not present

## 2023-12-14 DIAGNOSIS — R531 Weakness: Secondary | ICD-10-CM | POA: Diagnosis not present

## 2023-12-15 DIAGNOSIS — S31101A Unspecified open wound of abdominal wall, left upper quadrant without penetration into peritoneal cavity, initial encounter: Secondary | ICD-10-CM | POA: Diagnosis not present

## 2023-12-15 DIAGNOSIS — G9349 Other encephalopathy: Secondary | ICD-10-CM | POA: Diagnosis not present

## 2023-12-15 DIAGNOSIS — Z933 Colostomy status: Secondary | ICD-10-CM | POA: Diagnosis not present

## 2023-12-15 DIAGNOSIS — I130 Hypertensive heart and chronic kidney disease with heart failure and stage 1 through stage 4 chronic kidney disease, or unspecified chronic kidney disease: Secondary | ICD-10-CM | POA: Diagnosis not present

## 2023-12-15 DIAGNOSIS — R7881 Bacteremia: Secondary | ICD-10-CM | POA: Diagnosis not present

## 2023-12-15 DIAGNOSIS — N189 Chronic kidney disease, unspecified: Secondary | ICD-10-CM | POA: Diagnosis not present

## 2023-12-15 DIAGNOSIS — N179 Acute kidney failure, unspecified: Secondary | ICD-10-CM | POA: Diagnosis not present

## 2023-12-15 DIAGNOSIS — N1831 Chronic kidney disease, stage 3a: Secondary | ICD-10-CM | POA: Diagnosis not present

## 2023-12-15 DIAGNOSIS — I5032 Chronic diastolic (congestive) heart failure: Secondary | ICD-10-CM | POA: Diagnosis not present

## 2023-12-15 DIAGNOSIS — Z85040 Personal history of malignant carcinoid tumor of rectum: Secondary | ICD-10-CM | POA: Diagnosis not present

## 2023-12-15 DIAGNOSIS — N3000 Acute cystitis without hematuria: Secondary | ICD-10-CM | POA: Diagnosis not present

## 2023-12-15 DIAGNOSIS — Z79899 Other long term (current) drug therapy: Secondary | ICD-10-CM | POA: Diagnosis not present

## 2023-12-15 DIAGNOSIS — R0902 Hypoxemia: Secondary | ICD-10-CM | POA: Diagnosis not present

## 2023-12-15 DIAGNOSIS — Z433 Encounter for attention to colostomy: Secondary | ICD-10-CM | POA: Diagnosis not present

## 2023-12-15 DIAGNOSIS — I5022 Chronic systolic (congestive) heart failure: Secondary | ICD-10-CM | POA: Diagnosis not present

## 2023-12-15 DIAGNOSIS — J449 Chronic obstructive pulmonary disease, unspecified: Secondary | ICD-10-CM | POA: Diagnosis not present

## 2023-12-15 DIAGNOSIS — I509 Heart failure, unspecified: Secondary | ICD-10-CM | POA: Diagnosis not present

## 2023-12-15 DIAGNOSIS — R531 Weakness: Secondary | ICD-10-CM | POA: Diagnosis not present

## 2023-12-15 DIAGNOSIS — R7989 Other specified abnormal findings of blood chemistry: Secondary | ICD-10-CM | POA: Diagnosis not present

## 2023-12-15 DIAGNOSIS — A419 Sepsis, unspecified organism: Secondary | ICD-10-CM | POA: Diagnosis not present

## 2023-12-15 DIAGNOSIS — R0609 Other forms of dyspnea: Secondary | ICD-10-CM | POA: Diagnosis not present

## 2023-12-15 DIAGNOSIS — R410 Disorientation, unspecified: Secondary | ICD-10-CM | POA: Diagnosis not present

## 2023-12-15 DIAGNOSIS — N390 Urinary tract infection, site not specified: Secondary | ICD-10-CM | POA: Diagnosis not present

## 2023-12-15 DIAGNOSIS — G9341 Metabolic encephalopathy: Secondary | ICD-10-CM | POA: Diagnosis not present

## 2023-12-15 DIAGNOSIS — J441 Chronic obstructive pulmonary disease with (acute) exacerbation: Secondary | ICD-10-CM | POA: Diagnosis not present

## 2023-12-15 DIAGNOSIS — R110 Nausea: Secondary | ICD-10-CM | POA: Diagnosis not present

## 2023-12-15 DIAGNOSIS — L89153 Pressure ulcer of sacral region, stage 3: Secondary | ICD-10-CM | POA: Diagnosis not present

## 2023-12-15 DIAGNOSIS — Z6841 Body Mass Index (BMI) 40.0 and over, adult: Secondary | ICD-10-CM | POA: Diagnosis not present

## 2023-12-15 DIAGNOSIS — F05 Delirium due to known physiological condition: Secondary | ICD-10-CM | POA: Diagnosis not present

## 2023-12-15 DIAGNOSIS — I10 Essential (primary) hypertension: Secondary | ICD-10-CM | POA: Diagnosis not present

## 2023-12-15 DIAGNOSIS — Z9981 Dependence on supplemental oxygen: Secondary | ICD-10-CM | POA: Diagnosis not present

## 2023-12-15 DIAGNOSIS — S31829A Unspecified open wound of left buttock, initial encounter: Secondary | ICD-10-CM | POA: Diagnosis not present

## 2023-12-20 NOTE — Discharge Summary (Addendum)
 Physician Discharge Summary [facility] 7 GENERAL MEDICINE UNIT [facility] [address] [place] [address] Dept: [phone] Loc: [phone]   Identifying Information:  Diana Bernard 1946-04-13 [id number]  Primary Care Physician: Fernand Sigrid Hurl, MD  Code Status: DNR and DNI  Admit Date: 12/15/2023  Discharge Date: 12/20/2023   Discharge To: Home  Discharge Service: [facility] - Hospitalist [place]   Discharge Attending Physician: Bernett Ada, MD  Discharge Diagnoses: Principal Problem:   COPD exacerbation     (POA: Yes) Active Problems:   Hypertension (POA: Yes)   Urinary tract infection (POA: Yes)   History of MDR Pseudomonas aeruginosa infection (POA: Yes)   Anemia (POA: Yes)   Thrombocytopenia (POA: Yes)   Elevated serum creatinine (POA: Yes) Resolved Problems:   * No resolved hospital problems. *   Outpatient Provider Follow Up Issues:  1) [facility]  Hospital Course:  Micca Matura is a 78 year old female with a history of COPD, CHF (EF 50-55%), OSA, morbid obesity, rectal cancer s/p colostomy, remote endometrial cancer, and chronic kidney disease, who was admitted for weakness and shortness of breath and found to have a COPD exacerbation and urinary tract infection.  COPD Exacerbation and Hypoxemia  She presented with several days of dyspnea, wheezing, non-productive cough, low-grade fever, and weakness, and was found to be hypoxic to the high 80s by EMS. Hypoxia improved with 2L oxygen  in the ED, and she was transitioned to 2L  during hospitalization. She declined steroids due to adverse effects and was transitioned from Advair to nebulized budesonide  and arformoterol BID, with continued duonebs and albuterol  PRN. Oxygen  was weaned as tolerated, and she completed a course of azithromycin . Her respiratory status improved, and she was saturating in the high 90s on room air at discharge.  Urinary Tract Infection and Bacteremia  She was  found to have dysuria, dark urine, and confusion prior to ED presentation. UA was positive for leukocyte esterase and nitrite, and urine cultures grew Proteus vulgaris and Enterococcus faecalis. She has a history of MDR Pseudomonas aeruginosa UTI and was on nitrofurantoin  prophylaxis prior to admission, which was held during treatment. She was initially treated with cefepime, then transitioned to meropenem  due to prior sensitivities. One of two blood cultures was positive for Staphylococcus epidermidis, considered a contaminant, and vancomycin was discontinued. Repeat blood cultures showed no growth at 48 hours and at 4 days. Her mental status improved with treatment.  Delirium  She experienced waxing and waning hypoactive delirium, likely secondary to acute illness and UTI, with periods of lethargy and confusion. Delirium resolved with treatment of infection and supportive care, and she was alert and oriented at discharge.  Pressure Injury (Coccyx/Sacrum)  A stage 3 pressure injury over the coccyx was present on admission, with moderate serosanguinous drainage and tenderness. WOCN recommended daily dressing changes with hydrofiber silver and silicone foam, turning, and offloading. The wound was managed with frequent repositioning, pressure reduction, and skin protection interventions. No new pressure injuries developed during hospitalization.  Generalized Weakness, Mobility, and Functional Status  She had significant weakness and decreased endurance, requiring moderate to maximal assistance for ADLs and transfers. PT and OT recommended ongoing therapy 3x weekly post-discharge. She was able to ambulate short distances with a rolling walker and required assistance for bathing, toileting, and lower body dressing. Family support was present, and home health services were arranged for continued rehabilitation and wound care.  Hypertension and Heart Failure  Home lisinopril  and furosemide  were held due to  acute  kidney injury and hypotension; metoprolol  was continued except when MAP was below 70. She remained hemodynamically stable during hospitalization.  Anemia and Thrombocytopenia  She had chronic anemia and thrombocytopenia, with hemoglobin and platelet counts below normal throughout admission. No acute interventions were required for these findings.  Procedures: None No admission procedures for hospital encounter. ______________________________________________________________________ Discharge Medications:   Your Medication List     START taking these medications    polyethylene glycol 17 gram/dose powder Commonly known as: GLYCOLAX Mix 1 capful (17 g) in liquid as directed and drink. Do this two (2) times a day.   SENNA 8.6 mg tablet Generic drug: senna Take 1 tablet by mouth two (2) times a day.       CHANGE how you take these medications    acetaminophen  500 MG tablet Commonly known as: TYLENOL  Take 2 tablets (1,000 mg total) by mouth every eight (8) hours. What changed: when to take this   alendronate  70 MG tablet Commonly known as: FOSAMAX  Take 1 tablet (70 mg total) by mouth every seven (7) days. [facility] What changed: additional instructions   furosemide  20 MG tablet Commonly known as: LASIX  Take 1 tablet (20 mg total) by mouth once daily as needed for edema/hypervolemia. What changed: additional instructions       CONTINUE taking these medications    albuterol  90 mcg/actuation inhaler Commonly known as: PROVENTIL  HFA;VENTOLIN  HFA Inhale 2 puffs every four (4) hours as needed.   allopurinol  100 MG tablet Commonly known as: ZYLOPRIM  Take 1 tablet (100 mg total) by mouth daily.   cholecalciferol (vitamin D3-50 mcg (2,000 unit)) 50 mcg (2,000 unit) tablet Take 1 tablet (50 mcg total) by mouth before bedtime.   colchicine  0.6 mg tablet Commonly known as: COLCRYS  Take 1 tablet (0.6 mg total) by mouth daily as needed.   cyanocobalamin  500 MCG  tablet Take 1 tablet (500 mcg total) by mouth daily.   diclofenac sodium 1 % gel Commonly known as: VOLTAREN APPLY 2 G TOPICALLY FOUR (4) TIMES A DAY.   esomeprazole 20 MG capsule Commonly known as: NEXIUM Take 1 capsule (20 mg total) by mouth daily before breakfast.   fluticasone  propion-salmeterol 500-50 mcg/dose diskus Commonly known as: ADVAIR, WIXELA Inhale 1 puff.   gabapentin  300 MG capsule Commonly known as: NEURONTIN  3 tablets at night (900mg ) and 1 tablet in the morning (300mg ) and 1 tablet at midday (300mg )   HYDROcodone -acetaminophen  5-325 mg per tablet Commonly known as: NORCO Take 1 tablet by mouth.   ipratropium-albuterol  0.5-2.5 mg/3 mL nebulizer Commonly known as: DUO-NEB Inhale 3 mL every six (6) hours as needed.   KLAYESTA  100,000 unit/gram powder Generic drug: nystatin  Apply 1 Application topically two (2) times a day.   lidocaine  4 % patch Commonly known as: ASPERCREME Place 1 patch on the skin daily.   lisinopril  40 MG tablet Commonly known as: PRINIVIL ,ZESTRIL  Take 1 tablet (40 mg total) by mouth every morning.   meloxicam  15 MG tablet Commonly known as: MOBIC  Take 1 tablet (15 mg total) by mouth daily.   metoPROLOL  succinate 50 MG 24 hr tablet Commonly known as: Toprol -XL Take 1 tablet (50 mg total) by mouth daily.   montelukast  10 mg tablet Commonly known as: SINGULAIR  Take 1 tablet (10 mg total) by mouth nightly.   multivitamins, therapeutic with minerals 9 mg iron-400 mcg tablet Take 1 tablet by mouth daily.   potassium chloride  20 MEQ ER tablet Take 1 tablet (20 mEq total) by mouth daily.   rosuvastatin   10 MG tablet Commonly known as: CRESTOR  Take 1 tablet (10 mg total) by mouth 2 times a week (Tuesday and Saturday). Start taking on: December 21, 2023   TRULICITY  3 mg/0.5 mL injection pen Generic drug: dulaglutide  INJECT 3 MG INTO THE SKIN ONCE A WEEK   venlafaxine  37.5 MG 24 hr capsule Commonly known as: EFFEXOR -XR Take 1  capsule (37.5 mg total) by mouth daily.        Allergies: Grass pollen-june grass standard ______________________________________________________________________ Pending Test Results (if blank, then none): Pending Labs     Order Current Status   Respiratory Pathogen Panel with COVID (Nasopharyngeal) Collected (12/15/23 2051)   Blood Culture Preliminary result   Blood Culture #1 Preliminary result   Blood Culture #2 Preliminary result       Most Recent Labs: All lab results last 24 hours - No results found for this or any previous visit (from the past 24 hours).  Relevant Studies/Radiology (if blank, then none): ECG 12 Lead Result Date: 12/17/2023 ATRIAL-SENSED VENTRICULAR-PACED RHYTHM ABNORMAL ECG WHEN COMPARED WITH ECG OF 14-Dec-2023 19:17, NO SIGNIFICANT CHANGE WAS FOUND Confirmed by Leni Mings [phone]) on 12/17/2023 8:59:59 PM  XR Chest Portable Result Date: 12/15/2023 EXAM: XR CHEST PORTABLE ACCESSION: [id number] UN REPORT DATE: 12/15/2023 6:47 PM CLINICAL INDICATION: sepsis ; OTHER  TECHNIQUE: Single View AP Chest Radiograph. COMPARISON: Chest radiograph 12/14/2023 FINDINGS: An automatic implantable cardioverter defibrillator is in place-unchanged. Lungs are clear.  No pleural effusion or pneumothorax. Unchanged cardiomediastinal silhouette. There are calcifications of the aortic knob. Osseous structures are unchanged   No adverse interval change. No evidence of an acute cardiopulmonary process.   ______________________________________________________________________ Discharge Instructions:      Other Instructions     Call MD for:     Contact you health provider immediately or go to the emergency room if your breathing becomes more difficult.   Call MD for:  temperature >38.5 Celsius     Discharge instructions     You were admitted to the hospital because you had a flare of your COPD as well as a urinary tract infection. You were administered nebulizer treatments for  your COPD and were given a course of intravenous antibiotics for your urinary tract infection. Your breathing is much improved and you are now stable for discharge home. During your admission you were experience constipation. We have written you a prescription for Miralax and senna to help with the constipation. Please take these medication twice a day for 2 days and then only as needed for constipation.       Follow Up instructions and Outpatient Referrals    Ambulatory Referral to Home Health     Reason for referral: [facility]   Physician to follow patient's care: PCP   Disciplines requested:  Physical Therapy Occupational Therapy     Physical Therapy requested:  Evaluate and treat Strengthening exercises     Occupational Therapy Requested:  ADL or IADL training Evaluate and treat Transfer training Strengthening exercises     Special instructions: Within 48 hours after discharge   Ambulatory Referral to Home Health     Reason for referral: [facility] PT/OT, wound care   Physician to follow patient's care: PCP   Disciplines requested:  Nursing Physical Therapy Occupational Therapy     Nursing requested: Wound Care   Wound count: Wound 1   Wound 1 type/staging: Pressure/stage 3   Wound 1 location: Coccyx   Wound 1 care orders: Cleanse with Vashe, apply hydrofiber with silver cut  to  fit wound bed, cover with sacral silicone foam bordered dressing,  change daily and PRN soiled, saturated or dislodged   Wound 1 order frequency: Daily and PRN   Physical Therapy requested:  Strengthening exercises Evaluate and treat     Occupational Therapy Requested:  Strengthening exercises Evaluate and treat ADL or IADL training Transfer training     Special instructions: Within 48 hours after discharge   Call MD for:     Call MD for:  temperature >38.5 Celsius     Discharge instructions        ______________________________________________________________________ Discharge Day Services: BP  116/85   Pulse 110   Temp 37 C (98.6 F) (Oral)   Resp 17   Ht 152.4 cm (5')   Wt (!) 109.6 kg (241 lb 10 oz)   SpO2 97%   BMI 47.19 kg/m  Pt seen on the day of discharge and determined appropriate for discharge.  Condition at Discharge: fair  Length of Discharge: I spent greater than 30 mins in the discharge of this patient.

## 2023-12-23 ENCOUNTER — Telehealth: Payer: Self-pay

## 2023-12-23 NOTE — Transitions of Care (Post Inpatient/ED Visit) (Signed)
   12/23/2023  Name: Diana Bernard MRN: [id number] DOB: 12-20-1945  Today's TOC FU Call Status: Today's TOC FU Call Status:: Unsuccessful Call (1st Attempt) Unsuccessful Call (1st Attempt) Date: 12/23/23  Attempted to reach the patient regarding the most recent Inpatient/ED visit. RN CM left confidential voice mail with call back number after identifying self.   Follow Up Plan: Additional outreach attempts will be made to reach the patient to complete the Transitions of Care (Post Inpatient/ED visit) call.    Bing Edison MSN, RN RN Case Sales executive Health  [facility]Population [facility] Office Hours M-F [phone] Direct Dial: [phone] Main Phone [phone]  Fax: [phone] .com

## 2023-12-24 ENCOUNTER — Telehealth: Payer: Self-pay

## 2023-12-24 NOTE — Care Plan (Signed)
         Transitions Subsequent Calls   Subjective/Objective: Since our last conversation, are you feeling better, same, or worse?: Same Patient concerns addressed today: Patient reports that she is having some abdominal pain.  Advised patient to contact her provider or return to ED. Have there been any medication changes since our last conversation?: No Barriers to patient care?: Health Literacy Action Plan: Case Manager next follow-up date: 12/27/23 Intervention provided: Brief motivational interviewing, Supportive listening CC Note updated: Yes Encounter routed to: PCP Appointments reviewed: Yes     Johnston DELENA Lower, RN

## 2023-12-24 NOTE — Telephone Encounter (Signed)
 Leanne from [facility] called stating that home care will go out to see patient on the 29th due to staffing issues. [phone]

## 2023-12-24 NOTE — Transitions of Care (Post Inpatient/ED Visit) (Signed)
 Today's TOC FU Call Status: Today's TOC FU Call Status:: Successful TOC FU Call Completed TOC FU Call Complete Date: 12/24/23 Patient's Name and Date of Birth confirmed.  Transition Care Management Follow-up Telephone Call Date of Discharge: 12/20/23 Discharge Facility: Other [facility]) Name of Other (Non[facility]) Discharge Facility: [facility] Type of Discharge: Inpatient Admission Primary Inpatient Discharge Diagnosis:: COPD with exacerbation How have you been since you were released from the hospital?:  Worse (Daughter answered, declined full call 2/2 enrolled in [facility] program & just finished f/u call, but she did have a question about patient still having adominal pain. [facility] TOC RNCM advised call provider/ED. RN CM confirmed this w/ daughter on this call.) Any questions or concerns?:  Yes Patient Questions/Concerns::  Daughter answered, declined full call 2/2 enrolled in [facility] 30 Day [facility] program & just finished f/u call, but she did have a question about patient still having adominal pain. [facility] TOC RNCM advised call provider/ED.  RN CM confirmed this w/ daughter on this call. Patient Questions/Concerns Addressed: Other: (Advised daughter to contact provider or go to UC/ED for continuing abdominal pain reported for patient.)  Items Reviewed: Did you receive and understand the discharge instructions provided?: Yes Medications obtained,verified, and reconciled?: No Medications Not Reviewed Reasons:: Other: (Dauther declined most of call due to enrolling in [facility] program, but had question about ongoing abdominal pain she was reporting patient was having.) Dietary orders reviewed?: NA  Medications Reviewed Today: Daughter answered, declined full call 2/2 enrolled in [facility] 30 Day TOC  post discharge follow up program. Medications Reviewed Today   Medications were not reviewed in this encounter    8/26: [facility] RN CM post discharge outreach attempt #2.  Patient's Daughter answered,  declined full call 2/2 enrolled in [facility] 30-Day [facility] program & had just finished f/u call with that RN CM.  Daughter, Edna Kast,  did have a question/concern about patient reportedly still having significant abdominal pain.  Contact: Agnew,Joann (Daughter) [phone] (Home Phone). [facility] TOC RNCM had advised call provider/ED.  [facility] TOC RN CM confirmed this w/ daughter on this call. Daughter to contact provider indicated by [facility] CM.   12/24/23: Plan:  Patient Questions/Concerns Addressed: Other: (Advised daughter to contact provider or go to UC/ED for continuing abdominal pain reported for patient.)  No further follow up calls at this time: Daughter verbally declined [facility] TOC follow up calls/30 Day program due to already being enrolled in [facility] 30Day program and having two follow up calls to date, one was just completed prior to this [facility] RN CM outreach.   Bing Edison MSN, RN RN Case Sales executive Health  [facility]Population Health Office Hours M-F [phone] Direct Dial: [phone] Main Phone [phone]  Fax: [phone] [email or web address]

## 2023-12-25 DIAGNOSIS — N390 Urinary tract infection, site not specified: Secondary | ICD-10-CM | POA: Diagnosis not present

## 2023-12-25 DIAGNOSIS — J441 Chronic obstructive pulmonary disease with (acute) exacerbation: Secondary | ICD-10-CM | POA: Diagnosis not present

## 2023-12-25 DIAGNOSIS — L89152 Pressure ulcer of sacral region, stage 2: Secondary | ICD-10-CM | POA: Diagnosis not present

## 2023-12-25 DIAGNOSIS — N189 Chronic kidney disease, unspecified: Secondary | ICD-10-CM | POA: Diagnosis not present

## 2023-12-25 DIAGNOSIS — I130 Hypertensive heart and chronic kidney disease with heart failure and stage 1 through stage 4 chronic kidney disease, or unspecified chronic kidney disease: Secondary | ICD-10-CM | POA: Diagnosis not present

## 2023-12-25 DIAGNOSIS — I2510 Atherosclerotic heart disease of native coronary artery without angina pectoris: Secondary | ICD-10-CM | POA: Diagnosis not present

## 2023-12-25 DIAGNOSIS — E1122 Type 2 diabetes mellitus with diabetic chronic kidney disease: Secondary | ICD-10-CM | POA: Diagnosis not present

## 2023-12-25 DIAGNOSIS — D631 Anemia in chronic kidney disease: Secondary | ICD-10-CM | POA: Diagnosis not present

## 2023-12-25 DIAGNOSIS — I5020 Unspecified systolic (congestive) heart failure: Secondary | ICD-10-CM | POA: Diagnosis not present

## 2023-12-26 DIAGNOSIS — I2510 Atherosclerotic heart disease of native coronary artery without angina pectoris: Secondary | ICD-10-CM | POA: Diagnosis not present

## 2023-12-26 DIAGNOSIS — E1122 Type 2 diabetes mellitus with diabetic chronic kidney disease: Secondary | ICD-10-CM | POA: Diagnosis not present

## 2023-12-26 DIAGNOSIS — N390 Urinary tract infection, site not specified: Secondary | ICD-10-CM | POA: Diagnosis not present

## 2023-12-26 DIAGNOSIS — N189 Chronic kidney disease, unspecified: Secondary | ICD-10-CM | POA: Diagnosis not present

## 2023-12-26 DIAGNOSIS — I5020 Unspecified systolic (congestive) heart failure: Secondary | ICD-10-CM | POA: Diagnosis not present

## 2023-12-26 DIAGNOSIS — I130 Hypertensive heart and chronic kidney disease with heart failure and stage 1 through stage 4 chronic kidney disease, or unspecified chronic kidney disease: Secondary | ICD-10-CM | POA: Diagnosis not present

## 2023-12-26 DIAGNOSIS — D631 Anemia in chronic kidney disease: Secondary | ICD-10-CM | POA: Diagnosis not present

## 2023-12-26 DIAGNOSIS — J441 Chronic obstructive pulmonary disease with (acute) exacerbation: Secondary | ICD-10-CM | POA: Diagnosis not present

## 2023-12-26 DIAGNOSIS — L89152 Pressure ulcer of sacral region, stage 2: Secondary | ICD-10-CM | POA: Diagnosis not present

## 2023-12-27 DIAGNOSIS — N390 Urinary tract infection, site not specified: Secondary | ICD-10-CM | POA: Diagnosis not present

## 2023-12-27 DIAGNOSIS — N189 Chronic kidney disease, unspecified: Secondary | ICD-10-CM | POA: Diagnosis not present

## 2023-12-27 DIAGNOSIS — E1122 Type 2 diabetes mellitus with diabetic chronic kidney disease: Secondary | ICD-10-CM | POA: Diagnosis not present

## 2023-12-27 DIAGNOSIS — D631 Anemia in chronic kidney disease: Secondary | ICD-10-CM | POA: Diagnosis not present

## 2023-12-27 DIAGNOSIS — J441 Chronic obstructive pulmonary disease with (acute) exacerbation: Secondary | ICD-10-CM | POA: Diagnosis not present

## 2023-12-27 DIAGNOSIS — L89152 Pressure ulcer of sacral region, stage 2: Secondary | ICD-10-CM | POA: Diagnosis not present

## 2023-12-27 DIAGNOSIS — I5020 Unspecified systolic (congestive) heart failure: Secondary | ICD-10-CM | POA: Diagnosis not present

## 2023-12-27 DIAGNOSIS — I2510 Atherosclerotic heart disease of native coronary artery without angina pectoris: Secondary | ICD-10-CM | POA: Diagnosis not present

## 2023-12-27 DIAGNOSIS — I130 Hypertensive heart and chronic kidney disease with heart failure and stage 1 through stage 4 chronic kidney disease, or unspecified chronic kidney disease: Secondary | ICD-10-CM | POA: Diagnosis not present

## 2023-12-31 ENCOUNTER — Ambulatory Visit (INDEPENDENT_AMBULATORY_CARE_PROVIDER_SITE_OTHER): Admitting: Nurse Practitioner

## 2023-12-31 ENCOUNTER — Encounter: Payer: Self-pay | Admitting: Nurse Practitioner

## 2023-12-31 VITALS — BP 151/65 | HR 89 | Temp 98.3°F | Resp 16 | Ht 60.0 in | Wt 240.0 lb

## 2023-12-31 DIAGNOSIS — N390 Urinary tract infection, site not specified: Secondary | ICD-10-CM

## 2023-12-31 DIAGNOSIS — R319 Hematuria, unspecified: Secondary | ICD-10-CM

## 2023-12-31 DIAGNOSIS — Z09 Encounter for follow-up examination after completed treatment for conditions other than malignant neoplasm: Secondary | ICD-10-CM

## 2023-12-31 DIAGNOSIS — J441 Chronic obstructive pulmonary disease with (acute) exacerbation: Secondary | ICD-10-CM

## 2023-12-31 DIAGNOSIS — M5441 Lumbago with sciatica, right side: Secondary | ICD-10-CM | POA: Diagnosis not present

## 2023-12-31 DIAGNOSIS — M5442 Lumbago with sciatica, left side: Secondary | ICD-10-CM

## 2023-12-31 DIAGNOSIS — G8929 Other chronic pain: Secondary | ICD-10-CM

## 2023-12-31 LAB — POCT URINALYSIS DIPSTICK
Bilirubin, UA: NEGATIVE
Glucose, UA: NEGATIVE
Nitrite, UA: NEGATIVE
Protein, UA: NEGATIVE
Spec Grav, UA: 1.01 (ref 1.010–1.025)
Urobilinogen, UA: 0.2 U/dL
pH, UA: 5 (ref 5.0–8.0)

## 2023-12-31 MED ORDER — CIPROFLOXACIN HCL 250 MG PO TABS: 250.0000 mg | ORAL_TABLET | Freq: Two times a day (BID) | ORAL | 0 refills | Status: AC

## 2023-12-31 MED ORDER — HYDROCODONE-ACETAMINOPHEN 5-325 MG PO TABS
1.0000 | ORAL_TABLET | Freq: Two times a day (BID) | ORAL | 0 refills | Status: DC | PRN
Start: 2023-12-31 — End: 2024-02-10

## 2023-12-31 MED ORDER — NITROFURANTOIN MONOHYD MACRO 100 MG PO CAPS: 100.0000 mg | ORAL_CAPSULE | ORAL | 3 refills | Status: AC

## 2023-12-31 NOTE — Progress Notes (Signed)
 [facility] [place], [place] [address] [place] [address] [phone]                                   Transitional Care Clinic   [facility] Discharge Acute Issues Care Follow Up                                                                        Patient Demographics  Diana Bernard, is a 78 y.o. female  DOB 03/21/1946  MRN [id number].  Primary MD  Liana Fish, NP  Admit date: 12/15/2023 Discharge date: 12/20/2023  Reason for TCC follow Up - UTI and sepsis,COPD exacerbation   Past Medical History:  Diagnosis Date   Arthritis    Asthma    Cancer (HCC)    Deep vein thrombosis (DVT) (HCC) 2015   Depression    GERD (gastroesophageal reflux disease)    Hypertension    Weakness of both legs     Past Surgical History:  Procedure Laterality Date   ABCESS DRAINAGE  2016   Abdominal abcess due to diverticulitis   caridoverter defibrillator  11/14/2017   ICD   CATARACT EXTRACTION W/PHACO Left 10/26/2015   Procedure: CATARACT EXTRACTION PHACO AND INTRAOCULAR LENS PLACEMENT (IOC) LEFT EYE;  Surgeon: Dene Etienne, MD;  Location: [facility];  Service: Ophthalmology;  Laterality: Left;   CATARACT EXTRACTION W/PHACO Right 12/07/2015   Procedure: CATARACT EXTRACTION PHACO AND INTRAOCULAR LENS PLACEMENT (IOC);  Surgeon: Dene Etienne, MD;  Location: [facility];  Service: Ophthalmology;  Laterality: Right;   CHOLECYSTECTOMY     FOOT SURGERY     Per patient, bilateral foot surgery.   OSTOMY  01/25/2021   OSTOMY  02/11/2021   PELVIC FRACTURE SURGERY     Per patient for cancer.       Recent HPI and Hospital Course  Hospital Course:  Diana Bernard is a 78 year old female with a history of COPD, CHF (EF 50-55%), OSA, morbid obesity, rectal cancer s/p colostomy, remote endometrial cancer, and chronic kidney disease, who was admitted for weakness and shortness of breath and found to have a COPD  exacerbation and urinary tract infection.  COPD Exacerbation and Hypoxemia  She presented with several days of dyspnea, wheezing, non-productive cough, low-grade fever, and weakness, and was found to be hypoxic to the high 80s by EMS. Hypoxia improved with 2L oxygen  in the ED, and she was transitioned to 2L [place] during hospitalization. She declined steroids due to adverse effects and was transitioned from Advair to nebulized budesonide  and arformoterol BID, with continued duonebs and albuterol  PRN. Oxygen  was weaned as tolerated, and she completed a course of azithromycin . Her respiratory status improved, and she was saturating in the high 90s on room air at discharge.  Urinary Tract Infection and Bacteremia  She was found to have dysuria, dark urine, and confusion prior to ED presentation. UA was positive for leukocyte esterase and nitrite, and urine cultures grew Proteus vulgaris and Enterococcus faecalis. She has a history of MDR Pseudomonas aeruginosa UTI and was on nitrofurantoin  prophylaxis prior to admission, which was held during treatment. She was initially treated with cefepime,  then transitioned to meropenem  due to prior sensitivities. One of two blood cultures was positive for Staphylococcus epidermidis, considered a contaminant, and vancomycin was discontinued. Repeat blood cultures showed no growth at 48 hours and at 4 days. Her mental status improved with treatment.  Delirium  She experienced waxing and waning hypoactive delirium, likely secondary to acute illness and UTI, with periods of lethargy and confusion. Delirium resolved with treatment of infection and supportive care, and she was alert and oriented at discharge.  Pressure Injury (Coccyx/Sacrum)  A stage 3 pressure injury over the coccyx was present on admission, with moderate serosanguinous drainage and tenderness. WOCN recommended daily dressing changes with hydrofiber silver and silicone foam, turning, and offloading. The wound  was managed with frequent repositioning, pressure reduction, and skin protection interventions. No new pressure injuries developed during hospitalization.  Generalized Weakness, Mobility, and Functional Status  She had significant weakness and decreased endurance, requiring moderate to maximal assistance for ADLs and transfers. PT and OT recommended ongoing therapy 3x weekly post-discharge. She was able to ambulate short distances with a rolling walker and required assistance for bathing, toileting, and lower body dressing. Family support was present, and home health services were arranged for continued rehabilitation and wound care.  Hypertension and Heart Failure  Home lisinopril  and furosemide  were held due to acute kidney injury and hypotension; metoprolol  was continued except when MAP was below 70. She remained hemodynamically stable during hospitalization.  Anemia and Thrombocytopenia  She had chronic anemia and thrombocytopenia, with hemoglobin and platelet counts below normal throughout admission. No acute interventions were required for these findings.   Post Hospital Acute Care Issue to be followed in the Clinic   COPD exacerbation (POA: Yes) Active Problems: Hypertension (POA: Yes) Urinary tract infection (POA: Yes) History of MDR Pseudomonas aeruginosa infection (POA: Yes) Anemia (POA: Yes) Thrombocytopenia (POA: Yes) Elevated serum creatinine (POA: Yes)    Subjective:   Diana Bernard today has, No headache, No chest pain, No abdominal pain - No Nausea, No new weakness tingling or numbness, No Cough - SOB.   Assessment & Plan    1. Urinary tract infection with hematuria, site unspecified (Primary) Urine sent for culture. Take ciprofloxacin  as prescribed until gone.  - POCT urinalysis dipstick - CULTURE, URINE COMPREHENSIVE - ciprofloxacin  (CIPRO ) 250 MG tablet; Take 1 tablet (250 mg total) by mouth 2 (two) times daily for 5 days. Take with food  Dispense: 10 tablet;  Refill: 0  2. Recurrent UTI Resume prophylactic nitrofurantoin  3 times weekly after finishing 5 days of ciprofloxacin .  - nitrofurantoin , macrocrystal-monohydrate, (MACROBID ) 100 MG capsule; Take 1 capsule (100 mg total) by mouth every Monday, Wednesday, and Friday.  Dispense: 36 capsule; Refill: 3  3. Chronic obstructive pulmonary disease with acute exacerbation (HCC) Continue medications as prescribed.   4. Chronic bilateral low back pain with bilateral sciatica Continue prn hydrocodone  as prescribed.  - HYDROcodone -acetaminophen  (NORCO/VICODIN) 5-325 MG tablet; Take 1 tablet by mouth 2 (two) times daily as needed for severe pain (pain score 7-10).  Dispense: 60 tablet; Refill: 0  5. Hospital discharge follow-up Treated for copd exacerbation and work-up done for sepsis. Also record shows was treated for UTI but still having symptoms of UTI and her urine is cloudy.     Reason for frequent admissions/ER visits    COPD Anemia Recurrent UTI Hypertension Diabetes Recurrent pressure injuries Declining functional status Possible metastatic cancer found in multiple lymph nodes, waiting for further information from the oncologist at [facility].   Objective:  Vitals:   12/31/23 1412  BP: (!) 151/65  Pulse: 89  Resp: 16  Temp: 98.3 F (36.8 C)  SpO2: 95%  Weight: 240 lb (108.9 kg)  Height: 5' (1.524 m)    Wt Readings from Last 3 Encounters:  12/31/23 240 lb (108.9 kg)  12/09/23 243 lb (110.2 kg)  09/26/23 250 lb (113.4 kg)    Allergies as of 12/31/2023   No Known Allergies      Medication List        Accurate as of December 31, 2023  3:03 PM. If you have any questions, ask your nurse or doctor.          acetaminophen  500 MG tablet Commonly known as: TYLENOL  Take 500 mg by mouth every 8 (eight) hours. Take 2 tablets by mouth every 8 hours.   albuterol  108 (90 Base) MCG/ACT inhaler Commonly known as: VENTOLIN  HFA Inhale 2 puffs into the lungs every 6 (six) hours as  needed for wheezing or shortness of breath.   alendronate  70 MG tablet Commonly known as: FOSAMAX  TAKE 1 TABLET (70 MG TOTAL) BY MOUTH EVERY 7 (SEVEN) DAYS. TAKE WITH A FULL GLASS OF WATER ON AN EMPTY STOMACH.   allopurinol  100 MG tablet Commonly known as: ZYLOPRIM  TAKE 1 TABLET EVERY DAY   cholecalciferol 1000 units tablet Commonly known as: VITAMIN D  Take 1,000 Units by mouth daily.   ciprofloxacin  250 MG tablet Commonly known as: Cipro  Take 1 tablet (250 mg total) by mouth 2 (two) times daily for 5 days. Take with food Started by: Byard Carranza   clotrimazole 1 % cream Commonly known as: LOTRIMIN Apply 1 Application topically 2 (two) times daily. Apply topically 2x daily.   colchicine  0.6 MG tablet TAKE 1 TABLET EVERY DAY AS NEEDED FOR GOUT FLARE   diclofenac Sodium 1 % Gel Commonly known as: VOLTAREN Apply topically 4 (four) times daily.   diphenoxylate -atropine  2.5-0.025 MG tablet Commonly known as: LOMOTIL  Take 1 tablet by mouth 4 (four) times daily as needed for diarrhea or loose stools. Alternate with immodium   Dulaglutide  3 MG/0.5ML Soaj Inject 3 mg into the skin once a week.   esomeprazole 20 MG capsule Commonly known as: NEXIUM Take 20 mg by mouth daily at 12 noon.   fluticasone -salmeterol 500-50 MCG/ACT Aepb Commonly known as: ADVAIR INHALE 1 PUFF INTO THE LUNGS IN THE MORNING AND AT BEDTIME.   furosemide  20 MG tablet Commonly known as: LASIX  TAKE 1 TABLET EVERY DAY   gabapentin  300 MG capsule Commonly known as: NEURONTIN  TAKE 1 CAPSULE BY MOUTH IN MORNING, 1 CAPSULE AT MIDDAY AND 3 CAPSULES AT BEDTIME   HYDROcodone -acetaminophen  5-325 MG tablet Commonly known as: NORCO/VICODIN Take 1 tablet by mouth 2 (two) times daily as needed for severe pain (pain score 7-10).   ipratropium-albuterol  0.5-2.5 (3) MG/3ML Soln Commonly known as: DUONEB Take 3 mLs by nebulization every 4 (four) hours as needed.   lidocaine  4 % Place 1 patch onto the skin  daily.   lisinopril  40 MG tablet Commonly known as: ZESTRIL  Take 40 mg by mouth daily.   loratadine  10 MG tablet Commonly known as: CLARITIN  Take 1 tablet (10 mg total) by mouth daily.   magnesium oxide 400 (240 Mg) MG tablet Commonly known as: MAG-OX Take 400 mg by mouth 2 (two) times daily.   meloxicam  15 MG tablet Commonly known as: MOBIC  Take 1 tablet (15 mg total) by mouth daily. In am with breakfast. Do not take with ibuprofen   metoprolol  succinate 50 MG 24 hr tablet Commonly known as: TOPROL -XL TAKE 1 TABLET EVERY DAY WITH OR IMMEDIATELY FOLLOWING A MEAL   montelukast  10 MG tablet Commonly known as: SINGULAIR  Take 1 tablet (10 mg total) by mouth at bedtime.   multivitamin capsule Take 1 capsule by mouth daily.   nitrofurantoin  (macrocrystal-monohydrate) 100 MG capsule Commonly known as: MACROBID  Take 1 capsule (100 mg total) by mouth every Monday, Wednesday, and Friday. Start taking on: January 01, 2024 Started by: Mardy Maxin   nystatin  powder Commonly known as: MYCOSTATIN /NYSTOP  Apply 1 Application topically daily as needed (stoma rash/irritation).   ondansetron  4 MG disintegrating tablet Commonly known as: ZOFRAN -ODT Take 1 tablet (4 mg total) by mouth every 8 (eight) hours as needed for nausea or vomiting.   potassium chloride  SA 20 MEQ tablet Commonly known as: KLOR-CON  M TAKE 1 TABLET EVERY DAY   rosuvastatin  10 MG tablet Commonly known as: CRESTOR  TAKE 1 TABLET TWICE WEEKLY   venlafaxine  XR 37.5 MG 24 hr capsule Commonly known as: EFFEXOR -XR Take 1 capsule (37.5 mg total) by mouth daily.   VITAMIN B-12 PO Take by mouth. Pt takes [place] gummies         Physical Exam: Constitutional: Patient appears well-developed and well-nourished. Not in obvious distress. HENT: Normocephalic, atraumatic, External right and left ear normal. Oropharynx is clear and moist.  Eyes: Conjunctivae and EOM are normal. PERRLA, no scleral  icterus. Neck: Normal ROM. Neck supple. No JVD. No tracheal deviation. No thyromegaly. CVS: RRR, S1/S2 +, no murmurs, no gallops, no carotid bruit.  Pulmonary: Effort and breath sounds normal, no stridor, rhonchi, wheezes, rales.  Abdominal: Soft. BS +, no distension, tenderness, rebound or guarding.  Musculoskeletal: Normal range of motion. No edema and no tenderness.  Lymphadenopathy: No lymphadenopathy noted, cervical, inguinal or axillary Neuro: Alert. Normal reflexes, muscle tone coordination. No cranial nerve deficit. Skin: Skin is warm and dry. No rash noted. Not diaphoretic. No erythema. No pallor. Psychiatric: Normal mood and affect. Behavior, judgment, thought content normal.   Data Review   Micro Results No results found for this or any previous visit (from the past 240 hours).   CBC No results for input(s): WBC, HGB, HCT, PLT, MCV, MCH, MCHC, RDW, LYMPHSABS, MONOABS, EOSABS, BASOSABS, BANDABS in the last 168 hours.  Invalid input(s): NEUTRABS, BANDSABD  Chemistries  No results for input(s): NA, K, CL, CO2, GLUCOSE, BUN, CREATININE, CALCIUM , MG, AST, ALT, ALKPHOS, BILITOT in the last 168 hours.  Invalid input(s): GFRCGP ------------------------------------------------------------------------------------------------------------------ CrCl cannot be calculated (Patient's most recent lab result is older than the maximum 21 days allowed.). ------------------------------------------------------------------------------------------------------------------ No results for input(s): HGBA1C in the last 72 hours. ------------------------------------------------------------------------------------------------------------------ No results for input(s): CHOL, HDL, LDLCALC, TRIG, CHOLHDL, LDLDIRECT in the last 72  hours. ------------------------------------------------------------------------------------------------------------------ No results for input(s): TSH, T4TOTAL, T3FREE, THYROIDAB in the last 72 hours.  Invalid input(s): FREET3 ------------------------------------------------------------------------------------------------------------------ No results for input(s): VITAMINB12, FOLATE, FERRITIN, TIBC, IRON, RETICCTPCT in the last 72 hours.  Coagulation profile No results for input(s): INR, PROTIME in the last 168 hours.  No results for input(s): DDIMER in the last 72 hours.  Cardiac Enzymes No results for input(s): CKMB, TROPONINI, MYOGLOBIN in the last 168 hours.  Invalid input(s): CK ------------------------------------------------------------------------------------------------------------------ Invalid input(s): POCBNP  Return in about 12 weeks (around 03/24/2024) for F/U, pain med refill, Emerald Gehres PCP.   Time Spent in minutes  45 Time spent with patient included reviewing progress notes, labs, imaging studies, and discussing plan for follow up.  This patient was seen by Mardy Maxin, FNP-C in collaboration with Dr. Sigrid Bathe as a part of collaborative care agreement.    Mardy Maxin MSN, FNP-C on 12/31/2023 at 3:03 PM   **Disclaimer: This note may have been dictated with voice recognition software. Similar sounding words can inadvertently be transcribed and this note may contain transcription errors which may not have been corrected upon publication of note.**

## 2024-01-01 ENCOUNTER — Encounter: Payer: Self-pay | Admitting: Nurse Practitioner

## 2024-01-01 DIAGNOSIS — N390 Urinary tract infection, site not specified: Secondary | ICD-10-CM | POA: Diagnosis not present

## 2024-01-01 DIAGNOSIS — I2510 Atherosclerotic heart disease of native coronary artery without angina pectoris: Secondary | ICD-10-CM | POA: Diagnosis not present

## 2024-01-01 DIAGNOSIS — I130 Hypertensive heart and chronic kidney disease with heart failure and stage 1 through stage 4 chronic kidney disease, or unspecified chronic kidney disease: Secondary | ICD-10-CM | POA: Diagnosis not present

## 2024-01-01 DIAGNOSIS — J441 Chronic obstructive pulmonary disease with (acute) exacerbation: Secondary | ICD-10-CM | POA: Diagnosis not present

## 2024-01-01 DIAGNOSIS — E1122 Type 2 diabetes mellitus with diabetic chronic kidney disease: Secondary | ICD-10-CM | POA: Diagnosis not present

## 2024-01-01 DIAGNOSIS — N189 Chronic kidney disease, unspecified: Secondary | ICD-10-CM | POA: Diagnosis not present

## 2024-01-01 DIAGNOSIS — I5020 Unspecified systolic (congestive) heart failure: Secondary | ICD-10-CM | POA: Diagnosis not present

## 2024-01-01 DIAGNOSIS — D631 Anemia in chronic kidney disease: Secondary | ICD-10-CM | POA: Diagnosis not present

## 2024-01-01 DIAGNOSIS — L89152 Pressure ulcer of sacral region, stage 2: Secondary | ICD-10-CM | POA: Diagnosis not present

## 2024-01-01 MED ORDER — HYDROCODONE-ACETAMINOPHEN 5-325 MG PO TABS: 1.0000 | ORAL_TABLET | Freq: Two times a day (BID) | ORAL | 0 refills | Status: DC | PRN

## 2024-01-01 MED ORDER — HYDROCODONE-ACETAMINOPHEN 5-325 MG PO TABS: 1.0000 | ORAL_TABLET | Freq: Two times a day (BID) | ORAL | 0 refills | Status: AC | PRN

## 2024-01-02 DIAGNOSIS — I130 Hypertensive heart and chronic kidney disease with heart failure and stage 1 through stage 4 chronic kidney disease, or unspecified chronic kidney disease: Secondary | ICD-10-CM | POA: Diagnosis not present

## 2024-01-02 DIAGNOSIS — L89152 Pressure ulcer of sacral region, stage 2: Secondary | ICD-10-CM | POA: Diagnosis not present

## 2024-01-02 DIAGNOSIS — E1122 Type 2 diabetes mellitus with diabetic chronic kidney disease: Secondary | ICD-10-CM | POA: Diagnosis not present

## 2024-01-02 DIAGNOSIS — I2510 Atherosclerotic heart disease of native coronary artery without angina pectoris: Secondary | ICD-10-CM | POA: Diagnosis not present

## 2024-01-02 DIAGNOSIS — N189 Chronic kidney disease, unspecified: Secondary | ICD-10-CM | POA: Diagnosis not present

## 2024-01-02 DIAGNOSIS — D631 Anemia in chronic kidney disease: Secondary | ICD-10-CM | POA: Diagnosis not present

## 2024-01-02 DIAGNOSIS — I5020 Unspecified systolic (congestive) heart failure: Secondary | ICD-10-CM | POA: Diagnosis not present

## 2024-01-02 DIAGNOSIS — J441 Chronic obstructive pulmonary disease with (acute) exacerbation: Secondary | ICD-10-CM | POA: Diagnosis not present

## 2024-01-02 DIAGNOSIS — N390 Urinary tract infection, site not specified: Secondary | ICD-10-CM | POA: Diagnosis not present

## 2024-01-03 DIAGNOSIS — N390 Urinary tract infection, site not specified: Secondary | ICD-10-CM | POA: Diagnosis not present

## 2024-01-03 DIAGNOSIS — D631 Anemia in chronic kidney disease: Secondary | ICD-10-CM | POA: Diagnosis not present

## 2024-01-03 DIAGNOSIS — N189 Chronic kidney disease, unspecified: Secondary | ICD-10-CM | POA: Diagnosis not present

## 2024-01-03 DIAGNOSIS — I2510 Atherosclerotic heart disease of native coronary artery without angina pectoris: Secondary | ICD-10-CM | POA: Diagnosis not present

## 2024-01-03 DIAGNOSIS — I5020 Unspecified systolic (congestive) heart failure: Secondary | ICD-10-CM | POA: Diagnosis not present

## 2024-01-03 DIAGNOSIS — J441 Chronic obstructive pulmonary disease with (acute) exacerbation: Secondary | ICD-10-CM | POA: Diagnosis not present

## 2024-01-03 DIAGNOSIS — I130 Hypertensive heart and chronic kidney disease with heart failure and stage 1 through stage 4 chronic kidney disease, or unspecified chronic kidney disease: Secondary | ICD-10-CM | POA: Diagnosis not present

## 2024-01-03 DIAGNOSIS — L89152 Pressure ulcer of sacral region, stage 2: Secondary | ICD-10-CM | POA: Diagnosis not present

## 2024-01-03 DIAGNOSIS — E1122 Type 2 diabetes mellitus with diabetic chronic kidney disease: Secondary | ICD-10-CM | POA: Diagnosis not present

## 2024-01-03 LAB — CULTURE, URINE COMPREHENSIVE

## 2024-01-04 DIAGNOSIS — I509 Heart failure, unspecified: Secondary | ICD-10-CM | POA: Diagnosis not present

## 2024-01-04 DIAGNOSIS — Z95810 Presence of automatic (implantable) cardiac defibrillator: Secondary | ICD-10-CM | POA: Diagnosis not present

## 2024-01-05 ENCOUNTER — Encounter: Payer: Self-pay | Admitting: Nurse Practitioner

## 2024-01-05 DIAGNOSIS — Z933 Colostomy status: Secondary | ICD-10-CM | POA: Insufficient documentation

## 2024-01-06 DIAGNOSIS — E1122 Type 2 diabetes mellitus with diabetic chronic kidney disease: Secondary | ICD-10-CM | POA: Diagnosis not present

## 2024-01-06 DIAGNOSIS — I130 Hypertensive heart and chronic kidney disease with heart failure and stage 1 through stage 4 chronic kidney disease, or unspecified chronic kidney disease: Secondary | ICD-10-CM | POA: Diagnosis not present

## 2024-01-06 DIAGNOSIS — L89152 Pressure ulcer of sacral region, stage 2: Secondary | ICD-10-CM | POA: Diagnosis not present

## 2024-01-06 DIAGNOSIS — J441 Chronic obstructive pulmonary disease with (acute) exacerbation: Secondary | ICD-10-CM | POA: Diagnosis not present

## 2024-01-06 DIAGNOSIS — N189 Chronic kidney disease, unspecified: Secondary | ICD-10-CM | POA: Diagnosis not present

## 2024-01-06 DIAGNOSIS — I2510 Atherosclerotic heart disease of native coronary artery without angina pectoris: Secondary | ICD-10-CM | POA: Diagnosis not present

## 2024-01-06 DIAGNOSIS — D631 Anemia in chronic kidney disease: Secondary | ICD-10-CM | POA: Diagnosis not present

## 2024-01-06 DIAGNOSIS — N390 Urinary tract infection, site not specified: Secondary | ICD-10-CM | POA: Diagnosis not present

## 2024-01-06 DIAGNOSIS — I5020 Unspecified systolic (congestive) heart failure: Secondary | ICD-10-CM | POA: Diagnosis not present

## 2024-01-07 ENCOUNTER — Encounter: Attending: Physician Assistant | Admitting: Physician Assistant

## 2024-01-07 DIAGNOSIS — E11622 Type 2 diabetes mellitus with other skin ulcer: Secondary | ICD-10-CM | POA: Insufficient documentation

## 2024-01-07 DIAGNOSIS — Z86718 Personal history of other venous thrombosis and embolism: Secondary | ICD-10-CM | POA: Insufficient documentation

## 2024-01-07 DIAGNOSIS — I87303 Chronic venous hypertension (idiopathic) without complications of bilateral lower extremity: Secondary | ICD-10-CM | POA: Insufficient documentation

## 2024-01-07 DIAGNOSIS — M6281 Muscle weakness (generalized): Secondary | ICD-10-CM | POA: Diagnosis not present

## 2024-01-07 DIAGNOSIS — J4489 Other specified chronic obstructive pulmonary disease: Secondary | ICD-10-CM | POA: Diagnosis not present

## 2024-01-07 DIAGNOSIS — Z950 Presence of cardiac pacemaker: Secondary | ICD-10-CM | POA: Insufficient documentation

## 2024-01-07 DIAGNOSIS — L89153 Pressure ulcer of sacral region, stage 3: Secondary | ICD-10-CM | POA: Insufficient documentation

## 2024-01-07 DIAGNOSIS — I739 Peripheral vascular disease, unspecified: Secondary | ICD-10-CM | POA: Diagnosis not present

## 2024-01-07 DIAGNOSIS — I5042 Chronic combined systolic (congestive) and diastolic (congestive) heart failure: Secondary | ICD-10-CM | POA: Diagnosis not present

## 2024-01-08 DIAGNOSIS — I2510 Atherosclerotic heart disease of native coronary artery without angina pectoris: Secondary | ICD-10-CM | POA: Diagnosis not present

## 2024-01-08 DIAGNOSIS — D631 Anemia in chronic kidney disease: Secondary | ICD-10-CM | POA: Diagnosis not present

## 2024-01-08 DIAGNOSIS — E1122 Type 2 diabetes mellitus with diabetic chronic kidney disease: Secondary | ICD-10-CM | POA: Diagnosis not present

## 2024-01-08 DIAGNOSIS — N189 Chronic kidney disease, unspecified: Secondary | ICD-10-CM | POA: Diagnosis not present

## 2024-01-08 DIAGNOSIS — J441 Chronic obstructive pulmonary disease with (acute) exacerbation: Secondary | ICD-10-CM | POA: Diagnosis not present

## 2024-01-08 DIAGNOSIS — L89152 Pressure ulcer of sacral region, stage 2: Secondary | ICD-10-CM | POA: Diagnosis not present

## 2024-01-08 DIAGNOSIS — I5020 Unspecified systolic (congestive) heart failure: Secondary | ICD-10-CM | POA: Diagnosis not present

## 2024-01-08 DIAGNOSIS — N390 Urinary tract infection, site not specified: Secondary | ICD-10-CM | POA: Diagnosis not present

## 2024-01-08 DIAGNOSIS — I130 Hypertensive heart and chronic kidney disease with heart failure and stage 1 through stage 4 chronic kidney disease, or unspecified chronic kidney disease: Secondary | ICD-10-CM | POA: Diagnosis not present

## 2024-01-09 DIAGNOSIS — I5020 Unspecified systolic (congestive) heart failure: Secondary | ICD-10-CM | POA: Diagnosis not present

## 2024-01-09 DIAGNOSIS — I130 Hypertensive heart and chronic kidney disease with heart failure and stage 1 through stage 4 chronic kidney disease, or unspecified chronic kidney disease: Secondary | ICD-10-CM | POA: Diagnosis not present

## 2024-01-09 DIAGNOSIS — N189 Chronic kidney disease, unspecified: Secondary | ICD-10-CM | POA: Diagnosis not present

## 2024-01-09 DIAGNOSIS — I2510 Atherosclerotic heart disease of native coronary artery without angina pectoris: Secondary | ICD-10-CM | POA: Diagnosis not present

## 2024-01-09 DIAGNOSIS — J441 Chronic obstructive pulmonary disease with (acute) exacerbation: Secondary | ICD-10-CM | POA: Diagnosis not present

## 2024-01-09 DIAGNOSIS — D631 Anemia in chronic kidney disease: Secondary | ICD-10-CM | POA: Diagnosis not present

## 2024-01-09 DIAGNOSIS — N390 Urinary tract infection, site not specified: Secondary | ICD-10-CM | POA: Diagnosis not present

## 2024-01-09 DIAGNOSIS — L89152 Pressure ulcer of sacral region, stage 2: Secondary | ICD-10-CM | POA: Diagnosis not present

## 2024-01-09 DIAGNOSIS — E1122 Type 2 diabetes mellitus with diabetic chronic kidney disease: Secondary | ICD-10-CM | POA: Diagnosis not present

## 2024-01-10 ENCOUNTER — Other Ambulatory Visit: Payer: Self-pay | Admitting: Physician Assistant

## 2024-01-10 ENCOUNTER — Telehealth: Payer: Self-pay | Admitting: Nurse Practitioner

## 2024-01-10 DIAGNOSIS — R300 Dysuria: Secondary | ICD-10-CM

## 2024-01-10 MED ORDER — CIPROFLOXACIN HCL 250 MG PO TABS: 250.0000 mg | ORAL_TABLET | Freq: Two times a day (BID) | ORAL | 0 refills | Status: AC

## 2024-01-10 NOTE — Telephone Encounter (Signed)
 Nurse from [facility] lvm stating patient still having uti issues after finishing antibiotics. Per Tinnie, she will send in rx of cipro  to [facility] pharmacy. I lvm notifying patient-Toni

## 2024-01-13 DIAGNOSIS — I5020 Unspecified systolic (congestive) heart failure: Secondary | ICD-10-CM | POA: Diagnosis not present

## 2024-01-13 DIAGNOSIS — E1122 Type 2 diabetes mellitus with diabetic chronic kidney disease: Secondary | ICD-10-CM | POA: Diagnosis not present

## 2024-01-13 DIAGNOSIS — D631 Anemia in chronic kidney disease: Secondary | ICD-10-CM | POA: Diagnosis not present

## 2024-01-13 DIAGNOSIS — L89152 Pressure ulcer of sacral region, stage 2: Secondary | ICD-10-CM | POA: Diagnosis not present

## 2024-01-13 DIAGNOSIS — N189 Chronic kidney disease, unspecified: Secondary | ICD-10-CM | POA: Diagnosis not present

## 2024-01-13 DIAGNOSIS — I130 Hypertensive heart and chronic kidney disease with heart failure and stage 1 through stage 4 chronic kidney disease, or unspecified chronic kidney disease: Secondary | ICD-10-CM | POA: Diagnosis not present

## 2024-01-13 DIAGNOSIS — I2510 Atherosclerotic heart disease of native coronary artery without angina pectoris: Secondary | ICD-10-CM | POA: Diagnosis not present

## 2024-01-13 DIAGNOSIS — J441 Chronic obstructive pulmonary disease with (acute) exacerbation: Secondary | ICD-10-CM | POA: Diagnosis not present

## 2024-01-13 DIAGNOSIS — N390 Urinary tract infection, site not specified: Secondary | ICD-10-CM | POA: Diagnosis not present

## 2024-01-14 DIAGNOSIS — L89152 Pressure ulcer of sacral region, stage 2: Secondary | ICD-10-CM | POA: Diagnosis not present

## 2024-01-14 DIAGNOSIS — I2510 Atherosclerotic heart disease of native coronary artery without angina pectoris: Secondary | ICD-10-CM | POA: Diagnosis not present

## 2024-01-14 DIAGNOSIS — I130 Hypertensive heart and chronic kidney disease with heart failure and stage 1 through stage 4 chronic kidney disease, or unspecified chronic kidney disease: Secondary | ICD-10-CM | POA: Diagnosis not present

## 2024-01-14 DIAGNOSIS — D631 Anemia in chronic kidney disease: Secondary | ICD-10-CM | POA: Diagnosis not present

## 2024-01-14 DIAGNOSIS — E1122 Type 2 diabetes mellitus with diabetic chronic kidney disease: Secondary | ICD-10-CM | POA: Diagnosis not present

## 2024-01-14 DIAGNOSIS — N189 Chronic kidney disease, unspecified: Secondary | ICD-10-CM | POA: Diagnosis not present

## 2024-01-14 DIAGNOSIS — I5020 Unspecified systolic (congestive) heart failure: Secondary | ICD-10-CM | POA: Diagnosis not present

## 2024-01-14 DIAGNOSIS — J441 Chronic obstructive pulmonary disease with (acute) exacerbation: Secondary | ICD-10-CM | POA: Diagnosis not present

## 2024-01-14 DIAGNOSIS — N390 Urinary tract infection, site not specified: Secondary | ICD-10-CM | POA: Diagnosis not present

## 2024-01-15 DIAGNOSIS — D631 Anemia in chronic kidney disease: Secondary | ICD-10-CM | POA: Diagnosis not present

## 2024-01-15 DIAGNOSIS — N189 Chronic kidney disease, unspecified: Secondary | ICD-10-CM | POA: Diagnosis not present

## 2024-01-15 DIAGNOSIS — N390 Urinary tract infection, site not specified: Secondary | ICD-10-CM | POA: Diagnosis not present

## 2024-01-15 DIAGNOSIS — J441 Chronic obstructive pulmonary disease with (acute) exacerbation: Secondary | ICD-10-CM | POA: Diagnosis not present

## 2024-01-15 DIAGNOSIS — I130 Hypertensive heart and chronic kidney disease with heart failure and stage 1 through stage 4 chronic kidney disease, or unspecified chronic kidney disease: Secondary | ICD-10-CM | POA: Diagnosis not present

## 2024-01-15 DIAGNOSIS — I5020 Unspecified systolic (congestive) heart failure: Secondary | ICD-10-CM | POA: Diagnosis not present

## 2024-01-15 DIAGNOSIS — L89152 Pressure ulcer of sacral region, stage 2: Secondary | ICD-10-CM | POA: Diagnosis not present

## 2024-01-15 DIAGNOSIS — I2510 Atherosclerotic heart disease of native coronary artery without angina pectoris: Secondary | ICD-10-CM | POA: Diagnosis not present

## 2024-01-15 DIAGNOSIS — E1122 Type 2 diabetes mellitus with diabetic chronic kidney disease: Secondary | ICD-10-CM | POA: Diagnosis not present

## 2024-01-16 ENCOUNTER — Telehealth: Payer: Self-pay

## 2024-01-16 ENCOUNTER — Other Ambulatory Visit: Payer: Self-pay

## 2024-01-16 MED ORDER — CIPROFLOXACIN HCL 250 MG PO TABS: 250.0000 mg | ORAL_TABLET | Freq: Two times a day (BID) | ORAL | 0 refills | Status: AC

## 2024-01-16 NOTE — Telephone Encounter (Signed)
 Pt grand daughter called that she still having UTI symptoms as per alyssa advised hold for Macrobid  and take Cipro  and then she resume Macrobid  M.W and F and after finished antibiotic and she not feeling as per alyssa she need go to ED may need IVY antibiotic

## 2024-01-20 DIAGNOSIS — E1122 Type 2 diabetes mellitus with diabetic chronic kidney disease: Secondary | ICD-10-CM | POA: Diagnosis not present

## 2024-01-20 DIAGNOSIS — I5020 Unspecified systolic (congestive) heart failure: Secondary | ICD-10-CM | POA: Diagnosis not present

## 2024-01-20 DIAGNOSIS — N189 Chronic kidney disease, unspecified: Secondary | ICD-10-CM | POA: Diagnosis not present

## 2024-01-20 DIAGNOSIS — L89152 Pressure ulcer of sacral region, stage 2: Secondary | ICD-10-CM | POA: Diagnosis not present

## 2024-01-20 DIAGNOSIS — J441 Chronic obstructive pulmonary disease with (acute) exacerbation: Secondary | ICD-10-CM | POA: Diagnosis not present

## 2024-01-20 DIAGNOSIS — I2510 Atherosclerotic heart disease of native coronary artery without angina pectoris: Secondary | ICD-10-CM | POA: Diagnosis not present

## 2024-01-20 DIAGNOSIS — D631 Anemia in chronic kidney disease: Secondary | ICD-10-CM | POA: Diagnosis not present

## 2024-01-20 DIAGNOSIS — I130 Hypertensive heart and chronic kidney disease with heart failure and stage 1 through stage 4 chronic kidney disease, or unspecified chronic kidney disease: Secondary | ICD-10-CM | POA: Diagnosis not present

## 2024-01-20 DIAGNOSIS — N390 Urinary tract infection, site not specified: Secondary | ICD-10-CM | POA: Diagnosis not present

## 2024-01-21 ENCOUNTER — Encounter: Admitting: Physician Assistant

## 2024-01-21 DIAGNOSIS — I739 Peripheral vascular disease, unspecified: Secondary | ICD-10-CM | POA: Diagnosis not present

## 2024-01-21 DIAGNOSIS — D631 Anemia in chronic kidney disease: Secondary | ICD-10-CM | POA: Diagnosis not present

## 2024-01-21 DIAGNOSIS — I5042 Chronic combined systolic (congestive) and diastolic (congestive) heart failure: Secondary | ICD-10-CM | POA: Diagnosis not present

## 2024-01-21 DIAGNOSIS — Z86718 Personal history of other venous thrombosis and embolism: Secondary | ICD-10-CM | POA: Diagnosis not present

## 2024-01-21 DIAGNOSIS — I87303 Chronic venous hypertension (idiopathic) without complications of bilateral lower extremity: Secondary | ICD-10-CM | POA: Diagnosis not present

## 2024-01-21 DIAGNOSIS — M6281 Muscle weakness (generalized): Secondary | ICD-10-CM | POA: Diagnosis not present

## 2024-01-21 DIAGNOSIS — L89153 Pressure ulcer of sacral region, stage 3: Secondary | ICD-10-CM | POA: Diagnosis not present

## 2024-01-21 DIAGNOSIS — I2510 Atherosclerotic heart disease of native coronary artery without angina pectoris: Secondary | ICD-10-CM | POA: Diagnosis not present

## 2024-01-21 DIAGNOSIS — Z950 Presence of cardiac pacemaker: Secondary | ICD-10-CM | POA: Diagnosis not present

## 2024-01-21 DIAGNOSIS — J4489 Other specified chronic obstructive pulmonary disease: Secondary | ICD-10-CM | POA: Diagnosis not present

## 2024-01-21 DIAGNOSIS — I130 Hypertensive heart and chronic kidney disease with heart failure and stage 1 through stage 4 chronic kidney disease, or unspecified chronic kidney disease: Secondary | ICD-10-CM | POA: Diagnosis not present

## 2024-01-21 DIAGNOSIS — E11622 Type 2 diabetes mellitus with other skin ulcer: Secondary | ICD-10-CM | POA: Diagnosis not present

## 2024-01-21 DIAGNOSIS — L89152 Pressure ulcer of sacral region, stage 2: Secondary | ICD-10-CM | POA: Diagnosis not present

## 2024-01-21 DIAGNOSIS — N390 Urinary tract infection, site not specified: Secondary | ICD-10-CM | POA: Diagnosis not present

## 2024-01-21 DIAGNOSIS — N189 Chronic kidney disease, unspecified: Secondary | ICD-10-CM | POA: Diagnosis not present

## 2024-01-21 DIAGNOSIS — I5020 Unspecified systolic (congestive) heart failure: Secondary | ICD-10-CM | POA: Diagnosis not present

## 2024-01-21 DIAGNOSIS — E1122 Type 2 diabetes mellitus with diabetic chronic kidney disease: Secondary | ICD-10-CM | POA: Diagnosis not present

## 2024-01-21 DIAGNOSIS — J441 Chronic obstructive pulmonary disease with (acute) exacerbation: Secondary | ICD-10-CM | POA: Diagnosis not present

## 2024-01-23 DIAGNOSIS — D631 Anemia in chronic kidney disease: Secondary | ICD-10-CM | POA: Diagnosis not present

## 2024-01-23 DIAGNOSIS — I5020 Unspecified systolic (congestive) heart failure: Secondary | ICD-10-CM | POA: Diagnosis not present

## 2024-01-23 DIAGNOSIS — N189 Chronic kidney disease, unspecified: Secondary | ICD-10-CM | POA: Diagnosis not present

## 2024-01-23 DIAGNOSIS — N390 Urinary tract infection, site not specified: Secondary | ICD-10-CM | POA: Diagnosis not present

## 2024-01-23 DIAGNOSIS — I2510 Atherosclerotic heart disease of native coronary artery without angina pectoris: Secondary | ICD-10-CM | POA: Diagnosis not present

## 2024-01-23 DIAGNOSIS — L89152 Pressure ulcer of sacral region, stage 2: Secondary | ICD-10-CM | POA: Diagnosis not present

## 2024-01-23 DIAGNOSIS — I130 Hypertensive heart and chronic kidney disease with heart failure and stage 1 through stage 4 chronic kidney disease, or unspecified chronic kidney disease: Secondary | ICD-10-CM | POA: Diagnosis not present

## 2024-01-23 DIAGNOSIS — E1122 Type 2 diabetes mellitus with diabetic chronic kidney disease: Secondary | ICD-10-CM | POA: Diagnosis not present

## 2024-01-23 DIAGNOSIS — J441 Chronic obstructive pulmonary disease with (acute) exacerbation: Secondary | ICD-10-CM | POA: Diagnosis not present

## 2024-01-27 ENCOUNTER — Other Ambulatory Visit: Payer: Self-pay | Admitting: Nurse Practitioner

## 2024-01-27 ENCOUNTER — Encounter: Payer: Self-pay | Admitting: Nurse Practitioner

## 2024-01-27 DIAGNOSIS — R531 Weakness: Secondary | ICD-10-CM | POA: Diagnosis not present

## 2024-01-27 DIAGNOSIS — I2510 Atherosclerotic heart disease of native coronary artery without angina pectoris: Secondary | ICD-10-CM | POA: Diagnosis not present

## 2024-01-27 DIAGNOSIS — R0602 Shortness of breath: Secondary | ICD-10-CM | POA: Diagnosis not present

## 2024-01-27 DIAGNOSIS — J449 Chronic obstructive pulmonary disease, unspecified: Secondary | ICD-10-CM | POA: Diagnosis not present

## 2024-01-27 DIAGNOSIS — Z8542 Personal history of malignant neoplasm of other parts of uterus: Secondary | ICD-10-CM | POA: Diagnosis not present

## 2024-01-27 DIAGNOSIS — E875 Hyperkalemia: Secondary | ICD-10-CM | POA: Diagnosis not present

## 2024-01-27 DIAGNOSIS — I509 Heart failure, unspecified: Secondary | ICD-10-CM | POA: Diagnosis not present

## 2024-01-27 DIAGNOSIS — N390 Urinary tract infection, site not specified: Secondary | ICD-10-CM

## 2024-01-27 DIAGNOSIS — Z85048 Personal history of other malignant neoplasm of rectum, rectosigmoid junction, and anus: Secondary | ICD-10-CM | POA: Diagnosis not present

## 2024-01-27 DIAGNOSIS — N189 Chronic kidney disease, unspecified: Secondary | ICD-10-CM | POA: Diagnosis not present

## 2024-01-27 DIAGNOSIS — E1122 Type 2 diabetes mellitus with diabetic chronic kidney disease: Secondary | ICD-10-CM | POA: Diagnosis not present

## 2024-01-27 DIAGNOSIS — I129 Hypertensive chronic kidney disease with stage 1 through stage 4 chronic kidney disease, or unspecified chronic kidney disease: Secondary | ICD-10-CM | POA: Diagnosis not present

## 2024-01-27 DIAGNOSIS — Z20822 Contact with and (suspected) exposure to covid-19: Secondary | ICD-10-CM | POA: Diagnosis not present

## 2024-01-27 DIAGNOSIS — J441 Chronic obstructive pulmonary disease with (acute) exacerbation: Secondary | ICD-10-CM | POA: Diagnosis not present

## 2024-01-27 DIAGNOSIS — I5020 Unspecified systolic (congestive) heart failure: Secondary | ICD-10-CM | POA: Diagnosis not present

## 2024-01-27 DIAGNOSIS — L89152 Pressure ulcer of sacral region, stage 2: Secondary | ICD-10-CM | POA: Diagnosis not present

## 2024-01-27 DIAGNOSIS — D631 Anemia in chronic kidney disease: Secondary | ICD-10-CM | POA: Diagnosis not present

## 2024-01-27 DIAGNOSIS — C8590 Non-Hodgkin lymphoma, unspecified, unspecified site: Secondary | ICD-10-CM | POA: Diagnosis not present

## 2024-01-27 DIAGNOSIS — I130 Hypertensive heart and chronic kidney disease with heart failure and stage 1 through stage 4 chronic kidney disease, or unspecified chronic kidney disease: Secondary | ICD-10-CM | POA: Diagnosis not present

## 2024-01-28 DIAGNOSIS — I130 Hypertensive heart and chronic kidney disease with heart failure and stage 1 through stage 4 chronic kidney disease, or unspecified chronic kidney disease: Secondary | ICD-10-CM | POA: Diagnosis not present

## 2024-01-28 DIAGNOSIS — I509 Heart failure, unspecified: Secondary | ICD-10-CM | POA: Diagnosis not present

## 2024-01-28 DIAGNOSIS — I110 Hypertensive heart disease with heart failure: Secondary | ICD-10-CM | POA: Diagnosis not present

## 2024-01-28 DIAGNOSIS — B958 Unspecified staphylococcus as the cause of diseases classified elsewhere: Secondary | ICD-10-CM | POA: Diagnosis not present

## 2024-01-28 DIAGNOSIS — E875 Hyperkalemia: Secondary | ICD-10-CM | POA: Diagnosis not present

## 2024-01-28 DIAGNOSIS — J441 Chronic obstructive pulmonary disease with (acute) exacerbation: Secondary | ICD-10-CM | POA: Diagnosis not present

## 2024-01-28 DIAGNOSIS — R300 Dysuria: Secondary | ICD-10-CM | POA: Diagnosis not present

## 2024-01-28 DIAGNOSIS — N390 Urinary tract infection, site not specified: Secondary | ICD-10-CM | POA: Diagnosis not present

## 2024-01-28 DIAGNOSIS — E1122 Type 2 diabetes mellitus with diabetic chronic kidney disease: Secondary | ICD-10-CM | POA: Diagnosis not present

## 2024-01-28 DIAGNOSIS — Z8589 Personal history of malignant neoplasm of other organs and systems: Secondary | ICD-10-CM | POA: Diagnosis not present

## 2024-01-28 DIAGNOSIS — R7881 Bacteremia: Secondary | ICD-10-CM | POA: Diagnosis not present

## 2024-01-28 DIAGNOSIS — Z933 Colostomy status: Secondary | ICD-10-CM | POA: Diagnosis not present

## 2024-01-28 DIAGNOSIS — J449 Chronic obstructive pulmonary disease, unspecified: Secondary | ICD-10-CM | POA: Diagnosis not present

## 2024-01-28 DIAGNOSIS — N189 Chronic kidney disease, unspecified: Secondary | ICD-10-CM | POA: Diagnosis not present

## 2024-01-28 DIAGNOSIS — I5020 Unspecified systolic (congestive) heart failure: Secondary | ICD-10-CM | POA: Diagnosis not present

## 2024-01-28 DIAGNOSIS — I2510 Atherosclerotic heart disease of native coronary artery without angina pectoris: Secondary | ICD-10-CM | POA: Diagnosis not present

## 2024-01-28 DIAGNOSIS — A4901 Methicillin susceptible Staphylococcus aureus infection, unspecified site: Secondary | ICD-10-CM | POA: Diagnosis not present

## 2024-01-28 DIAGNOSIS — Z87440 Personal history of urinary (tract) infections: Secondary | ICD-10-CM | POA: Diagnosis not present

## 2024-01-28 DIAGNOSIS — L89152 Pressure ulcer of sacral region, stage 2: Secondary | ICD-10-CM | POA: Diagnosis not present

## 2024-01-28 DIAGNOSIS — I5032 Chronic diastolic (congestive) heart failure: Secondary | ICD-10-CM | POA: Diagnosis not present

## 2024-01-28 DIAGNOSIS — N3001 Acute cystitis with hematuria: Secondary | ICD-10-CM | POA: Diagnosis not present

## 2024-01-28 DIAGNOSIS — N179 Acute kidney failure, unspecified: Secondary | ICD-10-CM | POA: Diagnosis not present

## 2024-01-28 DIAGNOSIS — B954 Other streptococcus as the cause of diseases classified elsewhere: Secondary | ICD-10-CM | POA: Diagnosis not present

## 2024-01-28 DIAGNOSIS — D631 Anemia in chronic kidney disease: Secondary | ICD-10-CM | POA: Diagnosis not present

## 2024-01-28 DIAGNOSIS — C8290 Follicular lymphoma, unspecified, unspecified site: Secondary | ICD-10-CM | POA: Diagnosis not present

## 2024-01-29 ENCOUNTER — Ambulatory Visit: Admitting: Nurse Practitioner

## 2024-01-29 DIAGNOSIS — J449 Chronic obstructive pulmonary disease, unspecified: Secondary | ICD-10-CM | POA: Diagnosis not present

## 2024-01-29 DIAGNOSIS — I87303 Chronic venous hypertension (idiopathic) without complications of bilateral lower extremity: Secondary | ICD-10-CM | POA: Diagnosis not present

## 2024-01-29 DIAGNOSIS — S31101A Unspecified open wound of abdominal wall, left upper quadrant without penetration into peritoneal cavity, initial encounter: Secondary | ICD-10-CM | POA: Diagnosis not present

## 2024-01-29 DIAGNOSIS — L89153 Pressure ulcer of sacral region, stage 3: Secondary | ICD-10-CM | POA: Diagnosis not present

## 2024-01-29 DIAGNOSIS — Z933 Colostomy status: Secondary | ICD-10-CM | POA: Diagnosis not present

## 2024-01-29 DIAGNOSIS — Z433 Encounter for attention to colostomy: Secondary | ICD-10-CM | POA: Diagnosis not present

## 2024-01-29 DIAGNOSIS — R7881 Bacteremia: Secondary | ICD-10-CM | POA: Diagnosis not present

## 2024-01-29 DIAGNOSIS — D649 Anemia, unspecified: Secondary | ICD-10-CM | POA: Diagnosis not present

## 2024-01-29 DIAGNOSIS — B9689 Other specified bacterial agents as the cause of diseases classified elsewhere: Secondary | ICD-10-CM | POA: Diagnosis not present

## 2024-01-29 DIAGNOSIS — R300 Dysuria: Secondary | ICD-10-CM | POA: Diagnosis not present

## 2024-01-29 DIAGNOSIS — I10 Essential (primary) hypertension: Secondary | ICD-10-CM | POA: Diagnosis not present

## 2024-01-29 DIAGNOSIS — I7389 Other specified peripheral vascular diseases: Secondary | ICD-10-CM | POA: Diagnosis not present

## 2024-01-29 DIAGNOSIS — S31109A Unspecified open wound of abdominal wall, unspecified quadrant without penetration into peritoneal cavity, initial encounter: Secondary | ICD-10-CM | POA: Diagnosis not present

## 2024-01-29 DIAGNOSIS — E11622 Type 2 diabetes mellitus with other skin ulcer: Secondary | ICD-10-CM | POA: Diagnosis not present

## 2024-01-29 DIAGNOSIS — R8271 Bacteriuria: Secondary | ICD-10-CM | POA: Diagnosis not present

## 2024-01-29 DIAGNOSIS — S31829A Unspecified open wound of left buttock, initial encounter: Secondary | ICD-10-CM | POA: Diagnosis not present

## 2024-01-29 DIAGNOSIS — I5042 Chronic combined systolic (congestive) and diastolic (congestive) heart failure: Secondary | ICD-10-CM | POA: Diagnosis not present

## 2024-01-30 DIAGNOSIS — I130 Hypertensive heart and chronic kidney disease with heart failure and stage 1 through stage 4 chronic kidney disease, or unspecified chronic kidney disease: Secondary | ICD-10-CM | POA: Diagnosis not present

## 2024-01-30 DIAGNOSIS — Z79891 Long term (current) use of opiate analgesic: Secondary | ICD-10-CM | POA: Diagnosis not present

## 2024-01-30 DIAGNOSIS — E1122 Type 2 diabetes mellitus with diabetic chronic kidney disease: Secondary | ICD-10-CM | POA: Diagnosis not present

## 2024-01-30 DIAGNOSIS — J449 Chronic obstructive pulmonary disease, unspecified: Secondary | ICD-10-CM | POA: Diagnosis not present

## 2024-01-30 DIAGNOSIS — E1142 Type 2 diabetes mellitus with diabetic polyneuropathy: Secondary | ICD-10-CM | POA: Diagnosis not present

## 2024-01-30 DIAGNOSIS — C8510 Unspecified B-cell lymphoma, unspecified site: Secondary | ICD-10-CM | POA: Diagnosis not present

## 2024-01-30 DIAGNOSIS — N189 Chronic kidney disease, unspecified: Secondary | ICD-10-CM | POA: Diagnosis not present

## 2024-01-30 DIAGNOSIS — Z933 Colostomy status: Secondary | ICD-10-CM | POA: Diagnosis not present

## 2024-01-30 DIAGNOSIS — C8208 Follicular lymphoma grade I, lymph nodes of multiple sites: Secondary | ICD-10-CM | POA: Diagnosis not present

## 2024-01-30 DIAGNOSIS — I5032 Chronic diastolic (congestive) heart failure: Secondary | ICD-10-CM | POA: Diagnosis not present

## 2024-01-30 DIAGNOSIS — J42 Unspecified chronic bronchitis: Secondary | ICD-10-CM | POA: Diagnosis not present

## 2024-02-04 ENCOUNTER — Ambulatory Visit: Admitting: Physician Assistant

## 2024-02-05 ENCOUNTER — Other Ambulatory Visit: Payer: Self-pay | Admitting: Nurse Practitioner

## 2024-02-05 DIAGNOSIS — L89152 Pressure ulcer of sacral region, stage 2: Secondary | ICD-10-CM | POA: Diagnosis not present

## 2024-02-05 DIAGNOSIS — I2510 Atherosclerotic heart disease of native coronary artery without angina pectoris: Secondary | ICD-10-CM | POA: Diagnosis not present

## 2024-02-05 DIAGNOSIS — E1122 Type 2 diabetes mellitus with diabetic chronic kidney disease: Secondary | ICD-10-CM | POA: Diagnosis not present

## 2024-02-05 DIAGNOSIS — D631 Anemia in chronic kidney disease: Secondary | ICD-10-CM | POA: Diagnosis not present

## 2024-02-05 DIAGNOSIS — Z760 Encounter for issue of repeat prescription: Secondary | ICD-10-CM

## 2024-02-05 DIAGNOSIS — N189 Chronic kidney disease, unspecified: Secondary | ICD-10-CM | POA: Diagnosis not present

## 2024-02-05 DIAGNOSIS — I5020 Unspecified systolic (congestive) heart failure: Secondary | ICD-10-CM | POA: Diagnosis not present

## 2024-02-05 DIAGNOSIS — I130 Hypertensive heart and chronic kidney disease with heart failure and stage 1 through stage 4 chronic kidney disease, or unspecified chronic kidney disease: Secondary | ICD-10-CM | POA: Diagnosis not present

## 2024-02-05 DIAGNOSIS — J441 Chronic obstructive pulmonary disease with (acute) exacerbation: Secondary | ICD-10-CM | POA: Diagnosis not present

## 2024-02-05 DIAGNOSIS — N390 Urinary tract infection, site not specified: Secondary | ICD-10-CM | POA: Diagnosis not present

## 2024-02-06 ENCOUNTER — Inpatient Hospital Stay: Admitting: Nurse Practitioner

## 2024-02-06 ENCOUNTER — Telehealth: Payer: Self-pay

## 2024-02-06 NOTE — Telephone Encounter (Signed)
 Lmom to [facility] nurse [phone] to call us  back regarding skilled nursing

## 2024-02-06 NOTE — Telephone Encounter (Signed)
 [facility] nurse called that they are resume her care for skilled nursing and wound care [phone] and Diana Bernard aware

## 2024-02-07 DIAGNOSIS — I130 Hypertensive heart and chronic kidney disease with heart failure and stage 1 through stage 4 chronic kidney disease, or unspecified chronic kidney disease: Secondary | ICD-10-CM | POA: Diagnosis not present

## 2024-02-07 DIAGNOSIS — E1122 Type 2 diabetes mellitus with diabetic chronic kidney disease: Secondary | ICD-10-CM | POA: Diagnosis not present

## 2024-02-07 DIAGNOSIS — J4489 Other specified chronic obstructive pulmonary disease: Secondary | ICD-10-CM | POA: Diagnosis not present

## 2024-02-07 DIAGNOSIS — D649 Anemia, unspecified: Secondary | ICD-10-CM | POA: Diagnosis not present

## 2024-02-07 DIAGNOSIS — I5032 Chronic diastolic (congestive) heart failure: Secondary | ICD-10-CM | POA: Diagnosis not present

## 2024-02-07 DIAGNOSIS — Z933 Colostomy status: Secondary | ICD-10-CM | POA: Diagnosis not present

## 2024-02-07 DIAGNOSIS — Z433 Encounter for attention to colostomy: Secondary | ICD-10-CM | POA: Diagnosis not present

## 2024-02-07 DIAGNOSIS — Z5112 Encounter for antineoplastic immunotherapy: Secondary | ICD-10-CM | POA: Diagnosis not present

## 2024-02-07 DIAGNOSIS — C8208 Follicular lymphoma grade I, lymph nodes of multiple sites: Secondary | ICD-10-CM | POA: Diagnosis not present

## 2024-02-07 DIAGNOSIS — S31101A Unspecified open wound of abdominal wall, left upper quadrant without penetration into peritoneal cavity, initial encounter: Secondary | ICD-10-CM | POA: Diagnosis not present

## 2024-02-07 DIAGNOSIS — S31829A Unspecified open wound of left buttock, initial encounter: Secondary | ICD-10-CM | POA: Diagnosis not present

## 2024-02-07 DIAGNOSIS — N183 Chronic kidney disease, stage 3 unspecified: Secondary | ICD-10-CM | POA: Diagnosis not present

## 2024-02-10 ENCOUNTER — Encounter: Payer: Self-pay | Admitting: Nurse Practitioner

## 2024-02-10 ENCOUNTER — Ambulatory Visit (INDEPENDENT_AMBULATORY_CARE_PROVIDER_SITE_OTHER): Admitting: Nurse Practitioner

## 2024-02-10 VITALS — BP 154/97 | HR 69 | Temp 97.1°F | Resp 16 | Ht 60.0 in | Wt 238.8 lb

## 2024-02-10 DIAGNOSIS — N3001 Acute cystitis with hematuria: Secondary | ICD-10-CM

## 2024-02-10 DIAGNOSIS — Z09 Encounter for follow-up examination after completed treatment for conditions other than malignant neoplasm: Secondary | ICD-10-CM

## 2024-02-10 DIAGNOSIS — G8929 Other chronic pain: Secondary | ICD-10-CM

## 2024-02-10 DIAGNOSIS — I10 Essential (primary) hypertension: Secondary | ICD-10-CM

## 2024-02-10 DIAGNOSIS — M5441 Lumbago with sciatica, right side: Secondary | ICD-10-CM

## 2024-02-10 DIAGNOSIS — A4901 Methicillin susceptible Staphylococcus aureus infection, unspecified site: Secondary | ICD-10-CM

## 2024-02-10 DIAGNOSIS — M5442 Lumbago with sciatica, left side: Secondary | ICD-10-CM | POA: Diagnosis not present

## 2024-02-10 MED ORDER — HYDROCODONE-ACETAMINOPHEN 5-325 MG PO TABS: 1.0000 | ORAL_TABLET | Freq: Two times a day (BID) | ORAL | 0 refills | Status: AC | PRN

## 2024-02-10 NOTE — Progress Notes (Signed)
 [facility] [place] [address] [place] [address] [phone]                                   Transitional Care Clinic   [facility] Discharge Acute Issues Care Follow Up                                                                        Patient Demographics  Diana Bernard, is a 78 y.o. female  DOB [birth year]  MRN [id number].  Primary MD  Liana Fish, NP  Admit Date: 01/28/2024 Discharge Date: 01/29/2024  Reason for TCC follow Up - Acute Cystitis, staph aureus infection   Past Medical History:  Diagnosis Date   Arthritis    Asthma    Cancer (HCC)    Deep vein thrombosis (DVT) (HCC) 2015   Depression    GERD (gastroesophageal reflux disease)    Hypertension    Weakness of both legs     Past Surgical History:  Procedure Laterality Date   ABCESS DRAINAGE  2016   Abdominal abcess due to diverticulitis   caridoverter defibrillator  11/14/2017   ICD   CATARACT EXTRACTION W/PHACO Left 10/26/2015   Procedure: CATARACT EXTRACTION PHACO AND INTRAOCULAR LENS PLACEMENT (IOC) LEFT EYE;  Surgeon: Dene Etienne, MD;  Location: [facility];  Service: Ophthalmology;  Laterality: Left;   CATARACT EXTRACTION W/PHACO Right 12/07/2015   Procedure: CATARACT EXTRACTION PHACO AND INTRAOCULAR LENS PLACEMENT (IOC);  Surgeon: Dene Etienne, MD;  Location: [facility];  Service: Ophthalmology;  Laterality: Right;   CHOLECYSTECTOMY     FOOT SURGERY     Per patient, bilateral foot surgery.   OSTOMY  01/25/2021   OSTOMY  02/11/2021   PELVIC FRACTURE SURGERY     Per patient for cancer.       Recent HPI and Hospital Course  Hospital Course: 78 y.o. female with past medical history of COPD, HFrecEF, hypertension, recurrent urinary tract infections, history of colon cancer s/p colostomy and recently diagnosed lymphoma (not yet started chemotherapy) who presents with over a weak of feeling generally unwell,  new dysuria and staph aureus growing in urine culture.  Recurrent Urinary Tract Infection  Staph Aureus:  Patient with history of recurrent urinary tract infections with several different organisms including MDR pseudomonas and most recently E faecalis and Proetus at the end of August (sensitivities not run) and Klebsiella (r: ampicillin and nitrofurantoin ) at the beginning of September. It seems she has been on ciprofloxacin  for much of the month of September, presented yesterday with dysuria again and this time urine is growing 10-50k CFU of staph aureus. We obtained another UA and culture clean catch via I/O cath, which was not consistent with infection (no WBCs, nitrites, LE). Discussed with ID, who recommended following up blood cultures for 24 hours, but deferred treatment at this time given low concern for true UTI. Referral placed to urogyn for non-infectious causes of dysuria, although this may be an extended wait time.   Follicular Lymphoma Outpatient appointment rescheduled for 10/2.  The patient's hospital stay has been complicated by the following clinically significant conditions requiring additional evaluation and  treatment or having a significant effect of this patient's care: - Anemia POA requiring further investigation or monitoring - Chronic kidney disease POA requiring further investigation, treatment, or monitoring - Age related debility POA requiring additional resources: DME, PT, or OT   Touchbase with Outpatient Provider: Warm Handoff: Completed on 01/29/24 by Belvie Ozell Pearson, MD (Attending) via Epic Secure Chat with Dr Kingsley to set up followup   Post Hospital Acute Care Issue to be followed in the Clinic   Dysuria (POA: Yes) Active Problems: Hypertension (POA: Yes) Obesity, Class III, BMI 40-49.9 (morbid obesity) (CMS-HCC) (POA: Yes) Neuropathy due to chemotherapeutic drug (HHS-HCC) (POA: Yes) Low back pain due to displacement of intervertebral disc (POA:  Yes) Anemia (POA: Yes) Sacral wound (POA: Yes) Colostomy in place (CMS-HCC) (POA: Not Applicable) Type 2 diabetes mellitus with neurologic complication, without long-term current use of insulin (CMS-HCC) (POA: Yes) COPD (chronic obstructive pulmonary disease) (CMS-HCC) (POA: Yes) Follicular lymphoma grade I (CMS-HCC) (POA: Yes)    Subjective:   Diana Bernard today has, No headache, No chest pain, No abdominal pain - No Nausea, No new weakness tingling or numbness, No Cough - SOB.   Assessment & Plan   1. Staph aureus infection (Primary) Finish antibiotic as prescribed   2. Acute cystitis with hematuria Treated with antibiotic   3. Essential hypertension Elevated today, patient is in a lot of pain.   4. Chronic bilateral low back pain with bilateral sciatica Continue prn hydrocodone  as prescribed - HYDROcodone -acetaminophen  (NORCO/VICODIN) 5-325 MG tablet; Take 1 tablet by mouth 2 (two) times daily as needed for moderate pain (pain score 4-6) or severe pain (pain score 7-10).  Dispense: 60 tablet; Refill: 0 - HYDROcodone -acetaminophen  (NORCO/VICODIN) 5-325 MG tablet; Take 1 tablet by mouth 2 (two) times daily as needed for severe pain (pain score 7-10).  Dispense: 60 tablet; Refill: 0  5. Hospital discharge follow-up Treated in the hospital for a staph infection and UTI   Reason for frequent admissions/ER visits    metastatic cancer. Type 2 diabetes Hypertension, high cholesterol   Objective:   Vitals:   02/10/24 1442  BP: (!) 154/97  Pulse: 69  Resp: 16  Temp: (!) 97.1 F (36.2 C)  SpO2: 97%  Weight: 238 lb 12.8 oz (108.3 kg)  Height: 5' (1.524 m)    Wt Readings from Last 3 Encounters:  02/10/24 238 lb 12.8 oz (108.3 kg)  12/31/23 240 lb (108.9 kg)  12/09/23 243 lb (110.2 kg)    Allergies as of 02/10/2024   No Known Allergies      Medication List        Accurate as of February 10, 2024  3:22 PM. If you have any questions, ask your nurse or doctor.           acetaminophen  500 MG tablet Commonly known as: TYLENOL  Take 500 mg by mouth every 8 (eight) hours. Take 2 tablets by mouth every 8 hours.   albuterol  108 (90 Base) MCG/ACT inhaler Commonly known as: VENTOLIN  HFA Inhale 2 puffs into the lungs every 6 (six) hours as needed for wheezing or shortness of breath.   alendronate  70 MG tablet Commonly known as: FOSAMAX  TAKE 1 TABLET (70 MG TOTAL) BY MOUTH EVERY 7 (SEVEN) DAYS. TAKE WITH A FULL GLASS OF WATER ON AN EMPTY STOMACH.   allopurinol  100 MG tablet Commonly known as: ZYLOPRIM  TAKE 1 TABLET EVERY DAY   cholecalciferol 1000 units tablet Commonly known as: VITAMIN D  Take 1,000 Units by mouth daily.  ciprofloxacin  250 MG tablet Commonly known as: CIPRO  Take 1 tablet (250 mg total) by mouth 2 (two) times daily.   clotrimazole 1 % cream Commonly known as: LOTRIMIN Apply 1 Application topically 2 (two) times daily. Apply topically 2x daily.   colchicine  0.6 MG tablet TAKE 1 TABLET EVERY DAY AS NEEDED FOR GOUT FLARE   diclofenac Sodium 1 % Gel Commonly known as: VOLTAREN Apply topically 4 (four) times daily.   diphenoxylate -atropine  2.5-0.025 MG tablet Commonly known as: LOMOTIL  Take 1 tablet by mouth 4 (four) times daily as needed for diarrhea or loose stools. Alternate with immodium   Dulaglutide  3 MG/0.5ML Soaj Inject 3 mg into the skin once a week.   esomeprazole 20 MG capsule Commonly known as: NEXIUM Take 20 mg by mouth daily at 12 noon.   fluticasone -salmeterol 500-50 MCG/ACT Aepb Commonly known as: ADVAIR INHALE 1 PUFF INTO THE LUNGS IN THE MORNING AND AT BEDTIME.   furosemide  20 MG tablet Commonly known as: LASIX  TAKE 1 TABLET EVERY DAY   gabapentin  300 MG capsule Commonly known as: NEURONTIN  TAKE 1 CAPSULE BY MOUTH IN MORNING, 1 CAPSULE AT MIDDAY AND 3 CAPSULES AT BEDTIME   HYDROcodone -acetaminophen  5-325 MG tablet Commonly known as: NORCO/VICODIN Take 1 tablet by mouth 2 (two) times daily  as needed for moderate pain (pain score 4-6) or severe pain (pain score 7-10). Start taking on: February 26, 2024 What changed: Another medication with the same name was changed. Make sure you understand how and when to take each. Changed by: Kenn Rekowski   HYDROcodone -acetaminophen  5-325 MG tablet Commonly known as: NORCO/VICODIN Take 1 tablet by mouth 2 (two) times daily as needed for moderate pain (pain score 4-6) or severe pain (pain score 7-10). Start taking on: March 25, 2024 What changed: These instructions start on March 25, 2024. If you are unsure what to do until then, ask your doctor or other care provider. Changed by: Dink Creps   HYDROcodone -acetaminophen  5-325 MG tablet Commonly known as: NORCO/VICODIN Take 1 tablet by mouth 2 (two) times daily as needed for severe pain (pain score 7-10). Start taking on: April 22, 2024 What changed: These instructions start on April 22, 2024. If you are unsure what to do until then, ask your doctor or other care provider. Changed by: Leidi Astle   ipratropium-albuterol  0.5-2.5 (3) MG/3ML Soln Commonly known as: DUONEB Take 3 mLs by nebulization every 4 (four) hours as needed.   lidocaine  4 % Place 1 patch onto the skin daily.   lisinopril  40 MG tablet Commonly known as: ZESTRIL  Take 40 mg by mouth daily.   loratadine  10 MG tablet Commonly known as: CLARITIN  Take 1 tablet (10 mg total) by mouth daily.   magnesium oxide 400 (240 Mg) MG tablet Commonly known as: MAG-OX Take 400 mg by mouth 2 (two) times daily.   meloxicam  15 MG tablet Commonly known as: MOBIC  Take 1 tablet (15 mg total) by mouth daily. In am with breakfast. Do not take with ibuprofen    metoprolol  succinate 50 MG 24 hr tablet Commonly known as: TOPROL -XL TAKE 1 TABLET EVERY DAY WITH OR IMMEDIATELY FOLLOWING A MEAL   montelukast  10 MG tablet Commonly known as: SINGULAIR  Take 1 tablet (10 mg total) by mouth at bedtime.    multivitamin capsule Take 1 capsule by mouth daily.   nitrofurantoin  (macrocrystal-monohydrate) 100 MG capsule Commonly known as: MACROBID  Take 1 capsule (100 mg total) by mouth every Monday, Wednesday, and Friday.   nystatin  powder Commonly known as:  MYCOSTATIN /NYSTOP  Apply 1 Application topically daily as needed (stoma rash/irritation).   ondansetron  4 MG disintegrating tablet Commonly known as: ZOFRAN -ODT Take 1 tablet (4 mg total) by mouth every 8 (eight) hours as needed for nausea or vomiting.   potassium chloride  SA 20 MEQ tablet Commonly known as: KLOR-CON  M TAKE 1 TABLET EVERY DAY   rosuvastatin  10 MG tablet Commonly known as: CRESTOR  TAKE 1 TABLET TWICE WEEKLY   venlafaxine  XR 37.5 MG 24 hr capsule Commonly known as: EFFEXOR -XR Take 1 capsule (37.5 mg total) by mouth daily.   VITAMIN B-12 PO Take by mouth. Pt takes [place] gummies         Physical Exam: Constitutional: Patient appears well-developed and well-nourished. Not in obvious distress. HENT: Normocephalic, atraumatic, External right and left ear normal. Oropharynx is clear and moist.  Eyes: Conjunctivae and EOM are normal. PERRLA, no scleral icterus. Neck: Normal ROM. Neck supple. No JVD. No tracheal deviation. No thyromegaly. CVS: RRR, S1/S2 +, no murmurs, no gallops, no carotid bruit.  Pulmonary: Effort and breath sounds normal, no stridor, rhonchi, wheezes, rales.  Abdominal: Soft. BS +, no distension, tenderness, rebound or guarding.  Musculoskeletal: Normal range of motion. No edema and no tenderness.  Lymphadenopathy: No lymphadenopathy noted, cervical, inguinal or axillary Neuro: Alert. Normal reflexes, muscle tone coordination. No cranial nerve deficit. Skin: Skin is warm and dry. No rash noted. Not diaphoretic. No erythema. No pallor. Psychiatric: Normal mood and affect. Behavior, judgment, thought content normal.   Data Review   Micro Results No results found for this or any  previous visit (from the past 240 hours).   CBC No results for input(s): WBC, HGB, HCT, PLT, MCV, MCH, MCHC, RDW, LYMPHSABS, MONOABS, EOSABS, BASOSABS, BANDABS in the last 168 hours.  Invalid input(s): NEUTRABS, BANDSABD  Chemistries  No results for input(s): NA, K, CL, CO2, GLUCOSE, BUN, CREATININE, CALCIUM , MG, AST, ALT, ALKPHOS, BILITOT in the last 168 hours.  Invalid input(s): GFRCGP ------------------------------------------------------------------------------------------------------------------ CrCl cannot be calculated (Patient's most recent lab result is older than the maximum 21 days allowed.). ------------------------------------------------------------------------------------------------------------------ No results for input(s): HGBA1C in the last 72 hours. ------------------------------------------------------------------------------------------------------------------ No results for input(s): CHOL, HDL, LDLCALC, TRIG, CHOLHDL, LDLDIRECT in the last 72 hours. ------------------------------------------------------------------------------------------------------------------ No results for input(s): TSH, T4TOTAL, T3FREE, THYROIDAB in the last 72 hours.  Invalid input(s): FREET3 ------------------------------------------------------------------------------------------------------------------ No results for input(s): VITAMINB12, FOLATE, FERRITIN, TIBC, IRON, RETICCTPCT in the last 72 hours.  Coagulation profile No results for input(s): INR, PROTIME in the last 168 hours.  No results for input(s): DDIMER in the last 72 hours.  Cardiac Enzymes No results for input(s): CKMB, TROPONINI, MYOGLOBIN in the last 168 hours.  Invalid input(s): CK ------------------------------------------------------------------------------------------------------------------ Invalid input(s):  POCBNP  Return for pls reschedule december appt for 3 months from now for pain med refills. .   Time Spent in minutes  45 Time spent with patient included reviewing progress notes, labs, imaging studies, and discussing plan for follow up.    This patient was seen by Mardy Maxin, FNP-C in collaboration with Dr. Sigrid Bathe as a part of collaborative care agreement.    Mardy Maxin MSN, FNP-C on 02/10/2024 at 3:22 PM   **Disclaimer: This note may have been dictated with voice recognition software. Similar sounding words can inadvertently be transcribed and this note may contain transcription errors which may not have been corrected upon publication of note.**

## 2024-02-11 DIAGNOSIS — I5020 Unspecified systolic (congestive) heart failure: Secondary | ICD-10-CM | POA: Diagnosis not present

## 2024-02-11 DIAGNOSIS — N189 Chronic kidney disease, unspecified: Secondary | ICD-10-CM | POA: Diagnosis not present

## 2024-02-11 DIAGNOSIS — L89152 Pressure ulcer of sacral region, stage 2: Secondary | ICD-10-CM | POA: Diagnosis not present

## 2024-02-11 DIAGNOSIS — I130 Hypertensive heart and chronic kidney disease with heart failure and stage 1 through stage 4 chronic kidney disease, or unspecified chronic kidney disease: Secondary | ICD-10-CM | POA: Diagnosis not present

## 2024-02-11 DIAGNOSIS — D631 Anemia in chronic kidney disease: Secondary | ICD-10-CM | POA: Diagnosis not present

## 2024-02-11 DIAGNOSIS — J441 Chronic obstructive pulmonary disease with (acute) exacerbation: Secondary | ICD-10-CM | POA: Diagnosis not present

## 2024-02-11 DIAGNOSIS — I2510 Atherosclerotic heart disease of native coronary artery without angina pectoris: Secondary | ICD-10-CM | POA: Diagnosis not present

## 2024-02-11 DIAGNOSIS — N390 Urinary tract infection, site not specified: Secondary | ICD-10-CM | POA: Diagnosis not present

## 2024-02-11 DIAGNOSIS — E1122 Type 2 diabetes mellitus with diabetic chronic kidney disease: Secondary | ICD-10-CM | POA: Diagnosis not present

## 2024-02-12 ENCOUNTER — Encounter: Attending: Physician Assistant | Admitting: Physician Assistant

## 2024-02-12 DIAGNOSIS — E1151 Type 2 diabetes mellitus with diabetic peripheral angiopathy without gangrene: Secondary | ICD-10-CM | POA: Insufficient documentation

## 2024-02-12 DIAGNOSIS — I87303 Chronic venous hypertension (idiopathic) without complications of bilateral lower extremity: Secondary | ICD-10-CM | POA: Insufficient documentation

## 2024-02-12 DIAGNOSIS — I5042 Chronic combined systolic (congestive) and diastolic (congestive) heart failure: Secondary | ICD-10-CM | POA: Insufficient documentation

## 2024-02-12 DIAGNOSIS — M6281 Muscle weakness (generalized): Secondary | ICD-10-CM | POA: Insufficient documentation

## 2024-02-12 DIAGNOSIS — L89153 Pressure ulcer of sacral region, stage 3: Secondary | ICD-10-CM | POA: Diagnosis not present

## 2024-02-12 DIAGNOSIS — Z86718 Personal history of other venous thrombosis and embolism: Secondary | ICD-10-CM | POA: Insufficient documentation

## 2024-02-12 DIAGNOSIS — J4489 Other specified chronic obstructive pulmonary disease: Secondary | ICD-10-CM | POA: Insufficient documentation

## 2024-02-12 DIAGNOSIS — Z950 Presence of cardiac pacemaker: Secondary | ICD-10-CM | POA: Insufficient documentation

## 2024-02-12 DIAGNOSIS — E11622 Type 2 diabetes mellitus with other skin ulcer: Secondary | ICD-10-CM | POA: Insufficient documentation

## 2024-02-14 DIAGNOSIS — F32A Depression, unspecified: Secondary | ICD-10-CM | POA: Diagnosis not present

## 2024-02-14 DIAGNOSIS — J449 Chronic obstructive pulmonary disease, unspecified: Secondary | ICD-10-CM | POA: Diagnosis not present

## 2024-02-14 DIAGNOSIS — F419 Anxiety disorder, unspecified: Secondary | ICD-10-CM | POA: Diagnosis not present

## 2024-02-14 DIAGNOSIS — I509 Heart failure, unspecified: Secondary | ICD-10-CM | POA: Diagnosis not present

## 2024-02-14 DIAGNOSIS — C8208 Follicular lymphoma grade I, lymph nodes of multiple sites: Secondary | ICD-10-CM | POA: Diagnosis not present

## 2024-02-14 DIAGNOSIS — E66813 Obesity, class 3: Secondary | ICD-10-CM | POA: Diagnosis not present

## 2024-02-14 DIAGNOSIS — K219 Gastro-esophageal reflux disease without esophagitis: Secondary | ICD-10-CM | POA: Diagnosis not present

## 2024-02-14 DIAGNOSIS — I130 Hypertensive heart and chronic kidney disease with heart failure and stage 1 through stage 4 chronic kidney disease, or unspecified chronic kidney disease: Secondary | ICD-10-CM | POA: Diagnosis not present

## 2024-02-18 DIAGNOSIS — I2510 Atherosclerotic heart disease of native coronary artery without angina pectoris: Secondary | ICD-10-CM | POA: Diagnosis not present

## 2024-02-18 DIAGNOSIS — N390 Urinary tract infection, site not specified: Secondary | ICD-10-CM | POA: Diagnosis not present

## 2024-02-18 DIAGNOSIS — I5020 Unspecified systolic (congestive) heart failure: Secondary | ICD-10-CM | POA: Diagnosis not present

## 2024-02-18 DIAGNOSIS — N189 Chronic kidney disease, unspecified: Secondary | ICD-10-CM | POA: Diagnosis not present

## 2024-02-18 DIAGNOSIS — L89152 Pressure ulcer of sacral region, stage 2: Secondary | ICD-10-CM | POA: Diagnosis not present

## 2024-02-18 DIAGNOSIS — E1122 Type 2 diabetes mellitus with diabetic chronic kidney disease: Secondary | ICD-10-CM | POA: Diagnosis not present

## 2024-02-18 DIAGNOSIS — I130 Hypertensive heart and chronic kidney disease with heart failure and stage 1 through stage 4 chronic kidney disease, or unspecified chronic kidney disease: Secondary | ICD-10-CM | POA: Diagnosis not present

## 2024-02-18 DIAGNOSIS — J441 Chronic obstructive pulmonary disease with (acute) exacerbation: Secondary | ICD-10-CM | POA: Diagnosis not present

## 2024-02-18 DIAGNOSIS — D631 Anemia in chronic kidney disease: Secondary | ICD-10-CM | POA: Diagnosis not present

## 2024-02-20 ENCOUNTER — Encounter: Admitting: Physician Assistant

## 2024-02-20 DIAGNOSIS — L89153 Pressure ulcer of sacral region, stage 3: Secondary | ICD-10-CM | POA: Diagnosis not present

## 2024-02-21 DIAGNOSIS — Z66 Do not resuscitate: Secondary | ICD-10-CM | POA: Diagnosis not present

## 2024-02-21 DIAGNOSIS — C8208 Follicular lymphoma grade I, lymph nodes of multiple sites: Secondary | ICD-10-CM | POA: Diagnosis not present

## 2024-02-24 DIAGNOSIS — S31829A Unspecified open wound of left buttock, initial encounter: Secondary | ICD-10-CM | POA: Diagnosis not present

## 2024-02-24 DIAGNOSIS — L89153 Pressure ulcer of sacral region, stage 3: Secondary | ICD-10-CM | POA: Diagnosis not present

## 2024-02-24 DIAGNOSIS — Z433 Encounter for attention to colostomy: Secondary | ICD-10-CM | POA: Diagnosis not present

## 2024-02-24 DIAGNOSIS — Z933 Colostomy status: Secondary | ICD-10-CM | POA: Diagnosis not present

## 2024-02-24 DIAGNOSIS — I5042 Chronic combined systolic (congestive) and diastolic (congestive) heart failure: Secondary | ICD-10-CM | POA: Diagnosis not present

## 2024-02-24 DIAGNOSIS — E11622 Type 2 diabetes mellitus with other skin ulcer: Secondary | ICD-10-CM | POA: Diagnosis not present

## 2024-02-24 DIAGNOSIS — I7389 Other specified peripheral vascular diseases: Secondary | ICD-10-CM | POA: Diagnosis not present

## 2024-02-24 DIAGNOSIS — S31101A Unspecified open wound of abdominal wall, left upper quadrant without penetration into peritoneal cavity, initial encounter: Secondary | ICD-10-CM | POA: Diagnosis not present

## 2024-02-24 DIAGNOSIS — I87303 Chronic venous hypertension (idiopathic) without complications of bilateral lower extremity: Secondary | ICD-10-CM | POA: Diagnosis not present

## 2024-02-28 ENCOUNTER — Encounter: Payer: Self-pay | Admitting: Nurse Practitioner

## 2024-02-28 DIAGNOSIS — N179 Acute kidney failure, unspecified: Secondary | ICD-10-CM | POA: Diagnosis not present

## 2024-02-28 DIAGNOSIS — C8208 Follicular lymphoma grade I, lymph nodes of multiple sites: Secondary | ICD-10-CM | POA: Diagnosis not present

## 2024-03-02 DIAGNOSIS — R32 Unspecified urinary incontinence: Secondary | ICD-10-CM | POA: Diagnosis not present

## 2024-03-02 DIAGNOSIS — L98422 Non-pressure chronic ulcer of back with fat layer exposed: Secondary | ICD-10-CM | POA: Diagnosis not present

## 2024-03-02 DIAGNOSIS — Z7960 Long term (current) use of unspecified immunomodulators and immunosuppressants: Secondary | ICD-10-CM | POA: Diagnosis not present

## 2024-03-02 DIAGNOSIS — L89153 Pressure ulcer of sacral region, stage 3: Secondary | ICD-10-CM | POA: Diagnosis not present

## 2024-03-02 DIAGNOSIS — Z9889 Other specified postprocedural states: Secondary | ICD-10-CM | POA: Diagnosis not present

## 2024-03-02 DIAGNOSIS — E119 Type 2 diabetes mellitus without complications: Secondary | ICD-10-CM | POA: Diagnosis not present

## 2024-03-09 ENCOUNTER — Encounter: Payer: Self-pay | Admitting: Nurse Practitioner

## 2024-03-12 DIAGNOSIS — R509 Fever, unspecified: Secondary | ICD-10-CM | POA: Diagnosis not present

## 2024-03-12 DIAGNOSIS — B370 Candidal stomatitis: Secondary | ICD-10-CM | POA: Diagnosis not present

## 2024-03-12 DIAGNOSIS — C8208 Follicular lymphoma grade I, lymph nodes of multiple sites: Secondary | ICD-10-CM | POA: Diagnosis not present

## 2024-03-12 DIAGNOSIS — I130 Hypertensive heart and chronic kidney disease with heart failure and stage 1 through stage 4 chronic kidney disease, or unspecified chronic kidney disease: Secondary | ICD-10-CM | POA: Diagnosis not present

## 2024-03-12 DIAGNOSIS — J449 Chronic obstructive pulmonary disease, unspecified: Secondary | ICD-10-CM | POA: Diagnosis not present

## 2024-03-12 DIAGNOSIS — D8481 Immunodeficiency due to conditions classified elsewhere: Secondary | ICD-10-CM | POA: Diagnosis not present

## 2024-03-12 DIAGNOSIS — Z1152 Encounter for screening for COVID-19: Secondary | ICD-10-CM | POA: Diagnosis not present

## 2024-03-12 DIAGNOSIS — Z515 Encounter for palliative care: Secondary | ICD-10-CM | POA: Diagnosis not present

## 2024-03-12 DIAGNOSIS — Z85048 Personal history of other malignant neoplasm of rectum, rectosigmoid junction, and anus: Secondary | ICD-10-CM | POA: Diagnosis not present

## 2024-03-12 DIAGNOSIS — Z6841 Body Mass Index (BMI) 40.0 and over, adult: Secondary | ICD-10-CM | POA: Diagnosis not present

## 2024-03-12 DIAGNOSIS — D84821 Immunodeficiency due to drugs: Secondary | ICD-10-CM | POA: Diagnosis not present

## 2024-03-12 DIAGNOSIS — Z8572 Personal history of non-Hodgkin lymphomas: Secondary | ICD-10-CM | POA: Diagnosis not present

## 2024-03-12 DIAGNOSIS — Z8542 Personal history of malignant neoplasm of other parts of uterus: Secondary | ICD-10-CM | POA: Diagnosis not present

## 2024-03-12 DIAGNOSIS — E43 Unspecified severe protein-calorie malnutrition: Secondary | ICD-10-CM | POA: Diagnosis not present

## 2024-03-12 DIAGNOSIS — R1084 Generalized abdominal pain: Secondary | ICD-10-CM | POA: Diagnosis not present

## 2024-03-12 DIAGNOSIS — C786 Secondary malignant neoplasm of retroperitoneum and peritoneum: Secondary | ICD-10-CM | POA: Diagnosis not present

## 2024-03-12 DIAGNOSIS — I5020 Unspecified systolic (congestive) heart failure: Secondary | ICD-10-CM | POA: Diagnosis not present

## 2024-03-12 DIAGNOSIS — N189 Chronic kidney disease, unspecified: Secondary | ICD-10-CM | POA: Diagnosis not present

## 2024-03-12 DIAGNOSIS — C20 Malignant neoplasm of rectum: Secondary | ICD-10-CM | POA: Diagnosis not present

## 2024-03-14 NOTE — ED Provider Notes (Addendum)
 [facility] Emergency Department Provider Note   History, MDM, ED Course   History: History of Present Illness Diana Bernard is a 78 year old female with follicular lymphoma who presents with fever and generalized malaise. She is accompanied by 2 of her granddaughters.  She has been experiencing fever and generalized malaise since Tuesday, with a recorded temperature of 101F at home. Over the past week, she has become increasingly weak and confused, with her granddaughters noting increased disorientation.  She reports worsening abdominal pain that intensifies after eating, present for a couple of months but worsening in the last two weeks. This has led to a significant decrease in appetite, with minimal food intake, including a hamburger yesterday and some oatmeal and toast today. Nausea occurs when sitting up in certain positions.  She has a history of recurrent urinary tract infections and reports progressive loss of sensation in the perineal area, leading to urinary incontinence without awareness. Despite this, her urine output appears normal according to her family. She has been experiencing watery diarrhea from her colostomy for the past week or two, despite taking Imodium daily.  She has chronic shortness of breath and has required increased use of her sleep apnea machine during the day. She reports coughing and sneezing, which are not new symptoms for her.  She has been experiencing body aches, particularly when touched or moved, and reports that her body feels hot at night. No significant chest pain, but notes a slight difference in sensation over the past few days.  Pertinent exam:   Impression/Ddx: Medical Decision Making A 78 year old woman with a history of recently diagnosed follicular lymphoma on rituximab, COPD, heart failure with recovered ejection fraction, hypertension, recurrent UTIs, CKD, and prior colon cancer with colostomy presented with several days of fever,  malaise, worsening abdominal pain, poor oral intake, diarrhea from her colostomy, confusion, and decreased mobility. She was noted to be tachycardic and mildly febrile on arrival, with decreased breath sounds at the right lung base and new or worsening urinary incontinence with decreased perineal sensation. Exam revealed generalized pain with movement and decreased awareness of urination. Labs were notable for absence of leukocytosis and stable hemoglobin and creatinine.  Differential diagnosis includes, but is not limited to: - Sepsis in immunocompromised host (secondary to infection or lymphoma transformation): The patient is immunocompromised due to recent chemotherapy for follicular lymphoma and presents with fever, tachycardia, confusion, and poor oral intake, raising concern for sepsis from an infectious source or possible lymphoma transformation. - Gastrointestinal infection or colitis (including C. difficile): The patient has new-onset, persistent liquid diarrhea from her colostomy unresponsive to maximal Imodium, with associated abdominal pain and poor oral intake, suggesting a possible infectious or inflammatory GI process. - Urinary tract infection or neurogenic bladder: Worsening urinary incontinence with decreased perineal sensation and a history of recurrent UTIs raises concern for a urinary tract infection or neurogenic bladder as a potential source of infection or contributing to her symptoms. - Pneumonia: Decreased breath sounds at the right lung base and chronic respiratory symptoms in the context of immunosuppression raise the possibility of pneumonia as a source of infection.  Sepsis in immunocompromised host (secondary to infection or lymphoma transformation) - Started vancomycin and Zosyn for broad-spectrum coverage - Obtained blood cultures - Admit for further workup and monitoring  Gastrointestinal infection or colitis (including C. difficile) - Order CT abdomen, GI pathogen  panel, and C. difficile test  Urinary tract infection or neurogenic bladder - Order urinalysis and urine culture  Pneumonia - Order chest x-ray  Diagnostic work-up as below: Orders Placed This Encounter  Procedures  . Blood Culture #1  . Blood Culture #2  . Respiratory Pathogen Panel with COVID (Nasopharyngeal)  . Urine Culture  . C. difficile Assay  . GI Pathogen Panel (instead of Stool O&P)  . CMV PCR, Qualitative, Not Blood  . XR Chest 2 views  . CT Abdomen Pelvis W IV Contrast Only  . Lactate Sepsis, Venous  . Comprehensive Metabolic Panel  . CBC w/ Differential  . Urinalysis with Microscopy  . Magnesium Level  . Phosphorus Level  . Potassium Level  . AST  . Potassium Level  . AST  . hsTroponin I (single, no delta)  . Uric acid  . Lactate dehydrogenase  . CBC w/ Differential  . Hepatic Function Panel  . Basic Metabolic Panel  . Magnesium Level  . Phosphorus Level  . Hemoglobin A1c  . Sepsis Timer Started  . Vital signs  . Notify Provider  . RN to notify pharmacy immediately that an antibiotic has been ordered from the Sepsis Order Set (if not available in the [facility] or if not yet verified)  . Place sequential compression device  . Bladder Scan  . Inpatient consult to Nutrition Services  . Inpatient consult to Hematology  . Enteric Isolation Status  . Occupational Therapy Eval and Treat  . Physical Therapy Eval and Treat  . Adult CPAP  . ECG 12 Lead  . ED Admit Decision    Progress Notes: ED Course as of 03/15/24 0653  Sat Mar 14, 2024  2002 CBC w/ Differential(!):   WBC 9.5  RBC 3.61(!)  HGB 11.2(!)  HCT 33.5(!)  MCV 92.8  MCH 30.9  MCHC 33.3  RDW 17.0(!)  MPV 10.7  Platelet 195  Anisocytosis Slight(!) No leukocytosis, hemoglobin 11.2, appears at baseline.  2034 Comprehensive Metabolic Panel(!):   Sodium 140  Chloride 105  CO2 20.0  Anion Gap 15(!)  Bun 28(!)  Creatinine 1.27(!)  BUN/Creatinine Ratio 22  eGFR CKD-EPI (2021)  Female 44(!)  Glucose 104  Calcium  8.8  Albumin 3.4  Total Protein 7.4  Total Bilirubin 0.3  ALT 14  Alkaline Phosphatase 92 Creatinine 1.27 appears at baseline, BUN elevated at 28, anion gap slightly elevated at 15.  2034 Start vancomycin/Zosyn for suspected sepsis, unknown source currently.  2041 Respiratory Pathogen Panel with COVID (Nasopharyngeal):   Adenovirus PCR Not Detected  Coronavirus HKU1 Not Detected  Coronavirus NL63 Not Detected  Coronavirus 229E Not Detected  Coronavirus OC43 PCR Not Detected  Metapneumovirus Not Detected  Rhinovirus/Enterovirus Not Detected  Influenza A PCR Not Detected  Influenza B PCR Not Detected  Parainfluenza 1 Not Detected  Parainfluenza 2 Not Detected  Parainfluenza 3 Not Detected  Parainfluenza 4 Not Detected  RSV PCR Not Detected  B. pertussis PCR Not Detected  Bordetella parapertussis Not Detected  Chlamydophila (Chlamydia) pneumoniae Not Detected  Mycoplasma pneumoniae Not Detected  SARS-CoV-2 PCR Not Detected  2058 Magnesium Level(!):   Magnesium 1.2(!) Repleted magnesium.  2104 XR Chest 2 views  2142 C. difficile Assay:   C. difficile PCR Screen Negative  Sun Mar 15, 2024  0027 CT Abdomen Pelvis W IV Contrast Only CT abdomen with significantly increased abdominopelvic lymphadenopathy and soft tissue implants in the omentum and retroperitoneum, concerning for peritoneal carcinomatosis.  No clear abdominal infectious source.  0036 Paged [facility] for admission  0458 Reordered Zosyn  [phone] Signed out to incoming provider.  Pending admission.  MCAT  to provide recommendations. UA pending collection. Patient has not yet voided, ordered bladder scan.    __________________________________________________________________  The case was discussed with the attending physician who is in agreement with the above assessment and plan.  Additional Medical Decision Making   - Any discussion of this patient's case/presentation between myself and  consultants, admitting teams, or other team members has been documented above. - Imaging and other studies, if performed, that were available during my care of the patient were independently reviewed and interpreted by me and considered in my medical decision making as documented above. - External records reviewed: yes - Consideration of admission, observation, transfer, or escalation of care: likely will need admission  History   Chief Complaint:  Chief Complaint  Patient presents with  . Possible Sepsis   History of Present Illness:  As documented above.  Additional history during this encounter provided by: 2 granddaughters at bedside  Past Medical History: Past Medical History[1]  Medications:   Current Facility-Administered Medications:  .  acetaminophen  (TYLENOL ) tablet 1,000 mg, 1,000 mg, Oral, Q6H, Cornett, Fairy LABOR, MD, 1,000 mg at 03/15/24 9376 .  alendronate  (FOSAMAX ) tablet 70 mg, 70 mg, Oral, Q Sunday, Cornett, Fairy LABOR, MD .  allopurinol  (ZYLOPRIM ) tablet 300 mg, 300 mg, Oral, Daily, Cornett, Fairy LABOR, MD .  cyanocobalamin  (vitamin B-12) tablet 500 mcg, 500 mcg, Oral, Daily, Cornett, Joseph A, MD .  diclofenac sodium (VOLTAREN) 1 % gel 2 g, 2 g, Topical, QID, Cornett, Fairy LABOR, MD .  fluticasone  furoate-vilanterol (BREO ELLIPTA) 200-25 mcg/dose inhaler 1 puff, 1 puff, Inhalation, Daily (RT), Cornett, Fairy LABOR, MD .  gabapentin  (NEURONTIN ) capsule 300 mg, 300 mg, Oral, Q AM **AND** gabapentin  (NEURONTIN ) capsule 300 mg, 300 mg, Oral, Daily with lunch **AND** gabapentin  (NEURONTIN ) capsule 900 mg, 900 mg, Oral, Nightly, Cornett, Fairy LABOR, MD .  HYDROcodone -acetaminophen  (NORCO) 5-325 mg per tablet 1 tablet, 1 tablet, Oral, Q4H PRN **OR** HYDROcodone -acetaminophen  (NORCO) 5-325 mg per tablet 2 tablet, 2 tablet, Oral, Q4H PRN, Cornett, Fairy LABOR, MD .  ipratropium-albuterol  (DUO-NEB) 0.5-2.5 mg/3 mL nebulizer solution 3 mL, 3 mL, Inhalation, Q6H PRN, Cornett, Fairy LABOR, MD .   lidocaine  (ASPERCREME) 4 % 1 patch, 1 patch, Transdermal, Daily, Cornett, Fairy LABOR, MD .  [Provider Hold] lisinopril  (PRINIVIL ,ZESTRIL ) tablet 40 mg, 40 mg, Oral, QAM, Cornett, Fairy LABOR, MD .  [Provider Hold] magnesium oxide (MAG-OX) tablet 400 mg, 400 mg, Oral, Daily, Cornett, Fairy LABOR, MD .  [Provider Hold] metoPROLOL  succinate (Toprol -XL) 24 hr tablet 50 mg, 50 mg, Oral, Daily, Cornett, Fairy LABOR, MD .  montelukast  (SINGULAIR ) tablet 10 mg, 10 mg, Oral, Nightly, Cornett, Fairy LABOR, MD .  nystatin  (MYCOSTATIN ) oral suspension, 500,000 Units, Oral, QID, Cornett, Fairy LABOR, MD, 500,000 Units at 03/15/24 9374 .  ondansetron  (ZOFRAN -ODT) disintegrating tablet 4 mg, 4 mg, Oral, Q8H PRN, Cornett, Fairy LABOR, MD .  pantoprazole (Protonix) EC tablet 20 mg, 20 mg, Oral, Daily before breakfast, Cornett, Fairy LABOR, MD .  piperacillin-tazobactam (ZOSYN) 3.375 g in sodium chloride 0.9 % (NS) 100 mL IVPB-MBP, 3.375 g, Intravenous, Q6H, Badal, Merlynn MANIFOLD, MD, Last Rate: 200 mL/hr at 03/15/24 0625, 3.375 g at 03/15/24 9374 .  venlafaxine  (EFFEXOR -XR) 24 hr capsule 37.5 mg, 37.5 mg, Oral, Daily, Cornett, Fairy LABOR, MD  Current Outpatient Medications:  .  acetaminophen  (TYLENOL ) 500 MG tablet, Take 2 tablets (1,000 mg total) by mouth every eight (8) hours., Disp: 100 tablet, Rfl: 0 .  albuterol  HFA 90 mcg/actuation inhaler, Inhale 2 puffs  every four (4) hours as needed., Disp: , Rfl:  .  alendronate  (FOSAMAX ) 70 MG tablet, Take 1 tablet (70 mg total) by mouth every seven (7) days. WEDNESDAYS, Disp: 12 tablet, Rfl: 0 .  allopurinol  (ZYLOPRIM ) 100 MG tablet, Take 3 tablets (300 mg total) by mouth daily., Disp: 90 tablet, Rfl: 0 .  cholecalciferol, vitamin D3-50 mcg, 2,000 unit,, 50 mcg (2,000 unit) tablet, Take 1 tablet (50 mcg total) by mouth before bedtime., Disp: , Rfl:  .  colchicine  (COLCRYS ) 0.6 mg tablet, Take 1 tablet (0.6 mg total) by mouth daily as needed., Disp: , Rfl:  .  cyanocobalamin  500 MCG tablet, Take 1  tablet (500 mcg total) by mouth nightly., Disp: , Rfl:  .  diclofenac sodium (VOLTAREN) 1 % gel, APPLY 2 G TOPICALLY FOUR (4) TIMES A DAY., Disp: 700 g, Rfl: 0 .  esomeprazole (NEXIUM) 20 MG capsule, Take 1 capsule (20 mg total) by mouth daily before breakfast., Disp: , Rfl:  .  fluticasone  propion-salmeterol (ADVAIR, WIXELA) 500-50 mcg/dose diskus, Inhale 1 puff., Disp: , Rfl:  .  furosemide  (LASIX ) 20 MG tablet, Take 1 tablet (20 mg total) by mouth once daily as needed for edema/hypervolemia., Disp: 30 tablet, Rfl: 0 .  gabapentin  (NEURONTIN ) 300 MG capsule, 3 tablets at night (900mg ) and 1 tablet in the morning (300mg ) and 1 tablet at midday (300mg ), Disp: 150 capsule, Rfl: 2 .  HYDROcodone -acetaminophen  (NORCO) 5-325 mg per tablet, Take 1 tablet by mouth., Disp: , Rfl:  .  ipratropium-albuterol  (DUO-NEB) 0.5-2.5 mg/3 mL nebulizer, Inhale 3 mL every six (6) hours as needed., Disp: , Rfl:  .  lidocaine  4 % patch, Place 1 patch on the skin in the morning., Disp: , Rfl:  .  lisinopril  (PRINIVIL ,ZESTRIL ) 40 MG tablet, Take 1 tablet (40 mg total) by mouth every morning., Disp: 90 tablet, Rfl: 3 .  magnesium oxide (MAG-OX) 400 mg (241.3 mg elemental magnesium) tablet, Take 1 tablet (400 mg total) by mouth daily for 7 days., Disp: 120 tablet, Rfl: 0 .  meloxicam  (MOBIC ) 15 MG tablet, Take 1 tablet (15 mg total) by mouth daily., Disp: , Rfl:  .  metoPROLOL  succinate (TOPROL -XL) 50 MG 24 hr tablet, Take 1 tablet (50 mg total) by mouth daily., Disp: 90 tablet, Rfl: 3 .  montelukast  (SINGULAIR ) 10 mg tablet, Take 1 tablet (10 mg total) by mouth nightly., Disp: , Rfl:  .  nystatin  (MYCOSTATIN ) 100,000 unit/mL suspension, Take 5 mL (500,000 Units total) by mouth four (4) times a day for 7 days., Disp: 140 mL, Rfl: 0 .  ondansetron  (ZOFRAN -ODT) 4 MG disintegrating tablet, Dissolve 1 tablet (4 mg total) in the mouth every eight (8) hours as needed for nausea., Disp: 90 tablet, Rfl: 3 .  potassium chloride  20 MEQ  ER tablet, Take 1 tablet (20 mEq total) by mouth daily., Disp: , Rfl:  .  predniSONE  (DELTASONE ) 50 MG tablet, Take 1 tablet (50 mg total) by mouth daily., Disp: 7 tablet, Rfl: 0 .  prochlorperazine (COMPAZINE) 5 MG tablet, Take 1 tablet (5 mg total) by mouth every six (6) hours as needed for nausea., Disp: 30 tablet, Rfl: 0 .  rosuvastatin  (CRESTOR ) 10 MG tablet, Take 1 tablet (10 mg total) by mouth 2 times a week (Tuesday and Saturday)., Disp: 8 tablet, Rfl: 0 .  TRULICITY  3 mg/0.5 mL injection pen, INJECT 3 MG INTO THE SKIN ONCE A WEEK, Disp: , Rfl:  .  venlafaxine  (EFFEXOR -XR) 37.5 MG 24 hr capsule, Take  1 capsule (37.5 mg total) by mouth daily., Disp: , Rfl:  Patient's Medications  New Prescriptions   No medications on file  Previous Medications   ACETAMINOPHEN  (TYLENOL ) 500 MG TABLET    Take 2 tablets (1,000 mg total) by mouth every eight (8) hours.   ALBUTEROL  HFA 90 MCG/ACTUATION INHALER    Inhale 2 puffs every four (4) hours as needed.   ALENDRONATE  (FOSAMAX ) 70 MG TABLET    Take 1 tablet (70 mg total) by mouth every seven (7) days. WEDNESDAYS   ALLOPURINOL  (ZYLOPRIM ) 100 MG TABLET    Take 3 tablets (300 mg total) by mouth daily.   CHOLECALCIFEROL, VITAMIN D3-50 MCG, 2,000 UNIT,, 50 MCG (2,000 UNIT) TABLET    Take 1 tablet (50 mcg total) by mouth before bedtime.   COLCHICINE  (COLCRYS ) 0.6 MG TABLET    Take 1 tablet (0.6 mg total) by mouth daily as needed.   CYANOCOBALAMIN  500 MCG TABLET    Take 1 tablet (500 mcg total) by mouth nightly.   DICLOFENAC SODIUM (VOLTAREN) 1 % GEL    APPLY 2 G TOPICALLY FOUR (4) TIMES A DAY.   ESOMEPRAZOLE (NEXIUM) 20 MG CAPSULE    Take 1 capsule (20 mg total) by mouth daily before breakfast.   FLUTICASONE  PROPION-SALMETEROL (ADVAIR, WIXELA) 500-50 MCG/DOSE DISKUS    Inhale 1 puff.   FUROSEMIDE  (LASIX ) 20 MG TABLET    Take 1 tablet (20 mg total) by mouth once daily as needed for edema/hypervolemia.   GABAPENTIN  (NEURONTIN ) 300 MG CAPSULE    3 tablets at night  (900mg ) and 1 tablet in the morning (300mg ) and 1 tablet at midday (300mg )   HYDROCODONE -ACETAMINOPHEN  (NORCO) 5-325 MG PER TABLET    Take 1 tablet by mouth.   IPRATROPIUM-ALBUTEROL  (DUO-NEB) 0.5-2.5 MG/3 ML NEBULIZER    Inhale 3 mL every six (6) hours as needed.   LIDOCAINE  4 % PATCH    Place 1 patch on the skin in the morning.   LISINOPRIL  (PRINIVIL ,ZESTRIL ) 40 MG TABLET    Take 1 tablet (40 mg total) by mouth every morning.   MAGNESIUM OXIDE (MAG-OX) 400 MG (241.3 MG ELEMENTAL MAGNESIUM) TABLET    Take 1 tablet (400 mg total) by mouth daily for 7 days.   MELOXICAM  (MOBIC ) 15 MG TABLET    Take 1 tablet (15 mg total) by mouth daily.   METOPROLOL  SUCCINATE (TOPROL -XL) 50 MG 24 HR TABLET    Take 1 tablet (50 mg total) by mouth daily.   MONTELUKAST  (SINGULAIR ) 10 MG TABLET    Take 1 tablet (10 mg total) by mouth nightly.   NYSTATIN  (MYCOSTATIN ) 100,000 UNIT/ML SUSPENSION    Take 5 mL (500,000 Units total) by mouth four (4) times a day for 7 days.   ONDANSETRON  (ZOFRAN -ODT) 4 MG DISINTEGRATING TABLET    Dissolve 1 tablet (4 mg total) in the mouth every eight (8) hours as needed for nausea.   POTASSIUM CHLORIDE  20 MEQ ER TABLET    Take 1 tablet (20 mEq total) by mouth daily.   PREDNISONE  (DELTASONE ) 50 MG TABLET    Take 1 tablet (50 mg total) by mouth daily.   PROCHLORPERAZINE (COMPAZINE) 5 MG TABLET    Take 1 tablet (5 mg total) by mouth every six (6) hours as needed for nausea.   ROSUVASTATIN  (CRESTOR ) 10 MG TABLET    Take 1 tablet (10 mg total) by mouth 2 times a week (Tuesday and Saturday).   TRULICITY  3 MG/0.5 ML INJECTION PEN    INJECT  3 MG INTO THE SKIN ONCE A WEEK   VENLAFAXINE  (EFFEXOR -XR) 37.5 MG 24 HR CAPSULE    Take 1 capsule (37.5 mg total) by mouth daily.  Modified Medications   No medications on file  Discontinued Medications   No medications on file    Allergies:  Grass pollen-june grass standard  Past Surgical History:  Past Surgical History[2]  Social History:  Social  History   Tobacco Use  . Smoking status: Never    Passive exposure: Never  . Smokeless tobacco: Never  Substance Use Topics  . Alcohol use: No    Family History: Family History[3]   Physical Exam   Vital Signs:   BP 129/74   Pulse 78   Temp 36.6 C (97.8 F) (Oral)   Resp 16   Wt (!) 101.6 kg (224 lb)   SpO2 98%   BMI 43.75 kg/m   General: Alert and oriented to self, place, and time. Not fully oriented to situation. Chronically ill-appearing. Skin: Skin is warm and well-perfused. No rashes noted. HEENT: Normocephalic and atraumatic, dry mucous membranes, no nasal drainage, no intra-oral lesions or erythema. Lungs: Normal respiratory effort. Decreased breath sounds on R lung base. Heart: Rate as above, regular rhythm. No murmurs appreciated. Abdomen: Soft and diffusely tender to palpation, non-distended. RUQ colostomy bag with loose brown stool.  Extremities: No peripheral edema or clubbing. Pulses are normal and symmetric in upper and lower extremities. Neurological: Normal speech and language. No gross focal neurologic deficits are appreciated. Psychiatric: Mood and affect are normal. Normal speech and behavior.   Radiology   CT Abdomen Pelvis W IV Contrast Only  Final Result  In comparison to CT abdomen pelvis dated 11/14/2023:  --There has been significant interval increase in abdominopelvic lymphadenopathy, predominantly involving the retroperitoneum.     --Interval development of soft tissue implants in the omentum and retroperitoneum, concerning for peritoneal carcinomatosis.    --Sequelae of prior APR with right lower quadrant end colostomy. There is a persistent large mesenteric fat and bowel containing parastomal hernia without associated bowel dilatation or stranding to suggest strangulation/ischemia.    --Chronic and incidental findings, as described in the body of the report.      I, Adine Caraway, MD, have personally reviewed the images and concur with  the resident preliminary report above. This report now represents the final report for this patient.        XR Chest 2 views  Final Result    No acute cardiopulmonary process seen.            Labs   Labs Reviewed  COMPREHENSIVE METABOLIC PANEL - Abnormal; Notable for the following components:      Result Value   Anion Gap 15 (*)    BUN 28 (*)    Creatinine 1.27 (*)    eGFR CKD-EPI (2021) Female 77 (*)    All other components within normal limits  MAGNESIUM - Abnormal; Notable for the following components:   Magnesium 1.2 (*)    All other components within normal limits  CBC W/ AUTO DIFF - Abnormal; Notable for the following components:   RBC 3.61 (*)    HGB 11.2 (*)    HCT 33.5 (*)    RDW 17.0 (*)    Absolute Monocytes 1.1 (*)    Anisocytosis Slight (*)    All other components within normal limits   Narrative:    WBC, PLT and/or Differential requires smear review. Expected TAT is 2 hours.  CBC W/  AUTO DIFF - Abnormal; Notable for the following components:   RBC 3.20 (*)    HGB 10.0 (*)    HCT 29.6 (*)    RDW 16.9 (*)    Platelet 116 (*)    Absolute Lymphocytes 0.9 (*)    Anisocytosis Slight (*)    All other components within normal limits  RESPIRATORY PATHOGEN PANEL - Normal   Narrative:    This result was obtained using the FDA-cleared [employer] Respiratory 2.1 Panel. Performance characteristics have been established and verified by the Clinical Molecular Microbiology Laboratory, [facility]. This assay does not distinguish between rhinovirus and enterovirus. Lower respiratory specimens will not be tested for Bordetella pertussis/parapertussis. For nasopharyngeal swabs, cross-reactivity may occur between B. pertussis and non-pertussis Bordetella species.  CLOSTRIDIUM DIFFICILE ASSAY - Normal  LACTATE SEPSIS, VENOUS - Normal  PHOSPHORUS - Normal  POTASSIUM - Normal  AST - Normal  HIGH SENSITIVITY TROPONIN I - SINGLE - Normal  BLOOD CULTURE  BLOOD CULTURE   URINE CULTURE  GI PATHOGEN PANEL  CMV PCR, QUALITATIVE, NOT BLOOD  CBC W/ DIFFERENTIAL   Narrative:    The following orders were created for panel order CBC w/ Differential.                 Procedure                               Abnormality         Status                                    ---------                               -----------         ------                                    CBC w/ [id number]         Abnormal            Final result                              Morphology [id number]]                               Final result                                               Please view results for these tests on the individual orders.  CBC W/ DIFFERENTIAL   Narrative:    The following orders were created for panel order CBC w/ Differential.                 Procedure                               Abnormality         Status                                    ---------                               -----------         ------  CBC w/ Differential[[id number]]         Abnormal            Final result                                               Please view results for these tests on the individual orders.  URINALYSIS WITH MICROSCOPY  POTASSIUM  AST  URIC ACID  LACTATE DEHYDROGENASE  HEPATIC FUNCTION PANEL  BASIC METABOLIC PANEL  MAGNESIUM  PHOSPHORUS  HEMOGLOBIN A1C  SLIDE REVIEW   _____________________________________________________________________  Please note - This documentation was generated using dictation and/or voice recognition software, and as such, may contain spelling or other transcription errors. Any questions regarding the content of this documentation should be directed to the individual who electronically signed.     Uvaldo Merlynn MANIFOLD, MD Resident 03/15/24 0559    Uvaldo Merlynn MANIFOLD, MD Resident 03/15/24 [phone]    Uvaldo Merlynn MANIFOLD, MD Resident 03/15/24 [phone]      [1] Past Medical  History: Diagnosis Date  . Allergic 2010  . Anemia   . Anxiety   . Arthritis   . Asthma (HHS-HCC)   . Cataract   . CHF (congestive heart failure) (CMS-HCC)   . Chronic diarrhea   . Chronic kidney disease   . COPD (chronic obstructive pulmonary disease) (CMS-HCC)   . Depression   . Diverticulitis   . DVT (deep venous thrombosis) (CMS-HCC) 2015   Left leg - post op  . Endometrial cancer (CMS-HCC) 2015   s/p hysterectomy, s/p chemotherapy and radiation treatment  . Fractures    humerus right  . GERD (gastroesophageal reflux disease)   . Heart disease 2005  . Hypertension   . Joint pain   . Kyphosis   . Left bundle branch block 09/04/2017  . Lymphoma    (CMS-HCC)   . Obesity, Class III, BMI 40-49.9 (morbid obesity) (CMS-HCC)   . Rectal cancer    (CMS-HCC)   . Shoulder injury   . Sleep apnea   . Squamous cell skin cancer   [2] Past Surgical History: Procedure Laterality Date  . ATRIAL CARDIAC PACEMAKER INSERTION    . CARDIAC DEFIBRILLATOR PLACEMENT    . CERVICAL BIOPSY  W/ LOOP ELECTRODE EXCISION    . CHEMOTHERAPY    . CHOLECYSTECTOMY    . EYE SURGERY    . FOOT SURGERY Bilateral    Surgery on each foot for heal spurs  . FRACTURE SURGERY     leg- arm  . HYSTERECTOMY    . PR BIOPSY BONE TROCAR/NEEDLE DEEP N/A 06/23/2015   Procedure: Biopsy, Bone, Trocar Or Needle; Deep - lumbar;  Surgeon: Toribio Jama Clap, MD;  Location: MAIN OR [facility];  Service: Orthopedics  . PR COLONOSCOPY FLX DX W/COLLJ SPEC WHEN PFRMD N/A 12/08/2020   Procedure: COLONOSCOPY, FLEXIBLE, PROXIMAL TO SPLENIC FLEXURE; DIAGNOSTIC, W/WO COLLECTION SPECIMEN BY BRUSH OR WASH;  Surgeon: Sim Donna Magnuson, MD;  [facility]: [facility];  Service: Gastroenterology  . PR COLSC FLX W/RMVL OF TUMOR POLYP LESION SNARE TQ N/A 08/31/2014   Procedure: COLONOSCOPY FLEX; W/REMOV TUMOR/LES BY SNARE;  Surgeon: Myrick LULLA Keels, MD;  Location: [facility]EDURES [facility];  Service: Gastroenterology  .  PR INSJ/RPLCMT PERM DFB W/TRNSVNS LDS 1/DUAL CHMBR N/A 09/26/2017   Procedure: Implant Biventricular ICD System;  Surgeon: Donnice Garnette Lee, MD;  Location[facility]  [facility];  Service: Cardiology  . PR LAP, SURG PROCTECTOMY W COLOSTOMY N/A 01/25/2021   Procedure: LAPAROSCOPY, SURGICAL; PROCTECTOMY, COMPLETE, COMBINED ABDOMINOPERINEAL, WITH COLOSTOMY;  Surgeon: Darice Landry Fairy, MD;  Location: MAIN OR [facility];  Service: Surgical Oncology  . PR LAPAROSCOPY W TOT HYSTERECT UTERUS 250 GRAM OR LESS Midline 05/07/2013   Procedure: ROBOTIC LAPAROSCOPY, SURGICAL, WITH TOTAL HYSTERECTOMY, FOR UTERUS 250 G OR LESS;  Surgeon: Norleen ONEIDA Commons, MD;  Location: MAIN OR [facility];  Service: Gynecology Oncology  . PR PERQ VERT AGMNTJ CAVITY CRTJ UNI/BI CANNULJ EACH N/A 06/23/2015   Procedure: Percutaneous Vertebral Augmentation, Incl Cavity Creation Using Mech Device, 1 Vertebral Body, Unilateral Or Bilateral Cannulation, Incl Imaging Guidance; Each Addl Thoracic Or Lumbar Vertebral Body;  Surgeon: Toribio Jama Clap, MD;  Location: MAIN OR [facility];  Service: Orthopedics  . PR REMOVE PELVIS LYMPH NODES Midline 05/07/2013   Procedure: ROBOTIC PELVIC LYMPHADENECTOMY W/EXT ILIAC (SEPART PROC);  Surgeon: Norleen ONEIDA Commons, MD;  Location: MAIN OR [facility];  Service: Gynecology Oncology  . PR REPOSIT PREV IMPLANTED CARDIAC ELECTRODE N/A 12/12/2017   Procedure: Reposition LV Lead Only;  Surgeon: Donnice Garnette Lee, MD;  Location: [facility] EP;  Service: Cardiology  . PR REVISION OF COLOSTOMY,COMPLICATED N/A 02/14/2021   Procedure: REVIS COLOSTOMY; COMPLIC (SEPART PROC);  Surgeon: Darice Landry Fairy, MD;  Location: MAIN OR [facility];  Service: Surgical Oncology  . PR SIGMOIDOSCOPY W/ENDOSCOPIC US  EXAM N/A 12/28/2020   Procedure: KINGSTON SIDE, WITH ENDOSCOPIC ULTRASOUND EXAM;  Surgeon: Chauncey Glendia Pfeiffer, MD;  [facility];  Service: Gastroenterology  . RADIATION    . SKIN BIOPSY  [id number]  . SPINE SURGERY    .  TUBAL LIGATION    [3] Family History Problem Relation Age of Onset  . Hypertension Mother   . Diabetes Mother   . Cancer Mother   . Arthritis Mother   . Kidney disease Mother   . Stroke Mother   . Heart disease Mother   . Hypertension Father   . Diabetes Father   . Cancer Father   . Arthritis Father   . Liver disease Father   . Breast cancer Sister   . Heart disease Sister   . Diabetes Sister   . Hypertension Sister   . Heart attack Sister   . Cancer Sister        Lung and breast cancer  . Stroke Sister        2 strokes  . Heart disease Daughter   . Diabetes Son   . Severe sprains Neg Hx   . Anesthesia problems Neg Hx   . Bleeding Disorder Neg Hx   . Melanoma Neg Hx   . Basal cell carcinoma Neg Hx   . Squamous cell carcinoma Neg Hx   . Colon cancer Neg Hx   . Endometrial cancer Neg Hx   . Ovarian cancer Neg Hx    Badal, Merlynn MANIFOLD, MD Resident 03/15/24 [phone]

## 2024-03-15 ENCOUNTER — Other Ambulatory Visit: Payer: Self-pay | Admitting: Nurse Practitioner

## 2024-03-15 DIAGNOSIS — J4520 Mild intermittent asthma, uncomplicated: Secondary | ICD-10-CM

## 2024-03-15 NOTE — Consults (Addendum)
 Malignant Hematology Consult Note   Pertinent Oncology History:  2015: Endometrial cancer stage IIIc treated with curative intent with surgical resection plus Carbo/taxol and radiation (course c/b) complicated by neuropathy  2022: Clinical Stage IIA T3N0 but pathological Stage IIIC pT3N1b Rectal adenocarcinoma diagnosed on colonoscopy follow up for adenomatous polyps. Invading through muscularis propria into perirectal fat without lymph node involvement. Treated with surgery alone (APR on 01/25/21) due to prior XRT to the area as above and neuropathy from prior chemotherapy. CEA was never elevated with this diagnosis.  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 Interval development of soft tissue implants in the omentum and retroperitoneum, concerning for peritoneal carcinomatosis.  CT Chest/A/P 11/14/23 1.  Redemonstrated sequelae of prior APR with right lower quadrant end colostomy placement. Note made of a large mesenteric fat and bowel containing parastomal hernia. No associated bowel dilatation, wall thickening, or surrounding fat stranding/free fluid to suggest ischemia or vascular compromise. 2.  Multiple enlarging retroperitoneal and right common iliac chain lymph nodes (as described), highly suspicious for potential sites of nodal metastasis in this patient with reported history of prior rectal and endometrial cancer. Continued attention is recommended on short interval follow-up. 3.  Other chronic or incidental findings, as described within the body of the report.  PET/CT 11/28/23 -Intensely FDG avid lymphadenopathy  involving the right axillary, right retropectoral, porta hepatic, retroperitoneal, and pelvic lymph node stations suspicious for metastatic disease. Less avid bilateral cervical nodes may be reactive. -Status post abdominoperineal resection. There is a mildly FDG avid soft tissue nodule in the left ischio-anal fossa as well as mildly FDG-avid presacral soft tissue thickening, favored to reflect postsurgical change. Recommend attention on follow-up. -Status post hysterectomy without evidence of locally recurrent disease. -Large parastomal hernia of the prior right lower quadrant colostomy site containing fat and bowel. Multiple small bowel loops are noted to be apposed to the anterior abdominal wall, and are suspicious for adhesive disease.  Pathology from abdominal surgery 01/25/21 Rectum with anus and portion of posterior vaginal wall, abdominoperineal resection - Invasive rectal adenocarcinoma, moderately to poorly differentiated, with invasion through muscularis propria into mesorectal tissue (stage pT3) - Proximal, distal, and mesorectal margins are negative for dysplasia and carcinoma (closest approach 2 mm from area of focal defect in mesorectum) - Lymphovascular invasion, including extramural large venous invasion, and perineural invasion are present  - Three of nine lymph nodes positive for metastatic carcinoma (3/9, stage pN1b) - Two additional mesorectal tumor deposits identified     Assessment:  In order to be certain regarding diagnosis we would need a biopsy or paracentesis with sampling of peritoneal fluid for cytology and flow cytometry, however the below is my best assessment with the given data. The patient has declined further workup and wishes to focus on comfort and palliation which I think is very reasonable and her wishes are to be respected.  I was consulted from the malignant heme service, although I am fellowship trained in oncology so I offer a perspective on multiple  cancers below.  I would attribute her current peritoneal carcinomatosis to relapsed rectal cancer rather than follicular lymphoma for the following reasons:  Follicular lymphoma is not an aggressive malignancy and  it does not normally cause peritoneal carcinomatosis.  Concern was raised by outpatient hematologist regarding transformation of disease, which certainly can happen with follicular lymphoma, however no objective data supports this at this time We do have a PET CT with intense avidity of axillary LN from July 2025 Biopsy of axillary LN in Aug (the same time period) shows grade 1-2 follicular lymphoma - not transformation Her functional status decline in October, the reason treatment for follicular lymphoma was initiated rather than a watch and wait approach, could be attributed to the process that lead to her peritoneal carcinomatosis which was diagnosed Nov 2025. No workup was done in Oct 2025 due to the patient declining. Rectal cancer was treated with Surgery alone 3 years ago, rather than typically recommended total therapy with surgery, chemo and radiation. It seems that her oncologist also would have preferred to administer chemo and XRT as components of her therapy as well as surgery at that time, but was unable to do so due to prior XRT to the region and complications from that prior treatment She had high risk features with rectal cancer including tumor deposits and perineural invasion. Relapse within in the 5 years following treatment is more likely than not given this clinical scenario (<50% Disease free survival at 5 years)   For these reasons I would postulate that she has relapsed rectal cancer. As she declines tissue sampling and imaging we will not be able to definitively establish this diagnosis. My recommendations are the following:  Plan: - Consult Palliative Care to assist with pain control and discharge to hospice planning    Thank you for involving me with this  patient's care, Darryle HERO. Antonetta, MD, PhD Assistant Professor  Hematology [employer]  at [facility]

## 2024-03-15 NOTE — Consults (Signed)
 ------------------------------------------------------------------------------- Attestation signed by Pecot, Chad Victor, MD at 03/15/24 1948 I saw and evaluated the patient, participating in the key portions of the service. I reviewed the accompanying provider's note and I agree with their findings and plan.   Chad V. Pecot, M.D. Division of Hematology/Oncology  -------------------------------------------------------------------------------  Oncology Consult Note  Requesting Attending Physician:  Toribio JONELLE Bunker, MD Service Requesting Consult: Oncology/Hematology (MDE) Primary Oncologist: Dr. Dorn Hoes   Reason for Consult: New CT findings lymphoma versus rectal cancer progression  Assessment: Diana Bernard is a 78 y.o. woman with PMH of uterine cancer (2015), rectal cancer (2022), follicular lymphoma (2025) undergoing rituximab treatment who was admitted for nausea and vomiting. Oncology was consulted for evaluation of new CT findings.   2015: Endometrial cancer stage IIIc treated with curative intent with surgical resection plus Carbo/taxol and radiation (course c/b) complicated by neuropathy   2022: Clinical Stage IIA T3N0 but pathological Stage IIIC pT3N1b rectal adenocarcinoma diagnosed on colonoscopy follow up for adenomatous polyps. Invading through muscularis propria into perirectal fat without lymph node involvement. Treated with surgery alone (APR on 01/25/21) due to prior XRT to the area as above and neuropathy from prior chemotherapy. CEA was never elevated with this diagnosis.   2025:Grade 1-2 Follicular lymphoma with IGH/BCL2 rearrangement - R axillary LN biopsy proven 12/06/23 but flow not performed due to formalin fixation of sample. Started on rituximab 02/07/24 due to decline in functional status, last dose 11/7.   03/15/2024: Presented to the ED due to low-grade fever, malaise, nausea and vomiting. CT AP in comparison to CT AP 11/14/2023: significant interval  increase in abdominopelvic lymphadenopathy, predominantly involving the retroperitoneum. Interval development of soft tissue implants in the omentum and retroperitoneum, concerning for peritoneal carcinomatosis.   In order to differentiate between follicle lymphoma versus rectal carcinoma progression, we would need a biopsy or paracentesis with cytology. Patient has declined further workup and wishes to focus on comfort and quality of life.  Patient is seen by primary team, malignant heme team and medical oncology service, patient are very consistent and affirmative about her decision.   Recommendations:  -Recommend to consult palliative care and focus on comfort and quality of life. -Medical oncology will sign off.  This patient has been seen and discussed with Dr. Chad Pecot. These recommendations were discussed with the primary team.   Please contact the oncology team at [phone] with any further questions.  Babara Battles, MD PGY-4 Hematology/Oncology Fellow  -------------------------------------------------------------  HPI: Diana Bernard is a 78 y.o. woman with PMH of uterine cancer (2015), rectal cancer (2022), follicular lymphoma (2025) undergoing rituximab treatment and who is being seen at the request of Toribio JONELLE Bunker, MD for evaluation of disease progression on CT AP.   She has been experiencing fever and generalized malaise since Tuesday, with a recorded temperature of 101F at home. Over the past week, she has become increasingly weak and confused, with her granddaughters noting increased disorientation. She reports worsening abdominal pain that intensifies after eating, present for a couple of months but worsening in the last two weeks. This has led to a significant decrease in appetite, with minimal food intake, including a hamburger yesterday and some oatmeal and toast today. Nausea occurs when sitting up in certain positions.  When seen at bedside today, patient report her  abdominal pain has better controlled since admission, nausea has not completely improved, able to have small sips of water and soup.  Review of Systems: All other systems reviewed and negative  except as per HPI  Past Medical History[1]  Past Surgical History[2]  Family History[3]  Social History [4]  Allergies: is allergic to grass pollen-june grass standard.  Medications:  Meds:Scheduled Medications[5] Continuous Infusions:Infusions Meds[6] PRN Meds:.PRN Medications[7]  Objective:  Vitals: Temp:  [36.5 C (97.7 F)-37.2 C (99 F)] 36.5 C (97.7 F) Pulse:  [70-92] 75 SpO2 Pulse:  [64-96] 96 Resp:  [15-22] 19 BP: (105-139)/(49-74) 139/53 MAP (mmHg):  [72-85] 77 SpO2:  [95 %-100 %] 100 %  Physical Exam: BP 139/53   Pulse 75   Temp 36.5 C (97.7 F) (Oral)   Resp 19   Wt (!) 101.6 kg (224 lb)   SpO2 100%   BMI 43.75 kg/m   General appearance - alert, in no distress  Mental status - alert, oriented to person, place, and time  Eyes - pupils equal and reactive, extraocular eye movements intact  Nose - normal and patent, no erythema, discharge or polyps  Mouth - mucous membranes moist, pharynx normal without lesions  Neck - supple  Lymphatics - no palpable lymphadenopathy  Pulmonary -clear breathing sounds, no wheezing, no rhonchi. Cardiovascular-normal rate and rhythm, S1-S2 is heard, no murmur Gastrointestinal -Soft and diffusely tender to palpation, non-distended. RUQ colostomy bag with loose brown stool.  Neurological - alert, oriented, normal speech, no focal findings or movement disorder noted  Musculoskeletal - no joint tenderness, deformity or swelling  Extremities - peripheral pulses normal, no pedal edema, no clubbing or cyanosis  Skin -large decub ulcer  Karnofsky Performance Status: 40 - Disabled, requires special care and help  ECOG Performance Status: 3 - Capable of only limited selfcare, confined to bed or chair more than 50% of waking hours   Test  Results Lab Results  Component Value Date   WBC 5.7 03/15/2024   HGB 10.0 (L) 03/15/2024   HCT 29.6 (L) 03/15/2024   PLT 116 (L) 03/15/2024    Lab Results  Component Value Date   NA 144 03/15/2024   K 4.3 03/15/2024   K 4.3 03/15/2024   CL 107 03/15/2024   CO2 22.0 03/15/2024   BUN 22 03/15/2024   CREATININE 1.18 (H) 03/15/2024   GLU 87 03/15/2024   CALCIUM  8.6 (L) 03/15/2024   MG 2.4 03/15/2024   PHOS 2.5 03/15/2024    Lab Results  Component Value Date   BILITOT 0.3 03/15/2024   BILIDIR 0.10 03/15/2024   PROT 6.3 03/15/2024   ALBUMIN 3.0 (L) 03/15/2024   ALT 12 03/15/2024   AST 18 03/15/2024   AST 18 03/15/2024   ALKPHOS 77 03/15/2024    Lab Results  Component Value Date   LABPROT 13.1 (H) 05/06/2014   INR 1.03 07/27/2023   APTT 27.3 06/29/2021     Imaging: Radiology studies were personally reviewed             [1] Past Medical History: Diagnosis Date  . Allergic 2010  . Anemia   . Anxiety   . Arthritis   . Asthma (HHS-HCC)   . Cataract   . CHF (congestive heart failure) (CMS-HCC)   . Chronic diarrhea   . Chronic kidney disease   . COPD (chronic obstructive pulmonary disease) (CMS-HCC)   . Depression   . Diverticulitis   . DVT (deep venous thrombosis) (CMS-HCC) 2015   Left leg - post op  . Endometrial cancer (CMS-HCC) 2015   s/p hysterectomy, s/p chemotherapy and radiation treatment  . Fractures    humerus right  . GERD (gastroesophageal reflux disease)   .  Heart disease 2005  . Hypertension   . Joint pain   . Kyphosis   . Left bundle branch block 09/04/2017  . Lymphoma    (CMS-HCC)   . Obesity, Class III, BMI 40-49.9 (morbid obesity) (CMS-HCC)   . Rectal cancer    (CMS-HCC)   . Shoulder injury   . Sleep apnea   . Squamous cell skin cancer   [2] Past Surgical History: Procedure Laterality Date  . ATRIAL CARDIAC PACEMAKER INSERTION    . CARDIAC DEFIBRILLATOR PLACEMENT    . CERVICAL BIOPSY  W/ LOOP ELECTRODE EXCISION    .  CHEMOTHERAPY    . CHOLECYSTECTOMY    . EYE SURGERY    . FOOT SURGERY Bilateral    Surgery on each foot for heal spurs  . FRACTURE SURGERY     leg- arm  . HYSTERECTOMY    . PR BIOPSY BONE TROCAR/NEEDLE DEEP N/A 06/23/2015   Procedure: Biopsy, Bone, Trocar Or Needle; Deep - lumbar;  Surgeon: Toribio Jama Clap, MD;  Location: MAIN OR [facility];  Service: Orthopedics  . PR COLONOSCOPY FLX DX W/COLLJ SPEC WHEN PFRMD N/A 12/08/2020   Procedure: COLONOSCOPY, FLEXIBLE, PROXIMAL TO SPLENIC FLEXURE; DIAGNOSTIC, W/WO COLLECTION SPECIMEN BY BRUSH OR WASH;  Surgeon: Sim Donna Magnuson, MD;  Location: [facility];  Service: Gastroenterology  . PR COLSC FLX W/RMVL OF TUMOR POLYP LESION SNARE TQ N/A 08/31/2014   Procedure: COLONOSCOPY FLEX; W/REMOV TUMOR/LES BY SNARE;  Surgeon: Myrick LULLA Keels, MD;  Location: GI PROCEDURES [facility];  Service: Gastroenterology  . PR INSJ/RPLCMT PERM DFB W/TRNSVNS LDS 1/DUAL CHMBR N/A 09/26/2017   Procedure: Implant Biventricular ICD System;  Surgeon: Donnice Garnette Lee, MD;  Location: [facility] EP;  Service: Cardiology  . PR LAP, SURG PROCTECTOMY W COLOSTOMY N/A 01/25/2021   Procedure: LAPAROSCOPY, SURGICAL; PROCTECTOMY, COMPLETE, COMBINED ABDOMINOPERINEAL, WITH COLOSTOMY;  Surgeon: Darice Landry Fairy, MD;  Location: MAIN OR [facility];  Service: Surgical Oncology  . PR LAPAROSCOPY W TOT HYSTERECT UTERUS 250 GRAM OR LESS Midline 05/07/2013   Procedure: ROBOTIC LAPAROSCOPY, SURGICAL, WITH TOTAL HYSTERECTOMY, FOR UTERUS 250 G OR LESS;  Surgeon: Norleen ONEIDA Commons, MD;  Location: MAIN OR [facility];  Service: Gynecology Oncology  . PR PERQ VERT AGMNTJ CAVITY CRTJ UNI/BI CANNULJ EACH N/A 06/23/2015   Procedure: Percutaneous Vertebral Augmentation, Incl Cavity Creation Using Mech Device, 1 Vertebral Body, Unilateral Or Bilateral Cannulation, Incl Imaging Guidance; Each Addl Thoracic Or Lumbar Vertebral Body;  Surgeon: Toribio Jama Clap, MD;  Location: MA[facility];   Service: Orthopedics  . PR REMOVE PELVIS LYMPH NODES Midline 05/07/2013   Procedure: ROBOTIC PELVIC LYMPHADENECTOMY W/EXT ILIAC (SEPART PROC);  Surgeon: Norleen ONEIDA Commons, MD;  Location: MA[facility];  Service: Gynecology Oncology  . PR REPOSIT PREV IMPLANTED CARDIAC ELECTRODE N/A 12/12/2017   Procedure: Reposition LV Lead Only;  Surgeon: Donnice Garnette Lee, MD;  Location: [facility] EP;  Service: Cardiology  . PR REVISION OF COLOSTOMY,COMPLICATED N/A 02/14/2021   Procedure: REVIS COLOSTOMY; COMPLIC (SEPART PROC);  Surgeon: Darice Landry Fairy, MD;  Location: MAIN OR [facility];  Service: Surgical Oncology  . PR SIGMOIDOSCOPY W/ENDOSCOPIC US  EXAM N/A 12/28/2020   Procedure: KINGSTON SIDE, WITH ENDOSCOPIC ULTRASOUND EXAM;  Surgeon: Chauncey Glendia Pfeiffer, MD;  Location: [facility];  Service: Gastroenterology  . RADIATION    . SKIN BIOPSY  [id number]  . SPINE SURGERY    . TUBAL LIGATION    [3] Family History Problem Relation Age of Onset  . Hypertension Mother   .  Diabetes Mother   . Cancer Mother   . Arthritis Mother   . Kidney disease Mother   . Stroke Mother   . Heart disease Mother   . Hypertension Father   . Diabetes Father   . Cancer Father   . Arthritis Father   . Liver disease Father   . Breast cancer Sister   . Heart disease Sister   . Diabetes Sister   . Hypertension Sister   . Heart attack Sister   . Cancer Sister        Lung and breast cancer  . Stroke Sister        2 strokes  . Heart disease Daughter   . Diabetes Son   . Severe sprains Neg Hx   . Anesthesia problems Neg Hx   . Bleeding Disorder Neg Hx   . Melanoma Neg Hx   . Basal cell carcinoma Neg Hx   . Squamous cell carcinoma Neg Hx   . Colon cancer Neg Hx   . Endometrial cancer Neg Hx   . Ovarian cancer Neg Hx   [4] Social History Socioeconomic History  . Marital status: Widowed  Occupational History  . Occupation: Financial trader  Tobacco Use  . Smoking status: Never    Passive exposure:  Never  . Smokeless tobacco: Never  Vaping Use  . Vaping status: Never Used  Substance and Sexual Activity  . Alcohol use: No  . Drug use: No  . Sexual activity: Not Currently    Partners: Male    Birth control/protection: Abstinence  Other Topics Concern  . Exercise No  . Living Situation No  . Do you use sunscreen? Yes  . Tanning bed use? No  . Are you easily burned? Yes  . Excessive sun exposure? No  . Blistering sunburns? No   Social Drivers of Health   Food Insecurity: No Food Insecurity (01/13/2024)   Hunger Vital Sign   . Worried About Programme Researcher, Broadcasting/film/video in the Last Year: Never true   . Ran Out of Food in the Last Year: Never true  Tobacco Use: Low Risk (03/14/2024)   Patient History   . Smoking Tobacco Use: Never   . Smokeless Tobacco Use: Never   . Passive Exposure: Never  Transportation Needs: No Transportation Needs (01/13/2024)   PRAPARE - Transportation   . Lack of Transportation (Medical): No   . Lack of Transportation (Non-Medical): No  Housing: Low Risk (01/13/2024)   Housing   . Within the past 12 months, have you ever stayed: outside, in a car, in a tent, in an overnight shelter, or temporarily in someone else's home (i.e. couch-surfing)?: No   . Are you worried about losing your housing?: No  Utilities: Low Risk (01/13/2024)   Utilities   . Within the past 12 months, have you been unable to get utilities (heat, electricity) when it was really needed?: No  Interpersonal Safety: Not At Risk (03/14/2024)   Interpersonal Safety   . Unsafe Where You Currently Live: No   . Physically Hurt by Anyone: No   . Abused by Anyone: No  Financial Resource Strain: Low Risk (01/13/2024)   Overall Financial Resource Strain (CARDIA)   . Difficulty of Paying Living Expenses: Not very hard  Health Literacy: Medium Risk (01/13/2024)   Health Literacy   . : Rarely  Internet Connectivity: No Internet connectivity concern identified (01/13/2024)   Internet Connectivity   . Do  you have access to internet services: Yes   .  How do you connect to the internet: Personal Device at home   . Is your internet connection strong enough for you to watch video on your device without major problems?: Yes   . Do you have enough data to get through the month?: Yes   . Does at least one of the devices have a camera that you can use for video chat?: Yes  [5] . acetaminophen   1,000 mg Oral TID  . alendronate   70 mg Oral Q Sunday  . allopurinol   300 mg Oral Daily  . cyanocobalamin   500 mcg Oral Daily  . diclofenac sodium  2 g Topical QID  . fluticasone  furoate-vilanterol  1 puff Inhalation Daily (RT)  . gabapentin   300 mg Oral Q AM   And  . gabapentin   300 mg Oral Daily with lunch   And  . gabapentin   900 mg Oral Nightly  . lidocaine   1 patch Transdermal Daily  . [Provider Hold] lisinopril   40 mg Oral QAM  . [Provider Hold] magnesium oxide  400 mg Oral Daily  . [Provider Hold] metoPROLOL  succinate  50 mg Oral Daily  . montelukast   10 mg Oral Nightly  . nystatin   500,000 Units Oral QID  . pantoprazole  20 mg Oral Daily before breakfast  . piperacillin-tazobactam (ZOSYN) IV (intermittent)  3.375 g Intravenous Q6H  . sodium chloride  10 mL Intravenous BID  . sodium chloride  10 mL Intravenous BID  . sodium chloride  10 mL Intravenous BID  . vancomycin  1,250 mg Intravenous Q24H  . venlafaxine   37.5 mg Oral Daily  [6] . sodium chloride    [7] emollient combination no.92, ipratropium-albuterol , loperamide, loperamide, ondansetron , oxyCODONE  **OR** oxyCODONE , prochlorperazine

## 2024-03-16 ENCOUNTER — Telehealth: Payer: Self-pay | Admitting: Internal Medicine

## 2024-03-16 ENCOUNTER — Telehealth: Payer: Self-pay | Admitting: Nurse Practitioner

## 2024-03-16 ENCOUNTER — Telehealth: Payer: Self-pay

## 2024-03-16 NOTE — Telephone Encounter (Signed)
 per daughter, she is back in hospital, cancer has spread. will be going under hospice care.Diana Bernard

## 2024-03-16 NOTE — Telephone Encounter (Signed)
 [facility] called [phone] that they received palliative care referral as per pt daughter that pt is going with hospice they will cal us  back after they clarification

## 2024-03-16 NOTE — Telephone Encounter (Signed)
 Left patient vm to schedule visit with [id number]

## 2024-03-16 NOTE — Care Plan (Signed)
 Shift Summary oxyCODONE  was administered for abdominal pain, resulting in a decrease in pain score.  Skin integrity was supported with pressure-redistributing mattress, absorbent pads, and limited adhesive use, with no deterioration noted.  Fall prevention strategies were consistently maintained, including bed alarms, low bed, and scheduled toileting, with no falls or injuries documented.  Started on continuous oxygen  monitoring. Patient on CPAP at night. Overall, comfort was improved and safety maintained throughout the shift.   Skin Health and Integrity: Redness and a dry wound were noted on the buttocks, but skin integrity was maintained with absorbent pads, limited adhesive use, and a pressure-redistributing mattress throughout the shift; skin checks and protection measures were consistently applied.   Absence of Hospital-Acquired Illness or Injury: No new hospital-acquired illness or injury was documented, and CMV PCR and antibody screen results were negative.   Optimal Comfort and Wellbeing: Abdominal pain was present and described as aching, intermittent, and chronic, with a gradual worsening trend; pain decreased from 6 to 5 after oxyCODONE  administration.   Absence of Fall and Fall-Related Injury: Fall prevention interventions were maintained, including bed alarms, low bed positioning, frequent visual checks, and scheduled toileting, with no falls or injuries reported.   Improved Ability to Complete Activities of Daily Living: Mobility remained slightly limited and assistance was required for most ADLs except feeding and oral care, with no change in level of independence during the shift.

## 2024-03-19 ENCOUNTER — Encounter: Admitting: Physician Assistant

## 2024-03-23 ENCOUNTER — Ambulatory Visit: Admitting: Nurse Practitioner

## 2024-04-06 ENCOUNTER — Ambulatory Visit: Admitting: Nurse Practitioner

## 2024-04-30 DEATH — deceased

## 2024-05-13 ENCOUNTER — Ambulatory Visit: Admitting: Nurse Practitioner

## 2024-12-09 ENCOUNTER — Ambulatory Visit: Admitting: Nurse Practitioner
